# Patient Record
Sex: Male | Born: 1940 | Race: White | Hispanic: No | Marital: Married | Smoking: Never smoker
Health system: Southern US, Community
[De-identification: ages and names within clinical notes are randomized; demographics above are authoritative.]

## PROBLEM LIST (undated history)

## (undated) DIAGNOSIS — F32A Depression, unspecified: Secondary | ICD-10-CM

## (undated) DIAGNOSIS — R011 Cardiac murmur, unspecified: Secondary | ICD-10-CM

## (undated) DIAGNOSIS — F209 Schizophrenia, unspecified: Secondary | ICD-10-CM

## (undated) DIAGNOSIS — F419 Anxiety disorder, unspecified: Secondary | ICD-10-CM

## (undated) DIAGNOSIS — I350 Nonrheumatic aortic (valve) stenosis: Secondary | ICD-10-CM

## (undated) DIAGNOSIS — E785 Hyperlipidemia, unspecified: Secondary | ICD-10-CM

## (undated) DIAGNOSIS — F329 Major depressive disorder, single episode, unspecified: Secondary | ICD-10-CM

## (undated) DIAGNOSIS — I2510 Atherosclerotic heart disease of native coronary artery without angina pectoris: Secondary | ICD-10-CM

## (undated) HISTORY — DX: Nonrheumatic aortic (valve) stenosis: I35.0

## (undated) HISTORY — PX: LIPOMA EXCISION: SHX5283

## (undated) HISTORY — PX: FOOT SURGERY: SHX648

## (undated) HISTORY — DX: Hyperlipidemia, unspecified: E78.5

## (undated) HISTORY — DX: Atherosclerotic heart disease of native coronary artery without angina pectoris: I25.10

## (undated) HISTORY — PX: EYE SURGERY: SHX253

---
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
## 2008-06-24 ENCOUNTER — Emergency Department (HOSPITAL_COMMUNITY): Admission: EM | Admit: 2008-06-24 | Discharge: 2008-06-24 | Payer: Self-pay | Admitting: Family Medicine

## 2017-05-27 DIAGNOSIS — R011 Cardiac murmur, unspecified: Secondary | ICD-10-CM | POA: Diagnosis not present

## 2017-05-27 DIAGNOSIS — R29898 Other symptoms and signs involving the musculoskeletal system: Secondary | ICD-10-CM | POA: Diagnosis not present

## 2017-05-27 DIAGNOSIS — F429 Obsessive-compulsive disorder, unspecified: Secondary | ICD-10-CM | POA: Diagnosis not present

## 2017-05-27 DIAGNOSIS — R5383 Other fatigue: Secondary | ICD-10-CM | POA: Diagnosis not present

## 2017-05-27 DIAGNOSIS — D7589 Other specified diseases of blood and blood-forming organs: Secondary | ICD-10-CM | POA: Diagnosis not present

## 2017-05-27 DIAGNOSIS — R69 Illness, unspecified: Secondary | ICD-10-CM | POA: Diagnosis not present

## 2017-06-18 DIAGNOSIS — R69 Illness, unspecified: Secondary | ICD-10-CM | POA: Diagnosis not present

## 2017-07-02 ENCOUNTER — Emergency Department (HOSPITAL_COMMUNITY): Payer: Medicare HMO

## 2017-07-02 ENCOUNTER — Encounter (HOSPITAL_COMMUNITY): Payer: Self-pay | Admitting: Emergency Medicine

## 2017-07-02 ENCOUNTER — Emergency Department (HOSPITAL_COMMUNITY)
Admission: EM | Admit: 2017-07-02 | Discharge: 2017-07-02 | Disposition: A | Discharge status: Home / Self Care | Payer: Medicare HMO | Attending: Emergency Medicine | Admitting: Emergency Medicine

## 2017-07-02 DIAGNOSIS — Z79899 Other long term (current) drug therapy: Secondary | ICD-10-CM | POA: Diagnosis not present

## 2017-07-02 DIAGNOSIS — K5641 Fecal impaction: Secondary | ICD-10-CM | POA: Diagnosis not present

## 2017-07-02 DIAGNOSIS — K5903 Drug induced constipation: Secondary | ICD-10-CM | POA: Diagnosis not present

## 2017-07-02 DIAGNOSIS — K5900 Constipation, unspecified: Secondary | ICD-10-CM | POA: Diagnosis not present

## 2017-07-02 HISTORY — DX: Major depressive disorder, single episode, unspecified: F32.9

## 2017-07-02 HISTORY — DX: Anxiety disorder, unspecified: F41.9

## 2017-07-02 HISTORY — DX: Depression, unspecified: F32.A

## 2017-07-02 MED ORDER — SORBITOL 70 % SOLN
960.0000 mL | TOPICAL_OIL | Freq: Once | ORAL | Status: AC
  Administered 2017-07-02: 960 mL via RECTAL
  Filled 2017-07-02: qty 240

## 2017-07-02 MED ORDER — POLYETHYLENE GLYCOL 3350 17 G PO PACK: 17.0000 g | PACK | Freq: Every day | ORAL | 1 refills | Status: DC

## 2017-07-02 MED ORDER — DOCUSATE SODIUM 100 MG PO CAPS: 100.0000 mg | ORAL_CAPSULE | Freq: Two times a day (BID) | ORAL | 0 refills | Status: DC

## 2017-07-02 NOTE — ED Provider Notes (Signed)
[facility] DEPT Provider Note   CSN: [id number] Arrival date & time: 07/02/17  1035     History   Chief Complaint Chief Complaint  Patient presents with  . Fecal Impaction    HPI Neil [name] is a 76 y.o. male.  HPI Patient reports about a month ago he started taking amitriptyline and    X. he reports since then he's been having problems with constipation for about 2 weeks. He reports that he has tried to have a bowel movement and been "stopped up". Last week he wore gloves and was able to self disimpact and go on to have a very large bowel movement. He reports he has not been able to do that this time and he cannot give the stool to dislodge. He reports he has rectal pain but no abdominal pain, no nausea, no vomiting. He reports he is urinating normally. No incontinence. No pain or burning. Patient denies being on any blood thinners. Past Medical History:  Diagnosis Date  . Anxiety   . Depression     There are no active problems to display for this patient.   Past Surgical History:  Procedure Laterality Date  . FOOT SURGERY Left        Home Medications    Prior to Admission medications   Medication Sig Start Date End Date Taking? Authorizing Provider  amitriptyline (ELAVIL) 25 MG tablet Take 25 mg by mouth at bedtime.   Yes [provider]  Multiple Vitamin (MULTIVITAMIN WITH MINERALS) TABS tablet Take 1 tablet by mouth daily.   Yes [provider]  omega-3 acid ethyl esters (LOVAZA) 1 g capsule Take 1 g by mouth daily.   Yes [provider]  perphenazine (TRILAFON) 2 MG tablet Take 3 mg by mouth 2 (two) times daily.   Yes [provider]  docusate sodium (COLACE) 100 MG capsule Take 1 capsule (100 mg total) by mouth every 12 (twelve) hours. 07/02/17   Charlesetta Shanks, MD  polyethylene glycol (MIRALAX / GLYCOLAX) packet Take 17 g by mouth daily. 07/02/17   Charlesetta Shanks, MD    Family History No family history on  file.  Social History Social History  Substance Use Topics  . Smoking status: Never Smoker  . Smokeless tobacco: Not on file  . Alcohol use No     Allergies   Aspirin; Sulfur; and Tetanus toxoids   Review of Systems Review of Systems 10 Systems reviewed and are negative for acute change except as noted in the HPI.   Physical Exam Updated Vital Signs BP 137/68 (BP Location: Right Arm)   Pulse 90   Temp 98.6 F (37 C) (Oral)   Resp 18   Ht 5\' 5"  (1.651 m)   Wt 63 kg (139 lb)   SpO2 99%   BMI 23.13 kg/m   Physical Exam  Constitutional: He is oriented to person, place, and time. He appears well-developed and well-nourished.  HENT:  Head: Normocephalic and atraumatic.  Eyes: Conjunctivae are normal.  Neck: Neck supple.  Cardiovascular: Normal rate and regular rhythm.   Murmur heard. 3\6 systolic ejection murmur.  Pulmonary/Chest: Effort normal and breath sounds normal. No respiratory distress.  Abdominal: Soft. Bowel sounds are normal. He exhibits no distension. There is no tenderness.  Genitourinary:  Genitourinary Comments: Normal perianal tissues. Rectum is full with firm, impacted stool.  Musculoskeletal: Normal range of motion. He exhibits no edema.  Neurological: He is alert and oriented to person, place, and time. No cranial nerve  deficit. He exhibits normal muscle tone. Coordination normal.  Skin: Skin is warm and dry.  Psychiatric: He has a normal mood and affect.  Nursing note and vitals reviewed.    ED Treatments / Results  Labs (all labs ordered are listed, but only abnormal results are displayed) Labs Reviewed - No data to display  EKG  EKG Interpretation None       Radiology Dg Abd Acute W/chest  Result Date: 07/02/2017 CLINICAL DATA:  Constipation for 2-3 days. EXAM: DG ABDOMEN ACUTE W/ 1V CHEST COMPARISON:  None. FINDINGS: There is no evidence of dilated bowel loops or free intraperitoneal air. Moderate amount of formed stool throughout  the colon. No radiopaque calculi or other significant radiographic abnormality is seen. Heart size and mediastinal contours are within normal limits. Calcific atherosclerotic disease of the aorta. Both lungs are clear. IMPRESSION: Constipation. Nonobstructive bowel gas pattern. Calcific atherosclerotic disease of the aorta. Electronically Signed   By: Fidela Salisbury M.D.   On: 07/02/2017 12:37    Procedures Fecal disimpaction Date/Time: 07/02/2017 2:10 PM Performed by: Charlesetta Shanks Authorized by: Charlesetta Shanks  Consent: Verbal consent obtained. Comments: Patient was first digitally disimpacted with surgilube. Hard stool removed. Subsequently, smog enema instilled. Second manual disimpaction required. Again hard stool removed. At that time patient was able to be transitioned to a bedside commode and passed very large stool. It was small amount of bright red bleeding after disimpaction. No brisk bleeding or dripping of blood. No bleeding after completion of full bowel movement. Patient felt much improved. He tolerated procedure well.    (including critical care time)  Medications Ordered in ED Medications  sorbitol, milk of mag, mineral oil, glycerin (SMOG) enema (960 mLs Rectal Given 07/02/17 1313)     Initial Impression / Assessment and Plan / ED Course  I have reviewed the triage vital signs and the nursing notes.  Pertinent labs & imaging results that were available during my care of the patient were reviewed by me and considered in my medical decision making (see chart for details).     Final Clinical Impressions(s) / ED Diagnoses   Final diagnoses:  Fecal impaction ([place])  Drug-induced constipation  Patient counseled on ongoing management of constipation with daily Colace and MiraLAX every third day if he is not having regular bowel movements. patient is counseled on following up with PCP. Patient is alert and appropriate. He shows no signs of mental confusion or psychosis.  He is cognitively sharp. He advises he is placed on the combination of amitriptyline and Trilafon for severe depression he was having. He reports is a combination he had been on previously as well and done well. I did advise the patient these medications do have a significant side effect profile and are not in common use. At this time, I do not appreciate patient is showing signs of acute side effects. I did suggest he consider an evaluation with psychiatry as well to evaluate his medications and needs for management of depression, which is not currently clinically evident at this emergency department visit.  New Prescriptions New Prescriptions   DOCUSATE SODIUM (COLACE) 100 MG CAPSULE    Take 1 capsule (100 mg total) by mouth every 12 (twelve) hours.   POLYETHYLENE GLYCOL (MIRALAX / GLYCOLAX) PACKET    Take 17 g by mouth daily.     Charlesetta Shanks, MD 07/02/17 [phone]

## 2017-07-02 NOTE — Discharge Instructions (Signed)
Take Colace twice daily every day. Start taking MiraLAX every third day if you have not had a normal bowel movement. Continue the MiraLAX on a daily basis until you are having normal bowel movements again and then returned to Colace only.

## 2017-07-02 NOTE — ED Notes (Signed)
Pt ambulatory and independent at discharge.  Verbalized understanding of discharge instructions 

## 2017-07-02 NOTE — ED Notes (Signed)
Pt reports constipation, LBM-3 days ago.  States he takes meds that cause constipation.  Denies passing flatus.  Bowel sounds audible in all quadrants.  Denies any n/v and has not tried OTC stool softeners or laxatives.  Pt reports rectal pain, has the urge to have a BM but is unable to move his bowels.

## 2017-07-02 NOTE — ED Triage Notes (Signed)
Pt states that his anxiety medication constipates him and he has been able to disimpact himself and use suppositories at home in the past 3 weeks but feels impacted at this time. Alert and oriented.

## 2017-07-09 DIAGNOSIS — R69 Illness, unspecified: Secondary | ICD-10-CM | POA: Diagnosis not present

## 2017-07-09 DIAGNOSIS — F428 Other obsessive-compulsive disorder: Secondary | ICD-10-CM | POA: Diagnosis not present

## 2017-07-09 DIAGNOSIS — R011 Cardiac murmur, unspecified: Secondary | ICD-10-CM | POA: Diagnosis not present

## 2017-07-09 DIAGNOSIS — K5903 Drug induced constipation: Secondary | ICD-10-CM | POA: Diagnosis not present

## 2017-07-29 DIAGNOSIS — R69 Illness, unspecified: Secondary | ICD-10-CM | POA: Diagnosis not present

## 2017-08-19 DIAGNOSIS — R69 Illness, unspecified: Secondary | ICD-10-CM | POA: Diagnosis not present

## 2017-09-02 DIAGNOSIS — R69 Illness, unspecified: Secondary | ICD-10-CM | POA: Diagnosis not present

## 2017-09-30 DIAGNOSIS — R69 Illness, unspecified: Secondary | ICD-10-CM | POA: Diagnosis not present

## 2017-10-07 DIAGNOSIS — R69 Illness, unspecified: Secondary | ICD-10-CM | POA: Diagnosis not present

## 2017-10-21 DIAGNOSIS — H524 Presbyopia: Secondary | ICD-10-CM | POA: Diagnosis not present

## 2017-10-28 DIAGNOSIS — R69 Illness, unspecified: Secondary | ICD-10-CM | POA: Diagnosis not present

## 2017-10-29 DIAGNOSIS — E782 Mixed hyperlipidemia: Secondary | ICD-10-CM | POA: Diagnosis not present

## 2017-10-29 DIAGNOSIS — M20039 Swan-neck deformity of unspecified finger(s): Secondary | ICD-10-CM | POA: Diagnosis not present

## 2017-10-29 DIAGNOSIS — N400 Enlarged prostate without lower urinary tract symptoms: Secondary | ICD-10-CM | POA: Diagnosis not present

## 2017-10-29 DIAGNOSIS — Z0000 Encounter for general adult medical examination without abnormal findings: Secondary | ICD-10-CM | POA: Diagnosis not present

## 2017-10-29 DIAGNOSIS — R011 Cardiac murmur, unspecified: Secondary | ICD-10-CM | POA: Diagnosis not present

## 2017-10-29 DIAGNOSIS — G4700 Insomnia, unspecified: Secondary | ICD-10-CM | POA: Diagnosis not present

## 2017-10-29 DIAGNOSIS — R7303 Prediabetes: Secondary | ICD-10-CM | POA: Diagnosis not present

## 2017-10-29 DIAGNOSIS — Z1211 Encounter for screening for malignant neoplasm of colon: Secondary | ICD-10-CM | POA: Diagnosis not present

## 2017-10-29 DIAGNOSIS — R69 Illness, unspecified: Secondary | ICD-10-CM | POA: Diagnosis not present

## 2017-10-29 DIAGNOSIS — Z125 Encounter for screening for malignant neoplasm of prostate: Secondary | ICD-10-CM | POA: Diagnosis not present

## 2017-11-25 DIAGNOSIS — R69 Illness, unspecified: Secondary | ICD-10-CM | POA: Diagnosis not present

## 2017-12-23 DIAGNOSIS — R69 Illness, unspecified: Secondary | ICD-10-CM | POA: Diagnosis not present

## 2018-01-31 DIAGNOSIS — H6092 Unspecified otitis externa, left ear: Secondary | ICD-10-CM | POA: Diagnosis not present

## 2018-02-17 DIAGNOSIS — R69 Illness, unspecified: Secondary | ICD-10-CM | POA: Diagnosis not present

## 2018-03-17 DIAGNOSIS — R69 Illness, unspecified: Secondary | ICD-10-CM | POA: Diagnosis not present

## 2018-03-31 DIAGNOSIS — R69 Illness, unspecified: Secondary | ICD-10-CM | POA: Diagnosis not present

## 2018-04-14 DIAGNOSIS — R69 Illness, unspecified: Secondary | ICD-10-CM | POA: Diagnosis not present

## 2018-06-17 DIAGNOSIS — R69 Illness, unspecified: Secondary | ICD-10-CM | POA: Diagnosis not present

## 2018-08-11 DIAGNOSIS — R69 Illness, unspecified: Secondary | ICD-10-CM | POA: Diagnosis not present

## 2018-09-22 DIAGNOSIS — R69 Illness, unspecified: Secondary | ICD-10-CM | POA: Diagnosis not present

## 2018-10-06 ENCOUNTER — Ambulatory Visit (INDEPENDENT_AMBULATORY_CARE_PROVIDER_SITE_OTHER): Payer: Medicare HMO | Admitting: Psychiatry

## 2018-10-06 DIAGNOSIS — G2401 Drug induced subacute dyskinesia: Secondary | ICD-10-CM

## 2018-10-06 DIAGNOSIS — F29 Unspecified psychosis not due to a substance or known physiological condition: Secondary | ICD-10-CM

## 2018-10-06 DIAGNOSIS — R69 Illness, unspecified: Secondary | ICD-10-CM | POA: Diagnosis not present

## 2018-10-06 MED ORDER — PERPHENAZINE 2 MG PO TABS: 5.0000 mg | ORAL_TABLET | Freq: Every day | ORAL | 0 refills | Status: DC

## 2018-10-06 MED ORDER — MIRTAZAPINE 15 MG PO TABS: 15.0000 mg | ORAL_TABLET | Freq: Every day | ORAL | 1 refills | Status: DC

## 2018-10-06 NOTE — Progress Notes (Addendum)
We don't have records from Dr. Sharalyn Ink. Or. Dr. Deboraha Sprang Med Check  Patient ID: Neil [name],  MRN: [id number]  PCP: Antony Contras, MD  Date of Evaluation: 10/06/2018 Time spent:20 minutes   HISTORY/CURRENT STATUS: HPI patient is a 77 year old white male last seen 08/11/2018.  He carries diagnosis of psychosis NOS, TD.  He was having some varying depression that time.  Denies suicidal thoughts.  No hallucinations or delusions. Some paranoia as he always feels guilty about seeing an illusion 1 yr ago(lady at church) for 1 time only. He knows the guilt is irrational. Still with mild depression.  No symptoms of tardive kinesia.  Individual Medical History/ Review of Systems: Changes? :No  Allergies: Aspirin; Sulfur; and Tetanus toxoids  Current Medications:  Current Outpatient Medications:  .  docusate sodium (COLACE) 100 MG capsule, Take 1 capsule (100 mg total) by mouth every 12 (twelve) hours., Disp: 60 capsule, Rfl: 0 .  mirtazapine (REMERON) 15 MG tablet, Take 1 tablet (15 mg total) by mouth at bedtime., Disp: 30 tablet, Rfl: 1 .  Multiple Vitamin (MULTIVITAMIN WITH MINERALS) TABS tablet, Take 1 tablet by mouth daily., Disp: , Rfl:  .  omega-3 acid ethyl esters (LOVAZA) 1 g capsule, Take 1 g by mouth daily., Disp: , Rfl:  .  perphenazine (TRILAFON) 2 MG tablet, Take 2.5 tablets (5 mg total) by mouth at bedtime., Disp: 75 tablet, Rfl: 0 .  Valbenazine Tosylate (INGREZZA) 40 MG CAPS, Take 40 mg by mouth daily., Disp: , Rfl:  .  amitriptyline (ELAVIL) 25 MG tablet, Take 25 mg by mouth at bedtime., Disp: , Rfl:  .  hydrOXYzine (ATARAX/VISTARIL) 25 MG tablet, Take 25 mg by mouth 3 (three) times daily., Disp: , Rfl:  .  polyethylene glycol (MIRALAX / GLYCOLAX) packet, Take 17 g by mouth daily. (Patient not taking: Reported on 10/06/2018), Disp: 14 each, Rfl: 1 Medication Side Effects: None  Family Medical/ Social History: Changes? No  MENTAL HEALTH EXAM:  There  were no vitals taken for this visit.There is no height or weight on file to calculate BMI.  General Appearance: Casual  Eye Contact:  Good  Speech:  Normal Rate  Volume:  Normal  Mood:  Anxious  Affect:  Appropriate  Thought Process:  Linear  Orientation:  Full (Time, Place, and Person)  Thought Content: Logical   Suicidal Thoughts:  No  Homicidal Thoughts:  no  Memory:  normal  Judgement:  Fair  Insight:  Fair  Psychomotor Activity:  Normal  Concentration:  Concentration: Good  Recall:  Good  Fund of Knowledge: Good  Language: Good  Akathisia:  No  AIMS (if indicated): negative  Assets:  Desire for Improvement  ADL's:  Intact  Cognition: WNL  Prognosis:  Good    DIAGNOSES:    ICD-10-CM   1. 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 Tardive dyskinesia G24.01     RECOMMENDATIONS: discussed pt with Dr. Clovis Pu. Feel his prolonged guilt is psychosis. Pt to increase his triafon from 2mg   2 day to 2 mg 21/2/d. Continue remeron 15mg  hs. Continue Ingrezza 40mg /day. States is mailed to  [employer] in 1 month.  Comer Locket, PA-C

## 2018-11-03 ENCOUNTER — Ambulatory Visit: Payer: Medicare HMO | Admitting: Psychiatry

## 2018-11-03 DIAGNOSIS — G4700 Insomnia, unspecified: Secondary | ICD-10-CM | POA: Diagnosis not present

## 2018-11-03 DIAGNOSIS — Z0000 Encounter for general adult medical examination without abnormal findings: Secondary | ICD-10-CM | POA: Diagnosis not present

## 2018-11-03 DIAGNOSIS — E782 Mixed hyperlipidemia: Secondary | ICD-10-CM | POA: Diagnosis not present

## 2018-11-03 DIAGNOSIS — R011 Cardiac murmur, unspecified: Secondary | ICD-10-CM | POA: Diagnosis not present

## 2018-11-03 DIAGNOSIS — R7303 Prediabetes: Secondary | ICD-10-CM | POA: Diagnosis not present

## 2018-11-03 DIAGNOSIS — Z1211 Encounter for screening for malignant neoplasm of colon: Secondary | ICD-10-CM | POA: Diagnosis not present

## 2018-11-03 DIAGNOSIS — N400 Enlarged prostate without lower urinary tract symptoms: Secondary | ICD-10-CM | POA: Diagnosis not present

## 2018-11-03 DIAGNOSIS — M20039 Swan-neck deformity of unspecified finger(s): Secondary | ICD-10-CM | POA: Diagnosis not present

## 2018-11-03 DIAGNOSIS — R69 Illness, unspecified: Secondary | ICD-10-CM | POA: Diagnosis not present

## 2018-11-03 DIAGNOSIS — Z125 Encounter for screening for malignant neoplasm of prostate: Secondary | ICD-10-CM | POA: Diagnosis not present

## 2018-11-04 ENCOUNTER — Ambulatory Visit: Payer: Medicare HMO | Admitting: Psychiatry

## 2018-11-04 DIAGNOSIS — G2401 Drug induced subacute dyskinesia: Secondary | ICD-10-CM

## 2018-11-04 DIAGNOSIS — F22 Delusional disorders: Secondary | ICD-10-CM | POA: Diagnosis not present

## 2018-11-04 DIAGNOSIS — R69 Illness, unspecified: Secondary | ICD-10-CM | POA: Diagnosis not present

## 2018-11-04 MED ORDER — MIRTAZAPINE 15 MG PO TABS: 15.0000 mg | ORAL_TABLET | Freq: Every day | ORAL | 1 refills | Status: DC

## 2018-11-04 MED ORDER — PERPHENAZINE 2 MG PO TABS: 2.0000 mg | ORAL_TABLET | Freq: Three times a day (TID) | ORAL | 1 refills | Status: DC

## 2018-11-04 NOTE — Progress Notes (Signed)
[facility]  Patient ID: Neil [name],  MRN: [id number]  PCP: Antony Contras, MD  Date of Evaluation: 11/04/2018 Time spent:20 minutes  Chief Complaint:   HISTORY/CURRENT STATUS: HPI patient last seen 10/06/2018.  Carries a diagnosis of psychosis, tardive dyskinesia.  His psychosis (paranoia) is only slightly better.  Takes the form of guilt feeling of something that happened a year ago.  He denies delusions or hallucinations.  He denies symptoms of tardive dyskinesia. He has some depression but no suicidal thoughts.  He has anxiety at night and during the day.  He goes to bed at 7 pM and wakes up at 3 AM.  Wakes up with anxiety.  Individual Medical History/ Review of Systems: Changes? :No   Allergies: Aspirin; Sulfur; and Tetanus toxoids  Current Medications:  Current Outpatient Medications:  .  docusate sodium (COLACE) 100 MG capsule, Take 1 capsule (100 mg total) by mouth every 12 (twelve) hours., Disp: 60 capsule, Rfl: 0 .  mirtazapine (REMERON) 15 MG tablet, Take 1 tablet (15 mg total) by mouth at bedtime., Disp: 30 tablet, Rfl: 1 .  Valbenazine Tosylate (INGREZZA) 40 MG CAPS, Take 40 mg by mouth daily., Disp: , Rfl:  .  amitriptyline (ELAVIL) 25 MG tablet, Take 25 mg by mouth at bedtime., Disp: , Rfl:  .  hydrOXYzine (ATARAX/VISTARIL) 25 MG tablet, Take 25 mg by mouth 3 (three) times daily., Disp: , Rfl:  .  Multiple Vitamin (MULTIVITAMIN WITH MINERALS) TABS tablet, Take 1 tablet by mouth daily., Disp: , Rfl:  .  omega-3 acid ethyl esters (LOVAZA) 1 g capsule, Take 1 g by mouth daily., Disp: , Rfl:  .  perphenazine (TRILAFON) 2 MG tablet, Take 1 tablet (2 mg total) by mouth 3 (three) times daily., Disp: 90 tablet, Rfl: 1 .  polyethylene glycol (MIRALAX / GLYCOLAX) packet, Take 17 g by mouth daily. (Patient not taking: Reported on 10/06/2018), Disp: 14 each, Rfl: 1 Medication Side Effects: none   Family Medical/ Social History: Changes? no  MENTAL HEALTH  EXAM:  There were no vitals taken for this visit.There is no height or weight on file to calculate BMI.  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 Tardive dyskinesia G24.01     Receiving Psychotherapy: No    RECOMMENDATIONS: We will increase his Trilafon to 2 mg 3 a day.  He gets [place] from [place] and Trilafon and Remeron from [facility].  Will follow his anxiety and his scale.  Recheck 6 weeks.   Comer Locket, PA-C

## 2018-11-07 ENCOUNTER — Other Ambulatory Visit: Payer: Self-pay

## 2018-11-08 ENCOUNTER — Other Ambulatory Visit: Payer: Self-pay

## 2018-11-18 ENCOUNTER — Telehealth: Payer: Self-pay | Admitting: Psychiatry

## 2018-11-18 ENCOUNTER — Other Ambulatory Visit: Payer: Self-pay | Admitting: Psychiatry

## 2018-11-18 NOTE — Telephone Encounter (Signed)
Patient wants med for anxiety. Has used vistaril in past. Called his home but no answering machine. I will have nurse to call and make sure no problems with v istaril in past.

## 2018-11-21 ENCOUNTER — Telehealth: Payer: Self-pay | Admitting: Psychiatry

## 2018-11-21 ENCOUNTER — Other Ambulatory Visit: Payer: Self-pay | Admitting: Psychiatry

## 2018-11-21 MED ORDER — BUSPIRONE HCL 10 MG PO TABS: ORAL_TABLET | ORAL | 0 refills | Status: DC

## 2018-11-21 NOTE — Progress Notes (Signed)
eprescribed buspar 10mg   1/2 day for a week, then 1 a day.

## 2018-11-21 NOTE — Telephone Encounter (Signed)
Pt requesting something for anxiety. Will start buspar 10mg /day   1/2 tab daily for a week , then 1 tab daily.

## 2018-12-16 ENCOUNTER — Other Ambulatory Visit: Payer: Self-pay

## 2018-12-16 NOTE — Telephone Encounter (Signed)
Opened chart in error.

## 2018-12-22 ENCOUNTER — Ambulatory Visit (INDEPENDENT_AMBULATORY_CARE_PROVIDER_SITE_OTHER): Payer: Medicare HMO | Admitting: Psychiatry

## 2018-12-22 DIAGNOSIS — R69 Illness, unspecified: Secondary | ICD-10-CM | POA: Diagnosis not present

## 2018-12-22 DIAGNOSIS — F259 Schizoaffective disorder, unspecified: Secondary | ICD-10-CM | POA: Diagnosis not present

## 2018-12-22 MED ORDER — PERPHENAZINE 2 MG PO TABS: ORAL_TABLET | ORAL | 0 refills | Status: DC

## 2018-12-22 MED ORDER — BUSPIRONE HCL 10 MG PO TABS: ORAL_TABLET | ORAL | 0 refills | Status: DC

## 2018-12-22 MED ORDER — MIRTAZAPINE 15 MG PO TABS: ORAL_TABLET | ORAL | 0 refills | Status: DC

## 2018-12-22 NOTE — Progress Notes (Signed)
[facility]  Patient ID: Neil [name],  MRN: [id number]  PCP: Antony Contras, MD  Date of Evaluation: 12/22/2018 Time spent:30 minutes  Chief Complaint:   HISTORY/CURRENT STATUS: HPI patient was last seen 11/19.  He was still having paranoia at the time.  We increased his Trilafon. Patient states wife feels he is doing worse having a lot of paranoia.  The patient describes paranoia/guilt.  There was an incident about a year ago that bothered the patient in church and he has not been able to let go of it.  He feels more people are aware of it.  He denies delusions or hallucinations.Marland Kitchen He is having anxiety. He does have some finger tremors in his left hand off and on during the day lasting 20 seconds apiece.  Individual Medical History/ Review of Systems: Changes? :No   Allergies: Aspirin; Sulfur; and Tetanus toxoids  Current Medications:  Current Outpatient Medications:  .  busPIRone (BUSPAR) 10 MG tablet, 1 a day, Disp: 30 tablet, Rfl: 0 .  mirtazapine (REMERON) 15 MG tablet, 2 at night, Disp: 60 tablet, Rfl: 0 .  perphenazine (TRILAFON) 2 MG tablet, 2 tabs bid, Disp: 120 tablet, Rfl: 0 .  Valbenazine Tosylate (INGREZZA) 40 MG CAPS, Take 40 mg by mouth daily., Disp: , Rfl:  .  amitriptyline (ELAVIL) 25 MG tablet, Take 25 mg by mouth at bedtime., Disp: , Rfl:  .  docusate sodium (COLACE) 100 MG capsule, Take 1 capsule (100 mg total) by mouth every 12 (twelve) hours., Disp: 60 capsule, Rfl: 0 .  hydrOXYzine (ATARAX/VISTARIL) 25 MG tablet, Take 25 mg by mouth 3 (three) times daily., Disp: , Rfl:  .  Multiple Vitamin (MULTIVITAMIN WITH MINERALS) TABS tablet, Take 1 tablet by mouth daily., Disp: , Rfl:  .  omega-3 acid ethyl esters (LOVAZA) 1 g capsule, Take 1 g by mouth daily., Disp: , Rfl:  .  polyethylene glycol (MIRALAX / GLYCOLAX) packet, Take 17 g by mouth daily. (Patient not taking: Reported on 10/06/2018), Disp: 14 each, Rfl: 1 Medication Side Effects: none  Family  Medical/ Social History: Changes? No  MENTAL HEALTH EXAM:  There were no vitals taken for this visit.There is no height or weight on file to calculate BMI.  General Appearance: Casual  Eye Contact:  Good  Speech:  Normal Rate at times pauses for 5 seconds  Volume:  Normal  Mood:  Depressed guilty  Affect:  Appropriate  Thought Process:  Linear  Orientation:  Full (Time, Place, and Person)  Thought Content: Paranoid Ideation   Suicidal Thoughts:  No  Homicidal Thoughts:  No  Memory:  WNL  Judgement:  fair  Insight:  Fair  Psychomotor Activity:  Normal  Concentration:  Concentration: Good  Recall:  Good  Fund of Knowledge: Good  Language: Good  Assets:  Desire for Improvement  ADL's:  Intact  Cognition: WNL  Prognosis:  Fair  No symptoms of tar dive dyskinesia.  DIAGNOSES:    ICD-10-CM   1. Schizoaffective disorder, unspecified type ([place]) F25.9 perphenazine (TRILAFON) 2 MG tablet    Receiving Psychotherapy: no declines counseling   RECOMMENDATIONS: We will increase the patient's Remeron from 15 mg at night to 30 mg at night.  In addition to help with anxiety should help with his insomnia.  Increase his Trilafon to 2 mg 4 a day.  He can take these 2 in the morning and 2 at night.  BuSpar 10 mg a day.  Continue Ingrezza 40 mg a day.  Comer Locket, PA-C

## 2018-12-26 ENCOUNTER — Other Ambulatory Visit: Payer: Self-pay

## 2018-12-26 ENCOUNTER — Telehealth: Payer: Self-pay | Admitting: Psychiatry

## 2018-12-26 MED ORDER — MIRTAZAPINE 30 MG PO TABS: ORAL_TABLET | ORAL | 1 refills | Status: DC

## 2018-12-26 NOTE — Telephone Encounter (Signed)
Pt worried about constipation if increases trilafon to 2 mg 2 bid. Encouraged to try as may help with paranoia. Also advised to see neurologist for blanking out spells. Pt states he probably won't see a neurologist

## 2018-12-26 NOTE — Telephone Encounter (Signed)
PATIENT WANTS TO DISCUSS AN INCREASE ON PERHENAZINE [phone]

## 2018-12-28 ENCOUNTER — Other Ambulatory Visit: Payer: Self-pay

## 2018-12-28 MED ORDER — MIRTAZAPINE 30 MG PO TABS: ORAL_TABLET | ORAL | 1 refills | Status: DC

## 2019-01-02 ENCOUNTER — Telehealth: Payer: Self-pay | Admitting: Psychiatry

## 2019-01-02 NOTE — Telephone Encounter (Signed)
Increase fluids and fiber foods.

## 2019-01-02 NOTE — Telephone Encounter (Signed)
Please advise 

## 2019-01-02 NOTE — Telephone Encounter (Signed)
Pt called he is having constipation issues with meds he's taking. Please advise.

## 2019-01-03 NOTE — Telephone Encounter (Signed)
Given information and some suggestions. Instructed not to adjust his medications, keep at same doses as prescribed. Follow up on 02/06

## 2019-01-03 NOTE — Telephone Encounter (Signed)
Left voice mail to call back 

## 2019-01-06 ENCOUNTER — Telehealth: Payer: Self-pay

## 2019-01-06 NOTE — Telephone Encounter (Signed)
Prior Auth approved by [employer] for [employer] 12/12/2018-12/14/2019.

## 2019-01-19 ENCOUNTER — Ambulatory Visit: Payer: Medicare HMO | Admitting: Psychiatry

## 2019-01-23 ENCOUNTER — Ambulatory Visit: Payer: Medicare HMO | Admitting: Psychiatry

## 2019-01-23 DIAGNOSIS — F200 Paranoid schizophrenia: Secondary | ICD-10-CM | POA: Diagnosis not present

## 2019-01-23 DIAGNOSIS — R69 Illness, unspecified: Secondary | ICD-10-CM | POA: Diagnosis not present

## 2019-01-23 MED ORDER — OLANZAPINE 5 MG PO TABS: ORAL_TABLET | ORAL | 1 refills | Status: DC

## 2019-01-23 NOTE — Progress Notes (Signed)
[facility]  Patient ID: Neil [name],  MRN: [id number]  PCP: Antony Contras, MD  Date of Evaluation: 01/23/2019 Time spent:20 minutes  Chief Complaint:   HISTORY/CURRENT STATUS: HPI patient was seen 1 month ago.  He was having paranoid ideations.incident that happened at church a year ago.  We increased his Remeron to 30 mg a day increased his Trilafon 2 mg to 4 a day continue BuSpar.  Continue Ingrezza. Currently the patient continues to have nausea.  It stems around an event happened at church a year ago.  Where she is thought he saw an illusion of the lady.  He is still worried about that.  Wants to know if he send.  He did have a little bit hallucinations while seeing blocks.   Individual Medical History/ Review of Systems: Changes? :No   Allergies: Aspirin; Sulfur; and Tetanus toxoids  Current Medications:  Current Outpatient Medications:  .  mirtazapine (REMERON) 30 MG tablet, One at hs, Disp: 30 tablet, Rfl: 1 .  perphenazine (TRILAFON) 2 MG tablet, 2 tabs bid, Disp: 120 tablet, Rfl: 0 .  amitriptyline (ELAVIL) 25 MG tablet, Take 25 mg by mouth at bedtime., Disp: , Rfl:  .  busPIRone (BUSPAR) 10 MG tablet, 1 a day (Patient not taking: Reported on 01/23/2019), Disp: 30 tablet, Rfl: 0 .  docusate sodium (COLACE) 100 MG capsule, Take 1 capsule (100 mg total) by mouth every 12 (twelve) hours., Disp: 60 capsule, Rfl: 0 .  hydrOXYzine (ATARAX/VISTARIL) 25 MG tablet, Take 25 mg by mouth 3 (three) times daily., Disp: , Rfl:  .  Multiple Vitamin (MULTIVITAMIN WITH MINERALS) TABS tablet, Take 1 tablet by mouth daily., Disp: , Rfl:  .  omega-3 acid ethyl esters (LOVAZA) 1 g capsule, Take 1 g by mouth daily., Disp: , Rfl:  .  polyethylene glycol (MIRALAX / GLYCOLAX) packet, Take 17 g by mouth daily. (Patient not taking: Reported on 10/06/2018), Disp: 14 each, Rfl: 1 .  Valbenazine Tosylate (INGREZZA) 40 MG CAPS, Take 40 mg by mouth daily., Disp: , Rfl:  Medication Side  Effects: none  Family Medical/ Social History: Changes? No  MENTAL HEALTH EXAM:  There were no vitals taken for this visit.There is no height or weight on file to calculate BMI.  General Appearance: Casual  Eye Contact:  Fair  Speech:  Clear and Coherent  Volume:  Normal  Mood:  Anxious and Depressed  Affect:  Appropriate  Thought Process:  Linear  Orientation:  Full (Time, Place, and Person)  Thought Content: at times during the interview he paused when asked a question  Suicidal Thoughts:  No  Homicidal Thoughts:  No  Memory:  WNL  Judgement:  Poor  Insight:  Lacking  Psychomotor Activity:  Normal  Concentration:  Concentration: Good  Recall:  Good  Fund of Knowledge: Good  Language: Good  Assets:  Desire for Improvement  ADL's:  Intact  Cognition: WNL  Prognosis:  Fair    DIAGNOSES:    ICD-10-CM   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  We will taper him off his Trilafon 2 mg a week.  He is only taken 4 mg now.  Start him on Zyprexa.  Zyprexa 5 mg one half a day for a week and then 1 a day.  He is to stop the Remeron.  Requests that he can call to make another appt. His finances  are tight   [employer], PA-C

## 2019-01-24 ENCOUNTER — Telehealth: Payer: Self-pay | Admitting: Psychiatry

## 2019-01-24 NOTE — Telephone Encounter (Signed)
Problem wioth tremors on risperdal.

## 2019-01-24 NOTE — Telephone Encounter (Signed)
Spoke with wife. Pt no better but not worsening Guilty, an d paranoid Will follow sx. On zyprexa

## 2019-01-30 ENCOUNTER — Other Ambulatory Visit: Payer: Self-pay | Admitting: Psychiatry

## 2019-02-14 ENCOUNTER — Other Ambulatory Visit: Payer: Self-pay | Admitting: Psychiatry

## 2019-02-21 ENCOUNTER — Ambulatory Visit: Payer: Medicare HMO | Admitting: Psychiatry

## 2019-03-02 ENCOUNTER — Ambulatory Visit: Payer: Medicare HMO | Admitting: Psychiatry

## 2019-03-02 ENCOUNTER — Other Ambulatory Visit: Payer: Self-pay

## 2019-03-02 ENCOUNTER — Encounter: Payer: Self-pay | Admitting: Psychiatry

## 2019-03-02 DIAGNOSIS — F333 Major depressive disorder, recurrent, severe with psychotic symptoms: Secondary | ICD-10-CM | POA: Diagnosis not present

## 2019-03-02 DIAGNOSIS — F411 Generalized anxiety disorder: Secondary | ICD-10-CM | POA: Diagnosis not present

## 2019-03-02 DIAGNOSIS — R69 Illness, unspecified: Secondary | ICD-10-CM | POA: Diagnosis not present

## 2019-03-02 DIAGNOSIS — G2401 Drug induced subacute dyskinesia: Secondary | ICD-10-CM | POA: Diagnosis not present

## 2019-03-02 DIAGNOSIS — K5904 Chronic idiopathic constipation: Secondary | ICD-10-CM

## 2019-03-02 MED ORDER — SERTRALINE HCL 25 MG PO TABS: ORAL_TABLET | ORAL | 1 refills | Status: DC

## 2019-03-02 NOTE — Patient Instructions (Signed)
Increase olanzapine  5mg   to 1 1/2 tablets  Stop mirtazapine  Start sertraline 1/2 tablet daily for 1 week then 1 daily for anxiety and depression

## 2019-03-02 NOTE — Progress Notes (Signed)
[name] [id number] [birth year] 78 y.o.  Subjective:   Patient ID:  Neil [name] is a 78 y.o. (DOB 1941-08-19) male.  Chief Complaint:  Chief Complaint  Patient presents with  . Follow-up    Medication Management  . Depression  . 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  He was tapered off Trilafon 4 mg and started on olanzapine 5.  Mirtazapine was stopped.  He was still having paranoid thoughts at that visit.  He was complaining of nausea.  Lack of hope for getting better emotionally.  Anytime I have a moral decision to make it bothers me.  I might make the wrong one.  Irrational guilt.  When I get on something it's repetitive but they switch every so often.  Lately it's when I go to church I feel like the pastor's aware of his situation and it bothers me.  He can't read patient's thoughts.  Things pastor said.  Wife think's it's false guilt.  Sx going on for 18 mos.  Happened one other time 15 years ago.  Trilafon helped at the time.  Sometimes afraid. Down is even worse than the anxiety.  Sleeping lots and hard time getting up for awhile, not just since olanzapine.  No SE, better than the perphenazine but not the best.  Emotionally a little better. Lost weight over time and appetite a little better.  Has heard an inner voice twice at church and has seen sensual kind of manifestation and he heard this thougts to himself, she wants people to look at her and then "let's make that happen".  Then saw 2 blocks and thought if I get the blocks to line up then I could make it happen.  Felt real joy momentarily.  Saw the woman return to her seat..  Happened 18 mos ago and started the current problem.  Hx constipation with meds.  Past Psychiatric Medication Trials: Mirtazapine, olanzapine 5, perphenazine, buspirone, Ingrezza, amitriptyline 25, risperidone 2 tremors  Review of Systems:   Review of Systems  Gastrointestinal: Positive for constipation.  Neurological: Positive for tremors and weakness.  Psychiatric/Behavioral: Positive for decreased concentration, dysphoric mood and sleep disturbance. Negative for agitation, behavioral problems, confusion and self-injury. The patient is nervous/anxious. The patient is not hyperactive.     Medications: I have reviewed the patient's current medications.  Current Outpatient Medications  Medication Sig Dispense Refill  . docusate sodium (COLACE) 100 MG capsule Take 1 capsule (100 mg total) by mouth every 12 (twelve) hours. 60 capsule 0  . mirtazapine (REMERON) 30 MG tablet TAKE 1 TABLET BY MOUTH AT BEDTIME (Patient taking differently: 15 mg. TAKE 1 TABLET BY MOUTH AT BEDTIME) 90 tablet 0  . Multiple Vitamin (MULTIVITAMIN WITH MINERALS) TABS tablet Take 1 tablet by mouth daily.    Marland Kitchen OLANZapine (ZYPREXA) 5 MG tablet TAKE 1/2 TABLET EVERY DAY FOR 7 DAYS THEN TAKE 1 TABLET EVERY DAY (Patient taking differently: No sig reported) 90 tablet 0  . omega-3 acid ethyl esters (LOVAZA) 1 g capsule Take 1 g by mouth daily.    Minus Liberty Tosylate (INGREZZA) 40 MG CAPS Take 40 mg by mouth daily.    . polyethylene glycol (MIRALAX / GLYCOLAX) packet Take 17 g by mouth daily. (Patient not taking: Reported on 10/06/2018) 14 each 1  . sertraline (ZOLOFT) 25 MG tablet 1/2 tablet daily for 1 week then 1 tablet  daily 30 tablet 1   No current facility-administered medications for this visit.     Medication Side Effects: None  Allergies:  Allergies  Allergen Reactions  . Aspirin Swelling and Other (See Comments)    Reaction:  Tongue swelling   . Sulfur Other (See Comments)    Reaction:  Unknown   . Tetanus Toxoids Swelling and Other (See Comments)    Reaction:  Arm swelling    Past Medical History:  Diagnosis Date  . Anxiety   . Depression     History reviewed. No pertinent family history.  Social History   Socioeconomic History   . Marital status: Married    Spouse name: Not on file  . Number of children: Not on file  . Years of education: Not on file  . Highest education level: Not on file  Occupational History  . Not on file  Social Needs  . Financial resource strain: Not on file  . Food insecurity:    Worry: Not on file    Inability: Not on file  . Transportation needs:    Medical: Not on file    Non-medical: Not on file  Tobacco Use  . Smoking status: Never Smoker  . Smokeless tobacco: Never Used  Substance and Sexual Activity  . Alcohol use: No  . Drug use: Not on file  . Sexual activity: Not on file  Lifestyle  . Physical activity:    Days per week: Not on file    Minutes per session: Not on file  . Stress: Not on file  Relationships  . Social connections:    Talks on phone: Not on file    Gets together: Not on file    Attends religious service: Not on file    Active member of club or organization: Not on file    Attends meetings of clubs or organizations: Not on file    Relationship status: Not on file  . Intimate partner violence:    Fear of current or ex partner: Not on file    Emotionally abused: Not on file    Physically abused: Not on file    Forced sexual activity: Not on file  Other Topics Concern  . Not on file  Social History Narrative  . Not on file    Past Medical History, Surgical history, Social history, and Family history were reviewed and updated as appropriate.   Please see review of systems for further details on the patient's review from today.   Objective:   Physical Exam:  There were no vitals taken for this visit.  Physical Exam Constitutional:      Appearance: Normal appearance.  Neurological:     Mental Status: He is lethargic.     Motor: Weakness present.     Gait: Gait normal.     Comments: Slow gait.  Decreased arm swing  Psychiatric:        Attention and Perception: He is attentive. He does not perceive auditory or visual hallucinations.         Mood and Affect: Mood is anxious and depressed.        Speech: Speech is delayed.        Behavior: Behavior is slowed.        Thought Content: Thought content is paranoid. Thought content does not include homicidal or suicidal ideation.        Cognition and Memory: Cognition is impaired.     Comments: Psychomotor retardation Fair insight and judgment.  Lab Review:  No results found for: NA, K, CL, CO2, GLUCOSE, BUN, CREATININE, CALCIUM, PROT, ALBUMIN, AST, ALT, ALKPHOS, BILITOT, GFRNONAA, GFRAA  No results found for: WBC, RBC, HGB, HCT, PLT, MCV, MCH, MCHC, RDW, LYMPHSABS, MONOABS, EOSABS, BASOSABS  No results found for: POCLITH, LITHIUM   No results found for: PHENYTOIN, PHENOBARB, VALPROATE, CBMZ   .res Assessment: Plan:    Severe recurrent major depression with psychotic features, mood-congruent (HCC)  Generalized anxiety disorder  Tardive dyskinesia  Chronic idiopathic constipation   Greater than 50% of face to face time with patient was spent on counseling and coordination of care. We discussed Patient has severe recurrent major depression with psychotic features.  There are also elements of OCD.  He is paranoid and severely depressed and anxious.  He is tolerating the olanzapine better than the perphenazine.  He is frustrated with his lack of progress over the last 18 months.  Discussed his depression.  We will start an antidepressant.  Specifically start a serotonin antidepressant which should also help with anxiety and any obsessiveness that is a feature.  He should not need the mirtazapine while taking the olanzapine as olanzapine will adequately address sleep and appetite issues.  Increase olanzapine  5mg   to 1 1/2 tablets  Stop mirtazapine  Start sertraline 1/2 tablet daily for 1 week then 1 daily for anxiety and depression  Continue Ingrezza for TD.  Discussed side effects of each medication.  Discussed potential metabolic side effects associated with atypical  antipsychotics, as well as potential risk for movement side effects. Advised pt to contact office if movement side effects occur.   Disc constipation: Constipation management 1.  Loss of water 2.  Powdered fiber supplement such as MiraLAX, Citrucel, etc. preferably with a meal 3.  2 stool softeners a day 4.  Senokot table daily 5.  Milk of magnesia or magnesium tablets if needed  This appt was 30 mins.  FU 6 weeks.  Lynder Parents, MD, DFAPA     Future Appointments  Date Time Provider [place]  04/13/2019 11:00 AM Cottle, Billey Co., MD [facility] None    No orders of the defined types were placed in this encounter.     -------------------------------

## 2019-03-26 ENCOUNTER — Other Ambulatory Visit: Payer: Self-pay | Admitting: Psychiatry

## 2019-04-13 ENCOUNTER — Ambulatory Visit: Payer: Medicare HMO | Admitting: Psychiatry

## 2019-04-27 ENCOUNTER — Other Ambulatory Visit: Payer: Self-pay

## 2019-04-27 ENCOUNTER — Other Ambulatory Visit: Payer: Self-pay | Admitting: Psychiatry

## 2019-04-27 ENCOUNTER — Encounter: Payer: Self-pay | Admitting: Psychiatry

## 2019-04-27 ENCOUNTER — Ambulatory Visit: Payer: Medicare HMO | Admitting: Psychiatry

## 2019-04-27 DIAGNOSIS — F411 Generalized anxiety disorder: Secondary | ICD-10-CM | POA: Diagnosis not present

## 2019-04-27 DIAGNOSIS — G2401 Drug induced subacute dyskinesia: Secondary | ICD-10-CM

## 2019-04-27 DIAGNOSIS — F333 Major depressive disorder, recurrent, severe with psychotic symptoms: Secondary | ICD-10-CM | POA: Diagnosis not present

## 2019-04-27 DIAGNOSIS — R69 Illness, unspecified: Secondary | ICD-10-CM | POA: Diagnosis not present

## 2019-04-27 MED ORDER — SERTRALINE HCL 25 MG PO TABS: ORAL_TABLET | ORAL | 0 refills | Status: DC

## 2019-04-27 MED ORDER — OLANZAPINE 10 MG PO TABS: 10.0000 mg | ORAL_TABLET | Freq: Every day | ORAL | 1 refills | Status: DC

## 2019-04-27 NOTE — Progress Notes (Signed)
Neil [name] [id number] [birth year] 78 y.o.   Virtual Visit via Telephone Note  I connected with pt by telephone and verified that I am speaking with the correct person using two identifiers.   I discussed the limitations, risks, security and privacy concerns of performing an evaluation and management service by telephone and the availability of in person appointments. I also discussed with the patient that there may be a patient responsible charge related to this service. The patient expressed understanding and agreed to proceed.  I discussed the assessment and treatment plan with the patient. The patient was provided an opportunity to ask questions and all were answered. The patient agreed with the plan and demonstrated an understanding of the instructions.   The patient was advised to call back or seek an in-person evaluation if the symptoms worsen or if the condition fails to improve as anticipated.  I provided 30 minutes of non-face-to-face time during this encounter. The call started at 1145 and ended at 1215. The patient was located at home and the provider was located office.   Subjective:   Patient ID:  Neil [name] is a 78 y.o. (DOB 1941-07-07) male.  Chief Complaint:  Chief Complaint  Patient presents with  . Follow-up    Medication Management  . Depression  . Anxiety    HPI [name] presents today for follow-up of psychosis, depression, and anxiety  Last visit March 02, 2019.  The following med changes were made: Increase olanzapine  5mg   to 1 1/2 tablets Stop mirtazapine Start sertraline 25 mg tablet 1/2 tablet daily for 1 week then 1 daily for anxiety and depression Continue Ingrezza for TD.  CO change in bite with teeth and he things it's SE Ingrezza.  No pain involved.  Has tongue movements without it.  No benefit noted with med changes. Pt reports that mood is Anxious, Depressed and Hopeless and describes anxiety as Severe. Anxiety symptoms include:  rumination and obsessing as noted.  Stays in be a lot bc of depression. Pt reports sleeps excessively and 10 hours. Pt reports that appetite is decreased. Had lost 15 #.  Weight about 125#  Pt reports that energy is poor and loss of interest or pleasure in usual activities, poor motivation and withdrawn from usual activities. Concentration is down slightly. Suicidal thoughts:  denied by patient. No history of suicide attempts.  Symptoms continue as noted last time as following.  Lack of hope for getting better emotionally.  Anytime I have a moral decision to make it bothers me.  I might make the wrong one.  Irrational guilt.  When I get on something it's repetitive but they switch every so often.  Lately it's when I go to church I feel like the pastor's aware of his situation and it bothers me.  He can't read patient's thoughts.  Things pastor said.  Wife think's it's false guilt.  Sx going on for 18 mos.  Happened one other time 15 years ago.  Trilafon helped at the time.  Sometimes afraid. Down is even worse than the anxiety.  Sleeping lots and hard time getting up for awhile, not just since olanzapine.  No SE, better than the perphenazine but not the best.  Emotionally a little better. Lost weight over time and appetite a little better.  Has heard an inner voice twice at church and has seen sensual kind of manifestation and he heard this thougts to himself, she wants people to look at her and then "  let's make that happen".  Then saw 2 blocks and thought if I get the blocks to line up then I could make it happen.  Felt real joy momentarily.  Saw the woman return to her seat..  Happened 18 mos ago and started the current problem.  Hx constipation with meds resolved.  Past Psychiatric Medication Trials: Mirtazapine, olanzapine 5, perphenazine, buspirone, Ingrezza, amitriptyline 25, risperidone 2 tremors  Review of Systems:  Review of Systems  Gastrointestinal: Negative for constipation.  Neurological:  Positive for tremors and weakness.  Psychiatric/Behavioral: Positive for decreased concentration, dysphoric mood and sleep disturbance. Negative for agitation, behavioral problems, confusion and self-injury. The patient is nervous/anxious. The patient is not hyperactive.     Medications: I have reviewed the patient's current medications.  Current Outpatient Medications  Medication Sig Dispense Refill  . docusate sodium (COLACE) 100 MG capsule Take 1 capsule (100 mg total) by mouth every 12 (twelve) hours. 60 capsule 0  . Multiple Vitamin (MULTIVITAMIN WITH MINERALS) TABS tablet Take 1 tablet by mouth daily.    Marland Kitchen OLANZapine (ZYPREXA) 5 MG tablet TAKE 1/2 TABLET EVERY DAY FOR 7 DAYS THEN TAKE 1 TABLET EVERY DAY (Patient taking differently: Take 7.5 mg by mouth at bedtime. ) 90 tablet 0  . omega-3 acid ethyl esters (LOVAZA) 1 g capsule Take 1 g by mouth daily.    . sertraline (ZOLOFT) 25 MG tablet TAKE 1/2 TABLET DAILY FOR 1 WEEK THEN 1 TABLET DAILY (Patient taking differently: Take 25 mg by mouth at bedtime. ) 90 tablet 0  . Valbenazine Tosylate (INGREZZA) 40 MG CAPS Take 40 mg by mouth daily.     No current facility-administered medications for this visit.     Medication Side Effects: None  Allergies:  Allergies  Allergen Reactions  . Aspirin Swelling and Other (See Comments)    Reaction:  Tongue swelling   . Sulfur Other (See Comments)    Reaction:  Unknown   . Tetanus Toxoids Swelling and Other (See Comments)    Reaction:  Arm swelling    Past Medical History:  Diagnosis Date  . Anxiety   . Depression     History reviewed. No pertinent family history.  Social History   Socioeconomic History  . Marital status: Married    Spouse name: Not on file  . Number of children: Not on file  . Years of education: Not on file  . Highest education level: Not on file  Occupational History  . Not on file  Social Needs  . Financial resource strain: Not on file  . Food insecurity:     Worry: Not on file    Inability: Not on file  . Transportation needs:    Medical: Not on file    Non-medical: Not on file  Tobacco Use  . Smoking status: Never Smoker  . Smokeless tobacco: Never Used  Substance and Sexual Activity  . Alcohol use: No  . Drug use: Not on file  . Sexual activity: Not on file  Lifestyle  . Physical activity:    Days per week: Not on file    Minutes per session: Not on file  . Stress: Not on file  Relationships  . Social connections:    Talks on phone: Not on file    Gets together: Not on file    Attends religious service: Not on file    Active member of club or organization: Not on file    Attends meetings of clubs or organizations: Not on  file    Relationship status: Not on file  . Intimate partner violence:    Fear of current or ex partner: Not on file    Emotionally abused: Not on file    Physically abused: Not on file    Forced sexual activity: Not on file  Other Topics Concern  . Not on file  Social History Narrative  . Not on file    Past Medical History, Surgical history, Social history, and Family history were reviewed and updated as appropriate.   Please see review of systems for further details on the patient's review from today.   Objective:   Physical Exam:  There were no vitals taken for this visit.  Physical Exam Neurological:     Mental Status: He is alert and oriented to person, place, and time.     Cranial Nerves: No dysarthria.  Psychiatric:        Attention and Perception: Attention normal.        Mood and Affect: Mood is anxious and depressed.        Speech: Speech normal.        Behavior: Behavior is cooperative.        Thought Content: Thought content is paranoid. Thought content is not delusional. Thought content does not include homicidal or suicidal ideation. Thought content does not include homicidal or suicidal plan.        Cognition and Memory: Cognition and memory normal.        Judgment: Judgment  normal.     Comments: Insight fair. Still ruminating and obsessive with paranoid delusions as noted.     Lab Review:  No results found for: NA, K, CL, CO2, GLUCOSE, BUN, CREATININE, CALCIUM, PROT, ALBUMIN, AST, ALT, ALKPHOS, BILITOT, GFRNONAA, GFRAA  No results found for: WBC, RBC, HGB, HCT, PLT, MCV, MCH, MCHC, RDW, LYMPHSABS, MONOABS, EOSABS, BASOSABS  No results found for: POCLITH, LITHIUM   No results found for: PHENYTOIN, PHENOBARB, VALPROATE, CBMZ   .res Assessment: Plan:    Severe recurrent major depression with psychotic features, mood-congruent (HCC)  Generalized anxiety disorder  Tardive dyskinesia   Greater than 50% of face to face time with patient was spent on counseling and coordination of care. We discussed Patient has severe recurrent major depression with psychotic features.  There are also elements of OCD.  He is paranoid and severely depressed and anxious.  He is tolerating the olanzapine better than the perphenazine.  He is frustrated with his lack of progress over the last 18 months.  Discussed his depression.  We need to further increase his medication because of inadequate response.  Increase olanzapine  5mg   to 10 mg daily.  Increase sertraline to 50 mg or 2 of 25 mg tablets.  Continue Ingrezza for TD.  If not improvement in 4 weeks call back and we will increase sertraline again.  Disc SE each.  Discussed side effects of each medication.  Discussed potential metabolic side effects associated with atypical antipsychotics, as well as potential risk for movement side effects. Advised pt to contact office if movement side effects occur.   Constipation resolved.  FU 8 weeks.  Lynder Parents, MD, DFAPA     No future appointments.  No orders of the defined types were placed in this encounter.     -------------------------------

## 2019-05-26 ENCOUNTER — Other Ambulatory Visit: Payer: Self-pay | Admitting: Psychiatry

## 2019-05-30 ENCOUNTER — Other Ambulatory Visit: Payer: Self-pay

## 2019-05-30 ENCOUNTER — Telehealth: Payer: Self-pay | Admitting: Psychiatry

## 2019-05-30 MED ORDER — SERTRALINE HCL 50 MG PO TABS: ORAL_TABLET | ORAL | 2 refills | Status: DC

## 2019-05-30 NOTE — Telephone Encounter (Signed)
Pt called to request refill on Sertraline. Ran out early due to dose change. [facility] on file

## 2019-05-30 NOTE — Telephone Encounter (Signed)
Updated rx sent to [facility]

## 2019-05-30 NOTE — Telephone Encounter (Signed)
error 

## 2019-06-02 ENCOUNTER — Telehealth: Payer: Self-pay | Admitting: Psychiatry

## 2019-06-02 NOTE — Telephone Encounter (Signed)
error 

## 2019-06-14 ENCOUNTER — Other Ambulatory Visit: Payer: Self-pay

## 2019-06-14 MED ORDER — INGREZZA 40 MG PO CAPS: 40.0000 mg | ORAL_CAPSULE | Freq: Every day | ORAL | 5 refills | Status: DC

## 2019-06-15 ENCOUNTER — Ambulatory Visit: Payer: Medicare HMO | Admitting: Psychiatry

## 2019-06-15 ENCOUNTER — Encounter: Payer: Self-pay | Admitting: Psychiatry

## 2019-06-15 ENCOUNTER — Other Ambulatory Visit: Payer: Self-pay

## 2019-06-15 DIAGNOSIS — G2401 Drug induced subacute dyskinesia: Secondary | ICD-10-CM

## 2019-06-15 DIAGNOSIS — F333 Major depressive disorder, recurrent, severe with psychotic symptoms: Secondary | ICD-10-CM | POA: Diagnosis not present

## 2019-06-15 DIAGNOSIS — F411 Generalized anxiety disorder: Secondary | ICD-10-CM | POA: Diagnosis not present

## 2019-06-15 DIAGNOSIS — R69 Illness, unspecified: Secondary | ICD-10-CM | POA: Diagnosis not present

## 2019-06-15 MED ORDER — SERTRALINE HCL 25 MG PO TABS: 75.0000 mg | ORAL_TABLET | Freq: Every day | ORAL | 1 refills | Status: DC

## 2019-06-15 NOTE — Progress Notes (Signed)
Neil [name] [id number] 28-Apr-1941 78 y.o.     Subjective:   Patient ID:  Neil [name] is a 78 y.o. (DOB 15-Aug-1941) male.  Chief Complaint:  Chief Complaint  Patient presents with  . Depression  . Anxiety  . Medication Management  . Weight Loss    HPI Neil [name] presents today for follow-up of psychosis, depression, and anxiety  Last visit in May we increased olanzapine to 10 mg daily and increase sertraline to 50 mg daily.  Seem to be doing good.  More self control of thoughts but hasn't helped with the shame that I feel and would like to get back to normal.  Less rumination but not gone.  CO change in bite with teeth and he things it's SE Ingrezza.  No pain involved.  Has tongue movements without it.  He has managed this by adjusting the dose of the Ingrezza.  He takes less than the usual 40 mg daily.  No benefit noted with med changes. Pt reports that mood is Not deeply depressed.  Anxiety is better.  Kind of numb to it.  Shame is primary mood sx. Anxiety symptoms include: rumination and obsessing as noted.  Stays in bed a lot off and on partly DT boredom. Pt reports sleeps excessively and 10 hours. Pt reports that appetite is better.  Has tried to regain weight.  At 130#.  Lowest was 120# and normal is 140#. Pt reports that energy is poor but better and loss of interest or pleasure in usual activities, poor motivation and withdrawn from usual activities. Concentration is down slightly. Suicidal thoughts:  denied by patient. No history of suicide attempts.  Not doing much.  Son [name] living with them.  Become more committed [place].  He has schizophrenia.   Symptoms continue as noted last time as following.  Less hopeless but still some.  Anytime I have a moral decision to make it bothers me.  I might make the wrong one.  Irrational guilt.  When I get on something it's repetitive but they switch every so often.  Lately it's when I go to church I feel like the pastor's  aware of his situation and it bothers me.  He can't read patient's thoughts.  Things pastor said.  Wife think's it's false guilt.  Sx going on for 18 mos.  Happened one other time 15 years ago.  Trilafon helped at the time.  Sometimes afraid. Down is even worse than the anxiety.  Sleeping lots and hard time getting up for awhile, not just since olanzapine.  No SE, better than the perphenazine but not the best.  Emotionally a little better.  Better appetite and weight.  Has heard an inner voice twice at church and has seen sensual kind of manifestation and he heard this thougts to himself, she wants people to look at her and then "let's make that happen".  Then saw 2 blocks and thought if I get the blocks to line up then I could make it happen.  Felt real joy momentarily.  Saw the woman return to her seat..  Happened 18 mos ago and started the current problem.  Still bothered by this experience.  Recognizes he is not his normal self which is usually jovial.  Hx constipation with meds resolved.  Past Psychiatric Medication Trials: Mirtazapine, olanzapine 5, perphenazine, buspirone, Ingrezza, amitriptyline 25, risperidone 2 tremors  Review of Systems:  Review of Systems  Gastrointestinal: Negative for constipation.  Neurological: Positive for tremors and  weakness.  Psychiatric/Behavioral: Positive for decreased concentration, dysphoric mood and sleep disturbance. Negative for agitation, behavioral problems, confusion and self-injury. The patient is nervous/anxious. The patient is not hyperactive.     Medications: I have reviewed the patient's current medications.  Current Outpatient Medications  Medication Sig Dispense Refill  . docusate sodium (COLACE) 100 MG capsule Take 1 capsule (100 mg total) by mouth every 12 (twelve) hours. 60 capsule 0  . Multiple Vitamin (MULTIVITAMIN WITH MINERALS) TABS tablet Take 1 tablet by mouth daily.    Marland Kitchen OLANZapine (ZYPREXA) 10 MG tablet TAKE 1 TABLET BY MOUTH  EVERYDAY AT BEDTIME 90 tablet 0  . omega-3 acid ethyl esters (LOVAZA) 1 g capsule Take 1 g by mouth daily.    . sertraline (ZOLOFT) 50 MG tablet TAKE 1/2 TABLET DAILY FOR 1 WEEK THEN 1 TABLET DAILY 30 tablet 2  . Valbenazine Tosylate (INGREZZA) 40 MG CAPS Take 40 mg by mouth daily. 30 capsule 5   No current facility-administered medications for this visit.     Medication Side Effects: None  Allergies:  Allergies  Allergen Reactions  . Aspirin Swelling and Other (See Comments)    Reaction:  Tongue swelling   . Sulfur Other (See Comments)    Reaction:  Unknown   . Tetanus Toxoids Swelling and Other (See Comments)    Reaction:  Arm swelling    Past Medical History:  Diagnosis Date  . Anxiety   . Depression     History reviewed. No pertinent family history.  Social History   Socioeconomic History  . Marital status: Married    Spouse name: Not on file  . Number of children: Not on file  . Years of education: Not on file  . Highest education level: Not on file  Occupational History  . Not on file  Social Needs  . Financial resource strain: Not on file  . Food insecurity    Worry: Not on file    Inability: Not on file  . Transportation needs    Medical: Not on file    Non-medical: Not on file  Tobacco Use  . Smoking status: Never Smoker  . Smokeless tobacco: Never Used  Substance and Sexual Activity  . Alcohol use: No  . Drug use: Not on file  . Sexual activity: Not on file  Lifestyle  . Physical activity    Days per week: Not on file    Minutes per session: Not on file  . Stress: Not on file  Relationships  . Social Herbalist on phone: Not on file    Gets together: Not on file    Attends religious service: Not on file    Active member of club or organization: Not on file    Attends meetings of clubs or organizations: Not on file    Relationship status: Not on file  . Intimate partner violence    Fear of current or ex partner: Not on file     Emotionally abused: Not on file    Physically abused: Not on file    Forced sexual activity: Not on file  Other Topics Concern  . Not on file  Social History Narrative  . Not on file    Past Medical History, Surgical history, Social history, and Family history were reviewed and updated as appropriate.   Please see review of systems for further details on the patient's review from today.   Objective:   Physical Exam:  There were no vitals  taken for this visit.  Physical Exam Constitutional:      General: He is not in acute distress.    Appearance: He is well-developed.  Musculoskeletal:        General: No deformity.  Neurological:     Mental Status: He is alert and oriented to person, place, and time.     Coordination: Coordination normal.     Gait: Gait abnormal.     Comments: Used arms swing with gait and mildly short gait.  Psychiatric:        Attention and Perception: Attention and perception normal. He does not perceive auditory or visual hallucinations.        Mood and Affect: Mood is anxious and depressed. Affect is blunt. Affect is not labile, angry or inappropriate.        Speech: Speech is not rapid and pressured or slurred.        Behavior: Behavior is slowed.        Thought Content: Thought content is paranoid. Thought content does not include homicidal or suicidal ideation. Thought content does not include homicidal or suicidal plan.        Cognition and Memory: Cognition and memory normal.        Judgment: Judgment normal.     Comments: Insight intact. No delusions.  There is noticeable psychomotor retardation but is not extreme.     Lab Review:  No results found for: NA, K, CL, CO2, GLUCOSE, BUN, CREATININE, CALCIUM, PROT, ALBUMIN, AST, ALT, ALKPHOS, BILITOT, GFRNONAA, GFRAA  No results found for: WBC, RBC, HGB, HCT, PLT, MCV, MCH, MCHC, RDW, LYMPHSABS, MONOABS, EOSABS, BASOSABS  No results found for: POCLITH, LITHIUM   No results found for: PHENYTOIN,  PHENOBARB, VALPROATE, CBMZ   .res Assessment: Plan:    Neil [name] was seen today for depression, anxiety, medication management and weight loss.  Diagnoses and all orders for this visit:  Severe recurrent major depression with psychotic features, mood-congruent ([place])  Generalized anxiety disorder  Tardive dyskinesia     Greater than 50% of face to face time with patient was spent on counseling and coordination of care. We discussed Patient has severe recurrent major depression with psychotic features.  There are also elements of OCD.  He is paranoid and severely depressed and anxious.  His son has schizophrenia so there may be a genetic predisposition to psychosis.  He is tolerating the olanzapine better than the perphenazine.  He is frustrated with his lack of progress over the last 18 months.  Discussed his depression.  We need to further increase his medication because of inadequate response.  Overall he has had a partial response to this depression with psychotic features.  He is less depressed but still very shameful and mildly psychotic.  I am hesitant to increase the olanzapine further because of his age.  He is eating better and sleeping better and less anxious as a result of the olanzapine.  The psychotic symptoms are less severe but he still ruminating on them somewhat.  Continue olanzapine  10 mg daily.  Increase sertraline to 75 daily  Continue Ingrezza for TD.  If not improvement in 4 weeks call back and we will increase sertraline again.  Disc SE each.  Discussed side effects of each medication.  Discussed potential metabolic side effects associated with atypical antipsychotics, as well as potential risk for movement side effects. Advised pt to contact office if movement side effects occur.   Constipation resolved.  Supportive therapy dealing with the trauma  of the prior intrusive sensual psychotic experience noted above.  Feels shame over that.  Has still ruminated over  it.  "This is crazy now"  Seeing the red blocks and thinking he could manipulate hallucinated blocks causing change in external  Circumstances with a woman he saw in church.  Still strong sense of shame over it and has tried to make sense of it ruminating on it.  Felt the other woman turned against him bc he spaced out dealing with her while experiencing this vision.  Wondered if he'd offended her. Disc this as a psychotic episode and he agrees.  This was a 30-minute appointment  FU 8 weeks.  Lynder Parents, MD, DFAPA     No future appointments.  No orders of the defined types were placed in this encounter.     -------------------------------

## 2019-06-15 NOTE — Patient Instructions (Signed)
Increase sertraline to 1 1/2 of 50 mg tablets or 3 of 25 mg tablets= 75 mg total for the day

## 2019-06-19 ENCOUNTER — Other Ambulatory Visit: Payer: Self-pay

## 2019-06-19 DIAGNOSIS — F411 Generalized anxiety disorder: Secondary | ICD-10-CM

## 2019-06-19 DIAGNOSIS — F333 Major depressive disorder, recurrent, severe with psychotic symptoms: Secondary | ICD-10-CM

## 2019-06-19 MED ORDER — SERTRALINE HCL 25 MG PO TABS: 75.0000 mg | ORAL_TABLET | Freq: Every day | ORAL | 1 refills | Status: DC

## 2019-06-22 ENCOUNTER — Other Ambulatory Visit: Payer: Self-pay

## 2019-06-22 DIAGNOSIS — F333 Major depressive disorder, recurrent, severe with psychotic symptoms: Secondary | ICD-10-CM

## 2019-06-22 DIAGNOSIS — F411 Generalized anxiety disorder: Secondary | ICD-10-CM

## 2019-06-22 MED ORDER — SERTRALINE HCL 50 MG PO TABS: 75.0000 mg | ORAL_TABLET | Freq: Every day | ORAL | 2 refills | Status: DC

## 2019-07-06 ENCOUNTER — Other Ambulatory Visit: Payer: Self-pay | Admitting: Psychiatry

## 2019-07-18 ENCOUNTER — Telehealth: Payer: Self-pay | Admitting: Psychiatry

## 2019-07-18 NOTE — Telephone Encounter (Signed)
Schedule him 330 this thursday

## 2019-07-18 NOTE — Telephone Encounter (Signed)
Neil [name] is very concerned about Neil [name] today. She does not know if its a reaction from his meds. Would like a call back asap. Hands shaking, moving very slow when trying to feed hisself.

## 2019-07-18 NOTE — Telephone Encounter (Signed)
RTC to wife.  See sx.  Reduced olanzapine from 10 mg daily to 5 mg DT tongue movement.  She thinks it was about 2 weeks ago.  He thinks it's a couple of weeks also.  She's not sure.    He's also cutting the sertraline in 1/2 daily.  He has not changed the Ingrezza 40 mg daily.    Still bothered by tongue and jaw movement.  No focal neuro sx.  No psychosis.  Walking and eating slow and tremor.  Coherent but slow to respond.  Excessive sleepiness.    Stop olanzapine DT EPS.  No anxiety or paranoia lately.    Appt ASAP and decide about antipsychotic.  Lynder Parents, MD, DFAPA

## 2019-07-20 ENCOUNTER — Other Ambulatory Visit: Payer: Self-pay

## 2019-07-20 ENCOUNTER — Encounter: Payer: Self-pay | Admitting: Psychiatry

## 2019-07-20 ENCOUNTER — Ambulatory Visit (INDEPENDENT_AMBULATORY_CARE_PROVIDER_SITE_OTHER): Payer: Medicare HMO | Admitting: Psychiatry

## 2019-07-20 DIAGNOSIS — R69 Illness, unspecified: Secondary | ICD-10-CM | POA: Diagnosis not present

## 2019-07-20 DIAGNOSIS — F333 Major depressive disorder, recurrent, severe with psychotic symptoms: Secondary | ICD-10-CM | POA: Diagnosis not present

## 2019-07-20 DIAGNOSIS — F411 Generalized anxiety disorder: Secondary | ICD-10-CM

## 2019-07-20 DIAGNOSIS — G2401 Drug induced subacute dyskinesia: Secondary | ICD-10-CM | POA: Diagnosis not present

## 2019-07-20 NOTE — Patient Instructions (Addendum)
Option Saphris  No more olanzapine  Continue sertraline and [name]

## 2019-07-20 NOTE — Progress Notes (Signed)
EDIBERTO SENS [id number] Feb 12, 1941 78 y.o.     Subjective:   Patient ID:  Neil [name] is a 78 y.o. (DOB [birth year]) male.  Chief Complaint:  Chief Complaint  Patient presents with  . Follow-up    Medication Management  . Depression    Medication Management  . Medication Problem    stiff    Depression        Associated symptoms include decreased concentration.   Neil [name] presents today for follow-up of psychosis, depression, and anxiety.  Seen alone and with wife.  In May we increased olanzapine to 10 mg daily and increase sertraline to 50 mg daily.  Last seen June 15, 2019.  Sertraline was increased to 75 mg daily.  Olanzapine was unchanged at 10 mg daily.  Wife called on 8/4 and physician returned call.  See note.  Pt was told to stop olanzapine from current dose of 5 mg daily DT EPS.  Says he gave misinformation about the duration of reduction in the olanzapine to 5 mg was actually only 2 days when he called rather than 2 weeks he reported in the phone call.  Has been off olanzapine since 07/18/2019.  Has not reduced sertraline.    'At times I have the psychotic  Break".  Same intrusive thoughts as noted before.  Noting new.    Seem to be doing good.  More self control of thoughts but hasn't helped with the shame that I feel and would like to get back to normal.  Less rumination but not gone.  CO change in bite with teeth and he things it's SE Ingrezza.  No pain involved.  Has tongue movements without it.  He has managed this by adjusting the dose of the Ingrezza.  He takes less than the usual 40 mg daily. Overall TD sx are better.  No benefit noted with med changes. Pt reports that mood is Not deeply depressed.  Anxiety is present and rated as moderate +.   Shame is primary mood sx. Anxiety symptoms include: rumination and obsessing as noted.  Stays in bed a lot off and on partly DT boredom. Pt reports sleeps excessively and 10 hours. Pt reports that appetite  is better.  Has tried to regain weight.  At 130#.  Lowest was 120# and normal is 140#. Pt reports that energy is poor but better and loss of interest or pleasure in usual activities, poor motivation and withdrawn from usual activities. Concentration is down slightly. Suicidal thoughts:  denied by patient. No history of suicide attempts.  Not doing much.  Son Neil [name] living with them.  Become more committed [place].  He has schizophrenia.   Symptoms continue as noted last time as following.  Less hopeless but still some.  Anytime I have a moral decision to make it bothers me.  I might make the wrong one.  Irrational guilt.  When I get on something it's repetitive but they switch every so often.  Lately it's when I go to church I feel like the pastor's aware of his situation and it bothers me.  He can't read patient's thoughts.  Things pastor said.  Wife think's it's false guilt.  Sx going on for 18 mos.  Happened one other time 15 years ago.  Trilafon helped at the time.  Sometimes afraid. Down is even worse than the anxiety.  Sleeping lots and hard time getting up for awhile, not just since olanzapine.  No SE, better than the perphenazine but  not the best.  Emotionally a little better.  Better appetite and weight.  Has heard an inner voice twice at church and has seen sensual kind of manifestation and he heard this thougts to himself, she wants people to look at her and then "let's make that happen".  Then saw 2 blocks and thought if I get the blocks to line up then I could make it happen.  Felt real joy momentarily.  Saw the woman return to her seat..  Happened 18 mos ago and started the current problem.  Still bothered by this experience.  Recognizes he is not his normal self which is usually jovial.  No voices lately.  Per wife no changes in sx in the last 2 days.    Hx constipation with meds resolved.  Past Psychiatric Medication Trials: Mirtazapine, olanzapine 5, perphenazine started TD with mouth px,  buspirone, Ingrezza, amitriptyline 25, risperidone 2 tremors   Review of Systems:  Review of Systems  Gastrointestinal: Negative for constipation.  Neurological: Positive for tremors and weakness.  Psychiatric/Behavioral: Positive for decreased concentration, depression, dysphoric mood and sleep disturbance. Negative for agitation, behavioral problems, confusion and self-injury. The patient is nervous/anxious. The patient is not hyperactive.     Medications: I have reviewed the patient's current medications.  Current Outpatient Medications  Medication Sig Dispense Refill  . docusate sodium (COLACE) 100 MG capsule Take 1 capsule (100 mg total) by mouth every 12 (twelve) hours. 60 capsule 0  . Multiple Vitamin (MULTIVITAMIN WITH MINERALS) TABS tablet Take 1 tablet by mouth daily.    . sertraline (ZOLOFT) 50 MG tablet Take 1.5 tablets (75 mg total) by mouth daily. 45 tablet 2  . Valbenazine Tosylate (INGREZZA) 40 MG CAPS Take 40 mg by mouth daily. 30 capsule 5   No current facility-administered medications for this visit.     Medication Side Effects: None  Allergies:  Allergies  Allergen Reactions  . Aspirin Swelling and Other (See Comments)    Reaction:  Tongue swelling   . Sulfur Other (See Comments)    Reaction:  Unknown   . Tetanus Toxoids Swelling and Other (See Comments)    Reaction:  Arm swelling    Past Medical History:  Diagnosis Date  . Anxiety   . Depression     History reviewed. No pertinent family history.  Social History   Socioeconomic History  . Marital status: Married    Spouse name: Not on file  . Number of children: Not on file  . Years of education: Not on file  . Highest education level: Not on file  Occupational History  . Not on file  Social Needs  . Financial resource strain: Not on file  . Food insecurity    Worry: Not on file    Inability: Not on file  . Transportation needs    Medical: Not on file    Non-medical: Not on file   Tobacco Use  . Smoking status: Never Smoker  . Smokeless tobacco: Never Used  Substance and Sexual Activity  . Alcohol use: No  . Drug use: Not on file  . Sexual activity: Not on file  Lifestyle  . Physical activity    Days per week: Not on file    Minutes per session: Not on file  . Stress: Not on file  Relationships  . Social Herbalist on phone: Not on file    Gets together: Not on file    Attends religious service: Not on file  Active member of club or organization: Not on file    Attends meetings of clubs or organizations: Not on file    Relationship status: Not on file  . Intimate partner violence    Fear of current or ex partner: Not on file    Emotionally abused: Not on file    Physically abused: Not on file    Forced sexual activity: Not on file  Other Topics Concern  . Not on file  Social History Narrative  . Not on file    Past Medical History, Surgical history, Social history, and Family history were reviewed and updated as appropriate.   Please see review of systems for further details on the patient's review from today.   Objective:   Physical Exam:  There were no vitals taken for this visit.  Physical Exam Constitutional:      General: He is not in acute distress.    Appearance: He is well-developed.  Musculoskeletal:        General: No deformity.  Neurological:     Mental Status: He is alert and oriented to person, place, and time.     Motor: Tremor present.     Coordination: Coordination normal.     Gait: Gait abnormal.     Comments: Reduced arms swing with gait and mildly short gait. 2+ cogwheel rigidity.  Psychiatric:        Attention and Perception: Attention and perception normal. He does not perceive auditory or visual hallucinations.        Mood and Affect: Mood is anxious and depressed. Affect is blunt. Affect is not labile, angry or inappropriate.        Speech: Speech is not rapid and pressured or slurred.         Behavior: Behavior is slowed.        Thought Content: Thought content is paranoid. Thought content does not include homicidal or suicidal ideation. Thought content does not include homicidal or suicidal plan.        Cognition and Memory: Cognition and memory normal.        Judgment: Judgment normal.     Comments: Insight intact. No delusions.  There is noticeable psychomotor retardation but is not extreme.     Lab Review:  No results found for: NA, K, CL, CO2, GLUCOSE, BUN, CREATININE, CALCIUM, PROT, ALBUMIN, AST, ALT, ALKPHOS, BILITOT, GFRNONAA, GFRAA  No results found for: WBC, RBC, HGB, HCT, PLT, MCV, MCH, MCHC, RDW, LYMPHSABS, MONOABS, EOSABS, BASOSABS  No results found for: POCLITH, LITHIUM   No results found for: PHENYTOIN, PHENOBARB, VALPROATE, CBMZ   .res Assessment: Plan:    There are no diagnoses linked to this encounter.   Greater than 50% of face to face time with patient was spent on counseling and coordination of care. We discussed Patient has severe recurrent major depression with psychotic features.  There are also elements of OCD.  He is paranoid and severely depressed and anxious.  His son has schizophrenia so there may be a genetic predisposition to psychosis.  He is tolerating the olanzapine better than the perphenazine.  He is frustrated with his lack of progress over the last 18 months.  Discussed his depression.  We need to further increase his medication because of inadequate response.  Overall he has had a partial response to this depression with psychotic features.  He is less depressed but still very shameful and mildly psychotic.  I am hesitant to increase the olanzapine further because of his age.  He is eating better and sleeping better and less anxious as a result of the olanzapine.  The psychotic symptoms are less severe but he still ruminating on them somewhat.  DC olanzapine DT EPS. Disc risk of more paranoia and psychotic sx.  Likely to eventually get  worse but may be able to get by with a few weeks to determine how much of Parkinsonism is related to olanzapine vs Ingrezza.  Continue sertraline to 75 daily  Continue Ingrezza 40 mg for TD.  If not improvement in 4 weeks call back and we will increase sertraline again.  Disc SE each.  Discussed side effects of each medication.  Discussed potential metabolic side effects associated with atypical antipsychotics, as well as potential risk for movement side effects. Advised pt to contact office if movement side effects occur.   Constipation resolved.  Supportive therapy dealing with the trauma of the prior intrusive sensual psychotic experience noted above.  Feels shame over that.  Has still ruminated over it.  "This is crazy now"  Seeing the red blocks and thinking he could manipulate hallucinated blocks causing change in external  Circumstances with a woman he saw in church.  Still strong sense of shame over it and has tried to make sense of it ruminating on it.  Felt the other woman turned against him bc he spaced out dealing with her while experiencing this vision.  Wondered if he'd offended her. Disc this as a psychotic episode and he agrees.  Need alternative to olanzapine for  Paranoia and history of psychosis with low EPS risk like Saphris or Seroquel.    This was a 30-minute appointment  FU 4 weeks.  Lynder Parents, MD, DFAPA     Future Appointments  Date Time Provider [place]  08/17/2019  1:00 PM Cottle, Billey Co., MD [facility] None    No orders of the defined types were placed in this encounter.     -------------------------------

## 2019-07-21 ENCOUNTER — Telehealth: Payer: Self-pay | Admitting: Psychiatry

## 2019-07-21 NOTE — Telephone Encounter (Signed)
He can take Benadryl or any over-the-counter sleep medicine at night as needed.  Please let him know

## 2019-07-21 NOTE — Telephone Encounter (Signed)
Neil [name], Neil [name] wife called and wanted to know since Neil [name] is weaning off of a medicine due to side effects they want to know can he take benadryl to relax and help him sleep. Please call him at [phone].

## 2019-07-28 ENCOUNTER — Telehealth: Payer: Self-pay | Admitting: Psychiatry

## 2019-07-28 NOTE — Telephone Encounter (Signed)
Pt advised to pick up samples at office of Saphris 2.5 mg

## 2019-07-28 NOTE — Telephone Encounter (Signed)
Pt would like to start on Saphris that was discussed in his last visit. Please send to [facility] on [address].

## 2019-07-28 NOTE — Telephone Encounter (Signed)
Left voice mail to call back 

## 2019-07-28 NOTE — Telephone Encounter (Signed)
Pt called back to talk with Nurse. Please return his call.

## 2019-07-28 NOTE — Telephone Encounter (Signed)
We stopped olanzapine on August 6 due to parkinsonian symptoms.  He is on no current antipsychotic.  Please elicit from him the symptoms that he is having that make him want to restart an antipsychotic.  If he have any intrusive thoughts or paranoid thoughts or other fearful thoughts?  There will be a prior authorization that is required and so we will not be able to pick up the prescription today.  I have samples of Saphris 2.5 mg.  I can give him several weeks worth.  Suggest he come to the office and pick the samples up and try the medicine before we try filling out the paperwork for prior authorization.

## 2019-07-28 NOTE — Telephone Encounter (Signed)
Pt picked up samples of Saphris 2.5 mg and stated he's going to try not to take. But wants to have if need. Gave 3 boxes 10 each box

## 2019-07-28 NOTE — Telephone Encounter (Signed)
Thank you Neil [name] for talking to him

## 2019-08-08 ENCOUNTER — Other Ambulatory Visit: Payer: Self-pay | Admitting: Psychiatry

## 2019-08-08 ENCOUNTER — Telehealth: Payer: Self-pay | Admitting: Psychiatry

## 2019-08-08 MED ORDER — INGREZZA 80 MG PO CAPS: 80.0000 mg | ORAL_CAPSULE | Freq: Every day | ORAL | 0 refills | Status: DC

## 2019-08-08 NOTE — Progress Notes (Signed)
Patient called and asked to increase Ingrezza to 80 mg as he has found that more helpful for his tardive dyskinesia symptoms.

## 2019-08-08 NOTE — Telephone Encounter (Signed)
Neil [name] called to request a refill of his ingrezza.  HOWEVER, the 40mg  doesn't work so he increased to 80mg  and that is working well.  Please call in 80mg  ingrezza to [place].  Next appt 08/17/19

## 2019-08-08 NOTE — Telephone Encounter (Signed)
Prescription sent for 80 mg to [facility]

## 2019-08-17 ENCOUNTER — Encounter: Payer: Self-pay | Admitting: Psychiatry

## 2019-08-17 ENCOUNTER — Other Ambulatory Visit: Payer: Self-pay

## 2019-08-17 ENCOUNTER — Ambulatory Visit (INDEPENDENT_AMBULATORY_CARE_PROVIDER_SITE_OTHER): Payer: Medicare HMO | Admitting: Psychiatry

## 2019-08-17 DIAGNOSIS — F333 Major depressive disorder, recurrent, severe with psychotic symptoms: Secondary | ICD-10-CM

## 2019-08-17 DIAGNOSIS — G2401 Drug induced subacute dyskinesia: Secondary | ICD-10-CM

## 2019-08-17 DIAGNOSIS — F411 Generalized anxiety disorder: Secondary | ICD-10-CM | POA: Diagnosis not present

## 2019-08-17 DIAGNOSIS — R69 Illness, unspecified: Secondary | ICD-10-CM | POA: Diagnosis not present

## 2019-08-17 DIAGNOSIS — F200 Paranoid schizophrenia: Secondary | ICD-10-CM | POA: Diagnosis not present

## 2019-08-17 NOTE — Progress Notes (Signed)
Neil [name] [id number] 16-Sep-1941 78 y.o.     Subjective:   Patient ID:  Neil [name] is a 78 y.o. (DOB [birth year]) male.  Chief Complaint:  Chief Complaint  Patient presents with  . Follow-up    Medication Management and psychosis  . Depression    Medication Management  . Medication Problem    TD, recent olanzapine SE    Depression        Associated symptoms include decreased concentration.   Neil [name] presents today for follow-up of psychosis, depression, and anxiety.  He has required an urgent appointment because of racing worsening psychotic symptoms and frequent phone calls with med changes since he was last here  In May we increased olanzapine to 10 mg daily and increase sertraline to 50 mg daily.  When seen June 15, 2019.  Sertraline was increased to 75 mg daily.  Olanzapine was unchanged at 10 mg daily.  Wife called on 8/4 and physician returned call.  See note.  Pt was told to stop olanzapine from current dose of 5 mg daily DT EPS.  Says he gave misinformation about the duration of reduction in the olanzapine to 5 mg was actually only 2 days when he called rather than 2 weeks he reported in the phone call.   Has been off olanzapine since 07/18/2019.    Last seen August 6.  Olanzapine had just been stopped no new meds were started waiting for side effects to resolve.  The call back August 7 with complaints of insomnia off olanzapine and then again August 14 at which point they wanted to go ahead and start the Saphris.  He was started on Saphris 2.5 mg daily.  Phone call on August 25 asking to increase Ingrezza from 40 mg back to 80 mg complaining that 40 mg was not adequately controlled to tardive dyskinesia symptoms.  Don't havea feeling of well being and lately started doubting his personal salvation.  Nagging thoughts much worse Sunday night after listening to pastor on TV.  No other disturbing thoughts.  More worry.  Since starting Saphris 2.5 mg  haven't notice much.  No SE noted.  To bed 8 p and awaken 5 am, less than on olanzapine.  Parkinsonian SE olanzapine resolved.  Some tongue movement and mouth open before increase Ingrezza is better.  'At times I have the psychotic  Break".  Same intrusive thoughts as noted before.  Noting new.    Seem to be doing good.  More self control of thoughts but hasn't helped with the shame that I feel and would like to get back to normal.  Less rumination but not gone.  Pt reports that mood is Not deeply depressed but no sense of well-being..  Anxiety is present and rated as moderate +.   Shame is primary mood sx. Anxiety symptoms include: rumination and obsessing as noted.  Stays in bed a lot still bc doesn't want to face the day  Not sad.  Afraid. Pt reports that appetite is better.  Has tried to regain weight.  At 129#.  Lowest was 120# and normal is 140#. Pt reports that energy is poor but better and loss of interest or pleasure in usual activities, poor motivation and withdrawn from usual activities. Concentration is down slightly. Suicidal thoughts:  denied by patient.  Walks at 2:30 and feels better after that.  No history of suicide attempts.  Not doing much.  Son Neil [name] living with them.  Become more committed [place].  He has schizophrenia.   Past Psychiatric Medication Trials: Mirtazapine, olanzapine 5, perphenazine started TD with mouth px, buspirone, Ingrezza, amitriptyline 25, risperidone 2 tremors   Review of Systems:  Review of Systems  Gastrointestinal: Negative for constipation.  Neurological: Positive for tremors and weakness.  Psychiatric/Behavioral: Positive for decreased concentration, depression, dysphoric mood and sleep disturbance. Negative for agitation, behavioral problems, confusion and self-injury. The patient is nervous/anxious. The patient is not hyperactive.     Medications: I have reviewed the patient's current medications.  Current Outpatient Medications   Medication Sig Dispense Refill  . Asenapine Maleate (SAPHRIS) 2.5 MG SUBL Place 2.5 mg under the tongue daily.    Marland Kitchen docusate sodium (COLACE) 100 MG capsule Take 1 capsule (100 mg total) by mouth every 12 (twelve) hours. 60 capsule 0  . Multiple Vitamin (MULTIVITAMIN WITH MINERALS) TABS tablet Take 1 tablet by mouth daily.    . sertraline (ZOLOFT) 50 MG tablet Take 1.5 tablets (75 mg total) by mouth daily. 45 tablet 2  . Valbenazine Tosylate (INGREZZA) 80 MG CAPS Take 80 mg by mouth daily. 90 capsule 0   No current facility-administered medications for this visit.     Medication Side Effects: None  Allergies:  Allergies  Allergen Reactions  . Aspirin Swelling and Other (See Comments)    Reaction:  Tongue swelling   . Sulfur Other (See Comments)    Reaction:  Unknown   . Tetanus Toxoids Swelling and Other (See Comments)    Reaction:  Arm swelling    Past Medical History:  Diagnosis Date  . Anxiety   . Depression     History reviewed. No pertinent family history.  Social History   Socioeconomic History  . Marital status: Married    Spouse name: Not on file  . Number of children: Not on file  . Years of education: Not on file  . Highest education level: Not on file  Occupational History  . Not on file  Social Needs  . Financial resource strain: Not on file  . Food insecurity    Worry: Not on file    Inability: Not on file  . Transportation needs    Medical: Not on file    Non-medical: Not on file  Tobacco Use  . Smoking status: Never Smoker  . Smokeless tobacco: Never Used  Substance and Sexual Activity  . Alcohol use: No  . Drug use: Not on file  . Sexual activity: Not on file  Lifestyle  . Physical activity    Days per week: Not on file    Minutes per session: Not on file  . Stress: Not on file  Relationships  . Social Herbalist on phone: Not on file    Gets together: Not on file    Attends religious service: Not on file    Active member  of club or organization: Not on file    Attends meetings of clubs or organizations: Not on file    Relationship status: Not on file  . Intimate partner violence    Fear of current or ex partner: Not on file    Emotionally abused: Not on file    Physically abused: Not on file    Forced sexual activity: Not on file  Other Topics Concern  . Not on file  Social History Narrative  . Not on file    Past Medical History, Surgical history, Social history, and Family history were reviewed and updated as appropriate.   Please  see review of systems for further details on the patient's review from today.   Objective:   Physical Exam:  There were no vitals taken for this visit.  Physical Exam Constitutional:      General: He is not in acute distress.    Appearance: He is well-developed.  Musculoskeletal:        General: No deformity.  Neurological:     Mental Status: He is alert and oriented to person, place, and time.     Motor: Tremor present.     Coordination: Coordination normal.     Gait: Gait abnormal.     Comments: Reduced arms swing with gait and mildly short gait. 2+ cogwheel rigidity.  Psychiatric:        Attention and Perception: Attention and perception normal. He does not perceive auditory or visual hallucinations.        Mood and Affect: Mood is anxious and depressed. Affect is blunt. Affect is not labile, angry or inappropriate.        Speech: Speech is not rapid and pressured or slurred.        Behavior: Behavior is slowed.        Thought Content: Thought content is paranoid. Thought content does not include homicidal or suicidal ideation. Thought content does not include homicidal or suicidal plan.        Cognition and Memory: Cognition and memory normal.        Judgment: Judgment normal.     Comments: Insight intact. No delusions.  There is noticeable psychomotor retardation but is not extreme. Not markedly parkinsonian. Paranoia worse about salvation.     Lab  Review:  No results found for: NA, K, CL, CO2, GLUCOSE, BUN, CREATININE, CALCIUM, PROT, ALBUMIN, AST, ALT, ALKPHOS, BILITOT, GFRNONAA, GFRAA  No results found for: WBC, RBC, HGB, HCT, PLT, MCV, MCH, MCHC, RDW, LYMPHSABS, MONOABS, EOSABS, BASOSABS  No results found for: POCLITH, LITHIUM   No results found for: PHENYTOIN, PHENOBARB, VALPROATE, CBMZ   .res Assessment: Plan:    Jovane was seen today for follow-up, depression and medication problem.  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 We discussed Patient has history of severe recurrent major depression with psychotic features.  There are also elements of OCD.  He is paranoid and anxious but now feel he denies feeling significantly sad or depressed though he describes a sense of being unwell.Marland Kitchen  His son has schizophrenia so there may be a genetic predisposition to psychosis.  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  He ended up having too much parkinsonian symptoms so olanzapine and that was discontinued.  The olanzapine symptoms or side effects resolved but now he is more paranoid.  He has been on Saphris 2.5 mg nightly for 2 weeks and is tolerating it well.  Again his depression is better with the sertraline but I do not believe it is resolved.  Therefore increase Saphris to 5 mg nightly for paranoia and psychosis.  Samples given  Continue sertraline to 75 daily  Disc SE in detail and SSRI withdrawal sx.  Continue Ingrezza 80 mg for TD. It was recently increased and is working better.  Discussed  side effects of each medication.  Discussed potential  metabolic side effects associated with atypical antipsychotics, as well as potential risk for movement side effects. Advised pt to contact office if movement side effects occur.   Supportive therapy dealing with the trauma of the prior intrusive sensual psychotic experience noted above.  Feels shame over that.  Has still ruminated over it.  He now denies any auditory hallucinations but had worsening paranoia about losing his salvation especially in the last few days.  Rec walk earlier in day to try to help mood.  Greater than 50% of 30-minute face to face time with patient was spent on counseling and coordination of care.   FU 2 weeks.  Neil Parents, MD, DFAPA     Future Appointments  Date Time Provider [place]  09/01/2019  1:00 PM Cottle, Billey Co., MD [facility] None  09/21/2019  1:00 PM Cottle, Billey Co., MD [facility] None  10/05/2019  1:30 PM Cottle, Billey Co., MD [facility] None    No orders of the defined types were placed in this encounter.     -------------------------------

## 2019-08-17 NOTE — Patient Instructions (Signed)
Increase Saphris to 5 mg under the tongue each evening and wait at least 5 to 10 minutes before eating or drinking

## 2019-08-22 ENCOUNTER — Telehealth: Payer: Self-pay | Admitting: Psychiatry

## 2019-08-22 DIAGNOSIS — R69 Illness, unspecified: Secondary | ICD-10-CM | POA: Diagnosis not present

## 2019-08-22 NOTE — Telephone Encounter (Signed)
No do not stop Saphris.  Side effects will get better by the end of the week.

## 2019-08-22 NOTE — Telephone Encounter (Signed)
Spoke with pt. And he stated that you switched his medicine to 2.5 Mg Saphris. He took for 12 days. Then moved up to 5 mg. He is having side effects such as mouth stays open, tongue movements, feels funny. He moved himself back down to 2.5 Mg and the side effects improved slightly but not by much. He is wanting to know if he can come off of the Saphris? Please advise.

## 2019-08-22 NOTE — Telephone Encounter (Signed)
Pt left v-mail asking Dr. Clovis Pu to call him

## 2019-08-23 NOTE — Telephone Encounter (Signed)
He can stop it but the intrusive disturbing thoughts and paranoid thoughts will get worse.  Once the side effects go away we can try to find another medication that he can better tolerate.

## 2019-08-23 NOTE — Telephone Encounter (Signed)
Pt. States that he desires to stop the Saphris because everyday the side effects are getting worse. He says since the first week  he has been having trouble with it and does not want to keep going through this. Please advise.

## 2019-08-25 NOTE — Telephone Encounter (Signed)
Tried calling pt. Several times yesterday but was unable to leave a msg. I got in touch with him today. He says he will think about staying on the Saphris but he is not sure. He states he will call if things get worse and he will see you at his appt. Next Friday the 18th.

## 2019-09-01 ENCOUNTER — Ambulatory Visit (INDEPENDENT_AMBULATORY_CARE_PROVIDER_SITE_OTHER): Payer: Medicare HMO | Admitting: Psychiatry

## 2019-09-01 ENCOUNTER — Encounter: Payer: Self-pay | Admitting: Psychiatry

## 2019-09-01 ENCOUNTER — Other Ambulatory Visit: Payer: Self-pay

## 2019-09-01 DIAGNOSIS — F411 Generalized anxiety disorder: Secondary | ICD-10-CM

## 2019-09-01 DIAGNOSIS — G2401 Drug induced subacute dyskinesia: Secondary | ICD-10-CM

## 2019-09-01 DIAGNOSIS — F061 Catatonic disorder due to known physiological condition: Secondary | ICD-10-CM

## 2019-09-01 DIAGNOSIS — F251 Schizoaffective disorder, depressive type: Secondary | ICD-10-CM

## 2019-09-01 DIAGNOSIS — R69 Illness, unspecified: Secondary | ICD-10-CM | POA: Diagnosis not present

## 2019-09-01 NOTE — Progress Notes (Signed)
[name] 11-04-1941 78 y.o.     Subjective:   Patient ID:  Neil [name] is a 78 y.o. (DOB 1941-06-28) male.  Chief Complaint:  Chief Complaint  Patient presents with  . Depression     Medication Management  . Anxiety     Medication Management  . Paranoid     Medication Management-Increase since stopped Saphris   . Medication Problem    Recently stop Saphris    Depression        Associated symptoms include decreased concentration.   Neil [name] presents today for follow-up of psychosis, depression, and anxiety.  He has required an urgent appointment because of racing worsening psychotic symptoms and frequent phone calls with med changes since he was last here  Last seen September 3 he was switched off olanzapine because of EPS and switched on to Saphris which was raised to 5 mg nightly but only took that dose 1 time.  The rest of the time he took only 2.5 mg nightly.   He called back 5 days later stating he wanted to stop it because it was causing him to hold his mouth open and he was having a worsening time with tongue movements and felt "funny".  He was encouraged to give it more time but he would not agree to do so.  He stopped it Friday 9/11 and still feels the funny tense feeling.    A little more alert, but tension is worse the last couple of days.  Thoughts are "fair".  Occ fearful worry thoughts including last night that pastor might preach again against him.  "Apprehensive".  Felt really tense and anxious last night.  Didn't sleep well.  Doesn't feel depressed. Diminished function bc low motivation.  Dentist said his bite has changed.   "I think it's time to get back on an antipsychotic." Felt out of control last night.  Stays in bed a lot.  Less doubting salvation lately. No other disturbing thoughts.  More worry.  Some tongue movement and mouth open again. Doesn't think Neil [name] is working.   Wife wonders about trying Caplyta bc saw it on TV.     'At times I have the psychotic  Break".  Same intrusive thoughts as noted before.  Noting new.    Seem to be doing good.  More self control of thoughts but hasn't helped with the shame that I feel and would like to get back to normal.  Less rumination but not gone.  Pt reports that mood is Not deeply depressed but no sense of well-being..  Anxiety is present and rated as moderate +.   Shame is primary mood sx. Anxiety symptoms include: rumination and obsessing as noted.  Stays in bed a lot still bc doesn't want to face the day  Not sad.  Afraid. Pt reports that appetite is better.  Has tried to regain weight.  At 129#.  Lowest was 120# and normal is 140#. Pt reports that energy is poor but better and loss of interest or pleasure in usual activities, poor motivation and withdrawn from usual activities. Concentration is down slightly. Suicidal thoughts:  denied by patient.  Walks at 2:30 and feels better after that.  No history of suicide attempts.  Not doing much.  Son Neil [name] living with them.  Become more committed [place].  He has schizophrenia.   Past Psychiatric Medication Trials: Mirtazapine, olanzapine 5 EPS, perphenazine started TD with mouth px, Saphris 5 mg and stopped due to  EPS complaints and feeling "funny"  buspirone, Ingrezza, amitriptyline 25, risperidone 2 tremors   Review of Systems:  Review of Systems  Gastrointestinal: Negative for constipation.  Neurological: Positive for tremors and weakness. Negative for dizziness and syncope.       No falls   Psychiatric/Behavioral: Positive for decreased concentration, depression, dysphoric mood and sleep disturbance. Negative for agitation, behavioral problems, confusion, hallucinations and self-injury. The patient is nervous/anxious. The patient is not hyperactive.     Medications: I have reviewed the patient's current medications.  Current Outpatient Medications  Medication Sig Dispense Refill  . docusate sodium (COLACE) 100 MG  capsule Take 1 capsule (100 mg total) by mouth every 12 (twelve) hours. 60 capsule 0  . Multiple Vitamin (MULTIVITAMIN WITH MINERALS) TABS tablet Take 1 tablet by mouth daily.    . sertraline (ZOLOFT) 50 MG tablet Take 1.5 tablets (75 mg total) by mouth daily. 45 tablet 2  . Valbenazine Tosylate (INGREZZA) 80 MG CAPS Take 80 mg by mouth daily. 90 capsule 0  . Asenapine Maleate (SAPHRIS) 2.5 MG SUBL Place 2.5 mg under the tongue daily.     No current facility-administered medications for this visit.     Medication Side Effects: None  Allergies:  Allergies  Allergen Reactions  . Aspirin Swelling and Other (See Comments)    Reaction:  Tongue swelling   . Sulfur Other (See Comments)    Reaction:  Unknown   . Tetanus Toxoids Swelling and Other (See Comments)    Reaction:  Arm swelling    Past Medical History:  Diagnosis Date  . Anxiety   . Depression     History reviewed. No pertinent family history.  Social History   Socioeconomic History  . Marital status: Married    Spouse name: Not on file  . Number of children: Not on file  . Years of education: Not on file  . Highest education level: Not on file  Occupational History  . Not on file  Social Needs  . Financial resource strain: Not on file  . Food insecurity    Worry: Not on file    Inability: Not on file  . Transportation needs    Medical: Not on file    Non-medical: Not on file  Tobacco Use  . Smoking status: Never Smoker  . Smokeless tobacco: Never Used  Substance and Sexual Activity  . Alcohol use: No  . Drug use: Not on file  . Sexual activity: Not on file  Lifestyle  . Physical activity    Days per week: Not on file    Minutes per session: Not on file  . Stress: Not on file  Relationships  . Social Herbalist on phone: Not on file    Gets together: Not on file    Attends religious service: Not on file    Active member of club or organization: Not on file    Attends meetings of clubs or  organizations: Not on file    Relationship status: Not on file  . Intimate partner violence    Fear of current or ex partner: Not on file    Emotionally abused: Not on file    Physically abused: Not on file    Forced sexual activity: Not on file  Other Topics Concern  . Not on file  Social History Narrative  . Not on file    Past Medical History, Surgical history, Social history, and Family history were reviewed and updated as appropriate.  Please see review of systems for further details on the patient's review from today.   Objective:   Physical Exam:  There were no vitals taken for this visit.  Physical Exam Constitutional:      General: He is not in acute distress.    Appearance: He is well-developed.  Musculoskeletal:        General: No deformity.  Neurological:     Mental Status: He is alert and oriented to person, place, and time.     Motor: Tremor present.     Coordination: Coordination normal.     Gait: Gait abnormal.  Psychiatric:        Attention and Perception: Attention and perception normal. He does not perceive auditory or visual hallucinations.        Mood and Affect: Mood is anxious and depressed. Affect is blunt. Affect is not labile, angry or inappropriate.        Speech: Speech is delayed. Speech is not rapid and pressured or slurred.        Behavior: Behavior is slowed.        Thought Content: Thought content is paranoid. Thought content does not include homicidal or suicidal ideation. Thought content does not include homicidal or suicidal plan.        Cognition and Memory: Cognition and memory normal.        Judgment: Judgment normal.     Comments: Insight intact. No delusions.  There is noticeable psychomotor retardation but is not extreme. Not markedly parkinsonian. Paranoia worse about salvation.     Lab Review:  No results found for: NA, K, CL, CO2, GLUCOSE, BUN, CREATININE, CALCIUM, PROT, ALBUMIN, AST, ALT, ALKPHOS, BILITOT, GFRNONAA,  GFRAA  No results found for: WBC, RBC, HGB, HCT, PLT, MCV, MCH, MCHC, RDW, LYMPHSABS, MONOABS, EOSABS, BASOSABS  No results found for: POCLITH, LITHIUM   No results found for: PHENYTOIN, PHENOBARB, VALPROATE, CBMZ   .res Assessment: Plan:    Yishay was seen today for depression, anxiety, paranoid and medication problem.  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 We discussed Patient has history of severe recurrent major depression with psychotic features.  There are also elements of OCD.  He is paranoid and anxious but now feel he denies feeling significantly sad or depressed though he describes a sense of being unwell.Marland Kitchen  His son has schizophrenia so there may be a genetic predisposition to psychosis.  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  He ended up having too much parkinsonian symptoms so olanzapine and that was discontinued.    Again his depression is better with the sertraline but I do not believe it is resolved.  Would prefer to find an antipsychotic with low EPS risks but that is proving difficult.  He did not tolerate olanzapine nor Saphris.  Other meds with low EPS risk include Seroquel but there is risk of significant sedation or Fanapt but there is risk of orthostatic hypotension. Clozapine option with obvious concerns.  Asked about Caplyta.  Consider Latuda bc also helps depression.  Continue sertraline to 75 daily.  He says he's less depressed.  Disc SE in detail and SSRI withdrawal sx.  Change Ingreezza to [employer] of continued sx.  He changed his mind and wants to try a new antipsychotic  first bc last night just about restarted the Saphris. Discussed the dosing titration schedule with [name].  Because he could have worsening of TD symptoms before they eventually improve.   Restart Saphris 2.5 mg HS until we can get Caplyta samples.  Both have low EPS risks. We discussed other alternatives as well.  He is needs a med with low EPS risk if possible.  Discussed side effects of each medication.  Discussed potential metabolic side effects associated with atypical antipsychotics, as well as potential risk for movement side effects. Advised pt to contact office if movement side effects occur.   Supportive therapy dealing with the trauma of the prior intrusive sensual psychotic experience noted above.  Feels shame over that.  Has still ruminated over it.  He now denies any auditory hallucinations but had worsening paranoia about losing his salvation especially in the last few days.  Rec walk earlier in day to try to help mood.  Greater than 50% of 30-minute face to face time with patient was spent on counseling and coordination of care.   FU 2 weeks.  Lynder Parents, MD, DFAPA     Future Appointments  Date Time Provider [place]  09/21/2019  1:00 PM Cottle, Billey Co., MD [facility] None  10/05/2019  1:30 PM Cottle, Billey Co., MD [facility] None    No orders of the defined types were placed in this encounter.     -------------------------------

## 2019-09-01 NOTE — Patient Instructions (Signed)
For tardive dyskinesia symptoms including the tongue movements, Stop Ingrezza Start [name] 6 mg twice daily for 1 week, Then increase [name] to 9 mg twice daily  Restart the Saphris 2.5 mg nightly now  At her next appointment we should be able to get a hold of some Caplyta samples and you can try the medicine in place of Saphris.  I do not have access to those samples today

## 2019-09-12 ENCOUNTER — Other Ambulatory Visit: Payer: Self-pay | Admitting: Psychiatry

## 2019-09-12 DIAGNOSIS — F411 Generalized anxiety disorder: Secondary | ICD-10-CM

## 2019-09-12 DIAGNOSIS — F333 Major depressive disorder, recurrent, severe with psychotic symptoms: Secondary | ICD-10-CM

## 2019-09-15 ENCOUNTER — Encounter: Payer: Self-pay | Admitting: Psychiatry

## 2019-09-15 ENCOUNTER — Ambulatory Visit (INDEPENDENT_AMBULATORY_CARE_PROVIDER_SITE_OTHER): Payer: Medicare HMO | Admitting: Psychiatry

## 2019-09-15 ENCOUNTER — Other Ambulatory Visit: Payer: Self-pay

## 2019-09-15 DIAGNOSIS — G2401 Drug induced subacute dyskinesia: Secondary | ICD-10-CM | POA: Diagnosis not present

## 2019-09-15 DIAGNOSIS — F061 Catatonic disorder due to known physiological condition: Secondary | ICD-10-CM | POA: Diagnosis not present

## 2019-09-15 DIAGNOSIS — R69 Illness, unspecified: Secondary | ICD-10-CM | POA: Diagnosis not present

## 2019-09-15 DIAGNOSIS — F251 Schizoaffective disorder, depressive type: Secondary | ICD-10-CM

## 2019-09-15 DIAGNOSIS — F411 Generalized anxiety disorder: Secondary | ICD-10-CM | POA: Diagnosis not present

## 2019-09-15 MED ORDER — AUSTEDO 12 MG PO TABS: 12.0000 mg | ORAL_TABLET | Freq: Two times a day (BID) | ORAL | 1 refills | Status: DC

## 2019-09-15 NOTE — Patient Instructions (Signed)
Stop Saphris  Start Caplyta capsules 1 daily.

## 2019-09-15 NOTE — Progress Notes (Signed)
Neil [name] [id number] 19-Aug-1941 78 y.o.     Subjective:   Patient ID:  Neil [name] is a 78 y.o. (DOB [birth year]) male.  Chief Complaint:  Chief Complaint  Patient presents with  . Follow-up    Medication Management  . Depression    Medication Management  . Tardive dyskines    Depression        Associated symptoms include decreased concentration and appetite change.   Neil [name] presents today for follow-up of psychosis, depression, and anxiety.  He has required an urgent appointment because of racing worsening psychotic symptoms and frequent phone calls with med changes since he was last here  When seen September 3 he was switched off olanzapine because of EPS and switched on to Saphris which was raised to 5 mg nightly but only took that dose 1 time.  The rest of the time he took only 2.5 mg nightly.   He called back 5 days later stating he wanted to stop it because it was causing him to hold his mouth open and he was having a worsening time with tongue movements and felt "funny".  He was encouraged to give it more time but he would not agree to do so.  He stopped it Friday 9/11 and still felt the funny tense feeling.    Last seen September 01, 2019.  The decision was made to switch from [place] to [place] because of difficulty getting an adequate response and limited side effects from [place].  We also discussed the antipsychotic and the plan was to start [place] but those samples were not available so he decided to restart Saphris 2.5 mg nightly.  Started [name] but still has mouth opening and tongue movements but is better.  Varies.  He took Saphris 5mg  since he was here.  The tongue movements got worse.  The fearful thoughts and worry got some better.    Especially after lunch when lays down to rest has problem with  Thoughts are "fair", including  Occ fearful worry thoughts including last night that pastor might preach again against him.  "Apprehensive".  Felt  really tense and anxious last night.  Didn't sleep well.  Doesn't feel depressed. Diminished function bc low motivation.  Pt reports that mood is Not deeply depressed but no sense of well-being..  Anxiety is present and rated as moderate +. Less of the  Shame is primary mood sx. Anxiety symptoms include: rumination and obsessing as noted.  Stays in bed a lot in the morning until about 10:30 still bc doesn't want to face the day  Not sad.  Afraid. Pt reports that appetite is better.  Has tried to regain weight.  At 129#.  Lowest was 120# and normal is 140#. Pt reports that energy is poor but better and loss of interest or pleasure in usual activities, poor motivation and withdrawn from usual activities. Concentration is down slightly. Suicidal thoughts:  denied by patient.  Walks at 2:30 and feels better after that.  No history of suicide attempts.  Not doing much.  Son Neil [name] living with them.  Become more committed [place].  He has schizophrenia.   Past Psychiatric Medication Trials: Mirtazapine, olanzapine 5 EPS, perphenazine started TD with mouth px, Saphris 5 mg and stopped due to EPS complaints and feeling "funny"  buspirone,  amitriptyline 25, risperidone 2 tremors  Ingrezza and [name]  Review of Systems:  Review of Systems  Constitutional: Positive for appetite change.  Gastrointestinal: Negative for constipation.  Neurological: Positive for tremors and weakness. Negative for dizziness and syncope.       No falls   Psychiatric/Behavioral: Positive for decreased concentration, depression, dysphoric mood and sleep disturbance. Negative for agitation, behavioral problems, confusion, hallucinations and self-injury. The patient is nervous/anxious. The patient is not hyperactive.     Medications: I have reviewed the patient's current medications.  Current Outpatient Medications  Medication Sig Dispense Refill  . Asenapine Maleate (SAPHRIS) 2.5 MG SUBL Place 5 mg under the tongue daily.      . Deutetrabenazine ([name]) 9 MG TABS Take 9 mg by mouth 2 (two) times daily.    Marland Kitchen docusate sodium (COLACE) 100 MG capsule Take 1 capsule (100 mg total) by mouth every 12 (twelve) hours. 60 capsule 0  . Multiple Vitamin (MULTIVITAMIN WITH MINERALS) TABS tablet Take 1 tablet by mouth daily.    . sertraline (ZOLOFT) 50 MG tablet TAKE 1 & 1/2 TABLETS BY MOUTH DAILY 45 tablet 2   No current facility-administered medications for this visit.     Medication Side Effects: None  Allergies:  Allergies  Allergen Reactions  . Aspirin Swelling and Other (See Comments)    Reaction:  Tongue swelling   . Sulfur Other (See Comments)    Reaction:  Unknown   . Tetanus Toxoids Swelling and Other (See Comments)    Reaction:  Arm swelling    Past Medical History:  Diagnosis Date  . Anxiety   . Depression     History reviewed. No pertinent family history.  Social History   Socioeconomic History  . Marital status: Married    Spouse name: Not on file  . Number of children: Not on file  . Years of education: Not on file  . Highest education level: Not on file  Occupational History  . Not on file  Social Needs  . Financial resource strain: Not on file  . Food insecurity    Worry: Not on file    Inability: Not on file  . Transportation needs    Medical: Not on file    Non-medical: Not on file  Tobacco Use  . Smoking status: Never Smoker  . Smokeless tobacco: Never Used  Substance and Sexual Activity  . Alcohol use: No  . Drug use: Not on file  . Sexual activity: Not on file  Lifestyle  . Physical activity    Days per week: Not on file    Minutes per session: Not on file  . Stress: Not on file  Relationships  . Social Herbalist on phone: Not on file    Gets together: Not on file    Attends religious service: Not on file    Active member of club or organization: Not on file    Attends meetings of clubs or organizations: Not on file    Relationship status: Not on  file  . Intimate partner violence    Fear of current or ex partner: Not on file    Emotionally abused: Not on file    Physically abused: Not on file    Forced sexual activity: Not on file  Other Topics Concern  . Not on file  Social History Narrative  . Not on file    Past Medical History, Surgical history, Social history, and Family history were reviewed and updated as appropriate.   Please see review of systems for further details on the patient's review from today.   Objective:   Physical Exam:  There were no  vitals taken for this visit.  Physical Exam Constitutional:      General: He is not in acute distress.    Appearance: He is well-developed.  Musculoskeletal:        General: No deformity.  Neurological:     Mental Status: He is alert and oriented to person, place, and time.     Motor: Tremor present.     Coordination: Coordination normal.     Gait: Gait abnormal.  Psychiatric:        Attention and Perception: Attention and perception normal. He does not perceive auditory or visual hallucinations.        Mood and Affect: Mood is anxious and depressed. Affect is blunt. Affect is not labile, angry or inappropriate.        Speech: Speech is delayed. Speech is not rapid and pressured, slurred or tangential.        Behavior: Behavior is slowed.        Thought Content: Thought content is paranoid. Thought content does not include homicidal or suicidal ideation. Thought content does not include homicidal or suicidal plan.        Cognition and Memory: Cognition and memory normal.        Judgment: Judgment normal.     Comments: Insight intact. No delusions.  There is noticeable psychomotor retardation but is not extreme. Not markedly parkinsonian. Paranoia worse about salvation.     Lab Review:  No results found for: NA, K, CL, CO2, GLUCOSE, BUN, CREATININE, CALCIUM, PROT, ALBUMIN, AST, ALT, ALKPHOS, BILITOT, GFRNONAA, GFRAA  No results found for: WBC, RBC, HGB, HCT,  PLT, MCV, MCH, MCHC, RDW, LYMPHSABS, MONOABS, EOSABS, BASOSABS I will  No results found for: POCLITH, LITHIUM   No results found for: PHENYTOIN, PHENOBARB, VALPROATE, CBMZ   .res Assessment: Plan:    Neil [name] was seen today for follow-up, depression and tardive dyskines.  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 We discussed Patient has history of severe recurrent major depression with psychotic features.  There are also elements of OCD.  He is paranoid and anxious but now feel he denies feeling significantly sad or depressed though he describes a sense of being unwell.Marland Kitchen  His son has schizophrenia so there may be a genetic predisposition to psychosis.  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  He ended up having too much parkinsonian symptoms so olanzapine and that was discontinued.    Again his depression is better with the sertraline but I do not believe it is resolved.  Would prefer to find an antipsychotic with low EPS risks but that is proving difficult.  He did not tolerate olanzapine nor Saphris.  Other meds with low EPS risk include Seroquel but there is risk of significant sedation or Fanapt but there is risk of orthostatic hypotension. Clozapine option with obvious concerns.  Asked about Caplyta.  Consider Latuda bc also helps depression.  Continue sertraline to 75 daily.  He says he's less depressed.  Disc SE in detail and SSRI withdrawal sx.  Consider increase bc still has anhedonia.  Continue [name] titration bc of continued sx.  Increase to 12 mg BID  Discussed  the dosing titration schedule with  [name].  Because he could have worsening of TD symptoms before they eventually improve.   Saphris 5 helped anxiety some and worry but not very much. Change Saphris to  Caplyta 42 mg samples.  Both have low EPS risks. We discussed other alternatives as well.  He is needs a med with low EPS risk if possible.  Discussed side effects of each medication.  Discussed potential metabolic side effects associated with atypical antipsychotics, as well as potential risk for movement side effects. Advised pt to contact office if movement side effects occur.   Supportive therapy dealing with the trauma of the prior intrusive sensual psychotic experience noted above.  Feels shame over that.  Has still ruminated over it.  He now denies any auditory hallucinations but had worsening paranoia about losing his salvation especially in the last few days.  Rec walk earlier in day to try to help mood.  Greater than 50% of 30-minute face to face time with patient was spent on counseling and coordination of care.   FU 2 weeks.  Lynder Parents, MD, DFAPA     No future appointments.  No orders of the defined types were placed in this encounter.     -------------------------------

## 2019-09-17 ENCOUNTER — Telehealth: Payer: Self-pay

## 2019-09-17 NOTE — Telephone Encounter (Signed)
Prior authorization submitted and approved for [name] 12 mg #60 effective  from 12/14/2018 - 12/14/2019, through [facility]

## 2019-09-21 ENCOUNTER — Ambulatory Visit: Payer: Medicare HMO | Admitting: Psychiatry

## 2019-09-21 ENCOUNTER — Telehealth: Payer: Self-pay | Admitting: Psychiatry

## 2019-09-21 NOTE — Telephone Encounter (Signed)
On Caplyta,  Thinks it is too strong.  His movements are slow, sleeping too much, drowsy during day ( doesn't really wake up until 4pm after sleeping all night). Takes him along time to think and respond. Had trouble with mouth and jaw before, and now he is holding his mouth open all the time.  Paranoia is better though.  Please call wife, Vaughan Basta at home number.

## 2019-09-21 NOTE — Telephone Encounter (Signed)
Skip the medicine for 2 days then start taking it every other day or Monday Wednesday Friday

## 2019-09-22 NOTE — Telephone Encounter (Signed)
Patient is asking if his appointment October 16 can be telehealth.  Please tell him no.  See him in person at his appointment because he is having movement disorder side effects and there is no way I can evaluate that over the phone.  I need to be able to check the muscle tone in his arms etc. and I cannot do that over the phone.

## 2019-09-27 DIAGNOSIS — R69 Illness, unspecified: Secondary | ICD-10-CM | POA: Diagnosis not present

## 2019-09-29 ENCOUNTER — Encounter: Payer: Self-pay | Admitting: Psychiatry

## 2019-09-29 ENCOUNTER — Other Ambulatory Visit: Payer: Self-pay

## 2019-09-29 ENCOUNTER — Ambulatory Visit (INDEPENDENT_AMBULATORY_CARE_PROVIDER_SITE_OTHER): Payer: Medicare HMO | Admitting: Psychiatry

## 2019-09-29 DIAGNOSIS — F251 Schizoaffective disorder, depressive type: Secondary | ICD-10-CM

## 2019-09-29 DIAGNOSIS — F061 Catatonic disorder due to known physiological condition: Secondary | ICD-10-CM

## 2019-09-29 DIAGNOSIS — R69 Illness, unspecified: Secondary | ICD-10-CM | POA: Diagnosis not present

## 2019-09-29 DIAGNOSIS — F411 Generalized anxiety disorder: Secondary | ICD-10-CM

## 2019-09-29 DIAGNOSIS — G2401 Drug induced subacute dyskinesia: Secondary | ICD-10-CM

## 2019-09-29 MED ORDER — AUSTEDO 9 MG PO TABS: 18.0000 mg | ORAL_TABLET | Freq: Two times a day (BID) | ORAL | 0 refills | Status: DC

## 2019-09-29 NOTE — Progress Notes (Signed)
MELO OSMANI YO:[id number] 11/10/1941 78 y.o.     Subjective:   Patient ID:  Neil [name] is a 78 y.o. (DOB 1941/11/01) male.  Chief Complaint:  Chief Complaint  Patient presents with  . Follow-up    psychosis and med change and TD    Depression        Associated symptoms include decreased concentration and appetite change.   Neil [name] presents today for follow-up of psychosis, depression, and anxiety.  He has required an urgent appointment because of racing worsening psychotic symptoms and frequent phone calls with med changes since he was last here  When seen September 3 he was switched off olanzapine because of EPS and switched on to Saphris which was raised to 5 mg nightly but only took that dose 1 time.  The rest of the time he took only 2.5 mg nightly.   He called back 5 days later stating he wanted to stop it because it was causing him to hold his mouth open and he was having a worsening time with tongue movements and felt "funny".  He was encouraged to give it more time but he would not agree to do so.  He stopped it Friday 9/11 and still felt the funny tense feeling.    When  seen September 01, 2019.  The decision was made to switch from [place] to [place] because of difficulty getting an adequate response and limited side effects from [place].  We also discussed the antipsychotic and the plan was to start [place] but those samples were not available so he decided to restart Saphris 2.5 mg nightly.  At the last visit September 15, 2011 [name] was increased to 12 mg twice daily, Saphris was discontinued and Caplyta 42 mg daily was started because of his low EPS risk.  He still needed an antipsychotic.  He continued on sertraline 75 mg daily because he felt it helped his depression.  Wife called 10/8 and said the following: On Caplyta,  Thinks it is too strong.  His movements are slow, sleeping too much, drowsy during day ( doesn't really wake up until 4pm after sleeping  all night). Takes him along time to think and respond. Had trouble with mouth and jaw before, and now he is holding his mouth open all the time.  Paranoia is better though. Was told to take it 3 days weekly.    Reports sedation is better with the reduction. TD sx seem better today.  Worse if tense and anxious.  Overall doesn't think Renee Harder is working well.  Tongue still does it's thing and mouth stays open a lot.  Tongue moves a lot and worse when stressed.  Will get stressed when thinks about pastor at church.  Can't seem to get rid of those thoughts.  There most all the time.  Thinks pastor is thinking badly of him.  Wife thinks there's nothing to it.  Especially after lunch when lays down to rest has problem with  Thoughts are "fair", including  Occ fearful worry thoughts including last night that pastor might preach again against him.  "Apprehensive".  Felt really tense and anxious often.  Sleep fair but enough.  Doesn't feel depressed. Diminished function bc low motivation.  Pt reports that mood is Not deeply depressed but no sense of well-being..  Anxiety is present and rated as moderate +. Less of the  Shame is primary mood sx. Anxiety symptoms include: rumination and obsessing as noted.  Stays in bed a  lot in the morning until about 10:30 still bc doesn't want to face the day  Not sad.  Afraid. Pt reports that appetite is better.  Has tried to regain weight.  At 129#.  Lowest was 120# and normal is 140#. Pt reports that energy is poor but better and loss of interest or pleasure in usual activities, poor motivation and withdrawn from usual activities. Concentration is down slightly. Suicidal thoughts:  denied by patient.  Walks at 2:30 and feels better after that.  No history of suicide attempts.  Not doing much.  Son Neil [name] living with them.  Become more committed [place].  He has schizophrenia.   Past Psychiatric Medication Trials: Mirtazapine, olanzapine 5 EPS, perphenazine started TD with  mouth px, Saphris 5 mg and stopped due to EPS complaints and feeling "funny"  buspirone,  amitriptyline 25, risperidone 2 tremors  Ingrezza and [name]  Review of Systems:  Review of Systems  Constitutional: Positive for appetite change.  Gastrointestinal: Negative for constipation.  Neurological: Positive for tremors and weakness. Negative for dizziness and syncope.       No falls   Psychiatric/Behavioral: Positive for decreased concentration, depression, dysphoric mood and sleep disturbance. Negative for agitation, behavioral problems, confusion, hallucinations and self-injury. The patient is nervous/anxious. The patient is not hyperactive.     Medications: I have reviewed the patient's current medications.  Current Outpatient Medications  Medication Sig Dispense Refill  . docusate sodium (COLACE) 100 MG capsule Take 1 capsule (100 mg total) by mouth every 12 (twelve) hours. 60 capsule 0  . Lumateperone Tosylate (CAPLYTA) 42 MG CAPS Take 42 mg by mouth every other day.    . Multiple Vitamin (MULTIVITAMIN WITH MINERALS) TABS tablet Take 1 tablet by mouth daily.    . sertraline (ZOLOFT) 50 MG tablet TAKE 1 & 1/2 TABLETS BY MOUTH DAILY 45 tablet 2  . Deutetrabenazine ([name]) 9 MG TABS Take 18 mg by mouth 2 (two) times daily. 120 tablet 0   No current facility-administered medications for this visit.     Medication Side Effects: None  Allergies:  Allergies  Allergen Reactions  . Aspirin Swelling and Other (See Comments)    Reaction:  Tongue swelling   . Sulfur Other (See Comments)    Reaction:  Unknown   . Tetanus Toxoids Swelling and Other (See Comments)    Reaction:  Arm swelling    Past Medical History:  Diagnosis Date  . Anxiety   . Depression     History reviewed. No pertinent family history.  Social History   Socioeconomic History  . Marital status: Married    Spouse name: Not on file  . Number of children: Not on file  . Years of education: Not on file   . Highest education level: Not on file  Occupational History  . Not on file  Social Needs  . Financial resource strain: Not on file  . Food insecurity    Worry: Not on file    Inability: Not on file  . Transportation needs    Medical: Not on file    Non-medical: Not on file  Tobacco Use  . Smoking status: Never Smoker  . Smokeless tobacco: Never Used  Substance and Sexual Activity  . Alcohol use: No  . Drug use: Not on file  . Sexual activity: Not on file  Lifestyle  . Physical activity    Days per week: Not on file    Minutes per session: Not on file  . Stress: Not  on file  Relationships  . Social Herbalist on phone: Not on file    Gets together: Not on file    Attends religious service: Not on file    Active member of club or organization: Not on file    Attends meetings of clubs or organizations: Not on file    Relationship status: Not on file  . Intimate partner violence    Fear of current or ex partner: Not on file    Emotionally abused: Not on file    Physically abused: Not on file    Forced sexual activity: Not on file  Other Topics Concern  . Not on file  Social History Narrative  . Not on file    Past Medical History, Surgical history, Social history, and Family history were reviewed and updated as appropriate.   Please see review of systems for further details on the patient's review from today.   Objective:   Physical Exam:  There were no vitals taken for this visit.  Physical Exam Constitutional:      General: He is not in acute distress.    Appearance: He is well-developed.  Musculoskeletal:        General: No deformity.  Neurological:     Mental Status: He is alert and oriented to person, place, and time.     Motor: Tremor present.     Coordination: Coordination normal.     Gait: Gait abnormal.  Psychiatric:        Attention and Perception: Attention and perception normal. He does not perceive auditory or visual hallucinations.         Mood and Affect: Mood is anxious and depressed. Affect is blunt. Affect is not labile, angry or inappropriate.        Speech: Speech is delayed. Speech is not rapid and pressured, slurred or tangential.        Behavior: Behavior is slowed.        Thought Content: Thought content is paranoid. Thought content does not include homicidal or suicidal ideation. Thought content does not include homicidal or suicidal plan.        Cognition and Memory: Cognition and memory normal.        Judgment: Judgment normal.     Comments: Insight intact. No delusions.  There is noticeable psychomotor retardation but is not extreme. Not markedly parkinsonian. Paranoia worse about salvation.     Lab Review:  No results found for: NA, K, CL, CO2, GLUCOSE, BUN, CREATININE, CALCIUM, PROT, ALBUMIN, AST, ALT, ALKPHOS, BILITOT, GFRNONAA, GFRAA  No results found for: WBC, RBC, HGB, HCT, PLT, MCV, MCH, MCHC, RDW, LYMPHSABS, MONOABS, EOSABS, BASOSABS I will  No results found for: POCLITH, LITHIUM   No results found for: PHENYTOIN, PHENOBARB, VALPROATE, CBMZ   .res Assessment: Plan:    [facility] was seen today for follow-up.  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     Greater than 50% of 30-minute face to face time with patient was spent on counseling and coordination of care. We discussed Patient has history of severe recurrent major depression with psychotic features.  There are also elements of OCD.  He is paranoid and anxious but now feel he denies feeling significantly sad or depressed  though he describes a sense of being unwell.Marland Kitchen  His son has schizophrenia so there may be a genetic predisposition to psychosis.  I am suspecting that he has paranoid  schizophrenia that has been previously diagnosed as depression with psychosis but in either case he needs an antipsychotic.  He ended up having too much parkinsonian symptoms so olanzapine and that was discontinued.    Again his depression is better with the sertraline but I do not believe it is resolved.  Would prefer to find an antipsychotic with low EPS risks but that is proving difficult.  He did not tolerate olanzapine nor Saphris.  Other meds with low EPS risk include Seroquel but there is risk of significant sedation or Fanapt but there is risk of orthostatic hypotension. Clozapine option with obvious concerns.  Asked about Caplyta.  Consider Latuda bc also helps depression.  Continue sertraline to 75 daily.  He says he's less depressed.  Disc SE in detail and SSRI withdrawal sx.  Consider increase bc still has anhedonia.  Continue [name] titration bc of continued sx.  Increase to 18 mg BID  Discussed the dosing titration schedule with [name].  Because he could have worsening of TD symptoms before they eventually improve.   Continue Caplyta 42 mg every other day.  Too sedated with daily.  Otherwise tolerating it. We discussed other alternatives as well.  He is needs a med with low EPS risk if possible.  Discussed side effects of each medication.  Discussed potential metabolic side effects associated with atypical antipsychotics, as well as potential risk for movement side effects. Advised pt to contact office if movement side effects occur.   Supportive therapy dealing with the trauma of the prior intrusive sensual psychotic experience noted above. Disc his paranoia and how difficult it is for him to handle.  Needs more time on Capylta.  Rec walk earlier in day to try to help mood.  Greater than 50% of 30-minute face to face time with patient was spent on counseling and coordination of care.   FU 3 weeks.  Lynder Parents, MD, DFAPA     No future appointments.  No orders of the  defined types were placed in this encounter.     -------------------------------

## 2019-10-05 ENCOUNTER — Ambulatory Visit: Payer: Medicare HMO | Admitting: Psychiatry

## 2019-10-13 DIAGNOSIS — R69 Illness, unspecified: Secondary | ICD-10-CM | POA: Diagnosis not present

## 2019-10-23 ENCOUNTER — Telehealth: Payer: Self-pay | Admitting: Psychiatry

## 2019-10-23 NOTE — Telephone Encounter (Signed)
PT calling to update on meds. The two new meds he is taking are not working.  They make him fidgety, he can't rest or relax at all, and his tongue is moving a lot. Wants you to know before appt with you Wed. 10/25/2019

## 2019-10-23 NOTE — Telephone Encounter (Signed)
Tell him to stop Caplyta

## 2019-10-24 NOTE — Telephone Encounter (Signed)
Gave pt's wife the info to stop taking Caplyta.

## 2019-10-25 ENCOUNTER — Other Ambulatory Visit: Payer: Self-pay

## 2019-10-25 ENCOUNTER — Encounter: Payer: Self-pay | Admitting: Psychiatry

## 2019-10-25 ENCOUNTER — Ambulatory Visit (INDEPENDENT_AMBULATORY_CARE_PROVIDER_SITE_OTHER): Payer: Medicare HMO | Admitting: Psychiatry

## 2019-10-25 ENCOUNTER — Encounter

## 2019-10-25 DIAGNOSIS — G2401 Drug induced subacute dyskinesia: Secondary | ICD-10-CM

## 2019-10-25 DIAGNOSIS — F061 Catatonic disorder due to known physiological condition: Secondary | ICD-10-CM | POA: Diagnosis not present

## 2019-10-25 DIAGNOSIS — R69 Illness, unspecified: Secondary | ICD-10-CM | POA: Diagnosis not present

## 2019-10-25 DIAGNOSIS — F251 Schizoaffective disorder, depressive type: Secondary | ICD-10-CM

## 2019-10-25 DIAGNOSIS — F411 Generalized anxiety disorder: Secondary | ICD-10-CM | POA: Diagnosis not present

## 2019-10-25 DIAGNOSIS — F333 Major depressive disorder, recurrent, severe with psychotic symptoms: Secondary | ICD-10-CM

## 2019-10-25 NOTE — Progress Notes (Signed)
Neil [name] [id number] [birth year] 78 y.o.     Subjective:   Neil [name] ID:  Neil [name] is a 78 y.o. (DOB [birth year]) male.  Chief Complaint:  Chief Complaint  Neil [name] presents with  . Follow-up    Medication Management  . Medication Problem  . Paranoid  . tongue movements    Depression        Associated symptoms include decreased concentration and appetite change.   Neil [name] presents today for follow-up of psychosis, depression, and anxiety.  Neil [name] has required an urgent appointment because of racing worsening psychotic symptoms and frequent phone calls with med changes since Neil [name] was last here  When seen September 3 Neil [name] was switched off olanzapine because of EPS and switched on to Saphris which was raised to 5 mg nightly but only took that dose 1 time.  The rest of the time Neil [name] took only 2.5 mg nightly.   Neil [name] called back 5 days later stating Neil [name] wanted to stop it because it was causing him to hold his mouth open and Neil [name] was having a worsening time with tongue movements and felt "funny".  Neil [name] was encouraged to give it more time but Neil [name] would not agree to do so.  Neil [name] stopped it Friday 9/11 and still felt the funny tense feeling.    When  seen September 01, 2019.  The decision was made to switch from [place] to [place] because of difficulty getting an adequate response and limited side effects from [place].  We also discussed the antipsychotic and the plan was to start [place] but those samples were not available so Neil [name] decided to restart Saphris 2.5 mg nightly.  At the visit September 15, 2011 [name] was increased to 12 mg twice daily, Saphris was discontinued and Caplyta 42 mg daily was started because of his low EPS risk.  Neil [name] still needed an antipsychotic.  Neil [name] continued on sertraline 75 mg daily because Neil [name] felt it helped his depression.  Wife called 10/8 and said the following: On Caplyta,  Thinks it is too strong.  His movements are slow, sleeping too much, drowsy during day ( doesn't  really wake up until 4pm after sleeping all night). Takes him along time to think and respond. Had trouble with mouth and jaw before, and now Neil [name] is holding his mouth open all the time.  Paranoia is better though. Was told to take it 3 days weekly.    October 16 [name] increased to 18 mg BID  Called again November 9 stating Neil [name] couldn't tolerate either med.  Neil [name] stopped the Caplyta and reduced the [name] to 9 mg BID a few days ago.  CO rigidity in lower back and arms.  Still bothered by constant tongue movements.  No change in worry, mood but paranoia is reduced since Caplyta.  Sleep 8 hour but stays in bed a lot.      Worse if tense and anxious.  Overall doesn't think Renee Harder is working well.  Tongue still does it's thing and mouth stays open a lot.  Tongue moves a lot and worse when stressed.    Neil [name] reports that mood is Not deeply depressed but no sense of well-being..  Anxiety is present and rated as moderate.  Stays in bed a lot in the morning until about 10:30 still bc doesn't want to face the day  Not sad.. Neil [name] reports that appetite is better.  Has tried to regain weight.  At 129#.  Lowest was 120# and normal is  140#. Neil [name] reports that energy is poor but better and loss of interest or pleasure in usual activities, poor motivation and withdrawn from usual activities. Concentration is down slightly. Suicidal thoughts:  denied by Neil [name].  Walks at 2:30 and feels better after that.  No history of suicide attempts.  Not doing much.  Son [name] living with them.  Become more committed [place].  Neil [name] has schizophrenia.   Past Psychiatric Medication Trials: Mirtazapine,  olanzapine 5 EPS, perphenazine started TD with mouth px,  Saphris 5 mg and stopped due to EPS complaints and feeling "funny" Caplyta side effects risperidone 2 tremors  Buspirone,  amitriptyline 25,   Ingrezza and [name]  Review of Systems:  Review of Systems  Constitutional: Positive for appetite change.  Gastrointestinal:  Negative for constipation.  Neurological: Positive for tremors and weakness. Negative for dizziness and syncope.       No falls   Psychiatric/Behavioral: Positive for decreased concentration, depression, dysphoric mood and sleep disturbance. Negative for agitation, behavioral problems, confusion, hallucinations and self-injury. The Neil [name] is nervous/anxious. The Neil [name] is not hyperactive.     Medications: I have reviewed the Neil [name]'s current medications.  Current Outpatient Medications  Medication Sig Dispense Refill  . Deutetrabenazine ([name]) 9 MG TABS Take 18 mg by mouth 2 (two) times daily. (Neil [name] taking differently: Take 9 mg by mouth 2 (two) times daily. ) 120 tablet 0  . docusate sodium (COLACE) 100 MG capsule Take 1 capsule (100 mg total) by mouth every 12 (twelve) hours. 60 capsule 0  . Multiple Vitamin (MULTIVITAMIN WITH MINERALS) TABS tablet Take 1 tablet by mouth daily.    . sertraline (ZOLOFT) 50 MG tablet TAKE 1 & 1/2 TABLETS BY MOUTH DAILY 45 tablet 2   No current facility-administered medications for this visit.     Medication Side Effects: None  Allergies:  Allergies  Allergen Reactions  . Aspirin Swelling and Other (See Comments)    Reaction:  Tongue swelling   . Sulfur Other (See Comments)    Reaction:  Unknown   . Tetanus Toxoids Swelling and Other (See Comments)    Reaction:  Arm swelling    Past Medical History:  Diagnosis Date  . Anxiety   . Depression     History reviewed. No pertinent family history.  Social History   Socioeconomic History  . Marital status: Married    Spouse name: Not on file  . Number of children: Not on file  . Years of education: Not on file  . Highest education level: Not on file  Occupational History  . Not on file  Social Needs  . Financial resource strain: Not on file  . Food insecurity    Worry: Not on file    Inability: Not on file  . Transportation needs    Medical: Not on file    Non-medical: Not on  file  Tobacco Use  . Smoking status: Never Smoker  . Smokeless tobacco: Never Used  Substance and Sexual Activity  . Alcohol use: No  . Drug use: Not on file  . Sexual activity: Not on file  Lifestyle  . Physical activity    Days per week: Not on file    Minutes per session: Not on file  . Stress: Not on file  Relationships  . Social Herbalist on phone: Not on file    Gets together: Not on file    Attends religious service: Not on file    Active member of club  or organization: Not on file    Attends meetings of clubs or organizations: Not on file    Relationship status: Not on file  . Intimate partner violence    Fear of current or ex partner: Not on file    Emotionally abused: Not on file    Physically abused: Not on file    Forced sexual activity: Not on file  Other Topics Concern  . Not on file  Social History Narrative  . Not on file    Past Medical History, Surgical history, Social history, and Family history were reviewed and updated as appropriate.   Please see review of systems for further details on the Neil [name]'s review from today.   Objective:   Physical Exam:  There were no vitals taken for this visit.  Physical Exam Constitutional:      General: Neil [name] is not in acute distress.    Appearance: Neil [name] is well-developed.  Musculoskeletal:        General: No deformity.  Neurological:     Mental Status: Neil [name] is alert and oriented to person, place, and time.     Motor: Tremor present.     Coordination: Coordination normal.     Gait: Gait abnormal.     Comments: Minimal cogwheel rigidity  Psychiatric:        Attention and Perception: Attention and perception normal. Neil [name] does not perceive auditory or visual hallucinations.        Mood and Affect: Mood is anxious and depressed. Affect is blunt. Affect is not labile, angry or inappropriate.        Speech: Speech is delayed. Speech is not rapid and pressured, slurred or tangential.        Behavior: Behavior  is slowed.        Thought Content: Thought content is paranoid. Thought content does not include homicidal or suicidal ideation. Thought content does not include homicidal or suicidal plan.        Cognition and Memory: Cognition and memory normal.        Judgment: Judgment normal.     Comments: Insight intact. No delusions.  There is noticeable psychomotor retardation but is not extreme. Not markedly parkinsonian. Paranoia improved about salvation.     Lab Review:  No results found for: NA, K, CL, CO2, GLUCOSE, BUN, CREATININE, CALCIUM, PROT, ALBUMIN, AST, ALT, ALKPHOS, BILITOT, GFRNONAA, GFRAA  No results found for: WBC, RBC, HGB, HCT, PLT, MCV, MCH, MCHC, RDW, LYMPHSABS, MONOABS, EOSABS, BASOSABS I will  No results found for: POCLITH, LITHIUM   No results found for: PHENYTOIN, PHENOBARB, VALPROATE, CBMZ   .res Assessment: Plan:    Neil [name] was seen today for follow-up, medication problem, paranoid and tongue movements.  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 We discussed Neil [name] has history of severe recurrent major depression with psychotic features.  There are also elements of OCD.  Neil [name] is paranoid and anxious but now feel Neil [name] denies feeling significantly sad or depressed though Neil [name] describes a sense of being unwell.Marland Kitchen  His son has schizophrenia so there may be a genetic predisposition to psychosis.  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  Neil [name] ended up having too much parkinsonian symptoms so olanzapine and that was  discontinued.  Recently Neil [name] called complaining of side effects from [place] and stopped it as well.  Neil [name] still needs an antipsychotic but is intolerant.  However this is complicated because Neil [name] is mis. attributing symptoms to medications such as his back pain to medication which is highly unlikely.  Paranoia is better at present but will worsen the longer Neil [name]'s off the antipsychotic.  Of note Neil [name] has reduced arm swing and psychomotor retardation to some degree but also his son has the same and son's sx predated any antipsychotic.  Confronted Neil [name] regarding changing meds on his own.  This is making it very difficult to evaluate benefit and side effects of medications and dosages.  Ask him not to make any further med changes on his own.  Neil [name] agrees.  For cost reasons and low EPS would like to use loxapine but it may not be available.  It is been a shortage lately.  Another option would be quetiapine with low EPS but is fairly sedating not sure Neil [name] can tolerate it.  Consider Latuda but it has a fair EPS risk as well.  However it might also help his depression allowing for less sertraline.  Again his depression is better with the sertraline but I do not believe it is resolved.  Would prefer to find an antipsychotic with low EPS risks but that is proving difficult.  Neil [name] did not tolerate olanzapine nor Saphris.  Other meds with low EPS risk include Seroquel but there is risk of significant sedation or Fanapt but there is risk of orthostatic hypotension. Clozapine option with obvious concerns.  Asked about Caplyta.  Consider Latuda bc also helps depression.  Continue sertraline to 75 daily.  Neil [name] says Neil [name]'s less depressed.  Disc SE in detail and SSRI withdrawal sx.  Consider increase bc still has anhedonia.  Spending too much time in bed.  This may be contributing to his complaints of back pain.  Discussed the dosing titration schedule with [name].  Because Neil [name] could have worsening of TD symptoms before they  eventually improve.   In order to better evaluate benefit and side effect ratio is with medications med changes need to be made more slowly.  Neil [name] just stopped Caplyta and just reduced [name] to 9 mg twice daily.  Therefore we will not make any other med changes today.  We need to give this 2 to 3 weeks to evaluate.  It may take even longer for the paranoia were to worsen off the [place] but side effects should be markedly reduced if there are any by that point.  No new antipsychotics started today. Continue [name] 9 mg twice daily.  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 Continue sertraline 75 mg daily.  Discussed side effects of each medication.  Discussed potential metabolic side effects associated with atypical antipsychotics, as well as potential risk for movement side effects. Advised Neil [name] to contact office if movement side effects occur.   Supportive therapy dealing with the trauma of the prior intrusive sensual psychotic experience noted above. Disc his paranoia and how difficult it is for him to handle.  Needs more time on Capylta.  Rec walk earlier in day to try to help mood.  Greater than 50% of 30-minute face to face time  with Neil [name] was spent on counseling and coordination of care.   FU 2-3 weeks.  Lynder Parents, MD, DFAPA     Future Appointments  Date Time Provider [place]  11/17/2019  1:30 PM Cottle, [place]ley [place]., MD [facility] None    No orders of the defined types were placed in this encounter.     -------------------------------

## 2019-10-25 NOTE — Patient Instructions (Signed)
Stop Caplyta  Continue [name] 9 mg twice daily

## 2019-10-26 DIAGNOSIS — J343 Hypertrophy of nasal turbinates: Secondary | ICD-10-CM | POA: Diagnosis not present

## 2019-10-26 DIAGNOSIS — H9011 Conductive hearing loss, unilateral, right ear, with unrestricted hearing on the contralateral side: Secondary | ICD-10-CM | POA: Diagnosis not present

## 2019-10-26 DIAGNOSIS — H6123 Impacted cerumen, bilateral: Secondary | ICD-10-CM | POA: Diagnosis not present

## 2019-11-06 ENCOUNTER — Telehealth: Payer: Self-pay | Admitting: Psychiatry

## 2019-11-06 ENCOUNTER — Other Ambulatory Visit: Payer: Self-pay | Admitting: Psychiatry

## 2019-11-06 MED ORDER — CLONAZEPAM 0.5 MG PO TABS: 0.5000 mg | ORAL_TABLET | Freq: Two times a day (BID) | ORAL | 0 refills | Status: DC

## 2019-11-06 NOTE — Telephone Encounter (Signed)
Neil [name] called to report that he is very restless and can't rest.  Is there something you can prescribe to help him rest? Next appt 11/17/19

## 2019-11-06 NOTE — Telephone Encounter (Signed)
Patient has had treatment resistant restlessness that is failed to respond to multiple meds.  He has not taken benzodiazepines and it looks like that will be necessary.  Prescribe clonazepam 0.5 mg tablets 1 in the morning and 1 at 6 PM

## 2019-11-07 ENCOUNTER — Other Ambulatory Visit: Payer: Self-pay | Admitting: Psychiatry

## 2019-11-07 NOTE — Telephone Encounter (Signed)
We need to make 1 change at a time.  We've been changing meds so much I can't be sure what are his baseline sx and what might be SE of meds.  Just add the clonazepam.  It should help the restlessness.

## 2019-11-07 NOTE — Telephone Encounter (Signed)
Left voice mail

## 2019-11-08 NOTE — Telephone Encounter (Signed)
We cannot solve his restlessness and mouth movement concerns at the same time especially with frequent med changes between appts.  If he stops the [name], then his mouth movements will get worse.

## 2019-11-08 NOTE — Telephone Encounter (Signed)
Neil [name] called back again today at 3:08 stating that the medicine he got yesterday did not help.  He was not able to sleep and is still not able to rest.  Please call.  Appt 12/4

## 2019-11-08 NOTE — Telephone Encounter (Signed)
Pt. Made aware. He is thinking that the Renee Harder is what is making him so restless. He would like to go off of it for a couple days to see if that helps. He is also complaining of an unsteady gate on the Clonazepam. Please advise.

## 2019-11-08 NOTE — Telephone Encounter (Signed)
Pt. Made aware.

## 2019-11-08 NOTE — Telephone Encounter (Signed)
Have him increase the clonazepam to one of the 0.5 mg tablets in the morning, 1 at 6 PM and 1 at bedtime

## 2019-11-17 ENCOUNTER — Other Ambulatory Visit: Payer: Self-pay

## 2019-11-17 ENCOUNTER — Other Ambulatory Visit: Payer: Self-pay | Admitting: Psychiatry

## 2019-11-17 ENCOUNTER — Telehealth: Payer: Self-pay | Admitting: Psychiatry

## 2019-11-17 ENCOUNTER — Encounter: Payer: Self-pay | Admitting: Psychiatry

## 2019-11-17 ENCOUNTER — Ambulatory Visit (INDEPENDENT_AMBULATORY_CARE_PROVIDER_SITE_OTHER): Payer: Medicare HMO | Admitting: Psychiatry

## 2019-11-17 DIAGNOSIS — G2401 Drug induced subacute dyskinesia: Secondary | ICD-10-CM | POA: Diagnosis not present

## 2019-11-17 MED ORDER — AUSTEDO 9 MG PO TABS: 9.0000 mg | ORAL_TABLET | Freq: Two times a day (BID) | ORAL | 1 refills | Status: DC

## 2019-11-17 MED ORDER — AUSTEDO 12 MG PO TABS: 1.0000 | ORAL_TABLET | Freq: Two times a day (BID) | ORAL | 1 refills | Status: DC

## 2019-11-17 NOTE — Telephone Encounter (Signed)
Pt called requested Dr.Cottle to return his call about meds. Did not want to give any more info. Call @  [phone]

## 2019-11-17 NOTE — Progress Notes (Signed)
Patient called back after his appointment today and mist admits he did been dishonest with the physician about what he is taking.  Unfortunately this is been an ongoing and chronic problem.  Patient is noncompliant to change his medications on his own.  He is on the verge of being discharged from the practice for noncompliance.  It is making it nearly impossible to act accurately treat him because he gives false information about what meds he is taking.  He now claims that he has not been taking [name] 9 mg twice a day every day he has been taking it once a day and sometimes taking it twice a day.  Therefore increased to 12 mg twice a day may be too much of an increase.  So we will increase to 9 mg twice daily.  If he continues noncompliance he will be discharged from the practice.

## 2019-11-17 NOTE — Telephone Encounter (Signed)
Canceled [name] 12 mg prescription to [facility] and sent in 9 mg twice daily prescription to [facility] in its place.

## 2019-11-17 NOTE — Progress Notes (Signed)
Neil [name] YO:[id number] 1941/10/18 78 y.o.     Subjective:   Patient ID:  Neil [name] is a 78 y.o. (DOB 06-Jul-1941) male.  Chief Complaint:  Chief Complaint  Patient presents with  . Follow-up    Medication Management  . Depression    Medication Management  . Other    Schizoaffective  . Medication Problem    [name] not working per pt.     Depression        Associated symptoms include decreased concentration.  Associated symptoms include no appetite change.   Neil [name] presents today for follow-up of psychosis, depression, and anxiety.  He has required an urgent appointment because of racing worsening psychotic symptoms and frequent phone calls with med changes since he was last here  When seen September 3 he was switched off olanzapine because of EPS and switched on to Saphris which was raised to 5 mg nightly but only took that dose 1 time.  The rest of the time he took only 2.5 mg nightly.   He called back 5 days later stating he wanted to stop it because it was causing him to hold his mouth open and he was having a worsening time with tongue movements and felt "funny".  He was encouraged to give it more time but he would not agree to do so.  He stopped it Friday 9/11 and still felt the funny tense feeling.    When  seen September 01, 2019.  The decision was made to switch from [place] to [place] because of difficulty getting an adequate response and limited side effects from [place].  We also discussed the antipsychotic and the plan was to start [place] but those samples were not available so he decided to restart Saphris 2.5 mg nightly.  At the visit September 15, 2011 [name] was increased to 12 mg twice daily, Saphris was discontinued and Caplyta 42 mg daily was started because of his low EPS risk.  He still needed an antipsychotic.  He continued on sertraline 75 mg daily because he felt it helped his depression.  Wife called 10/8 and said the following: On Caplyta,   Thinks it is too strong.  His movements are slow, sleeping too much, drowsy during day ( doesn't really wake up until 4pm after sleeping all night). Takes him along time to think and respond. Had trouble with mouth and jaw before, and now he is holding his mouth open all the time.  Paranoia is better though. Was told to take it 3 days weekly.    October 16 [name] increased to 18 mg BID  Called again November 9 stating he couldn't tolerate either med.  He stopped the Caplyta and reduced the [name] to 9 mg BID a few days ago.  CO rigidity in lower back and arms.  Still bothered by constant tongue movements.  No change in worry, mood but paranoia is reduced since Caplyta.  Last visit October 25, 2019.  He had stopped Caplyta.  He had also reduced [name] to 9 mg twice daily on his own.  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  He is very impatient with med changes.  Therefore we did not start a new antipsychotic at the last visit.  He tends to make frequent phone calls and did call again on  November 23 complaining of feeling very restless and difficulty sleeping.  Was prescribed clonazepam 0.5 mg twice daily.  He called back the next day stating he wanted to stop the [name] due to restlessness but he had never taken the prescribed clonazepam.  Using courage to follow the treatment plan and take the clonazepam.  He called again the next day stating he still wanted to stop [name] and the clonazepam was making his gait unsteady.  He was informed that if he stopped the [name] his mouth movements would get worse which is been a leading complaint but he could stop it if he wanted to.  Today, December 4 he reports clonazepam has helped the anxiety and less guilt and less rigidity.  Taking TID and only SE is a little  slower movements.  1 fall, slipped on Ba floor where water was lying.  No injury.   Gained 3# and better appetite for the first time in a long time.  CO tongue movements.  Restlessness is now bearable.  Less anxiety overall.  No AH.    Sleep 8 hour but stays in bed a lot.     Worse if tense and anxious.  Overall doesn't think Neil [name] is working well.  Tongue still does it's thing and mouth stays open a lot.  Tongue moves a lot and worse when stressed.    Pt reports that mood is Not deeply depressed but no sense of well-being..  Anxiety is present and rated as moderate.  Stays in bed a lot in the morning until about 10:30 still bc doesn't want to face the day  Not sad.. Pt reports that appetite is better.  Has tried to regain weight.  Lowest was 120# and normal is 140#. Pt reports that energy is poor but better and loss of interest or pleasure in usual activities, poor motivation and withdrawn from usual activities. Concentration is down slightly. Suicidal thoughts:  denied by patient.  Walks at 2:30 and feels better after that.  No history of suicide attempts.  Not doing much.  Son Neil [name] living with them.  Become more committed [place].  He has schizophrenia.   Past Psychiatric Medication Trials: Mirtazapine,  olanzapine 5 EPS, perphenazine started TD with mouth px,  Saphris 5 mg and stopped due to EPS complaints and feeling "funny" Caplyta side effects risperidone 2 tremors  Buspirone,  amitriptyline 25,  Ingrezza and [name]  Review of Systems:  Review of Systems  Constitutional: Negative for appetite change.  Gastrointestinal: Negative for constipation.  Neurological: Positive for tremors and weakness. Negative for dizziness and syncope.       No falls   Psychiatric/Behavioral: Positive for decreased concentration, depression, dysphoric mood and sleep disturbance. Negative for agitation, behavioral problems, confusion, hallucinations and self-injury. The patient is nervous/anxious.  The patient is not hyperactive.     Medications: I have reviewed the patient's current medications.  Current Outpatient Medications  Medication Sig Dispense Refill  . clonazePAM (KLONOPIN) 0.5 MG tablet Take 1 tablet (0.5 mg total) by mouth 2 (two) times daily. 60 tablet 0  . Deutetrabenazine ([name]) 9 MG TABS Take 18 mg by mouth 2 (two) times daily. (Patient taking differently: Take 9 mg by mouth 2 (two) times daily. ) 120 tablet 0  . docusate sodium (COLACE) 100 MG capsule Take 1 capsule (100 mg total) by mouth every 12 (twelve) hours. 60 capsule 0  . Multiple Vitamin (MULTIVITAMIN WITH MINERALS) TABS tablet Take 1 tablet by mouth daily.    Marland Kitchen  sertraline (ZOLOFT) 50 MG tablet TAKE 1 & 1/2 TABLETS BY MOUTH DAILY (Patient taking differently: 75 mg. ) 45 tablet 2   No current facility-administered medications for this visit.     Medication Side Effects: None  Allergies:  Allergies  Allergen Reactions  . Aspirin Swelling and Other (See Comments)    Reaction:  Tongue swelling   . Sulfur Other (See Comments)    Reaction:  Unknown   . Tetanus Toxoids Swelling and Other (See Comments)    Reaction:  Arm swelling    Past Medical History:  Diagnosis Date  . Anxiety   . Depression     History reviewed. No pertinent family history.  Social History   Socioeconomic History  . Marital status: Married    Spouse name: Not on file  . Number of children: Not on file  . Years of education: Not on file  . Highest education level: Not on file  Occupational History  . Not on file  Social Needs  . Financial resource strain: Not on file  . Food insecurity    Worry: Not on file    Inability: Not on file  . Transportation needs    Medical: Not on file    Non-medical: Not on file  Tobacco Use  . Smoking status: Never Smoker  . Smokeless tobacco: Never Used  Substance and Sexual Activity  . Alcohol use: No  . Drug use: Not on file  . Sexual activity: Not on file  Lifestyle  .  Physical activity    Days per week: Not on file    Minutes per session: Not on file  . Stress: Not on file  Relationships  . Social Herbalist on phone: Not on file    Gets together: Not on file    Attends religious service: Not on file    Active member of club or organization: Not on file    Attends meetings of clubs or organizations: Not on file    Relationship status: Not on file  . Intimate partner violence    Fear of current or ex partner: Not on file    Emotionally abused: Not on file    Physically abused: Not on file    Forced sexual activity: Not on file  Other Topics Concern  . Not on file  Social History Narrative  . Not on file    Past Medical History, Surgical history, Social history, and Family history were reviewed and updated as appropriate.   Please see review of systems for further details on the patient's review from today.   Objective:   Physical Exam:  There were no vitals taken for this visit.  Physical Exam Neurological:     Mental Status: He is alert and oriented to person, place, and time.     Cranial Nerves: No dysarthria.  Psychiatric:        Attention and Perception: Attention normal.        Mood and Affect: Mood is anxious and depressed.        Speech: Speech normal.        Behavior: Behavior is cooperative.        Thought Content: Thought content normal. Thought content is not paranoid or delusional. Thought content does not include homicidal or suicidal ideation. Thought content does not include homicidal or suicidal plan.        Cognition and Memory: Cognition and memory normal.        Judgment: Judgment normal.  Comments: Insight fair.     Lab Review:  No results found for: NA, K, CL, CO2, GLUCOSE, BUN, CREATININE, CALCIUM, PROT, ALBUMIN, AST, ALT, ALKPHOS, BILITOT, GFRNONAA, GFRAA  No results found for: WBC, RBC, HGB, HCT, PLT, MCV, MCH, MCHC, RDW, LYMPHSABS, MONOABS, EOSABS, BASOSABS I will  No results found for:  POCLITH, LITHIUM   No results found for: PHENYTOIN, PHENOBARB, VALPROATE, CBMZ   .res Assessment: Plan:    There are no diagnoses linked to this encounter.   Greater than 50% of 30-minute face to face time with patient was spent on counseling and coordination of care. We discussed Patient has history of severe recurrent major depression with psychotic features.  There are also elements of OCD.  His paranoid and anxious are improved since the last visit.  now feel he denies feeling significantly sad or depressed though he describes a sense of being unwell.Marland Kitchen  His son has schizophrenia so there may be a genetic predisposition to psychosis.  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    Recently he called complaining of side effects from [place] and stopped it as well.  He still will likely need an antipsychotic but is intolerant.  He has benefited with reduction in anxiety from the use of sertraline and clonazepam.  However this is complicated because he is mis. attributing symptoms to medications such as his back pain to medication which is highly unlikely.  Paranoia is better at present but will likely worsen the longer he's off the antipsychotic.  Of note pt has reduced arm swing and psychomotor retardation to some degree but also his son has the same and son's sx predated any antipsychotic.  Confronted patient regarding changing meds on his own.  This has made it very difficult to evaluate benefit and side effects of medications and dosages.  Ask him not to make any further med changes on his own.  He agrees.  For cost reasons and low EPS would like to use loxapine but it may not be available.  It is been a shortage lately.  Another option would be quetiapine with low EPS but is fairly sedating not sure he can tolerate it.  Consider Latuda but it has a fair EPS risk as well.   However it might also help his depression allowing for less sertraline.  We will hold off starting any new antipsychotic while we did address his chief complaint which is the tongue movements.  Again his depression is better with the sertraline but I do not believe it is resolved.  Continue sertraline to 75 daily.  He says he's less depressed.  Disc SE in detail and SSRI withdrawal sx.  Consider increase bc still has anhedonia.  Spending too much time in bed.  This may be contributing to his complaints of back pain.  Discussed the dosing titration schedule with [name].  He was worried that he will get restless or stiff as he increases [name].  Explained to him that previously he may have experienced this but that was while he was on an antipsychotic which contributes to those symptoms and now he is no longer taking an antipsychotic.  No new antipsychotics started today. Increase [name] 12 mg twice daily.  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 Continue sertraline 75 mg daily.  Discussed side effects of each medication.  Discussed potential metabolic side effects associated with atypical antipsychotics, as well as potential risk for movement side effects. Advised pt to contact office if movement side effects occur.   Supportive therapy dealing with the trauma of the prior intrusive sensual psychotic experience noted above. Disc his paranoia and how difficult it is for him to handle.  Needs more time on Capylta.  Rec walk earlier in day to try to help mood.  Greater than 50% of 30-minute face to face time with patient was spent on counseling and coordination of care.   FU 2-3 weeks.  Lynder Parents, MD, DFAPA     No future appointments.  No orders of the defined types were placed in this encounter.     -------------------------------

## 2019-11-21 ENCOUNTER — Telehealth: Payer: Self-pay | Admitting: Psychiatry

## 2019-11-21 NOTE — Telephone Encounter (Signed)
Neil [name] called today stating that Dr. Clovis Pu told him verbally that he should increase his clonazepam to 3/day.  So he will run out early, please send in refill for new dose.  Send to [facility] [address] [place].

## 2019-11-22 ENCOUNTER — Other Ambulatory Visit: Payer: Self-pay | Admitting: Psychiatry

## 2019-11-22 MED ORDER — CLONAZEPAM 0.5 MG PO TABS: 0.5000 mg | ORAL_TABLET | Freq: Three times a day (TID) | ORAL | 0 refills | Status: DC | PRN

## 2019-11-22 NOTE — Telephone Encounter (Signed)
Last note indicated a dosage increase and it was tolerated.  Therefore prescription for clonazepam 0.5 mg 3 times daily #90 was sent to the pharmacy

## 2019-11-22 NOTE — Telephone Encounter (Signed)
Noted thank you

## 2019-11-23 DIAGNOSIS — R69 Illness, unspecified: Secondary | ICD-10-CM | POA: Diagnosis not present

## 2019-11-29 ENCOUNTER — Telehealth: Payer: Self-pay | Admitting: Psychiatry

## 2019-11-29 NOTE — Telephone Encounter (Signed)
Rtc to his wife and let them know an updated Rx was submitted on 12/09 and he needs to [facility]. If not there please call back

## 2019-11-29 NOTE — Telephone Encounter (Signed)
Neil [name] called to request refill of his clonazepam.  He is complete out and needs it sent in asap.  [facility] [address].  Appt 12/21/19.  Please call when this has been done.

## 2019-12-01 ENCOUNTER — Ambulatory Visit: Payer: Medicare HMO | Admitting: Psychiatry

## 2019-12-06 ENCOUNTER — Telehealth: Payer: Self-pay | Admitting: Psychiatry

## 2019-12-06 NOTE — Telephone Encounter (Signed)
If dizziness is not tolerable then reduce clonazepam to 1 in AM and evening and 1/2 tablet mid day. If he feels it's tolerable then don't change the clonazepam.

## 2019-12-06 NOTE — Telephone Encounter (Signed)
Pt called again requesting a return call concerning dizziness that he thinks might be a side effect from his medication.

## 2019-12-06 NOTE — Telephone Encounter (Signed)
Pt made aware

## 2019-12-06 NOTE — Telephone Encounter (Signed)
Pt called requested nurse to return his call @ [phone]. Having med side effects.

## 2019-12-06 NOTE — Telephone Encounter (Signed)
Pt. States that he has been dizzy the last 2 days and is wondering if this is a side effect from the Clonazepam. He stated you added an extra one and has felt this way the last couple of days. I did let him know that it can be a possible side effect and to get his bearings before standing to quickly. Please advise.

## 2019-12-11 ENCOUNTER — Telehealth: Payer: Self-pay | Admitting: Psychiatry

## 2019-12-11 NOTE — Telephone Encounter (Signed)
Pt called to report good news. He says his dizziness is much better now and tolerable. Also the tongue and throat issues are relaxing some. No reason to call back.

## 2019-12-19 ENCOUNTER — Other Ambulatory Visit: Payer: Self-pay | Admitting: Psychiatry

## 2019-12-19 DIAGNOSIS — F411 Generalized anxiety disorder: Secondary | ICD-10-CM

## 2019-12-19 DIAGNOSIS — F333 Major depressive disorder, recurrent, severe with psychotic symptoms: Secondary | ICD-10-CM

## 2019-12-19 NOTE — Telephone Encounter (Signed)
No early refill on the clonazepam

## 2019-12-19 NOTE — Telephone Encounter (Signed)
He filled this on 12/16, but has apt on 12/21/2019

## 2019-12-21 ENCOUNTER — Encounter: Payer: Self-pay | Admitting: Psychiatry

## 2019-12-21 ENCOUNTER — Ambulatory Visit (INDEPENDENT_AMBULATORY_CARE_PROVIDER_SITE_OTHER): Payer: Medicare HMO | Admitting: Psychiatry

## 2019-12-21 DIAGNOSIS — G2401 Drug induced subacute dyskinesia: Secondary | ICD-10-CM | POA: Diagnosis not present

## 2019-12-21 DIAGNOSIS — F333 Major depressive disorder, recurrent, severe with psychotic symptoms: Secondary | ICD-10-CM

## 2019-12-21 DIAGNOSIS — F251 Schizoaffective disorder, depressive type: Secondary | ICD-10-CM

## 2019-12-21 DIAGNOSIS — F061 Catatonic disorder due to known physiological condition: Secondary | ICD-10-CM | POA: Diagnosis not present

## 2019-12-21 DIAGNOSIS — F411 Generalized anxiety disorder: Secondary | ICD-10-CM | POA: Diagnosis not present

## 2019-12-21 DIAGNOSIS — R69 Illness, unspecified: Secondary | ICD-10-CM | POA: Diagnosis not present

## 2019-12-21 MED ORDER — AUSTEDO 12 MG PO TABS: 12.0000 mg | ORAL_TABLET | Freq: Two times a day (BID) | ORAL | 1 refills | Status: DC

## 2019-12-21 NOTE — Progress Notes (Addendum)
Neil HELLBERG [id number] 06/16/1941 79 y.o.   Virtual Visit via [place]  I connected with pt by WebEx and verified that I am speaking with the correct person using two identifiers.   I discussed the limitations, risks, security and privacy concerns of performing an evaluation and management service by [facility] [place] and the availability of in person appointments. I also discussed with the patient that there may be a patient responsible charge related to this service. The patient expressed understanding and agreed to proceed.  I discussed the assessment and treatment plan with the patient. The patient was provided an opportunity to ask questions and all were answered. The patient agreed with the plan and demonstrated an understanding of the instructions.   The patient was advised to call back or seek an in-person evaluation if the symptoms worsen or if the condition fails to improve as anticipated.  I provided 30 minutes of video time during this encounter. The call started at 130 and ended at 2:00. The patient was located at home and the provider was located office.   Subjective:   Patient ID:  Neil [name] is a 79 y.o. (DOB 06-19-1941) male.  Chief Complaint:  Chief Complaint  Patient presents with  . Follow-up    Medication Management  . Depression    Medication Management  . Other    Tardive dyskinesia    Depression        Associated symptoms include decreased concentration.  Associated symptoms include no appetite change.   Neil [name] presents today for follow-up of psychosis, depression, and anxiety.  He has required an urgent appointment because of racing worsening psychotic symptoms and frequent phone calls with med changes since he was last here  When seen September 3 he was switched off olanzapine because of EPS and switched on to Saphris which was raised to 5 mg nightly but only took that dose 1 time.  The rest of the time he took only 2.5 mg nightly.   He called back 5  days later stating he wanted to stop it because it was causing him to hold his mouth open and he was having a worsening time with tongue movements and felt "funny".  He was encouraged to give it more time but he would not agree to do so.  He stopped it Friday 9/11 and still felt the funny tense feeling.    When  seen September 01, 2019.  The decision was made to switch from [place] to [place] because of difficulty getting an adequate response and limited side effects from [place].  We also discussed the antipsychotic and the plan was to start [place] but those samples were not available so he decided to restart Saphris 2.5 mg nightly.  At the visit September 15, 2011 [name] was increased to 12 mg twice daily, Saphris was discontinued and Caplyta 42 mg daily was started because of his low EPS risk.  He still needed an antipsychotic.  He continued on sertraline 75 mg daily because he felt it helped his depression.  Wife called 10/8 and said the following: On Caplyta,  Thinks it is too strong.  His movements are slow, sleeping too much, drowsy during day ( doesn't really wake up until 4pm after sleeping all night). Takes him along time to think and respond. Had trouble with mouth and jaw before, and now he is holding his mouth open all the time.  Paranoia is better though. Was told to take it 3 days weekly.  October 16 [name] increased to 18 mg BID  Called again November 9 stating he couldn't tolerate either med.  He stopped the Caplyta and reduced the [name] to 9 mg BID a few days ago.  CO rigidity in lower back and arms.  Still bothered by constant tongue movements.  No change in worry, mood but paranoia is reduced since Caplyta.   visit October 25, 2019.  He had stopped Caplyta.  He had also reduced [name] to 9 mg twice daily on his own.  He has been making frequent med changes [place] which is made it extremely difficult to get an antipsychotic to deal with his psychotic symptoms  as well as adjust the [name] today with tardive dyskinesia symptoms.  He is very impatient with med changes.  Therefore we did not start a new antipsychotic at the last visit.  He tends to make frequent phone calls and did call again on November 23 complaining of feeling very restless and difficulty sleeping.  Was prescribed clonazepam 0.5 mg twice daily.  He called back the next day stating he wanted to stop the [name] due to restlessness but he had never taken the prescribed clonazepam.  Using courage to follow the treatment plan and take the clonazepam.  He called again the next day stating he still wanted to stop [name] and the clonazepam was making his gait unsteady.  He was informed that if he stopped the [name] his mouth movements would get worse which is been a leading complaint but he could stop it if he wanted to.  December 4 he reports clonazepam has helped the anxiety and less guilt and less rigidity.  Taking TID and only SE is a little slower movements.  1 fall, slipped on Ba floor where water was lying.  No injury.   Gained 3# and better appetite for the first time in a long time.  CO tongue movements.  Restlessness is now bearable.  Less anxiety overall.  No AH.    Last visit December 4.  His psychosis was not severe and the decision was made not to start another antipsychotic but instead to try to optimize treatment for tardive dyskinesia and [name] was increased to 12 mg twice daily. He called back immediately after the head visit admitting he had given false information during the visit and that he not actually been taking the [name] in the way he told me during the visit making it necessary to change the [name] to 9 mg twice daily.  He was confronted about his noncompliance and warned that he may be discharged from the practice if he continued to be misleading and noncompliant with the physician.  This is not only compromising care but it is increasing his own risk because he is  giving false information.  He is called with complaints since last visit about clonazepam wondering if it was causing dizziness.  Was told that could possibly be the case and he could reduce the dose if necessary.  He did reduce clonazepam to 0.5 mg 2 1/2 daily without current dizziness but still some balance issues.  Claims compliant with [name] 9 mg BID until the last 2 days.  Some improvement but still tongue movements with tongue pressing on teeth and some tightness in his neck.  Varies in intensity.    CO balance issues, tired and sleepy often getting enough sleep. Excessive napping.   Not as much worry now.  Still feels shame.  No thoughts of mind-reading.  Denies  paranoia. Wife wants him to be more active and concerned about sleepiness. Clonazepam helps anxiety and worry.  Has not tried reducing it further.    Pt reports that mood is Not deeply depressed but no sense of well-being..  Anxiety is present and rated as moderate.  Stays in bed a lot in the morning until about 10:30 still bc doesn't want to face the day  Not sad.. Pt reports that appetite is better.  Has tried to regain weight.  Lowest was 120# and normal is 140#. Pt reports that energy is poor but better and loss of interest or pleasure in usual activities, poor motivation and withdrawn from usual activities. Concentration is down slightly. Suicidal thoughts:  denied by patient.  Walks at 2:30 and feels better after that.   No history of suicide attempts.  Not doing much.  Son [name] living with them.  Become more committed [place].  He has schizophrenia.   Past Psychiatric Medication Trials: Mirtazapine,  olanzapine 5 EPS, perphenazine started TD with mouth px,  Saphris 5 mg and stopped due to EPS complaints and feeling "funny" Caplyta side effects risperidone 2 tremors  Buspirone,  amitriptyline 25,  Clonazepam 0.5 TID dizzy and sleepy [place] and [name]  Review of Systems:  Review of Systems  Constitutional:  Negative for appetite change.  Gastrointestinal: Negative for constipation.  Neurological: Positive for tremors and weakness. Negative for dizziness and syncope.       No falls But complain of poor balance  Psychiatric/Behavioral: Positive for decreased concentration, depression, dysphoric mood and sleep disturbance. Negative for agitation, behavioral problems, confusion, hallucinations and self-injury. The patient is nervous/anxious. The patient is not hyperactive.     Medications: I have reviewed the patient's current medications.  Current Outpatient Medications  Medication Sig Dispense Refill  . clonazePAM (KLONOPIN) 0.5 MG tablet Take 1 tablet (0.5 mg total) by mouth 3 (three) times daily as needed for anxiety. (Patient taking differently: Take 0.5 mg by mouth. 1 in AM and PM and 1/2 tablet mid-day) 90 tablet 0  . Deutetrabenazine ([name]) 9 MG TABS Take 9 mg by mouth 2 (two) times daily. (Patient taking differently: Take 9 mg by mouth 2 (two) times daily. Last 2 days taking 1 daily bc was running out) 60 tablet 1  . docusate sodium (COLACE) 100 MG capsule Take 1 capsule (100 mg total) by mouth every 12 (twelve) hours. 60 capsule 0  . Multiple Vitamin (MULTIVITAMIN WITH MINERALS) TABS tablet Take 1 tablet by mouth daily.    . sertraline (ZOLOFT) 50 MG tablet TAKE 1 & 1/2 TABLETS BY MOUTH DAILY 45 tablet 2  . Deutetrabenazine ([name]) 12 MG TABS Take 12 mg by mouth 2 (two) times daily. 60 tablet 1   No current facility-administered medications for this visit.    Medication Side Effects: [name]  Allergies:  Allergies  Allergen Reactions  . Aspirin Swelling and Other (See Comments)    Reaction:  Tongue swelling   . Sulfur Other (See Comments)    Reaction:  Unknown   . Tetanus Toxoids Swelling and Other (See Comments)    Reaction:  Arm swelling    Past Medical History:  Diagnosis Date  . Anxiety   . Depression     History reviewed. No pertinent family history.  Social  History   Socioeconomic History  . Marital status: Married    Spouse name: Not on file  . Number of children: Not on file  . Years of education: Not on file  .  Highest education level: Not on file  Occupational History  . Not on file  Tobacco Use  . Smoking status: Never Smoker  . Smokeless tobacco: Never Used  Substance and Sexual Activity  . Alcohol use: No  . Drug use: Not on file  . Sexual activity: Not on file  Other Topics Concern  . Not on file  Social History Narrative  . Not on file   Social Determinants of Health   Financial Resource Strain:   . Difficulty of Paying Living Expenses: Not on file  Food Insecurity:   . Worried About Charity fundraiser in the Last Year: Not on file  . Ran Out of Food in the Last Year: Not on file  Transportation Needs:   . Lack of Transportation (Medical): Not on file  . Lack of Transportation (Non-Medical): Not on file  Physical Activity:   . Days of Exercise per Week: Not on file  . Minutes of Exercise per Session: Not on file  Stress:   . Feeling of Stress : Not on file  Social Connections:   . Frequency of Communication with Friends and Family: Not on file  . Frequency of Social Gatherings with Friends and Family: Not on file  . Attends Religious Services: Not on file  . Active Member of Clubs or Organizations: Not on file  . Attends Archivist Meetings: Not on file  . Marital Status: Not on file  Intimate Partner Violence:   . Fear of Current or Ex-Partner: Not on file  . Emotionally Abused: Not on file  . Physically Abused: Not on file  . Sexually Abused: Not on file    Past Medical History, Surgical history, Social history, and Family history were reviewed and updated as appropriate.   Please see review of systems for further details on the patient's review from today.   Objective:   Physical Exam:  There were no vitals taken for this visit.  Physical Exam Neurological:     Mental Status: He is  alert and oriented to person, place, and time.     Cranial Nerves: No dysarthria.  Psychiatric:        Attention and Perception: He is inattentive.        Mood and Affect: Mood is anxious and depressed.        Speech: Speech normal.        Behavior: Behavior is cooperative.        Thought Content: Thought content normal. Thought content is not paranoid or delusional. Thought content does not include homicidal or suicidal ideation. Thought content does not include homicidal or suicidal plan.        Cognition and Memory: Cognition and memory normal.        Judgment: Judgment normal.     Comments: Insight fair.     Lab Review:  No results found for: NA, K, CL, CO2, GLUCOSE, BUN, CREATININE, CALCIUM, PROT, ALBUMIN, AST, ALT, ALKPHOS, BILITOT, GFRNONAA, GFRAA  No results found for: WBC, RBC, HGB, HCT, PLT, MCV, MCH, MCHC, RDW, LYMPHSABS, MONOABS, EOSABS, BASOSABS I will  No results found for: POCLITH, LITHIUM   No results found for: PHENYTOIN, PHENOBARB, VALPROATE, CBMZ   .res Assessment: Plan:    Vic was seen today for follow-up, depression and other.  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  Generalized anxiety disorder  Severe recurrent major depression with psychotic features, mood-congruent ([place])    Greater than 50% of 30-minute face to face time with patient was spent on counseling and coordination of care. We discussed Patient has history of severe recurrent major depression with psychotic features.  There are also elements of OCD.  The patient has been extremely difficult to treat because of medication sensitivity and frequently changing medications on his own.  This has been discussed with him as compromising his treatment outcome.  He is very impatient with med  trials and frequently calls between appointments wanting medicine changes further compromising treatment outcome.  His paranoid and anxious are improved.  now feel he denies feeling significantly sad or depressed though he describes a sense of being unwell.Marland Kitchen  His son has schizophrenia so there may be a genetic predisposition to psychosis.  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      He has benefited with reduction in anxiety from the use of sertraline and clonazepam.  Again his depression is better with the sertraline but I do not believe it is resolved.  Still dealing with shame but that may be mild paranoia or anxiety and not depression. Continue sertraline to 75 daily.  He says he's less depressed.  Disc SE in detail and SSRI withdrawal sx.  Consider increase bc still has anhedonia.  Paranoia is better at present but will likely worsen the longer he's off the antipsychotic.  Confronted patient regarding changing meds on his own.  This has made it very difficult to evaluate benefit and side effects of medications and dosages.  Ask him not to make any further med changes on his own.  He agrees.  For cost reasons and low EPS would like to use loxapine but it may not be available.  It is been a shortage lately.  Another option would be quetiapine with low EPS but is fairly sedating not sure he can tolerate it.  Consider Latuda but it has a fair EPS risk as well.  However it might also help his depression allowing for less sertraline.  We will hold off starting any new antipsychotic while we did address his chief complaint which is the tongue movements.  Spending too much time in bed.  This may be contributing to his complaints of back pain.  Option switch to lorazepam bc usually less SE vis clonazepam but he prefers to reduce clonazepam first to 0.25 mg QID.  This is because he is  excessively sleepy and complaining of some balance issues  Discussed the dosing titration schedule with [name].  He was worried that he will get restless or stiff as he increases [name].  Explained to him that previously he may have experienced this but that was while he was on an antipsychotic which contributes to those symptoms and now he is no longer taking an antipsychotic.  No new antipsychotics started today. Increase [name] 12 mg twice daily.  The tardive dyskinesia symptoms will not improve until that dose is increased further however we are attempting to minimize "rigidity" .  Continue sertraline 75 mg daily for anxiety.  Supportive therapy dealing with the trauma of the prior intrusive sensual psychotic experience noted above. Disc his paranoia and how difficult it is for him to handle.  Needs more time on Capylta.  Rec walk earlier in  day to try to help mood.  Greater than 50% of 30-minute face to face time with patient was spent on counseling and coordination of care.   FU next available appointment which is likely to be in 4 to 6 weeks.  We will try to get him on a 3-week schedule rotation because of frequent phone calls and concerns between appointments.  Lynder Parents, MD, DFAPA     No future appointments.  No orders of the defined types were placed in this encounter.     -------------------------------

## 2019-12-26 ENCOUNTER — Telehealth: Payer: Self-pay | Admitting: Psychiatry

## 2019-12-26 NOTE — Telephone Encounter (Signed)
Patient called and said that he is dizzy on the clonazapam and he would like to try ativan. He remembers talking about that medicine with you that it has less side effects

## 2020-01-03 NOTE — Telephone Encounter (Signed)
Patient has apt 01/21

## 2020-01-04 ENCOUNTER — Ambulatory Visit (INDEPENDENT_AMBULATORY_CARE_PROVIDER_SITE_OTHER): Payer: Medicare HMO | Admitting: Psychiatry

## 2020-01-04 ENCOUNTER — Encounter: Payer: Self-pay | Admitting: Psychiatry

## 2020-01-04 DIAGNOSIS — G2401 Drug induced subacute dyskinesia: Secondary | ICD-10-CM

## 2020-01-04 DIAGNOSIS — F411 Generalized anxiety disorder: Secondary | ICD-10-CM | POA: Diagnosis not present

## 2020-01-04 DIAGNOSIS — R69 Illness, unspecified: Secondary | ICD-10-CM | POA: Diagnosis not present

## 2020-01-04 DIAGNOSIS — F251 Schizoaffective disorder, depressive type: Secondary | ICD-10-CM

## 2020-01-04 DIAGNOSIS — F061 Catatonic disorder due to known physiological condition: Secondary | ICD-10-CM

## 2020-01-04 MED ORDER — LORAZEPAM 0.5 MG PO TABS: 0.5000 mg | ORAL_TABLET | Freq: Four times a day (QID) | ORAL | 1 refills | Status: DC

## 2020-01-04 NOTE — Progress Notes (Signed)
KOHNER KOWALSKY [id number] 12/21/1940 79 y.o.   Virtual Visit via [place]  I connected with pt by WebEx and verified that I am speaking with the correct person using two identifiers.   I discussed the limitations, risks, security and privacy concerns of performing an evaluation and management service by [facility] [place] and the availability of in person appointments. I also discussed with the patient that there may be a patient responsible charge related to this service. The patient expressed understanding and agreed to proceed.  I discussed the assessment and treatment plan with the patient. The patient was provided an opportunity to ask questions and all were answered. The patient agreed with the plan and demonstrated an understanding of the instructions.   The patient was advised to call back or seek an in-person evaluation if the symptoms worsen or if the condition fails to improve as anticipated.  I provided 30 minutes of video time during this encounter. The call started at 1030 and ended at 11:00. The patient was located at home and the provider was located office.   Subjective:   Patient ID:  Neil [name] is a 79 y.o. (DOB [birth year]) male.  Chief Complaint:  Chief Complaint  Patient presents with  . Follow-up    Medication Management  . 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 Medication Problem  . Anxiety    Depression        Associated symptoms include decreased concentration.  Associated symptoms include no appetite change.   DENNON MOYNAHAN presents today for follow-up of psychosis, depression, and anxiety.  He has required an urgent appointment because of wanting further med changes and frequent phone calls with med changes since he was last here  When seen September 3 he was switched off olanzapine because of EPS and switched on to Saphris which was raised to 5 mg nightly but only took that dose 1 time.  The  rest of the time he took only 2.5 mg nightly.   He called back 5 days later stating he wanted to stop it because it was causing him to hold his mouth open and he was having a worsening time with tongue movements and felt "funny".  He was encouraged to give it more time but he would not agree to do so.  He stopped it Friday 9/11 and still felt the funny tense feeling.    When  seen September 01, 2019.  The decision was made to switch from [place] to [place] because of difficulty getting an adequate response and limited side effects from [place].  We also discussed the antipsychotic and the plan was to start [place] but those samples were not available so he decided to restart Saphris 2.5 mg nightly.  At the visit September 15, 2011 [name] was increased to 12 mg twice daily, Saphris was discontinued and Caplyta 42 mg daily was started because of his low EPS risk.  He still needed an antipsychotic.  He continued on sertraline 75 mg daily because he felt it helped his depression.  Wife called 10/8 and said the following: On Caplyta,  Thinks it is too strong.  His movements are slow, sleeping too much, drowsy during day ( doesn't really wake up until 4pm after sleeping all night). Takes him along time to think and respond. Had trouble with mouth and jaw before, and now he is holding his mouth open all the time.  Paranoia is better though. Was  told to take it 3 days weekly.    October 16 [name] increased to 18 mg BID  Called again November 9 stating he couldn't tolerate either med.  He stopped the Caplyta and reduced the [name] to 9 mg BID a few days ago.  CO rigidity in lower back and arms.  Still bothered by constant tongue movements.  No change in worry, mood but paranoia is reduced since Caplyta.   visit October 25, 2019.  He had stopped Caplyta.  He had also reduced [name] to 9 mg twice daily on his own.  He has been making frequent med changes [place] which is made it extremely  difficult to get an antipsychotic to deal with his psychotic symptoms as well as adjust the [name] today with tardive dyskinesia symptoms.  He is very impatient with med changes.  Therefore we did not start a new antipsychotic at the last visit.  He tends to make frequent phone calls and did call again on November 23 complaining of feeling very restless and difficulty sleeping.  Was prescribed clonazepam 0.5 mg twice daily.  He called back the next day stating he wanted to stop the [name] due to restlessness but he had never taken the prescribed clonazepam.  Using courage to follow the treatment plan and take the clonazepam.  He called again the next day stating he still wanted to stop [name] and the clonazepam was making his gait unsteady.  He was informed that if he stopped the [name] his mouth movements would get worse which is been a leading complaint but he could stop it if he wanted to.  December 4 he reports clonazepam has helped the anxiety and less guilt and less rigidity.  Taking TID and only SE is a little slower movements.  1 fall, slipped on Ba floor where water was lying.  No injury.   Gained 3# and better appetite for the first time in a long time.  CO tongue movements.  Restlessness is now bearable.  Less anxiety overall.  No AH.    visit December 4.  His psychosis was not severe and the decision was made not to start another antipsychotic but instead to try to optimize treatment for tardive dyskinesia and [name] was increased to 12 mg twice daily. He called back immediately after the head visit admitting he had given false information during the visit and that he not actually been taking the [name] in the way he told me during the visit making it necessary to change the [name] to 9 mg twice daily.  He was confronted about his noncompliance and warned that he may be discharged from the practice if he continued to be misleading and noncompliant with the physician.  This is not only  compromising care but it is increasing his own risk because he is giving false information.   Last visit December 21, 2019.  Because of his complaints about balance issues clonazepam was reduced and spread out to 0.25 mg 4 times daily.  Also for tardive dyskinesia symptoms [name] was increased to 12 mg twice daily.  Prior to that visit he admitted being generally noncompliant with the [name] making it difficult to judge what dose to prescribe.  He was continued on sertraline 75 mg daily as he felt that was helpful for his anxiety.  Says he's compliant with med changes last visit.  Tongue movements are better but not gone. No problems with the [name].  Stiffness comes and goes and located in head  and eyes.  Denies it's vertigo.  Intensity is less.  If shuts his eyes it goes right away.  Anxiety is manageable.  Still has balance issues but less dizziness.  Had fall this week while working on something.  Wants to switch clonazepam to Ativan to try to help balance.  At times fearful thought but not a lot and able to handle them.  Gained 5# and appetite is better and up to 130#.  Feels better physically.  Pt reports that mood is Not deeply depressed but no sense of well-being..  Anxiety is present and rated as moderate.  Still Stays in bed a lot in the morning until about 10:30 still bc doesn't want to face the day  Tired.  Not sad.. Pt reports that appetite is better.  Has tried to regain weight.  Lowest was 120# and normal is 140#. Pt reports that energy is poor but better and loss of interest or pleasure in usual activities, poor motivation and withdrawn from usual activities. Concentration is down slightly. Suicidal thoughts:  denied by patient.  Walks at 2:30 and feels better after that.   No history of suicide attempts.  Son [name] living with them.  Become more committed [place].  He has schizophrenia.   Past Psychiatric Medication Trials: Mirtazapine,  olanzapine 5 EPS, perphenazine started TD  with mouth px,  Saphris 5 mg and stopped due to EPS complaints and feeling "funny" Caplyta side effects risperidone 2 tremors  Buspirone,  amitriptyline 25,  Clonazepam 0.5 TID dizzy and sleepy [place] and [name]  Review of Systems:  Review of Systems  Constitutional: Negative for appetite change.  Gastrointestinal: Negative for constipation.  Neurological: Positive for tremors and weakness. Negative for dizziness and syncope.       No falls But complain of poor balance  Psychiatric/Behavioral: Positive for decreased concentration, depression, dysphoric mood and sleep disturbance. Negative for agitation, behavioral problems, confusion, hallucinations and self-injury. The patient is nervous/anxious. The patient is not hyperactive.     Medications: I have reviewed the patient's current medications.  Current Outpatient Medications  Medication Sig Dispense Refill  . Deutetrabenazine ([name]) 12 MG TABS Take 12 mg by mouth 2 (two) times daily. 60 tablet 1  . docusate sodium (COLACE) 100 MG capsule Take 1 capsule (100 mg total) by mouth every 12 (twelve) hours. 60 capsule 0  . Multiple Vitamin (MULTIVITAMIN WITH MINERALS) TABS tablet Take 1 tablet by mouth daily.    . sertraline (ZOLOFT) 50 MG tablet TAKE 1 & 1/2 TABLETS BY MOUTH DAILY 45 tablet 2  . LORazepam (ATIVAN) 0.5 MG tablet Take 1 tablet (0.5 mg total) by mouth 4 (four) times daily. 120 tablet 1   No current facility-administered medications for this visit.    Medication Side Effects: None  Allergies:  Allergies  Allergen Reactions  . Aspirin Swelling and Other (See Comments)    Reaction:  Tongue swelling   . Sulfur Other (See Comments)    Reaction:  Unknown   . Tetanus Toxoids Swelling and Other (See Comments)    Reaction:  Arm swelling    Past Medical History:  Diagnosis Date  . Anxiety   . Depression     History reviewed. No pertinent family history.  Social History   Socioeconomic History  . Marital  status: Married    Spouse name: Not on file  . Number of children: Not on file  . Years of education: Not on file  . Highest education level: Not on file  Occupational History  . Not on file  Tobacco Use  . Smoking status: Never Smoker  . Smokeless tobacco: Never Used  Substance and Sexual Activity  . Alcohol use: No  . Drug use: Not on file  . Sexual activity: Not on file  Other Topics Concern  . Not on file  Social History Narrative  . Not on file   Social Determinants of Health   Financial Resource Strain:   . Difficulty of Paying Living Expenses: Not on file  Food Insecurity:   . Worried About Charity fundraiser in the Last Year: Not on file  . Ran Out of Food in the Last Year: Not on file  Transportation Needs:   . Lack of Transportation (Medical): Not on file  . Lack of Transportation (Non-Medical): Not on file  Physical Activity:   . Days of Exercise per Week: Not on file  . Minutes of Exercise per Session: Not on file  Stress:   . Feeling of Stress : Not on file  Social Connections:   . Frequency of Communication with Friends and Family: Not on file  . Frequency of Social Gatherings with Friends and Family: Not on file  . Attends Religious Services: Not on file  . Active Member of Clubs or Organizations: Not on file  . Attends Archivist Meetings: Not on file  . Marital Status: Not on file  Intimate Partner Violence:   . Fear of Current or Ex-Partner: Not on file  . Emotionally Abused: Not on file  . Physically Abused: Not on file  . Sexually Abused: Not on file    Past Medical History, Surgical history, Social history, and Family history were reviewed and updated as appropriate.   Please see review of systems for further details on the patient's review from today.   Objective:   Physical Exam:  There were no vitals taken for this visit.  Physical Exam Neurological:     Mental Status: He is alert and oriented to person, place, and time.      Cranial Nerves: No dysarthria.  Psychiatric:        Attention and Perception: He is inattentive.        Mood and Affect: Mood is anxious and depressed.        Speech: Speech normal.        Behavior: Behavior is cooperative.        Thought Content: Thought content normal. Thought content is not paranoid or delusional. Thought content does not include homicidal or suicidal ideation. Thought content does not include homicidal or suicidal plan.        Cognition and Memory: Cognition and memory normal.        Judgment: Judgment normal.     Comments: Insight fair.     Lab Review:  No results found for: NA, K, CL, CO2, GLUCOSE, BUN, CREATININE, CALCIUM, PROT, ALBUMIN, AST, ALT, ALKPHOS, BILITOT, GFRNONAA, GFRAA  No results found for: WBC, RBC, HGB, HCT, PLT, MCV, MCH, MCHC, RDW, LYMPHSABS, MONOABS, EOSABS, BASOSABS I will  No results found for: POCLITH, LITHIUM   No results found for: PHENYTOIN, PHENOBARB, VALPROATE, CBMZ   .res Assessment: Plan:    Mian was seen today for follow-up, other, medication problem and anxiety.  Diagnoses and all orders for this visit:  Generalized anxiety disorder -     LORazepam (ATIVAN) 0.5 MG tablet; Take 1 tablet (0.5 mg total) by mouth 4 (four) times daily.  Schizoaffective disorder, depressive type without good  prognostic features and with catatonia ([place])  Tardive dyskinesia    Greater than 50% of 30-minute face to face time with patient was spent on counseling and coordination of care. We discussed Patient has history of severe recurrent major depression with psychotic features.  There are also elements of OCD.  The patient has been extremely difficult to treat because of medication sensitivity and frequently changing medications on his own.    This has been discussed with him as compromising his treatment outcome.  He is very impatient with med trials and frequently calls between appointments wanting medicine changes further compromising treatment  outcome.  This is continued even since his last appointment.  His paranoid and anxious are improved.  now feel he denies feeling significantly sad or depressed though he describes a sense of being unwell.Marland Kitchen  His son has schizophrenia so there may be a genetic predisposition to psychosis.  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      He has benefited with reduction in anxiety from the use of sertraline and clonazepam.  Again his depression is better with the sertraline but I do not believe it is resolved.  Still dealing with shame but that may be mild paranoia or anxiety and not depression. Continue sertraline to 75 daily.  He says he's less depressed and it has helped his anxiety..  Disc SE in detail and SSRI withdrawal sx.  Consider increase bc still has anhedonia.  Paranoia is better at present but will likely worsen the longer he's off the antipsychotic.  Confronted patient regarding changing meds on his own.  This has made it very difficult to evaluate benefit and side effects of medications and dosages.  Ask him not to make any further med changes on his own.  He agrees.  He has done better with this since the last appointment reportedly.  For cost reasons and low EPS would like to use loxapine but it may not be available.  It is been a shortage lately.  Another option would be quetiapine with low EPS but is fairly sedating not sure he can tolerate it.  Consider Latuda but it has a fair EPS risk as well.  However it might also help his depression allowing for less sertraline.  We will hold off starting any new antipsychotic as long as possible.    Spending too much time in bed.  This may be contributing to his complaints of back pain.  Option switch to lorazepam bc tiredness and balance problems to Ativan. Yes DC clonazepam and start lorazepam 0.5 mg 4 times daily  Discussed  the dosing titration schedule with [name].  He was worried that he will get restless or stiff as he increases [name].  Explained to him that previously he may have experienced this but that was while he was on an antipsychotic which contributes to those symptoms and now he is no longer taking an antipsychotic.  The symptoms here describing of "stiffness" are not typical parkinsonian symptoms.  Of note the patient's son is a schizophrenic who had marked slowness predating any use of antipsychotic.  This patient's baseline prior to antipsychotic use is unknown.  No new antipsychotics started today. Continue [name] 12 mg twice daily.  The tardive dyskinesia symptoms will not improve until that dose is increased further however we are attempting to minimize "rigidity" .  His tongue movements are improved with the increase  [name].  Continue sertraline 75 mg daily for anxiety. He feels this is helpful.  Rec walk earlier in day to try to help mood.  Greater than 50% of 30-minute face to face time with patient was spent on counseling and coordination of care.   FU next available appointment which is likely to be in 4 to 6 weeks.  We will try to get him on a 3-week schedule rotation because of frequent phone calls and concerns between appointments.  Lynder Parents, MD, DFAPA     Future Appointments  Date Time Provider [place]  01/08/2020  1:15 PM [place] [facility]  01/22/2020 11:30 AM [place], [place]ley [place]., MD CP-CP None    No orders of the defined types were placed in this encounter.     -------------------------------

## 2020-01-05 ENCOUNTER — Ambulatory Visit: Payer: Medicare HMO

## 2020-01-08 ENCOUNTER — Ambulatory Visit: Payer: Medicare HMO | Attending: Internal Medicine

## 2020-01-08 DIAGNOSIS — Z23 Encounter for immunization: Secondary | ICD-10-CM

## 2020-01-08 NOTE — Progress Notes (Signed)
   Covid-19 Vaccination Clinic  Name:  Neil [name]    MRN: [id number] DOB: 1941/04/08  01/08/2020  Neil [name] was observed post Covid-19 immunization for 15 minutes without incidence. He was provided with Vaccine Information Sheet and instruction to access the V-Safe system.   Neil [name] was instructed to call 911 with any severe reactions post vaccine: Marland Kitchen Difficulty breathing  . Swelling of your face and throat  . A fast heartbeat  . A bad rash all over your body  . Dizziness and weakness    Immunizations Administered    Name Date Dose VIS Date Route   Pfizer COVID-19 Vaccine 01/08/2020 12:56 PM 0.3 mL 11/24/2019 Intramuscular   Manufacturer: [place]   Lot: [id number]   [place]: [id number]

## 2020-01-22 ENCOUNTER — Ambulatory Visit (INDEPENDENT_AMBULATORY_CARE_PROVIDER_SITE_OTHER): Payer: Medicare HMO | Admitting: Psychiatry

## 2020-01-22 ENCOUNTER — Encounter: Payer: Self-pay | Admitting: Psychiatry

## 2020-01-22 DIAGNOSIS — F411 Generalized anxiety disorder: Secondary | ICD-10-CM | POA: Diagnosis not present

## 2020-01-22 DIAGNOSIS — F061 Catatonic disorder due to known physiological condition: Secondary | ICD-10-CM

## 2020-01-22 DIAGNOSIS — F251 Schizoaffective disorder, depressive type: Secondary | ICD-10-CM

## 2020-01-22 DIAGNOSIS — F333 Major depressive disorder, recurrent, severe with psychotic symptoms: Secondary | ICD-10-CM | POA: Diagnosis not present

## 2020-01-22 DIAGNOSIS — R69 Illness, unspecified: Secondary | ICD-10-CM | POA: Diagnosis not present

## 2020-01-22 DIAGNOSIS — G2401 Drug induced subacute dyskinesia: Secondary | ICD-10-CM

## 2020-01-22 NOTE — Progress Notes (Signed)
Neil [name] [id number] [birth year] 79 y.o.   Virtual Visit via [place]  I connected with pt by WebEx and verified that I am speaking with the correct person using two identifiers.   I discussed the limitations, risks, security and privacy concerns of performing an evaluation and management service by Neil [name] and the availability of in person appointments. I also discussed with the patient that there may be a patient responsible charge related to this service. The patient expressed understanding and agreed to proceed.  I discussed the assessment and treatment plan with the patient. The patient was provided an opportunity to ask questions and all were answered. The patient agreed with the plan and demonstrated an understanding of the instructions.   The patient was advised to call back or seek an in-person evaluation if the symptoms worsen or if the condition fails to improve as anticipated.  I provided 30 minutes of video time during this encounter. The call started at 1130 and ended at 12:00. The patient was located at home and the provider was located office.   Subjective:   Patient ID:  Neil [name] is a 79 y.o. (DOB 03-May-1941) male.  Chief Complaint:  Chief Complaint  Patient presents with  . Follow-up    Medication Management  . Anxiety    Medication Management    Depression        Associated symptoms include decreased concentration.  Associated symptoms include no appetite change.  Past medical history includes anxiety.   Anxiety Symptoms include decreased concentration and nervous/anxious behavior. Patient reports no confusion or dizziness.      Neil [name] presents today for follow-up of psychosis, depression, and anxiety.  He has required an urgent appointment because of wanting further med changes and frequent phone calls with med changes since he was last here  When seen September 3 he was switched off olanzapine because of EPS and switched on to Saphris which  was raised to 5 mg nightly but only took that dose 1 time.  The rest of the time he took only 2.5 mg nightly.   He called back 5 days later stating he wanted to stop it because it was causing him to hold his mouth open and he was having a worsening time with tongue movements and felt "funny".  He was encouraged to give it more time but he would not agree to do so.  He stopped it Friday 9/11 and still felt the funny tense feeling.    When  seen September 01, 2019.  The decision was made to switch from [place] to [place] because of difficulty getting an adequate response and limited side effects from [place].  We also discussed the antipsychotic and the plan was to start [place] but those samples were not available so he decided to restart Saphris 2.5 mg nightly.  At the visit September 15, 2011 [name] was increased to 12 mg twice daily, Saphris was discontinued and Caplyta 42 mg daily was started because of his low EPS risk.  He still needed an antipsychotic.  He continued on sertraline 75 mg daily because he felt it helped his depression.  Wife called 10/8 and said the following: On Caplyta,  Thinks it is too strong.  His movements are slow, sleeping too much, drowsy during day ( doesn't really wake up until 4pm after sleeping all night). Takes him along time to think and respond. Had trouble with mouth and jaw before, and now he is holding his mouth open all  the time.  Paranoia is better though. Was told to take it 3 days weekly.    October 16 [name] increased to 18 mg BID  Called again November 9 stating he couldn't tolerate either med.  He stopped the Caplyta and reduced the [name] to 9 mg BID a few days ago.  CO rigidity in lower back and arms.  Still bothered by constant tongue movements.  No change in worry, mood but paranoia is reduced since Caplyta.   visit October 25, 2019.  He had stopped Caplyta.  He had also reduced [name] to 9 mg twice daily on his own.  He has been making frequent med  changes [place] which is made it extremely difficult to get an antipsychotic to deal with his psychotic symptoms as well as adjust the [name] today with tardive dyskinesia symptoms.  He is very impatient with med changes.  Therefore we did not start a new antipsychotic at the last visit.  He tends to make frequent phone calls and did call again on November 23 complaining of feeling very restless and difficulty sleeping.  Was prescribed clonazepam 0.5 mg twice daily.  He called back the next day stating he wanted to stop the [name] due to restlessness but he had never taken the prescribed clonazepam.  Using courage to follow the treatment plan and take the clonazepam.  He called again the next day stating he still wanted to stop [name] and the clonazepam was making his gait unsteady.  He was informed that if he stopped the [name] his mouth movements would get worse which is been a leading complaint but he could stop it if he wanted to.  December 4 he reports clonazepam has helped the anxiety and less guilt and less rigidity.  Taking TID and only SE is a little slower movements.  1 fall, slipped on Ba floor where water was lying.  No injury.   Gained 3# and better appetite for the first time in a long time.  CO tongue movements.  Restlessness is now bearable.  Less anxiety overall.  No AH.    visit December 4.  His psychosis was not severe and the decision was made not to start another antipsychotic but instead to try to optimize treatment for tardive dyskinesia and [name] was increased to 12 mg twice daily. He called back immediately after the head visit admitting he had given false information during the visit and that he not actually been taking the [name] in the way he told me during the visit making it necessary to change the [name] to 9 mg twice daily.  He was confronted about his noncompliance and warned that he may be discharged from the practice if he continued to be misleading  and noncompliant with the physician.  This is not only compromising care but it is increasing his own risk because he is giving false information.   visit December 21, 2019.  Because of his complaints about balance issues clonazepam was reduced and spread out to 0.25 mg 4 times daily.  Also for tardive dyskinesia symptoms [name] was increased to 12 mg twice daily.  Prior to that visit he admitted being generally noncompliant with the [name] making it difficult to judge what dose to prescribe.  He was continued on sertraline 75 mg daily as he felt that was helpful for his anxiety. Says he's compliant with med changes last visit.  Tongue movements are better but not gone. No problems with the [name].  Stiffness comes  and goes and located in head and eyes.  Denies it's vertigo.  Intensity is less.  If shuts his eyes it goes right away.  Last visit January 04, 2020.  [name] remained 12 mg twice daily and sertraline 75 mg daily.  The following was changed: switch to lorazepam bc tiredness and balance problems to Ativan. Yes DC clonazepam and start lorazepam 0.5 mg 4 times daily  Today February 8 the patient reports the following.  Yes it did help.  Not as slowed and balance is good now.  Still feels tired with tendency to want to lay down and knows he needs to work on this.  Anxiety is pretty fair.  Still feels guilt at times but wife says it's false.  But not as overpowering as it used to be. Likes the Ativan.  Goes to bed early.  Plenty of sleep.  Movement problem is better with [name] but not gone.  Tongue movements were real pronounced but gradually better.    Eating well.   Pt reports that mood is not markedly depressed but down some over his avoidance. .  Anxiety is present and rated as moderate.   Tired.  Not sad.. Pt reports that appetite is better.  Has tried to regain weight.  Lowest was 120# and normal is 140#. Weight now 130#.   Pt reports that energy is poor but better and loss of interest or  pleasure in usual activities.  Occ motivation around the house.  Covid is hurting usual activities. Concentration is down slightly. Suicidal thoughts:  denied by patient.  Not walking outside.    No history of suicide attempts.  Son Neil [name] living with them.  Become more committed [place].  He has schizophrenia.   Past Psychiatric Medication Trials: Mirtazapine,  olanzapine 5 EPS, perphenazine started TD with mouth px,  Saphris 5 mg and stopped due to EPS complaints and feeling "funny" Caplyta side effects risperidone 2 tremors  Buspirone,  amitriptyline 25,  Clonazepam 0.5 TID dizzy and sleepy Ativan 0.5 mg QID [place] and  [name] 12 mg BID  Review of Systems:  Review of Systems  Constitutional: Negative for appetite change.  Gastrointestinal: Negative for constipation.  Neurological: Positive for tremors and weakness. Negative for dizziness and syncope.       No falls But complain of poor balance  Psychiatric/Behavioral: Positive for decreased concentration, depression and dysphoric mood. Negative for agitation, behavioral problems, confusion, hallucinations, self-injury and sleep disturbance. The patient is nervous/anxious. The patient is not hyperactive.     Medications: I have reviewed the patient's current medications.  Current Outpatient Medications  Medication Sig Dispense Refill  . Deutetrabenazine ([name]) 12 MG TABS Take 12 mg by mouth 2 (two) times daily. 60 tablet 1  . diphenhydrAMINE HCl (BENADRYL ALLERGY PO) Take by mouth.    . docusate sodium (COLACE) 100 MG capsule Take 1 capsule (100 mg total) by mouth every 12 (twelve) hours. 60 capsule 0  . LORazepam (ATIVAN) 0.5 MG tablet Take 1 tablet (0.5 mg total) by mouth 4 (four) times daily. 120 tablet 1  . Melatonin-Pyridoxine (MELATIN PO) Take by mouth.    . Multiple Vitamin (MULTIVITAMIN WITH MINERALS) TABS tablet Take 1 tablet by mouth daily.    . sertraline (ZOLOFT) 50 MG tablet TAKE 1 & 1/2 TABLETS BY MOUTH  DAILY 45 tablet 2   No current facility-administered medications for this visit.    Medication Side Effects: None  Allergies:  Allergies  Allergen Reactions  . Aspirin Swelling and  Other (See Comments)    Reaction:  Tongue swelling   . Sulfur Other (See Comments)    Reaction:  Unknown   . Tetanus Toxoids Swelling and Other (See Comments)    Reaction:  Arm swelling    Past Medical History:  Diagnosis Date  . Anxiety   . Depression     History reviewed. No pertinent family history.  Social History   Socioeconomic History  . Marital status: Married    Spouse name: Not on file  . Number of children: Not on file  . Years of education: Not on file  . Highest education level: Not on file  Occupational History  . Not on file  Tobacco Use  . Smoking status: Never Smoker  . Smokeless tobacco: Never Used  Substance and Sexual Activity  . Alcohol use: No  . Drug use: Not on file  . Sexual activity: Not on file  Other Topics Concern  . Not on file  Social History Narrative  . Not on file   Social Determinants of Health   Financial Resource Strain:   . Difficulty of Paying Living Expenses: Not on file  Food Insecurity:   . Worried About Charity fundraiser in the Last Year: Not on file  . Ran Out of Food in the Last Year: Not on file  Transportation Needs:   . Lack of Transportation (Medical): Not on file  . Lack of Transportation (Non-Medical): Not on file  Physical Activity:   . Days of Exercise per Week: Not on file  . Minutes of Exercise per Session: Not on file  Stress:   . Feeling of Stress : Not on file  Social Connections:   . Frequency of Communication with Friends and Family: Not on file  . Frequency of Social Gatherings with Friends and Family: Not on file  . Attends Religious Services: Not on file  . Active Member of Clubs or Organizations: Not on file  . Attends Archivist Meetings: Not on file  . Marital Status: Not on file  Intimate  Partner Violence:   . Fear of Current or Ex-Partner: Not on file  . Emotionally Abused: Not on file  . Physically Abused: Not on file  . Sexually Abused: Not on file    Past Medical History, Surgical history, Social history, and Family history were reviewed and updated as appropriate.   Please see review of systems for further details on the patient's review from today.   Objective:   Physical Exam:  There were no vitals taken for this visit.  Physical Exam Neurological:     Mental Status: He is alert and oriented to person, place, and time.     Cranial Nerves: No dysarthria.  Psychiatric:        Attention and Perception: He is attentive.        Mood and Affect: Mood is anxious. Mood is not depressed.        Speech: Speech normal.        Behavior: Behavior is cooperative.        Thought Content: Thought content normal. Thought content is not paranoid or delusional. Thought content does not include homicidal or suicidal ideation. Thought content does not include homicidal or suicidal plan.        Cognition and Memory: Cognition and memory normal.        Judgment: Judgment normal.     Comments: Insight fair.     Lab Review:  No results found for:  NA, K, CL, CO2, GLUCOSE, BUN, CREATININE, CALCIUM, PROT, ALBUMIN, AST, ALT, ALKPHOS, BILITOT, GFRNONAA, GFRAA  No results found for: WBC, RBC, HGB, HCT, PLT, MCV, MCH, MCHC, RDW, LYMPHSABS, MONOABS, EOSABS, BASOSABS I will  No results found for: POCLITH, LITHIUM   No results found for: PHENYTOIN, PHENOBARB, VALPROATE, CBMZ   .res Assessment: Plan:    Neil [name] was seen today for follow-up and anxiety.  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 We discussed Patient has history of severe recurrent major depression with psychotic features.  There are also elements of OCD.  The patient has been extremely difficult to treat because of medication sensitivity and frequently changing medications on his own.    This has been discussed with him as compromising his treatment outcome.  He is very impatient with med trials and frequently calls between appointments wanting medicine changes further compromising treatment outcome.  This is continued even since his last appointment.  His paranoid and anxious are improved.  now feel he denies feeling significantly sad or depressed though he describes a sense of being unwell with a little guilt.Marland Kitchen    His son has schizophrenia so there may be a genetic predisposition to psychosis.  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        He has benefited with reduction in anxiety from the use of sertraline and benzo.  Again his depression is better with the sertraline.  Still dealing with shame but that may be mild paranoia or anxiety and not depression. Continue sertraline to 75 daily.  He says he's less depressed and it has helped his anxiety. Consider increase but prefer one med change at a time.  Disc SE in detail and SSRI withdrawal sx.  Consider increase bc still has anhedonia.  Paranoia is better at present but will likely worsen the longer he's off the antipsychotic.  Confronted patient regarding changing meds on his own.  This has made it very difficult to evaluate benefit and side effects of medications and dosages.  Ask him not to make any further med changes on his own.  He agrees.  He has done better with this over the last couple of appointments.  For cost reasons and low EPS would like to use loxapine but it may not be  available.  It is been a shortage lately.  Another option would be quetiapine with low EPS but is fairly sedating not sure he can tolerate it.  Consider Latuda but it has a fair EPS risk as well.  However it might also help his depression allowing for less sertraline.  We will hold off starting any new antipsychotic as long as possible.    Spending too much time in bed.  This may be contributing to his complaints of back pain.  Reduce Ativan 0.5 mg TID to see if energy is better.  Discussed the dosing titration schedule with [name].  He was worried that he will get restless or stiff as he increases [name].  Explained to him that previously he may have experienced this but that was while he was on an  antipsychotic which contributes to those symptoms and now he is no longer taking an antipsychotic.  The symptoms here describing of "stiffness" are not typical parkinsonian symptoms.  Of note the patient's son is a schizophrenic who had marked slowness predating any use of antipsychotic.  This patient's baseline prior to antipsychotic use is unknown.  No new antipsychotics started today. Continue [name] 12 mg twice daily.  The tardive dyskinesia symptoms will not improve until that dose is increased further however we are attempting to minimize "rigidity" .  His tongue movements are improved with the increase [name].  Continue sertraline 75 mg daily for anxiety. He feels this is helpful.  Rec walk outside again and try to increase activity in order to improve mood and his sense of confidence and normalcy.  FU 6 weeks  Lynder Parents, MD, DFAPA     Future Appointments  Date Time Provider [employer]  01/29/2020  1:15 PM [facility]    No orders of the defined types were placed in this encounter.     -------------------------------

## 2020-01-29 ENCOUNTER — Ambulatory Visit: Payer: Medicare HMO | Attending: Internal Medicine

## 2020-01-29 DIAGNOSIS — Z23 Encounter for immunization: Secondary | ICD-10-CM | POA: Insufficient documentation

## 2020-01-29 NOTE — Progress Notes (Signed)
   Covid-19 Vaccination Clinic  Name:  Neil [name]    MRN: [id number] DOB: 07/10/1941  01/29/2020  [name] was observed post Covid-19 immunization for 30 minutes based on pre-vaccination screening without incidence. He was provided with Vaccine Information Sheet and instruction to access the V-Safe system.   [name] was instructed to call 911 with any severe reactions post vaccine: Marland Kitchen Difficulty breathing  . Swelling of your face and throat  . A fast heartbeat  . A bad rash all over your body  . Dizziness and weakness    Immunizations Administered    Name Date Dose VIS Date Route   Pfizer COVID-19 Vaccine 01/29/2020 12:56 PM 0.3 mL 11/24/2019 Intramuscular   Manufacturer: [place]   Lot: [id number]   [place]: [id number]

## 2020-02-15 ENCOUNTER — Other Ambulatory Visit: Payer: Self-pay | Admitting: Psychiatry

## 2020-02-15 DIAGNOSIS — G2401 Drug induced subacute dyskinesia: Secondary | ICD-10-CM

## 2020-02-28 DIAGNOSIS — H9222 Otorrhagia, left ear: Secondary | ICD-10-CM | POA: Diagnosis not present

## 2020-02-28 DIAGNOSIS — H6123 Impacted cerumen, bilateral: Secondary | ICD-10-CM | POA: Diagnosis not present

## 2020-03-01 ENCOUNTER — Telehealth: Payer: Self-pay | Admitting: Psychiatry

## 2020-03-01 ENCOUNTER — Other Ambulatory Visit: Payer: Self-pay

## 2020-03-01 MED ORDER — HYDROXYZINE HCL 10 MG PO TABS: ORAL_TABLET | ORAL | 0 refills | Status: DC

## 2020-03-01 NOTE — Telephone Encounter (Signed)
Pt left message asking if you would give him a sleeping pill and he could take less other meds. Taken Hydroxyzine in past 25 mg works for him. [facility] [place] . APPT 3/23 TELEHEALTH

## 2020-03-01 NOTE — Telephone Encounter (Signed)
Rx submitted to [facility], patient's wife notified.

## 2020-03-01 NOTE — Telephone Encounter (Signed)
Okay hydroxyzine 10 mg tablets 1-2 nightly as needed insomnia #10 no refills.  Told not to take it with Benadryl.

## 2020-03-05 ENCOUNTER — Ambulatory Visit (INDEPENDENT_AMBULATORY_CARE_PROVIDER_SITE_OTHER): Payer: Medicare HMO | Admitting: Psychiatry

## 2020-03-05 ENCOUNTER — Encounter: Payer: Self-pay | Admitting: Psychiatry

## 2020-03-05 ENCOUNTER — Telehealth: Payer: Self-pay | Admitting: Psychiatry

## 2020-03-05 DIAGNOSIS — G2401 Drug induced subacute dyskinesia: Secondary | ICD-10-CM

## 2020-03-05 DIAGNOSIS — F333 Major depressive disorder, recurrent, severe with psychotic symptoms: Secondary | ICD-10-CM

## 2020-03-05 DIAGNOSIS — F5105 Insomnia due to other mental disorder: Secondary | ICD-10-CM

## 2020-03-05 DIAGNOSIS — F251 Schizoaffective disorder, depressive type: Secondary | ICD-10-CM

## 2020-03-05 DIAGNOSIS — R69 Illness, unspecified: Secondary | ICD-10-CM | POA: Diagnosis not present

## 2020-03-05 DIAGNOSIS — F411 Generalized anxiety disorder: Secondary | ICD-10-CM | POA: Diagnosis not present

## 2020-03-05 DIAGNOSIS — F061 Catatonic disorder due to known physiological condition: Secondary | ICD-10-CM

## 2020-03-05 MED ORDER — LORAZEPAM 0.5 MG PO TABS: 0.5000 mg | ORAL_TABLET | Freq: Four times a day (QID) | ORAL | 1 refills | Status: DC

## 2020-03-05 MED ORDER — HYDROXYZINE HCL 25 MG PO TABS: 25.0000 mg | ORAL_TABLET | Freq: Every evening | ORAL | 0 refills | Status: DC | PRN

## 2020-03-05 MED ORDER — SERTRALINE HCL 50 MG PO TABS: 75.0000 mg | ORAL_TABLET | Freq: Every day | ORAL | 2 refills | Status: DC

## 2020-03-05 NOTE — Progress Notes (Signed)
Neil [name] [id number] 01/28/1941 79 y.o.   Virtual Visit via [place]  I connected with pt by WebEx and verified that I am speaking with the correct person using two identifiers.   I discussed the limitations, risks, security and privacy concerns of performing an evaluation and management service by Jackquline [place] and the availability of in person appointments. I also discussed with the patient that there may be a patient responsible charge related to this service. The patient expressed understanding and agreed to proceed.  I discussed the assessment and treatment plan with the patient. The patient was provided an opportunity to ask questions and all were answered. The patient agreed with the plan and demonstrated an understanding of the instructions.   The patient was advised to call back [name] seek an in-person evaluation if the symptoms worsen [name] if the condition fails to improve as anticipated.  I provided 30 minutes of video time during this encounter. The call started at 245 and ended at 26. The patient was located at home and the provider was located office.   Subjective:   Patient ID:  Neil [name] is a 79 y.o. (DOB 07-28-1941) male.  Chief Complaint:  Chief Complaint  Patient presents with  . Follow-up     Medication Management  . Medication Refill    Hydroxyzine  . Anxiety     Medication Management  . Depression     Medication Management  . Medication Problem    Anxiety Symptoms include decreased concentration and nervous/anxious behavior. Patient reports no confusion [name] dizziness.    Depression        Associated symptoms include decreased concentration.  Associated symptoms include no appetite change.  Past medical history includes anxiety.   Medication Refill Pertinent negatives include no weakness.    Neil [name] presents today for follow-up of psychosis, depression, and anxiety.  He has required an urgent appointment because of wanting further med changes and  frequent phone calls with med changes since he was last here  When seen September 3 he was switched off olanzapine because of EPS and switched on to Saphris which was raised to 5 mg nightly but only took that dose 1 time.  The rest of the time he took only 2.5 mg nightly.   He called back 5 days later stating he wanted to stop it because it was causing him to hold his mouth open and he was having a worsening time with tongue movements and felt "funny".  He was encouraged to give it more time but he would not agree to do so.  He stopped it Friday 9/11 and still felt the funny tense feeling.    When  seen September 01, 2019.  The decision was made to switch from [place] to [place] because of difficulty getting an adequate response and limited side effects from [place].  We also discussed the antipsychotic and the plan was to start [place] but those samples were not available so he decided to restart Saphris 2.5 mg nightly.  At the visit September 15, 2011 Neil [name] was increased to 12 mg twice daily, Saphris was discontinued and Caplyta 42 mg daily was started because of his low EPS risk.  He still needed an antipsychotic.  He continued on sertraline 75 mg daily because he felt it helped his depression.  Wife called 10/8 and said the following: On Caplyta,  Thinks it is too strong.  His movements are slow, sleeping too much, drowsy during day ( doesn't really wake  up until 4pm after sleeping all night). Takes him along time to think and respond. Had trouble with mouth and jaw before, and now he is holding his mouth open all the time.  Paranoia is better though. Was told to take it 3 days weekly.    October 16 Neil [name] increased to 18 mg BID  Called again November 9 stating he couldn't tolerate either med.  He stopped the Caplyta and reduced the Neil [name] to 9 mg BID a few days ago.  CO rigidity in lower back and arms.  Still bothered by constant tongue movements.  No change in worry, mood but paranoia is  reduced since Caplyta.   visit October 25, 2019.  He had stopped Caplyta.  He had also reduced Neil [name] to 9 mg twice daily on his own.  He has been making frequent med changes [place] which is made it extremely difficult to get an antipsychotic to deal with his psychotic symptoms as well as adjust the Neil [name] today with tardive dyskinesia symptoms.  He is very impatient with med changes.  Therefore we did not start a new antipsychotic at the last visit.  He tends to make frequent phone calls and did call again on November 23 complaining of feeling very restless and difficulty sleeping.  Was prescribed clonazepam 0.5 mg twice daily.  He called back the next day stating he wanted to stop the Neil [name] due to restlessness but he had never taken the prescribed clonazepam.  Using courage to follow the treatment plan and take the clonazepam.  He called again the next day stating he still wanted to stop Neil [name] and the clonazepam was making his gait unsteady.  He was informed that if he stopped the Neil [name] his mouth movements would get worse which is been a leading complaint but he could stop it if he wanted to.  December 4 he reports clonazepam has helped the anxiety and less guilt and less rigidity.  Taking TID and only SE is a little slower movements.  1 fall, slipped on Ba floor where water was lying.  No injury.   Gained 3# and better appetite for the first time in a long time.  CO tongue movements.  Restlessness is now bearable.  Less anxiety overall.  No AH.    visit December 4.  His psychosis was not severe and the decision was made not to start another antipsychotic but instead to try to optimize treatment for tardive dyskinesia and Neil [name] was increased to 12 mg twice daily. He called back immediately after the head visit admitting he had given false information during the visit and that he not actually been taking the Neil [name] in the way he told me during the visit making it necessary to  change the Neil [name] to 9 mg twice daily.  He was confronted about his noncompliance and warned that he may be discharged from the practice if he continued to be misleading and noncompliant with the physician.  This is not only compromising care but it is increasing his own risk because he is giving false information.   visit December 21, 2019.  Because of his complaints about balance issues clonazepam was reduced and spread out to 0.25 mg 4 times daily.  Also for tardive dyskinesia symptoms Neil [name] was increased to 12 mg twice daily.  Prior to that visit he admitted being generally noncompliant with the Neil [name] making it difficult to judge what dose to prescribe.  He was continued on sertraline 75 mg daily as he  felt that was helpful for his anxiety. Says he's compliant with med changes last visit.  Tongue movements are better but not gone. No problems with the Neil [name].  Stiffness comes and goes and located in head and eyes.  Denies it's vertigo.  Intensity is less.  If shuts his eyes it goes right away.  visit January 04, 2020.  Neil [name] remained 12 mg twice daily and sertraline 75 mg daily.  The following was changed: switch to lorazepam bc tiredness and balance problems to Ativan. Yes DC clonazepam and start lorazepam 0.5 mg 4 times daily  Last seen February 8 the patient reports the following.  Yes it did help.  Not as slowed and balance is good now.  Still feels tired with tendency to want to lay down and knows he needs to work on this.  Anxiety is pretty fair.  We decided to reduce the lorazepam to 0.5 mg 3 times daily in hopes of improving energy and balance further.  No other meds were changed.  He is still not on an antipsychotic.   Anxiety comes and goes but sometimes able to get by with TID lorazepam and energy is better.  Wife says sometimes gets uptight and can't relax so needs QID.Pt reports that mood is anxiety aand not much depression and describes anxiety as worry. Anxiety symptoms include:  worry not panic and moderate.   Sleep is better usually with hydroxyzine and sometimes lorazepam. Pt reports that appetite is Much better with weight up to 131# as low as 120#.  Normal was 140#.Marland Kitchen Pt reports that energy is better and OK. Concentration is fair.. Suicidal thoughts:  None. Denies sig paranoid thought.   No history of suicide attempts.  Son [name] living with them.  Become more committed [place].  He has schizophrenia.   Past Psychiatric Medication Trials: Mirtazapine,  olanzapine 5 EPS, perphenazine started TD with mouth px,  Saphris 5 mg and stopped due to EPS complaints and feeling "funny" Caplyta side effects risperidone 2 tremors  Buspirone,  amitriptyline 25,   Hydroxyzine 10-20 Clonazepam 0.5 TID dizzy and sleepy Ativan 0.5 mg QID Ingrezza and  Neil [name] 12 mg BID  Review of Systems:  Review of Systems  Constitutional: Negative for appetite change.  Gastrointestinal: Negative for constipation.  Neurological: Positive for tremors. Negative for dizziness, syncope and weakness.       No falls But complain of poor balance  Psychiatric/Behavioral: Positive for decreased concentration, depression and dysphoric mood. Negative for agitation, behavioral problems, confusion, hallucinations, self-injury and sleep disturbance. The patient is nervous/anxious. The patient is not hyperactive.     Medications: I have reviewed the patient's current medications.  Current Outpatient Medications  Medication Sig Dispense Refill  . Neil [name] 12 MG TABS TAKE 1 TABLET BY MOUTH TWICE A DAY 60 tablet 1  . diphenhydrAMINE HCl (BENADRYL ALLERGY PO) Take by mouth.    . docusate sodium (COLACE) 100 MG capsule Take 1 capsule (100 mg total) by mouth every 12 (twelve) hours. 60 capsule 0  . hydrOXYzine (ATARAX/VISTARIL) 25 MG tablet Take 1 tablet (25 mg total) by mouth at bedtime as needed (sleep). Take 1-2 tablets by mouth at bedtime as needed for insomnia 30 tablet 0  . LORazepam (ATIVAN) 0.5 MG  tablet Take 1 tablet (0.5 mg total) by mouth 4 (four) times daily. 120 tablet 1  . Melatonin-Pyridoxine (MELATIN PO) Take by mouth.    . Multiple Vitamin (MULTIVITAMIN WITH MINERALS) TABS tablet Take 1 tablet by mouth daily.    Marland Kitchen  sertraline (ZOLOFT) 50 MG tablet Take 1.5 tablets (75 mg total) by mouth daily. 45 tablet 2   No current facility-administered medications for this visit.    Medication Side Effects: None  Allergies:  Allergies  Allergen Reactions  . Aspirin Swelling and Other (See Comments)    Reaction:  Tongue swelling   . Sulfur Other (See Comments)    Reaction:  Unknown   . Tetanus Toxoids Swelling and Other (See Comments)    Reaction:  Arm swelling    Past Medical History:  Diagnosis Date  . Anxiety   . Depression     History reviewed. No pertinent family history.  Social History   Socioeconomic History  . Marital status: Married    Spouse name: Not on file  . Number of children: Not on file  . Years of education: Not on file  . Highest education level: Not on file  Occupational History  . Not on file  Tobacco Use  . Smoking status: Never Smoker  . Smokeless tobacco: Never Used  Substance and Sexual Activity  . Alcohol use: No  . Drug use: Not on file  . Sexual activity: Not on file  Other Topics Concern  . Not on file  Social History Narrative  . Not on file   Social Determinants of Health   Financial Resource Strain:   . Difficulty of Paying Living Expenses:   Food Insecurity:   . Worried About Charity fundraiser in the Last Year:   . Arboriculturist in the Last Year:   Transportation Needs:   . Film/video editor (Medical):   Marland Kitchen Lack of Transportation (Non-Medical):   Physical Activity:   . Days of Exercise per Week:   . Minutes of Exercise per Session:   Stress:   . Feeling of Stress :   Social Connections:   . Frequency of Communication with Friends and Family:   . Frequency of Social Gatherings with Friends and Family:   .  Attends Religious Services:   . Active Member of Clubs [name] Organizations:   . Attends Archivist Meetings:   Marland Kitchen Marital Status:   Intimate Partner Violence:   . Fear of Current [name] Ex-Partner:   . Emotionally Abused:   Marland Kitchen Physically Abused:   . Sexually Abused:     Past Medical History, Surgical history, Social history, and Family history were reviewed and updated as appropriate.   Please see review of systems for further details on the patient's review from today.   Objective:   Physical Exam:  There were no vitals taken for this visit.  Physical Exam Neurological:     Mental Status: He is alert and oriented to person, place, and time.     Cranial Nerves: No dysarthria.  Psychiatric:        Attention and Perception: He is attentive.        Mood and Affect: Mood is anxious. Mood is not depressed.        Speech: Speech normal.        Behavior: Behavior is cooperative.        Thought Content: Thought content normal. Thought content is not paranoid [name] delusional. Thought content does not include homicidal [name] suicidal ideation. Thought content does not include homicidal [name] suicidal plan.        Cognition and Memory: Cognition and memory normal.        Judgment: Judgment normal.     Comments: Insight fair.  Lab Review:  No results found for: NA, K, CL, CO2, GLUCOSE, BUN, CREATININE, CALCIUM, PROT, ALBUMIN, AST, ALT, ALKPHOS, BILITOT, GFRNONAA, GFRAA  No results found for: WBC, RBC, HGB, HCT, PLT, MCV, MCH, MCHC, RDW, LYMPHSABS, MONOABS, EOSABS, BASOSABS I will  No results found for: POCLITH, LITHIUM   No results found for: PHENYTOIN, PHENOBARB, VALPROATE, CBMZ   .res Assessment: Plan:    Neil [name] was seen today for follow-up, medication refill, anxiety, depression and medication problem.  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 -     sertraline (ZOLOFT) 50 MG tablet; Take 1.5 tablets (75 mg total) by mouth daily.  Insomnia due to mental condition -     hydrOXYzine (ATARAX/VISTARIL) 25 MG tablet; Take 1 tablet (25 mg total) by mouth at bedtime as needed (sleep). Take 1-2 tablets by mouth at bedtime as needed for insomnia  Severe recurrent major depression with psychotic features, mood-congruent (HCC) -     sertraline (ZOLOFT) 50 MG tablet; Take 1.5 tablets (75 mg total) by mouth daily.    Greater than 50% of 30-minute face to face time with patient was spent on counseling and coordination of care. We discussed Patient has history of severe recurrent major depression with psychotic features.  There are also elements of OCD.  The patient has been extremely difficult to treat because of medication sensitivity and frequently changing medications on his own.    This has been discussed with him as compromising his treatment outcome.  He is very impatient with med trials and frequently calls between appointments wanting medicine changes further compromising treatment outcome.  This has been discussed with him and is improved recently.  His paranoid and anxious are improved.  now feel he denies feeling significantly sad [name] depressed though he describes a sense of being unwell with a little guilt.Marland Kitchen    His son has schizophrenia so there may be a genetic predisposition to psychosis.  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        He has benefited with reduction in anxiety from the use of sertraline and benzo.  Again his depression is better with the sertraline.  Less shame but that may be mild paranoia [name] anxiety and not depression. Continue sertraline to 75 daily.  He says he's less depressed and  it has helped his anxiety. Consider increase but prefer one med change at a time.  Disc SE in detail and SSRI withdrawal sx.  Consider increase bc still has anhedonia.  Paranoia is better at present.  Call if it gets worse  For cost reasons and low EPS would consider use loxapine if needed.  Another option would be quetiapine with low EPS but is fairly sedating not sure he can tolerate it.  Consider Latuda but it has a fair EPS risk as well.  However it might also help his depression allowing for less sertraline.  We will hold off starting any new antipsychotic as long as possible.    Spending too much time in bed.  This may be contributing to his complaints of back pain.  Continue Ativan 0.5 mg TID-QID to see if energy is better.  Keep the lowest effective dose possible. He says hydroxyzine 10 to 20 mg does not help sleep consistently.  We will adjust to 25 mg as needed but do not take it if it is not needed.  Do not exceed 4 Ativan a day.  No new antipsychotics started today. Continue Neil [name] 12 mg twice daily.  The tardive dyskinesia symptoms will not improve until that dose is increased further however we are attempting to minimize "rigidity" .  His tongue movements are improved with the increase Neil [name].  Continue sertraline 75 mg daily for anxiety. He feels this is helpful.  Rec walk outside again and try to increase activity in order to improve mood and his sense of confidence and normalcy.  FU 3 months  Lynder Parents, MD, DFAPA     No future appointments.  No orders of the defined types were placed in this encounter.     -------------------------------

## 2020-03-06 NOTE — Telephone Encounter (Signed)
Not needed - error

## 2020-03-13 ENCOUNTER — Other Ambulatory Visit: Payer: Self-pay | Admitting: Psychiatry

## 2020-03-13 DIAGNOSIS — F5105 Insomnia due to other mental disorder: Secondary | ICD-10-CM

## 2020-03-13 NOTE — Telephone Encounter (Signed)
90 day okay? 

## 2020-03-14 ENCOUNTER — Telehealth: Payer: Self-pay | Admitting: Psychiatry

## 2020-03-14 NOTE — Telephone Encounter (Signed)
Pt called back and stated that he will continue taking the sleeping pills that he already has.

## 2020-03-14 NOTE — Telephone Encounter (Signed)
Pt called stating his sleeping pill is not working. He is requesting to have something else prescribed and he'll pick it up at the [facility] on [address]

## 2020-03-14 NOTE — Telephone Encounter (Signed)
Noted  

## 2020-03-22 ENCOUNTER — Telehealth: Payer: Self-pay | Admitting: Psychiatry

## 2020-03-22 NOTE — Telephone Encounter (Signed)
Left message to call back  

## 2020-03-22 NOTE — Telephone Encounter (Signed)
Spoke with patient and we discussed his sleep meds, he did not pick up the higher strength of Hydroxyzine 25 mg prescribed last week. Advised him to pick that up today and try it over the weekend to see if it improved his sleep, he was only taking 10 mg.

## 2020-03-22 NOTE — Telephone Encounter (Signed)
Pt called requesting a call back to discuss the current medication he is taking for Insomnia. Callback # [phone]

## 2020-03-27 ENCOUNTER — Telehealth: Payer: Self-pay | Admitting: Psychiatry

## 2020-03-27 NOTE — Telephone Encounter (Signed)
Pt says that the left side of his tongue is swollen. HE is taking [name]. Does Dr. Clovis Pu need to see it?

## 2020-03-28 NOTE — Telephone Encounter (Signed)
I thought I sent this message yesterday and if I did you can ignore this 1.  None of the meds he is taking should cause his tongue to swell.  He should see his primary care doctor or his dentist.

## 2020-04-01 NOTE — Telephone Encounter (Signed)
I do not want to make further med changes over the phone with him.  We need to wait for an appointment

## 2020-04-02 ENCOUNTER — Telehealth: Payer: Self-pay | Admitting: Psychiatry

## 2020-04-02 DIAGNOSIS — H5203 Hypermetropia, bilateral: Secondary | ICD-10-CM | POA: Diagnosis not present

## 2020-04-02 NOTE — Telephone Encounter (Signed)
Spoke to patient's wife and confirmed he's taking his [name] as prescribed, she said he's not missed any but she's going to start watching to make sure. Advised to call back with worsening symptoms.

## 2020-04-02 NOTE — Telephone Encounter (Signed)
Wife wants to know if there is anything else you can prescribe for Neil [name]. His tongue and mouth is very restless.

## 2020-04-02 NOTE — Telephone Encounter (Signed)
See previous phone message. 

## 2020-04-03 ENCOUNTER — Other Ambulatory Visit: Payer: Self-pay | Admitting: Psychiatry

## 2020-04-03 DIAGNOSIS — F5105 Insomnia due to other mental disorder: Secondary | ICD-10-CM

## 2020-04-11 DIAGNOSIS — M20039 Swan-neck deformity of unspecified finger(s): Secondary | ICD-10-CM | POA: Diagnosis not present

## 2020-04-11 DIAGNOSIS — R69 Illness, unspecified: Secondary | ICD-10-CM | POA: Diagnosis not present

## 2020-04-11 DIAGNOSIS — E782 Mixed hyperlipidemia: Secondary | ICD-10-CM | POA: Diagnosis not present

## 2020-04-11 DIAGNOSIS — Z0000 Encounter for general adult medical examination without abnormal findings: Secondary | ICD-10-CM | POA: Diagnosis not present

## 2020-04-11 DIAGNOSIS — Z1211 Encounter for screening for malignant neoplasm of colon: Secondary | ICD-10-CM | POA: Diagnosis not present

## 2020-04-11 DIAGNOSIS — Z23 Encounter for immunization: Secondary | ICD-10-CM | POA: Diagnosis not present

## 2020-04-11 DIAGNOSIS — R7303 Prediabetes: Secondary | ICD-10-CM | POA: Diagnosis not present

## 2020-04-11 DIAGNOSIS — N400 Enlarged prostate without lower urinary tract symptoms: Secondary | ICD-10-CM | POA: Diagnosis not present

## 2020-04-11 DIAGNOSIS — Z125 Encounter for screening for malignant neoplasm of prostate: Secondary | ICD-10-CM | POA: Diagnosis not present

## 2020-04-11 DIAGNOSIS — R011 Cardiac murmur, unspecified: Secondary | ICD-10-CM | POA: Diagnosis not present

## 2020-04-12 ENCOUNTER — Other Ambulatory Visit: Payer: Self-pay | Admitting: Psychiatry

## 2020-04-12 DIAGNOSIS — G2401 Drug induced subacute dyskinesia: Secondary | ICD-10-CM

## 2020-04-15 ENCOUNTER — Telehealth: Payer: Self-pay | Admitting: Psychiatry

## 2020-04-15 NOTE — Telephone Encounter (Signed)
At follow-up will recommend he consult a neurologist.

## 2020-04-15 NOTE — Telephone Encounter (Signed)
Pt wife wants to know if there is another medication instead of ingrezza and the austedo due to involuntary movement of his tongue.

## 2020-04-21 ENCOUNTER — Other Ambulatory Visit: Payer: Self-pay | Admitting: Psychiatry

## 2020-04-21 DIAGNOSIS — F333 Major depressive disorder, recurrent, severe with psychotic symptoms: Secondary | ICD-10-CM

## 2020-04-21 DIAGNOSIS — F411 Generalized anxiety disorder: Secondary | ICD-10-CM

## 2020-04-24 DIAGNOSIS — R011 Cardiac murmur, unspecified: Secondary | ICD-10-CM | POA: Diagnosis not present

## 2020-04-26 ENCOUNTER — Other Ambulatory Visit: Payer: Self-pay | Admitting: Psychiatry

## 2020-04-26 DIAGNOSIS — F5105 Insomnia due to other mental disorder: Secondary | ICD-10-CM

## 2020-04-26 DIAGNOSIS — F411 Generalized anxiety disorder: Secondary | ICD-10-CM

## 2020-04-26 NOTE — Telephone Encounter (Signed)
Doesn't look due until next week on both.

## 2020-04-26 NOTE — Telephone Encounter (Signed)
Neil [name] called requesting refills on [facility]

## 2020-04-30 NOTE — Telephone Encounter (Signed)
Neil [name] wife called again checking on when the scripts would be sent in

## 2020-05-03 ENCOUNTER — Encounter: Payer: Self-pay | Admitting: Internal Medicine

## 2020-05-03 ENCOUNTER — Other Ambulatory Visit: Payer: Self-pay

## 2020-05-03 ENCOUNTER — Ambulatory Visit: Payer: Medicare HMO | Admitting: Internal Medicine

## 2020-05-03 VITALS — BP 122/75 | HR 59 | Ht 65.0 in | Wt 134.6 lb

## 2020-05-03 DIAGNOSIS — I350 Nonrheumatic aortic (valve) stenosis: Secondary | ICD-10-CM | POA: Diagnosis not present

## 2020-05-03 NOTE — Patient Instructions (Signed)
Medication Instructions:  The current medical regimen is effective;  continue present plan and medications.  *If you need a refill on your cardiac medications before your next appointment, please call your pharmacy*   Follow-Up: At [facility], you and your health needs are our priority.  As part of our continuing mission to provide you with exceptional heart care, we have created designated Provider Care Teams.  These Care Teams include your primary Cardiologist (physician) and Advanced Practice Providers (APPs -  Physician Assistants and Nurse Practitioners) who all work together to provide you with the care you need, when you need it.  We recommend signing up for the patient portal called "MyChart".  Sign up information is provided on this After Visit Summary.  MyChart is used to connect with patients for Virtual Visits (Telemedicine).  Patients are able to view lab/test results, encounter notes, upcoming appointments, etc.  Non-urgent messages can be sent to your provider as well.   To learn more about what you can do with MyChart, go to [email or web address].    Your next appointment:   2 month(s)  The format for your next appointment:   In Person  Provider:   Raliegh Ip Mali Hilty, MD   Other Instructions Referral to Valve Team was placed- they will contact you to set up appointment.

## 2020-05-05 ENCOUNTER — Encounter: Payer: Self-pay | Admitting: Internal Medicine

## 2020-05-05 NOTE — Progress Notes (Signed)
OFFICE CONSULT NOTE  Chief Complaint:  Severe aortic stenosis  Primary Care Physician: Neil Contras, MD  HPI:  Neil [name] is a 79 y.o. male who is being seen today for the evaluation of severe aortic stenosis at the request of Neil Contras, MD.  This is a pleasant 79 year old male kindly referred for evaluation and management of severe aortic stenosis.  Apparently he has been followed for an abnormal heart murmur which she says he is had for a long time, possibly since he was a child.  He recalls having had an echo with Neil [name] about 5 or 6 years ago and was told that he had aortic valve disease but clearly has not had follow-up closely until recently.  His PCP noted a change in the quality of his murmur and a repeat echo was ordered.  This was performed at [facility] with outside reading, and I personally reviewed the report but did not have access to the images.  This demonstrated an LVEF of 60 to 65% with mild LVH, normal left atrial size, moderate calcification of the mitral valve leaflets and minimal mitral regurgitation without mitral stenosis.  The aortic valve was moderately calcified and there was severe aortic stenosis.  The mean gradient across the aortic valve was 64 mmHg and a peak gradient of 90 mmHg.  The aortic root diameter was normal.  This was discussed with the patient and he was felt to be asymptomatic, flatly denying any chest pain, worsening shortness of breath with exertion, presyncope or syncopal episodes.  PMHx:  Past Medical History:  Diagnosis Date  . Anxiety   . Depression   . Dyslipidemia   . Murmur, cardiac     Past Surgical History:  Procedure Laterality Date  . EYE SURGERY    . FOOT SURGERY Left   . LIPOMA EXCISION      FAMHx:  Family History  Problem Relation Age of Onset  . Hypertension Mother   . Heart attack Father   . Hypertension Sister   . Hypertension Brother     SOCHx:   reports that he has never smoked. He has never used smokeless  tobacco. He reports that he does not drink alcohol. No history on file for drug.  ALLERGIES:  Allergies  Allergen Reactions  . Aspirin Swelling and Other (See Comments)    Reaction:  Tongue swelling   . Sulfur Other (See Comments)    Reaction:  Unknown   . Tetanus Toxoids Swelling and Other (See Comments)    Reaction:  Arm swelling    ROS: Pertinent items noted in HPI and remainder of comprehensive ROS otherwise negative.  HOME MEDS: Current Outpatient Medications on File Prior to Visit  Medication Sig Dispense Refill  . [name] 12 MG TABS TAKE 1 TABLET BY MOUTH TWICE A DAY 60 tablet 1  . docusate sodium (COLACE) 100 MG capsule Take 1 capsule (100 mg total) by mouth every 12 (twelve) hours. 60 capsule 0  . hydrOXYzine (ATARAX/VISTARIL) 25 MG tablet TAKE 1 TABLET BY MOUTH AT BEDTIME AS NEEDED 30 tablet 1  . LORazepam (ATIVAN) 0.5 MG tablet TAKE 1 TABLET (0.5 MG TOTAL) BY MOUTH 4 (FOUR) TIMES DAILY. 120 tablet 1  . Multiple Vitamin (MULTIVITAMIN WITH MINERALS) TABS tablet Take 1 tablet by mouth daily.    . sertraline (ZOLOFT) 50 MG tablet TAKE 1 AND 1/2 TABLETS BY MOUTH DAILY 135 tablet 0   No current facility-administered medications on file prior to visit.    LABS/IMAGING: No  results found for this or any previous visit (from the past 38 hour(s)). No results found.  LIPID PANEL: No results found for: CHOL, TRIG, HDL, CHOLHDL, VLDL, LDLCALC, LDLDIRECT  WEIGHTS: Wt Readings from Last 3 Encounters:  05/03/20 134 lb 9.6 oz (61.1 kg)  07/02/17 139 lb (63 kg)    VITALS: BP 122/75   Pulse (!) 59   Ht 5\' 5"  (1.651 m)   Wt 134 lb 9.6 oz (61.1 kg)   SpO2 97%   BMI 22.40 kg/m   EXAM: General appearance: alert and no distress Neck: no carotid bruit, no JVD and thyroid not enlarged, symmetric, no tenderness/mass/nodules Lungs: clear to auscultation bilaterally Heart: regular rate and rhythm, S1: normal, S2: decreased intensity and systolic murmur: systolic ejection 3/6,  harsh at 2nd right intercostal space Abdomen: soft, non-tender; bowel sounds normal; no masses,  no organomegaly Extremities: extremities normal, atraumatic, no cyanosis or edema Pulses: 2+ and symmetric Skin: Skin color, texture, turgor normal. No rashes or lesions Neurologic: Grossly normal Psych: Pleasant  EKG: Sinus bradycardia 59, possible left atrial enlargement, nonspecific ST changes-personally reviewed  ASSESSMENT: 1. Severe asymptomatic aortic stenosis-mean gradient 64 mmHg  PLAN: 1.   [name] indeed has severe aortic stenosis but claims to be asymptomatic.  He is reasonably active.  I discussed at length the options available to him today.  I have also in the past exercise such patients to see if they really are symptomatic.  I will reach out to our multidisciplinary valve clinic to see what their thoughts are and if they wish to start following him.  He would be a good candidate in my opinion for TAVR.  Plan follow-up with me afterwards.  Thanks again for the kind referral.  Pixie Casino, MD, FACC, [place] Director of the [facility] Lipid [facility] &  Cardiovascular Risk Reduction Clinic Diplomate of the American Board of Clinical Lipidology Attending Cardiologist  Direct Dial: [phone]  Fax: [phone]  Website:  [email or web address] 05/05/2020, 9:34 PM

## 2020-05-06 ENCOUNTER — Telehealth: Payer: Self-pay | Admitting: Psychiatry

## 2020-05-06 NOTE — Telephone Encounter (Signed)
PT's wife, Vaughan Basta, called. She wants to talk to [place] about his 'tongue' medication. She wants to make sure there is not another medication he can try?

## 2020-05-07 NOTE — Telephone Encounter (Signed)
I will not change his med until his next appt

## 2020-05-08 ENCOUNTER — Telehealth: Payer: Self-pay | Admitting: Internal Medicine

## 2020-05-08 DIAGNOSIS — I350 Nonrheumatic aortic (valve) stenosis: Secondary | ICD-10-CM

## 2020-05-08 DIAGNOSIS — Z79899 Other long term (current) drug therapy: Secondary | ICD-10-CM

## 2020-05-08 NOTE — Telephone Encounter (Signed)
The patient wanted to know the reason why the patient needs another Echo. The patient said he had one recently and is not sure if his insurance will cover another one so soon. Please advise

## 2020-05-08 NOTE — Telephone Encounter (Signed)
Spoke with patient & wife about new orders. They are agreeable. They would like labs done same day as echo at [address] office - will arrange once echo is scheduled

## 2020-05-08 NOTE — Telephone Encounter (Signed)
Labs & echo ordered  LM for patient to call back to discuss new orders

## 2020-05-08 NOTE — Telephone Encounter (Signed)
Message sent to MD as I personally talked with wife/patient about echo and reason another test was needed, per Dr. Debara Pickett and TAVR doctor

## 2020-05-08 NOTE — Addendum Note (Signed)
Addended by: Fidel Levy on: 05/08/2020 01:27 PM   Modules accepted: Orders

## 2020-05-08 NOTE — Telephone Encounter (Signed)
-----   Message from Pixie Casino, MD sent at 05/07/2020 10:49 AM EDT ----- Regarding: FW: Severe Asymptomatic AS Neil [name]-  Discussed case with Dr. Burt Knack, he recommends repeating the echo now and check labs - BMET and BNP (as below).  Thanks.  Mali ----- Message ----- From: Sherren Mocha, MD Sent: 05/06/2020   8:00 AM EDT To: Pixie Casino, MD Subject: RE: Severe Asymptomatic AS                     I would repeat echo on our system and check a BNP.  If peak velocity > 5 m/s and/or BNP elevated he probably should have AVR. I'd be happy to see him just let me know. ----- Message ----- From: Pixie Casino, MD Sent: 05/05/2020   9:38 PM EDT To: Sherren Mocha, MD Subject: Severe Asymptomatic AS                         Ronalee Belts - saw this guy on Friday. New diagnosis of severe asymptomatic AS - mean gradient per echo through [facility] is 64 mmHg. In the past I have cautiously exercised such patients on the treadmill to provoke symptoms - would you still recommend this or close follow-up repeat echo in 6 months. Should he establish at the valve clinic at this point?  Thanks.  -Mali

## 2020-05-09 NOTE — Telephone Encounter (Signed)
The structural heart clinic wants to confirm the findings and based on the [facility], may proceed with recommendation for valve replacement sooner than later.  Dr Lemmie Evens

## 2020-05-09 NOTE — Telephone Encounter (Signed)
Patient given message, has f/u on 06/04/2020

## 2020-05-09 NOTE — Telephone Encounter (Signed)
Spoke with patient's wife about echo and reasoning for additional test. Also advised on testing location. No additional assistance needed at this time.

## 2020-05-09 NOTE — Telephone Encounter (Signed)
LMTCB

## 2020-05-09 NOTE — Telephone Encounter (Signed)
Follow Up: ° ° ° °Returning your call from this morning. °

## 2020-05-14 ENCOUNTER — Telehealth: Payer: Self-pay

## 2020-05-14 NOTE — Telephone Encounter (Signed)
Thank you :)

## 2020-05-14 NOTE — Telephone Encounter (Signed)
Per Dr. Debara Pickett, scheduled patient for TAVR consult with Dr. Burt Knack 6/17. The patient and his wife were grateful for call and agree with treatment plan.

## 2020-05-15 ENCOUNTER — Telehealth: Payer: Self-pay | Admitting: Psychiatry

## 2020-05-15 NOTE — Telephone Encounter (Signed)
Sleep med, Vistaril, is not helping with sleep. Wants to get another option. And/Or any thing OTC for sleep? Melatonin?

## 2020-05-15 NOTE — Telephone Encounter (Signed)
Ok to try melatonin 3-5 mg at night and or valerian root.

## 2020-05-16 NOTE — Telephone Encounter (Signed)
Patient aware.

## 2020-05-20 ENCOUNTER — Telehealth: Payer: Self-pay | Admitting: Internal Medicine

## 2020-05-20 NOTE — Telephone Encounter (Signed)
Patient's wife states the patient fell 3 or 4 weeks ago and hit his chest. She states he is not having any symptoms, but would like to know if he would need any x rays before his echo 05/28/2020.

## 2020-05-20 NOTE — Telephone Encounter (Signed)
Based on no symptoms, he does not need a CXR, thanks.  Dr Lemmie Evens

## 2020-05-20 NOTE — Telephone Encounter (Signed)
Pt advised and will keep his 05/28/20 appt for his Echo.

## 2020-05-20 NOTE — Telephone Encounter (Signed)
Pts wife, Vaughan Basta, called tor report the pt told her that he  [address] after he tripped about a month ago, he says he did not pass out but he hit his chest on the arm of his recliner.. she said he did not tell her it happened until now in conversation. She says he denies any dizziness/ palpitations when it happened.   He does not have pain, no bruising, no breathing difficulties.   She says he is having an echo 05/28/20 and wondered of Dr. Debara Pickett thought he should have a chest Xray also to be sure no injury.. I advised her that he may not need it if he is feeling well but can contimue to monitor..   Will forward to Dr. Debara Pickett for review.

## 2020-05-28 ENCOUNTER — Other Ambulatory Visit: Payer: Medicare HMO

## 2020-05-28 ENCOUNTER — Other Ambulatory Visit: Payer: Self-pay

## 2020-05-28 ENCOUNTER — Ambulatory Visit (HOSPITAL_COMMUNITY): Payer: Medicare HMO | Attending: Cardiovascular Disease

## 2020-05-28 DIAGNOSIS — I350 Nonrheumatic aortic (valve) stenosis: Secondary | ICD-10-CM

## 2020-05-28 DIAGNOSIS — Z79899 Other long term (current) drug therapy: Secondary | ICD-10-CM | POA: Diagnosis not present

## 2020-05-29 LAB — BASIC METABOLIC PANEL
BUN/Creatinine Ratio: 22 (ref 10–24)
BUN: 18 mg/dL (ref 8–27)
CO2: 26 mmol/L (ref 20–29)
Calcium: 9 mg/dL (ref 8.6–10.2)
Chloride: 92 mmol/L — ABNORMAL LOW (ref 96–106)
Creatinine, Ser: 0.82 mg/dL (ref 0.76–1.27)
GFR calc Af Amer: 99 mL/min/{1.73_m2} (ref >59)
GFR calc non Af Amer: 85 mL/min/{1.73_m2} (ref >59)
Glucose: 126 mg/dL — ABNORMAL HIGH (ref 65–99)
Potassium: 4.9 mmol/L (ref 3.5–5.2)
Sodium: 132 mmol/L — ABNORMAL LOW (ref 134–144)

## 2020-05-29 LAB — BRAIN NATRIURETIC PEPTIDE: BNP: 160.5 pg/mL — ABNORMAL HIGH (ref 0.0–100.0)

## 2020-05-30 ENCOUNTER — Other Ambulatory Visit: Payer: Self-pay

## 2020-05-30 ENCOUNTER — Encounter: Payer: Self-pay | Admitting: Cardiovascular Disease

## 2020-05-30 ENCOUNTER — Ambulatory Visit: Payer: Medicare HMO | Admitting: Cardiovascular Disease

## 2020-05-30 VITALS — BP 116/60 | HR 58 | Ht 65.0 in | Wt 133.0 lb

## 2020-05-30 DIAGNOSIS — I350 Nonrheumatic aortic (valve) stenosis: Secondary | ICD-10-CM

## 2020-05-30 NOTE — Patient Instructions (Addendum)
Call us if you decide to proceed! Valetta Fuller, RN (Dr. Antionette Char nurse) [phone]  I have included the information needed in regards to Transcatheter Aortic Valve Replacement (TAVR) work up to treat your diagnosis of Severe Aortic Stenosis (ICD-10 Code I35.0) to discuss with your insurance.    The testing that would need to be performed to determine if you are a candidate for TAVR are:  Right and Left Heart Catheterization with Coronaries, CPT 93460  CT CORONARY MORPH W/CTA COR W/SCORE W/CA W/CM &/OR WO/CM (TAVR protocol), CPT [phone]  CT ANGIO CHEST AORTA W &/OR WO CONTRAST, CPT [id number]   CT Angio Abd/Pel w/ and/or w/o, CPT 74174  Carotid doppler, CPT [id number]  Physical Therapy Evaluation for new patient surgical consult, CPT [phone]

## 2020-05-30 NOTE — Progress Notes (Signed)
[facility]   MULTIDISCIPLINARY HEART VALVE TEAM  Date:  05/30/2020   ID:  Neil [name], DOB 08/13/1941, MRN [id number]  PCP:  Neil Contras, MD   Chief Complaint  Patient presents with  . Aortic Stenosis     HISTORY OF PRESENT ILLNESS: Neil [name] is a 79 y.o. male who presents for evaluation of severe aortic stenosis, referred by Dr Neil [name].  He is here with his wife today. He is a retired Company secretary. He worked for [employer] after he retired from [employer] and did a lot of walking with that job, but retired from that job in 2019 because of struggles with anxiety and depression. He hasn't been very active over the past 1-2 years.  He's known of a heart murmur since age 36. He has no hx of rheumatic fever. He was apparently noted to have aortic stenosis several years ago on an echo done in the [place] office but those records are not currently available. The patient was recently noted to have a change in the characteristics of his murmur by his PCP, Dr Neil [name], and he was then referred for an echo and evaluation by Dr Neil [name].   He complains of marked fatigue, states that he feels 'very tired' all of the time and doesn't do much walking anymore. He otherwise is completely asymptomatic from a cardiac perspective. Today, he denies symptoms of palpitations, chest pain, shortness of breath, orthopnea, PND, lower extremity edema, dizziness, or syncope. He does have problems with his balance and with generalized weakness.  The patient has not had regular dental care and reports no problems with his teeth or gums. He has had no major surgeries.   Past Medical History:  Diagnosis Date  . Anxiety   . Depression   . Dyslipidemia   . Murmur, cardiac     Current Outpatient Medications  Medication Sig Dispense Refill  . [name] 12 MG TABS TAKE 1 TABLET BY MOUTH TWICE A DAY 60 tablet 1  . docusate sodium (COLACE) 100 MG capsule Take 1 capsule (100 mg total) by mouth every 12 (twelve)  hours. 60 capsule 0  . hydrOXYzine (ATARAX/VISTARIL) 25 MG tablet TAKE 1 TABLET BY MOUTH AT BEDTIME AS NEEDED 30 tablet 1  . LORazepam (ATIVAN) 0.5 MG tablet TAKE 1 TABLET (0.5 MG TOTAL) BY MOUTH 4 (FOUR) TIMES DAILY. 120 tablet 1  . Multiple Vitamin (MULTIVITAMIN WITH MINERALS) TABS tablet Take 1 tablet by mouth daily.    . sertraline (ZOLOFT) 50 MG tablet TAKE 1 AND 1/2 TABLETS BY MOUTH DAILY 135 tablet 0   No current facility-administered medications for this visit.    ALLERGIES:   Aspirin, Sulfur, and Tetanus toxoids   SOCIAL HISTORY:  The patient  reports that he has never smoked. He has never used smokeless tobacco. He reports that he does not drink alcohol.   FAMILY HISTORY:  The patient's family history includes Heart attack in his father; Hypertension in his brother, mother, and sister.   REVIEW OF SYSTEMS:  Positive for tardive dyskinesia, balance problems, weakness.   All other systems are reviewed and negative.   PHYSICAL EXAM: VS:  BP 116/60   Pulse (!) 58   Ht 5\' 5"  (1.651 m)   Wt 133 lb (60.3 kg)   SpO2 98%   BMI 22.13 kg/m  , BMI Body mass index is 22.13 kg/m. GEN: Well nourished, well developed, in no acute distress HEENT: normal except for the presence of tardive dyskinesia, dentition appears good.  Neck: No JVD. carotids delayed, BL bruits Cardiac: The heart is RRR with a 3/6 harsh late peaking systolic murmur at the RUSB, no diastolic murmur, absent A2.Marland Kitchen No edema. Pedal pulses 2+ = bilaterally  Respiratory:  clear to auscultation bilaterally GI: soft, nontender, nondistended, + BS MS: no deformity or atrophy Skin: warm and dry, no rash Neuro:  Strength and sensation are intact Psych: euthymic mood, full affect  EKG:  EKG from 05/03/2020 reviewed and demonstrates NSR 59 bpm, nonspecific ST abnormality  RECENT LABS: 05/28/2020: BNP 160.5; BUN 18; Creatinine, Ser 0.82; Potassium 4.9; Sodium 132  No results found for requested labs within last 8760 hours.    Estimated Creatinine Clearance: 64.3 mL/min (by C-G formula based on SCr of 0.82 mg/dL).   Wt Readings from Last 3 Encounters:  05/30/20 133 lb (60.3 kg)  05/03/20 134 lb 9.6 oz (61.1 kg)  07/02/17 139 lb (63 kg)     CARDIAC STUDIES:  Echo:  IMPRESSIONS    1. Very severe aortic stenosis is present (V max 5.45 m/s, MG 77 mmHG,  AVA 0.59 cm2, DI 0.19). The aortic valve is tricuspid. Aortic valve  regurgitation is trivial. Severe aortic valve stenosis. Aortic valve area,  by VTI measures 0.59 cm. Aortic valve  mean gradient measures 77.0 mmHg. Aortic valve Vmax measures 5.45 m/s.  2. Left ventricular ejection fraction, by estimation, is 60 to 65%. The  left ventricle has normal function. The left ventricle has no regional  wall motion abnormalities. There is mild concentric left ventricular  hypertrophy. Left ventricular diastolic  function could not be evaluated.  3. Right ventricular systolic function is normal. The right ventricular  size is normal. Tricuspid regurgitation signal is inadequate for assessing  PA pressure.  4. The mitral valve is grossly normal. Trivial mitral valve  regurgitation. No evidence of mitral stenosis.  5. The inferior vena cava is normal in size with greater than 50%  respiratory variability, suggesting right atrial pressure of 3 mmHg.   Conclusion(s)/Recommendation(s): Patient already being evaluated for TAVR.   FINDINGS  Left Ventricle: Left ventricular ejection fraction, by estimation, is 60  to 65%. The left ventricle has normal function. The left ventricle has no  regional wall motion abnormalities. The left ventricular internal cavity  size was normal in size. There is  mild concentric left ventricular hypertrophy. Left ventricular diastolic  function could not be evaluated due to mitral annular calcification  (moderate or greater). Left ventricular diastolic function could not be  evaluated.   Right Ventricle: The right  ventricular size is normal. No increase in  right ventricular wall thickness. Right ventricular systolic function is  normal. Tricuspid regurgitation signal is inadequate for assessing PA  pressure.   Left Atrium: Left atrial size was normal in size.   Right Atrium: Right atrial size was normal in size.   Pericardium: Trivial pericardial effusion is present.   Mitral Valve: The mitral valve is grossly normal. Mild to moderate mitral  annular calcification. Trivial mitral valve regurgitation. No evidence of  mitral valve stenosis.   Tricuspid Valve: The tricuspid valve is grossly normal. Tricuspid valve  regurgitation is trivial. No evidence of tricuspid stenosis.   Aortic Valve: Very severe aortic stenosis is present (V max 5.45 m/s, MG  77 mmHG, AVA 0.59 cm2, DI 0.19). The aortic valve is tricuspid. . There is  severe thickening and severe calcifcation of the aortic valve. Aortic  valve regurgitation is trivial.  Severe aortic stenosis is present. There is severe  thickening of the  aortic valve. There is severe calcifcation of the aortic valve. Aortic  valve mean gradient measures 77.0 mmHg. Aortic valve peak gradient  measures 118.8 mmHg. Aortic valve area, by VTI  measures 0.59 cm.   Pulmonic Valve: The pulmonic valve was grossly normal. Pulmonic valve  regurgitation is not visualized. No evidence of pulmonic stenosis.   Aorta: The aortic root and ascending aorta are structurally normal, with  no evidence of dilitation.   Venous: The inferior vena cava is normal in size with greater than 50%  respiratory variability, suggesting right atrial pressure of 3 mmHg.   IAS/Shunts: The atrial septum is grossly normal.     LEFT VENTRICLE  PLAX 2D  LVIDd:     4.30 cm Diastology  LVIDs:     2.80 cm LV e' lateral:  5.22 cm/s  LV PW:     1.20 cm LV E/e' lateral: 19.5  LV IVS:    1.30 cm LV e' medial:  5.11 cm/s  LVOT diam:   2.00 cm LV E/e' medial:  20.0  LV SV:     78  LV SV Index:  47  LVOT Area:   3.14 cm     RIGHT VENTRICLE  RV Basal diam: 2.60 cm  RV S prime:   25.45 cm/s  TAPSE (M-mode): 2.3 cm   LEFT ATRIUM       Index    RIGHT ATRIUM      Index  LA diam:    3.90 cm 2.33 cm/m RA Area:   11.20 cm  LA Vol (A2C):  57.3 ml 34.28 ml/m RA Volume:  26.30 ml 15.73 ml/m  LA Vol (A4C):  38.8 ml 23.21 ml/m  LA Biplane Vol: 48.8 ml 29.19 ml/m  AORTIC VALVE  AV Area (Vmax):  0.49 cm  AV Area (Vmean):  0.40 cm  AV Area (VTI):   0.59 cm  AV Vmax:      545.00 cm/s  AV Vmean:     462.000 cm/s  AV VTI:      1.330 m  AV Peak Grad:   118.8 mmHg  AV Mean Grad:   77.0 mmHg  LVOT Vmax:     84.85 cm/s  LVOT Vmean:    58.250 cm/s  LVOT VTI:     0.248 m  LVOT/AV VTI ratio: 0.19    AORTA  Ao Root diam: 2.80 cm  Ao Asc diam: 2.70 cm   MITRAL VALVE  MV Area (PHT): 2.07 cm   SHUNTS  MV Decel Time: 366 msec   Systemic VTI: 0.25 m  MV E velocity: 102.00 cm/s Systemic Diam: 2.00 cm  MV A velocity: 144.00 cm/s  MV E/A ratio: 0.71    STS RISK CALCULATOR: Isolated AVR: Risk of Mortality: 1.510% Renal Failure: 1.044% Permanent Stroke: 1.507% Prolonged Ventilation: 4.126% DSW Infection: 0.062% Reoperation: 4.716% Morbidity or Mortality: 8.244% Short Length of Stay: 46.458% Long Length of Stay: 4.056%  ASSESSMENT AND PLAN: This patient with minimal abdomen in the pulmonary arteries, stage C1 aortic stenosis no clear associated symptoms.  However, he has not been active over the last 1 to 2 years.  He has very severe aortic stenosis with a mean gradient of 77 mmHg and calculated valve area 0.6 cm.  I have personally reviewed his echo images which demonstrate vigorous LV systolic function with a severely calcified aortic valve with restricted leaflet mobility.  Dimensionless index is 0.19.  All of the data confirms very severe aortic  stenosis.  I have reviewed the natural history of aortic stenosis with the patient and their family members who are present today. We have discussed the limitations of medical therapy and the poor prognosis associated with symptomatic aortic stenosis. We have reviewed potential treatment options, including palliative medical therapy, conventional surgical aortic valve replacement, and transcatheter aortic valve replacement. We discussed treatment options in the context of the patient's specific comorbid medical conditions.  The patient's primary comorbidity is what appears to be significant anxiety and depression. I suspect TAVR would be the preferred treatment option if he decides to proceed with treatment of his aortic stenosis.   I reviewed the worse outcomes that are present in patients have this degree of aortic stenosis.  Very severe aortic stenosis with a peak velocity greater than 5 m/s and mean gradient greater than 60 mmHg is an indication for intervention because of the poor prognosis with medical therapy.  The patient is somewhat reluctant to proceed at this time.  He would like to think things over and discuss further with his family.  He understands that next steps would involve a right and left heart catheterization as well as CTA studies of the heart and the chest, abdomen, and pelvis. I have reviewed the risks, indications, and alternatives to cardiac catheterization, possible angioplasty, and stenting with the patient. Risks include but are not limited to bleeding, infection, vascular injury, stroke, myocardial infection, arrhythmia, kidney injury, radiation-related injury in the case of prolonged fluoroscopy use, emergency cardiac surgery, and death. The patient understands the risks of serious complication is 1-2 in 7654 with diagnostic cardiac cath and 1-2% or less with angioplasty/stenting.  As above, I have recommended moving forward with further diagnostic testing, but if he declines this  I would at least like to see him back in 6 months with a follow-up echocardiogram.  He will be in touch with [place] to let us know how he decides to proceed.  Deatra Arshdeep 05/30/2020 10:14 AM     [facility] [address] [place] [address]  [phone] (office) [phone] (fax)

## 2020-05-30 NOTE — H&P (View-Only) (Signed)
[facility]   MULTIDISCIPLINARY HEART VALVE TEAM  Date:  05/30/2020   ID:  LYMON KIDNEY, DOB Jan 16, 1941, MRN [id number]  PCP:  Antony Contras, MD   Chief Complaint  Patient presents with   Aortic Stenosis     HISTORY OF PRESENT ILLNESS: Neil [name] is a 79 y.o. 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  He is here with his wife today. He is a retired Company secretary. He worked for [employer] after he retired from [employer] and did a lot of walking with that job, but retired from that job in 2019 because of struggles with anxiety and depression. He hasn't been very active over the past 1-2 years.  He's known of a heart murmur since age 41. He has no hx of rheumatic fever. He was apparently noted to have aortic stenosis several years ago on an echo done in the [place] office but those records are not currently available. The patient was recently noted to have a change in the characteristics of his murmur by his PCP, Dr Moreen Fowler, and he was then referred for an echo and evaluation by Dr Debara Pickett.   He complains of marked fatigue, states that he feels 'very tired' all of the time and doesn't do much walking anymore. He otherwise is completely asymptomatic from a cardiac perspective. Today, he denies symptoms of palpitations, chest pain, shortness of breath, orthopnea, PND, lower extremity edema, dizziness, or syncope. He does have problems with his balance and with generalized weakness.  The patient has not had regular dental care and reports no problems with his teeth or gums. He has had no major surgeries.   Past Medical History:  Diagnosis Date   Anxiety    Depression    Dyslipidemia    Murmur, cardiac     Current Outpatient Medications  Medication Sig Dispense Refill   [name] 12 MG TABS TAKE 1 TABLET BY MOUTH TWICE A DAY 60 tablet 1   docusate sodium (COLACE) 100 MG capsule Take 1 capsule (100 mg total) by mouth every 12 (twelve)  hours. 60 capsule 0   hydrOXYzine (ATARAX/VISTARIL) 25 MG tablet TAKE 1 TABLET BY MOUTH AT BEDTIME AS NEEDED 30 tablet 1   LORazepam (ATIVAN) 0.5 MG tablet TAKE 1 TABLET (0.5 MG TOTAL) BY MOUTH 4 (FOUR) TIMES DAILY. 120 tablet 1   Multiple Vitamin (MULTIVITAMIN WITH MINERALS) TABS tablet Take 1 tablet by mouth daily.     sertraline (ZOLOFT) 50 MG tablet TAKE 1 AND 1/2 TABLETS BY MOUTH DAILY 135 tablet 0   No current facility-administered medications for this visit.    ALLERGIES:   Aspirin, Sulfur, and Tetanus toxoids   SOCIAL HISTORY:  The patient  reports that he has never smoked. He has never used smokeless tobacco. He reports that he does not drink alcohol.   FAMILY HISTORY:  The patient's family history includes Heart attack in his father; Hypertension in his brother, mother, and sister.   REVIEW OF SYSTEMS:  Positive for tardive dyskinesia, balance problems, weakness.   All other systems are reviewed and negative.   PHYSICAL EXAM: VS:  BP 116/60    Pulse (!) 58    Ht 5\' 5"  (1.651 m)    Wt 133 lb (60.3 kg)    SpO2 98%    BMI 22.13 kg/m  , BMI Body mass index is 22.13 kg/m. GEN: Well nourished, well developed, in no acute distress HEENT: normal except for the presence of  tardive dyskinesia, dentition appears good. Neck: No JVD. carotids delayed, BL bruits Cardiac: The heart is RRR with a 3/6 harsh late peaking systolic murmur at the RUSB, no diastolic murmur, absent A2.Marland Kitchen No edema. Pedal pulses 2+ = bilaterally  Respiratory:  clear to auscultation bilaterally GI: soft, nontender, nondistended, + BS MS: no deformity or atrophy Skin: warm and dry, no rash Neuro:  Strength and sensation are intact Psych: euthymic mood, full affect  EKG:  EKG from 05/03/2020 reviewed and demonstrates NSR 59 bpm, nonspecific ST abnormality  RECENT LABS: 05/28/2020: BNP 160.5; BUN 18; Creatinine, Ser 0.82; Potassium 4.9; Sodium 132  No results found for requested labs within last 8760 hours.    Estimated Creatinine Clearance: 64.3 mL/min (by C-G formula based on SCr of 0.82 mg/dL).   Wt Readings from Last 3 Encounters:  05/30/20 133 lb (60.3 kg)  05/03/20 134 lb 9.6 oz (61.1 kg)  07/02/17 139 lb (63 kg)     CARDIAC STUDIES:  Echo:  IMPRESSIONS    1. Very severe aortic stenosis is present (V max 5.45 m/s, MG 77 mmHG,  AVA 0.59 cm2, DI 0.19). The aortic valve is tricuspid. Aortic valve  regurgitation is trivial. Severe aortic valve stenosis. Aortic valve area,  by VTI measures 0.59 cm. Aortic valve  mean gradient measures 77.0 mmHg. Aortic valve Vmax measures 5.45 m/s.  2. Left ventricular ejection fraction, by estimation, is 60 to 65%. The  left ventricle has normal function. The left ventricle has no regional  wall motion abnormalities. There is mild concentric left ventricular  hypertrophy. Left ventricular diastolic  function could not be evaluated.  3. Right ventricular systolic function is normal. The right ventricular  size is normal. Tricuspid regurgitation signal is inadequate for assessing  PA pressure.  4. The mitral valve is grossly normal. Trivial mitral valve  regurgitation. No evidence of mitral stenosis.  5. The inferior vena cava is normal in size with greater than 50%  respiratory variability, suggesting right atrial pressure of 3 mmHg.   Conclusion(s)/Recommendation(s): Patient already being evaluated for TAVR.   FINDINGS  Left Ventricle: Left ventricular ejection fraction, by estimation, is 60  to 65%. The left ventricle has normal function. The left ventricle has no  regional wall motion abnormalities. The left ventricular internal cavity  size was normal in size. There is  mild concentric left ventricular hypertrophy. Left ventricular diastolic  function could not be evaluated due to mitral annular calcification  (moderate or greater). Left ventricular diastolic function could not be  evaluated.   Right Ventricle: The right  ventricular size is normal. No increase in  right ventricular wall thickness. Right ventricular systolic function is  normal. Tricuspid regurgitation signal is inadequate for assessing PA  pressure.   Left Atrium: Left atrial size was normal in size.   Right Atrium: Right atrial size was normal in size.   Pericardium: Trivial pericardial effusion is present.   Mitral Valve: The mitral valve is grossly normal. Mild to moderate mitral  annular calcification. Trivial mitral valve regurgitation. No evidence of  mitral valve stenosis.   Tricuspid Valve: The tricuspid valve is grossly normal. Tricuspid valve  regurgitation is trivial. No evidence of tricuspid stenosis.   Aortic Valve: Very severe aortic stenosis is present (V max 5.45 m/s, MG  77 mmHG, AVA 0.59 cm2, DI 0.19). The aortic valve is tricuspid. . There is  severe thickening and severe calcifcation of the aortic valve. Aortic  valve regurgitation is trivial.  Severe aortic stenosis  is present. There is severe thickening of the  aortic valve. There is severe calcifcation of the aortic valve. Aortic  valve mean gradient measures 77.0 mmHg. Aortic valve peak gradient  measures 118.8 mmHg. Aortic valve area, by VTI  measures 0.59 cm.   Pulmonic Valve: The pulmonic valve was grossly normal. Pulmonic valve  regurgitation is not visualized. No evidence of pulmonic stenosis.   Aorta: The aortic root and ascending aorta are structurally normal, with  no evidence of dilitation.   Venous: The inferior vena cava is normal in size with greater than 50%  respiratory variability, suggesting right atrial pressure of 3 mmHg.   IAS/Shunts: The atrial septum is grossly normal.     LEFT VENTRICLE  PLAX 2D  LVIDd:     4.30 cm Diastology  LVIDs:     2.80 cm LV e' lateral:  5.22 cm/s  LV PW:     1.20 cm LV E/e' lateral: 19.5  LV IVS:    1.30 cm LV e' medial:  5.11 cm/s  LVOT diam:   2.00 cm LV E/e' medial:  20.0  LV SV:     78  LV SV Index:  47  LVOT Area:   3.14 cm     RIGHT VENTRICLE  RV Basal diam: 2.60 cm  RV S prime:   25.45 cm/s  TAPSE (M-mode): 2.3 cm   LEFT ATRIUM       Index    RIGHT ATRIUM      Index  LA diam:    3.90 cm 2.33 cm/m RA Area:   11.20 cm  LA Vol (A2C):  57.3 ml 34.28 ml/m RA Volume:  26.30 ml 15.73 ml/m  LA Vol (A4C):  38.8 ml 23.21 ml/m  LA Biplane Vol: 48.8 ml 29.19 ml/m  AORTIC VALVE  AV Area (Vmax):  0.49 cm  AV Area (Vmean):  0.40 cm  AV Area (VTI):   0.59 cm  AV Vmax:      545.00 cm/s  AV Vmean:     462.000 cm/s  AV VTI:      1.330 m  AV Peak Grad:   118.8 mmHg  AV Mean Grad:   77.0 mmHg  LVOT Vmax:     84.85 cm/s  LVOT Vmean:    58.250 cm/s  LVOT VTI:     0.248 m  LVOT/AV VTI ratio: 0.19    AORTA  Ao Root diam: 2.80 cm  Ao Asc diam: 2.70 cm   MITRAL VALVE  MV Area (PHT): 2.07 cm   SHUNTS  MV Decel Time: 366 msec   Systemic VTI: 0.25 m  MV E velocity: 102.00 cm/s Systemic Diam: 2.00 cm  MV A velocity: 144.00 cm/s  MV E/A ratio: 0.71    STS RISK CALCULATOR: Isolated AVR: Risk of Mortality: 1.510% Renal Failure: 1.044% Permanent Stroke: 1.507% Prolonged Ventilation: 4.126% DSW Infection: 0.062% Reoperation: 4.716% Morbidity or Mortality: 8.244% Short Length of Stay: 46.458% Long Length of Stay: 4.056%  ASSESSMENT AND PLAN: This patient with minimal abdomen in the pulmonary arteries, stage C1 aortic stenosis no clear associated symptoms.  However, he has not been active over the last 1 to 2 years.  He has very severe aortic stenosis with a mean gradient of 77 mmHg and calculated valve area 0.6 cm.  I have personally reviewed his echo images which demonstrate vigorous LV systolic function with a severely calcified aortic valve with restricted leaflet mobility.  Dimensionless index is 0.19.  All of the data confirms very  severe aortic  stenosis.  I have reviewed the natural history of aortic stenosis with the patient and their family members who are present today. We have discussed the limitations of medical therapy and the poor prognosis associated with symptomatic aortic stenosis. We have reviewed potential treatment options, including palliative medical therapy, conventional surgical aortic valve replacement, and transcatheter aortic valve replacement. We discussed treatment options in the context of the patient's specific comorbid medical conditions.  The patient's primary comorbidity is what appears to be significant anxiety and depression. I suspect TAVR would be the preferred treatment option if he decides to proceed with treatment of his aortic stenosis.   I reviewed the worse outcomes that are present in patients have this degree of aortic stenosis.  Very severe aortic stenosis with a peak velocity greater than 5 m/s and mean gradient greater than 60 mmHg is an indication for intervention because of the poor prognosis with medical therapy.  The patient is somewhat reluctant to proceed at this time.  He would like to think things over and discuss further with his family.  He understands that next steps would involve a right and left heart catheterization as well as CTA studies of the heart and the chest, abdomen, and pelvis. I have reviewed the risks, indications, and alternatives to cardiac catheterization, possible angioplasty, and stenting with the patient. Risks include but are not limited to bleeding, infection, vascular injury, stroke, myocardial infection, arrhythmia, kidney injury, radiation-related injury in the case of prolonged fluoroscopy use, emergency cardiac surgery, and death. The patient understands the risks of serious complication is 1-2 in 3903 with diagnostic cardiac cath and 1-2% or less with angioplasty/stenting.  As above, I have recommended moving forward with further diagnostic testing, but if he declines this  I would at least like to see him back in 6 months with a follow-up echocardiogram.  He will be in touch with [place] to let us know how he decides to proceed.  Deatra Nnaemeka 05/30/2020 10:14 AM     [facility] [address] [place] [address]  [phone] (office) [phone] (fax)

## 2020-05-31 ENCOUNTER — Telehealth: Payer: Self-pay | Admitting: Cardiovascular Disease

## 2020-05-31 NOTE — Telephone Encounter (Signed)
Medical records requested from [facility]. 05/31/20 vlm

## 2020-06-03 ENCOUNTER — Telehealth: Payer: Self-pay | Admitting: Psychiatry

## 2020-06-03 NOTE — Telephone Encounter (Signed)
Pharmacist was filling it already as I called.

## 2020-06-03 NOTE — Telephone Encounter (Signed)
Should already have 1 refill on file at pharmacy. Will contact pharmacy to confirm.

## 2020-06-03 NOTE — Telephone Encounter (Signed)
Pt would like a refill on Lorazepam. Please send to [facility] on [address].

## 2020-06-04 ENCOUNTER — Encounter: Payer: Self-pay | Admitting: Psychiatry

## 2020-06-04 ENCOUNTER — Other Ambulatory Visit: Payer: Self-pay

## 2020-06-04 ENCOUNTER — Ambulatory Visit (INDEPENDENT_AMBULATORY_CARE_PROVIDER_SITE_OTHER): Payer: Medicare HMO | Admitting: Psychiatry

## 2020-06-04 ENCOUNTER — Other Ambulatory Visit: Payer: Self-pay | Admitting: Psychiatry

## 2020-06-04 DIAGNOSIS — F411 Generalized anxiety disorder: Secondary | ICD-10-CM

## 2020-06-04 DIAGNOSIS — F061 Catatonic disorder due to known physiological condition: Secondary | ICD-10-CM | POA: Diagnosis not present

## 2020-06-04 DIAGNOSIS — G2401 Drug induced subacute dyskinesia: Secondary | ICD-10-CM | POA: Diagnosis not present

## 2020-06-04 DIAGNOSIS — F251 Schizoaffective disorder, depressive type: Secondary | ICD-10-CM

## 2020-06-04 DIAGNOSIS — F5105 Insomnia due to other mental disorder: Secondary | ICD-10-CM | POA: Diagnosis not present

## 2020-06-04 DIAGNOSIS — R69 Illness, unspecified: Secondary | ICD-10-CM | POA: Diagnosis not present

## 2020-06-04 NOTE — Progress Notes (Signed)
Neil [name] [id number] 24-Mar-1941 79 y.o.     Subjective:   Patient ID:  Neil [name] is a 79 y.o. (DOB October 14, 1941) male.  Chief Complaint:  Chief Complaint  Patient presents with  . Follow-up  . Sleeping Problem  . Anxiety  . tardive dyskinesia    Medication Refill Pertinent negatives include no weakness.  Anxiety Symptoms include decreased concentration and nervous/anxious behavior. Patient reports no confusion or dizziness.    Depression        Associated symptoms include decreased concentration.  Associated symptoms include no appetite change.  Past medical history includes anxiety.     Neil [name] presents today for follow-up of psychosis, depression, and anxiety.  He has required an urgent appointment because of wanting further med changes and frequent phone calls with med changes since he was last here  When seen September 3 he was switched off olanzapine because of EPS and switched on to Saphris which was raised to 5 mg nightly but only took that dose 1 time.  The rest of the time he took only 2.5 mg nightly.   He called back 5 days later stating he wanted to stop it because it was causing him to hold his mouth open and he was having a worsening time with tongue movements and felt "funny".  He was encouraged to give it more time but he would not agree to do so.  He stopped it Friday 9/11 and still felt the funny tense feeling.    When  seen September 01, 2019.  The decision was made to switch from [place] to [place] because of difficulty getting an adequate response and limited side effects from [place].  We also discussed the antipsychotic and the plan was to start [place] but those samples were not available so he decided to restart Saphris 2.5 mg nightly.  At the visit September 15, 2011 [name] was increased to 12 mg twice daily, Saphris was discontinued and Caplyta 42 mg daily was started because of his low EPS risk.  He still needed an antipsychotic.  He  continued on sertraline 75 mg daily because he felt it helped his depression.  Wife called 10/8 and said the following: On Caplyta,  Thinks it is too strong.  His movements are slow, sleeping too much, drowsy during day ( doesn't really wake up until 4pm after sleeping all night). Takes him along time to think and respond. Had trouble with mouth and jaw before, and now he is holding his mouth open all the time.  Paranoia is better though. Was told to take it 3 days weekly.    October 16 [name] increased to 18 mg BID  Called again November 9 stating he couldn't tolerate either med.  He stopped the Caplyta and reduced the [name] to 9 mg BID a few days ago.  CO rigidity in lower back and arms.  Still bothered by constant tongue movements.  No change in worry, mood but paranoia is reduced since Caplyta.   visit October 25, 2019.  He had stopped Caplyta.  He had also reduced [name] to 9 mg twice daily on his own.  He has been making frequent med changes [place] which is made it extremely difficult to get an antipsychotic to deal with his psychotic symptoms as well as adjust the [name] today with tardive dyskinesia symptoms.  He is very impatient with med changes.  Therefore we did not start a new antipsychotic at the last visit.  He  tends to make frequent phone calls and did call again on November 23 complaining of feeling very restless and difficulty sleeping.  Was prescribed clonazepam 0.5 mg twice daily.  He called back the next day stating he wanted to stop the [name] due to restlessness but he had never taken the prescribed clonazepam.  Using courage to follow the treatment plan and take the clonazepam.  He called again the next day stating he still wanted to stop [name] and the clonazepam was making his gait unsteady.  He was informed that if he stopped the [name] his mouth movements would get worse which is been a leading complaint but he could stop it if he wanted  to.  December 4 he reports clonazepam has helped the anxiety and less guilt and less rigidity.  Taking TID and only SE is a little slower movements.  1 fall, slipped on Ba floor where water was lying.  No injury.   Gained 3# and better appetite for the first time in a long time.  CO tongue movements.  Restlessness is now bearable.  Less anxiety overall.  No AH.    visit December 4.  His psychosis was not severe and the decision was made not to start another antipsychotic but instead to try to optimize treatment for tardive dyskinesia and [name] was increased to 12 mg twice daily. He called back immediately after the head visit admitting he had given false information during the visit and that he not actually been taking the [name] in the way he told me during the visit making it necessary to change the [name] to 9 mg twice daily.  He was confronted about his noncompliance and warned that he may be discharged from the practice if he continued to be misleading and noncompliant with the physician.  This is not only compromising care but it is increasing his own risk because he is giving false information.   visit December 21, 2019.  Because of his complaints about balance issues clonazepam was reduced and spread out to 0.25 mg 4 times daily.  Also for tardive dyskinesia symptoms [name] was increased to 12 mg twice daily.  Prior to that visit he admitted being generally noncompliant with the [name] making it difficult to judge what dose to prescribe.  He was continued on sertraline 75 mg daily as he felt that was helpful for his anxiety. Says he's compliant with med changes last visit.  Tongue movements are better but not gone. No problems with the [name].  Stiffness comes and goes and located in head and eyes.  Denies it's vertigo.  Intensity is less.  If shuts his eyes it goes right away.  visit January 04, 2020.  [name] remained 12 mg twice daily and sertraline 75 mg daily.  The following was  changed: switch to lorazepam bc tiredness and balance problems to Ativan. Yes DC clonazepam and start lorazepam 0.5 mg 4 times daily  seen February 8 the patient reports the following.  Yes it did help.  Not as slowed and balance is good now.  Still feels tired with tendency to want to lay down and knows he needs to work on this.  Anxiety is pretty fair.  We decided to reduce the lorazepam to 0.5 mg 3 times daily in hopes of improving energy and balance further.  No other meds were changed.  He is still not on an antipsychotic.  Anxiety comes and goes but sometimes able to get by with TID lorazepam and energy is  better.  Wife says sometimes gets uptight and can't relax so needs QID.Pt reports that mood is anxiety aand not much depression and describes anxiety as worry. Anxiety symptoms include: worry not panic and moderate.   Sleep is better usually with hydroxyzine and sometimes lorazepam. Pt reports that appetite is Much better with weight up to 131# as low as 120#.  Normal was 140#.Marland Kitchen Pt reports that energy is better and OK. Concentration is fair.. Suicidal thoughts:  None. Denies sig paranoid thought.  Plan: No med change except okay to use hydroxyzine 25 mg nightly for insomnia.  Per usual the patient tends to make multiple phone calls between appointments.  He is called about 7 times since March.  06/04/2020 appointment with the following noted: Seen with his wife. Reports hydroxyzine 25 mg is not helpful for sleep. Still moving tongue. No SE meds currently. Not depressed.  Is anxious persistent in the back ground and not even connected to thoughts. Little intrusive thougts.   Sleep concerns, to bed 830 an not sure when goes to sleep.  Sometimes can't go to sleep. Get OOB about 9 AM.  Usually goes  back to bed.  CO weakness for 18 mos. Needs Aortic valve replacement. He tried reducing lorazepam to 0.5 mg TID and had a lot of restlessness but some improvement in energy.  Wonders if fatigue  related to meds or heart valve problem.  Probably will have heart surgery.  No history of suicide attempts.  [name] living with them.  Become more committed [place].  He has schizophrenia.   Past Psychiatric Medication Trials: Mirtazapine,  olanzapine 5 EPS, perphenazine started TD with mouth px,  Saphris 5 mg and stopped due to EPS complaints and feeling "funny" Caplyta side effects risperidone 2 tremors  Buspirone,  amitriptyline 25,   Hydroxyzine 10-20 Clonazepam 0.5 TID dizzy and sleepy Ativan 0.5 mg QID [place] and  [name] 12 mg BID  Review of Systems:  Review of Systems  Constitutional: Negative for appetite change.  Gastrointestinal: Negative for constipation.  Neurological: Negative for dizziness, tremors, syncope and weakness.       No falls But complain of poor balance  Psychiatric/Behavioral: Positive for decreased concentration, depression and dysphoric mood. Negative for agitation, behavioral problems, confusion, hallucinations, self-injury and sleep disturbance. The patient is nervous/anxious. The patient is not hyperactive.     Medications: I have reviewed the patient's current medications.  Current Outpatient Medications  Medication Sig Dispense Refill  . [name] 12 MG TABS TAKE 1 TABLET BY MOUTH TWICE A DAY 60 tablet 1  . docusate sodium (COLACE) 100 MG capsule Take 1 capsule (100 mg total) by mouth every 12 (twelve) hours. 60 capsule 0  . hydrOXYzine (ATARAX/VISTARIL) 25 MG tablet TAKE 1 TABLET BY MOUTH AT BEDTIME AS NEEDED 30 tablet 1  . LORazepam (ATIVAN) 0.5 MG tablet TAKE 1 TABLET (0.5 MG TOTAL) BY MOUTH 4 (FOUR) TIMES DAILY. 120 tablet 1  . Multiple Vitamin (MULTIVITAMIN WITH MINERALS) TABS tablet Take 1 tablet by mouth daily.    . sertraline (ZOLOFT) 50 MG tablet TAKE 1 AND 1/2 TABLETS BY MOUTH DAILY 135 tablet 0   No current facility-administered medications for this visit.    Medication Side Effects: None  Allergies:  Allergies  Allergen  Reactions  . Aspirin Swelling and Other (See Comments)    Reaction:  Tongue swelling   . Sulfur Other (See Comments)    Reaction:  Unknown   . Tetanus Toxoids Swelling and Other (See Comments)  Reaction:  Arm swelling    Past Medical History:  Diagnosis Date  . Anxiety   . Depression   . Dyslipidemia   . Murmur, cardiac     Family History  Problem Relation Age of Onset  . Hypertension Mother   . Heart attack Father   . Hypertension Sister   . Hypertension Brother     Social History   Socioeconomic History  . Marital status: Married    Spouse name: Not on file  . Number of children: Not on file  . Years of education: Not on file  . Highest education level: Not on file  Occupational History  . Not on file  Tobacco Use  . Smoking status: Never Smoker  . Smokeless tobacco: Never Used  Substance and Sexual Activity  . Alcohol use: No  . Drug use: Not on file  . Sexual activity: Not on file  Other Topics Concern  . Not on file  Social History Narrative  . Not on file   Social Determinants of Health   Financial Resource Strain:   . Difficulty of Paying Living Expenses:   Food Insecurity:   . Worried About Charity fundraiser in the Last Year:   . Arboriculturist in the Last Year:   Transportation Needs:   . Film/video editor (Medical):   Marland Kitchen Lack of Transportation (Non-Medical):   Physical Activity:   . Days of Exercise per Week:   . Minutes of Exercise per Session:   Stress:   . Feeling of Stress :   Social Connections:   . Frequency of Communication with Friends and Family:   . Frequency of Social Gatherings with Friends and Family:   . Attends Religious Services:   . Active Member of Clubs or Organizations:   . Attends Archivist Meetings:   Marland Kitchen Marital Status:   Intimate Partner Violence:   . Fear of Current or Ex-Partner:   . Emotionally Abused:   Marland Kitchen Physically Abused:   . Sexually Abused:     Past Medical History, Surgical  history, Social history, and Family history were reviewed and updated as appropriate.   Please see review of systems for further details on the patient's review from today.   Objective:   Physical Exam:  There were no vitals taken for this visit.  Physical Exam Constitutional:      General: He is not in acute distress. Musculoskeletal:        General: No deformity.  Neurological:     Mental Status: He is alert and oriented to person, place, and time.     Cranial Nerves: No dysarthria.     Coordination: Coordination normal.     Gait: Gait abnormal.     Comments: See AIMS 22 Reduced arm swing. Normal motor tone in arms without cogwheeling.  Psychiatric:        Attention and Perception: Attention and perception normal. He is attentive. He does not perceive auditory or visual hallucinations.        Mood and Affect: Mood is anxious. Mood is not depressed. Affect is not labile, blunt, angry or inappropriate.        Speech: Speech normal.        Behavior: Behavior normal. Behavior is cooperative.        Thought Content: Thought content normal. Thought content is not paranoid or delusional. Thought content does not include homicidal or suicidal ideation. Thought content does not include homicidal or suicidal plan.  Cognition and Memory: Cognition and memory normal.        Judgment: Judgment normal.     Comments: Insight fair.     Lab Review:     Component Value Date/Time   NA 132 (L) 05/28/2020 1424   K 4.9 05/28/2020 1424   CL 92 (L) 05/28/2020 1424   CO2 26 05/28/2020 1424   GLUCOSE 126 (H) 05/28/2020 1424   BUN 18 05/28/2020 1424   CREATININE 0.82 05/28/2020 1424   CALCIUM 9.0 05/28/2020 1424   GFRNONAA 85 05/28/2020 1424   GFRAA 99 05/28/2020 1424    No results found for: WBC, RBC, HGB, HCT, PLT, MCV, MCH, MCHC, RDW, LYMPHSABS, MONOABS, EOSABS, BASOSABS I will  No results found for: POCLITH, LITHIUM   No results found for: PHENYTOIN, PHENOBARB, VALPROATE, [place]     Office Visit from 06/04/2020 in [place] Total Score 22       .res Assessment: Plan:    [place] was seen today for follow-up, sleeping problem, anxiety and tardive dyskinesia.  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 We discussed Patient has history of severe recurrent major depression with psychotic features.  There are also elements of OCD.  The patient has been extremely difficult to treat because of medication sensitivity and frequently changing medications on his own.    This has been discussed with him as compromising his treatment outcome.  He is very impatient with med trials and frequently calls between appointments wanting medicine changes further compromising treatment outcome.  This has been discussed with him and is improved recently.  His paranoid is improved.  General anxiety with some intrusive thoughts and some depression.  His son has schizophrenia so there may be a genetic predisposition to psychosis.  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        He has benefited with reduction in anxiety from the use of sertraline and benzo.  Again his depression is better with the sertraline.  Less shame but that may be mild paranoia or anxiety and not depression. Continue sertraline to 75 daily.  He says he's less depressed and it has helped his anxiety. Consider increase but prefer one med change at a time.  Disc SE in detail and SSRI withdrawal sx.  Consider increase bc still has anhedonia.  For cost reasons and low EPS would  consider use loxapine if needed.  Another option would be quetiapine with low EPS but is fairly sedating not sure he can tolerate it.  Consider Latuda but it has a fair EPS risk as well.  However it might also help his depression allowing for less sertraline.  We will hold off starting any new antipsychotic as long as possible.    Spending too much time in bed.  ExtensiveThis may be contributing to his complaints of back pain.  Try to reduce lorazepam to 3 and 1/2 tablets daily.  to see if energy is better.  Keep the lowest effective dose possible.   Do not exceed 4 Ativan a day.  No new antipsychotics started today. Increase [name] for  1 week to 1 of 6 mg and 1 of the 9 mg tablets twice daily for 1 week, Then increase again to 12 mg tablets 1 and 1/2 tablets twice daily or 18 mg twice daily.  Continue sertraline 75 mg daily for anxiety. He feels this is helpful.  Rec walk outside again and try to increase activity in order to improve mood and his sense of confidence and normalcy.  FU 6 weeks  Lynder Parents, MD, DFAPA     Future Appointments  Date Time Provider [place]  07/08/2020 11:30 AM Hilty, Nadean Corwin, MD [facility]    No orders of the defined types were placed in this encounter.     -------------------------------

## 2020-06-04 NOTE — Patient Instructions (Addendum)
Increase [name] for 1 week to 1 of 6 mg and 1 of the 9 mg tablets twice daily for 1 week, Then increase again to 12 mg tablets 1 and 1/2 tablets twice daily or 18 mg twice daily.  Try to reduce lorazepam to 3 and 1/2 tablets daily for 3 weeks, Then reduce to 1 tablet 3 times daily.

## 2020-06-07 ENCOUNTER — Telehealth: Payer: Self-pay | Admitting: Cardiovascular Disease

## 2020-06-07 NOTE — Telephone Encounter (Signed)
Medical records received from [facility]. Placed in Dr. Antionette Char box for review. 06/07/20 vlm

## 2020-06-10 ENCOUNTER — Telehealth: Payer: Self-pay | Admitting: Cardiovascular Disease

## 2020-06-10 DIAGNOSIS — I350 Nonrheumatic aortic (valve) stenosis: Secondary | ICD-10-CM

## 2020-06-10 NOTE — Telephone Encounter (Signed)
New Message:   Wife would like to talk to [facility] about test pt needs to have before his surgery and some other questions about his surgery.

## 2020-06-10 NOTE — Telephone Encounter (Addendum)
The patient reports he is ready to proceed with TAVR testing and agrees to [facility] on 7/14. Will arrange and call back with instruction. TAVR scans ordered for scheduling as well.   Instruction letter written. Will call and review with patient on Wednesday and complete KCCQ.

## 2020-06-11 ENCOUNTER — Other Ambulatory Visit: Payer: Self-pay | Admitting: Psychiatry

## 2020-06-11 DIAGNOSIS — F333 Major depressive disorder, recurrent, severe with psychotic symptoms: Secondary | ICD-10-CM

## 2020-06-11 DIAGNOSIS — F411 Generalized anxiety disorder: Secondary | ICD-10-CM

## 2020-06-12 ENCOUNTER — Other Ambulatory Visit: Payer: Self-pay | Admitting: Psychiatry

## 2020-06-12 ENCOUNTER — Other Ambulatory Visit: Payer: Self-pay

## 2020-06-12 ENCOUNTER — Telehealth: Payer: Self-pay | Admitting: Psychiatry

## 2020-06-12 DIAGNOSIS — G2401 Drug induced subacute dyskinesia: Secondary | ICD-10-CM

## 2020-06-12 MED ORDER — AUSTEDO 9 MG PO TABS: 18.0000 mg | ORAL_TABLET | Freq: Two times a day (BID) | ORAL | 1 refills | Status: DC

## 2020-06-12 NOTE — Telephone Encounter (Signed)
Pt wife Neil [name] left message for Nurse to return call @ [phone]. Apt last week w/[facility] and need info about samples.

## 2020-06-12 NOTE — Telephone Encounter (Signed)
Reviewed instructions for cath on 7/14 with the patient and his wife. Spoke with Dr. Angelena Form (DOD). The patient is allergic to ASA and will not be instructed to take any.  KCCQ complete.  Updated labs scheduled 7/6. Instruction letter printed and mailed to patient. He will also pick up a copy when he comes to get his blood work drawn.

## 2020-06-12 NOTE — Telephone Encounter (Signed)
Neil [name] is just needed an updated Rx sent to [place] for his [name], dose increased to 18 mg bid. Will send with increase

## 2020-06-12 NOTE — Telephone Encounter (Signed)
Dose change to [name] 9 mg tab 2 tabs BID

## 2020-06-13 ENCOUNTER — Telehealth: Payer: Self-pay | Admitting: Cardiovascular Disease

## 2020-06-13 NOTE — Telephone Encounter (Signed)
Medical records requested from [place] at [place]. 06/13/20 vlm

## 2020-06-18 ENCOUNTER — Telehealth: Payer: Self-pay | Admitting: Cardiovascular Disease

## 2020-06-18 ENCOUNTER — Other Ambulatory Visit: Payer: Self-pay

## 2020-06-18 ENCOUNTER — Other Ambulatory Visit: Payer: Medicare HMO

## 2020-06-18 DIAGNOSIS — I350 Nonrheumatic aortic (valve) stenosis: Secondary | ICD-10-CM

## 2020-06-18 LAB — CBC WITH DIFFERENTIAL/PLATELET
Basophils Absolute: 0 10*3/uL (ref 0.0–0.2)
Basos: 0 %
EOS (ABSOLUTE): 0.4 10*3/uL (ref 0.0–0.4)
Eos: 5 %
Hematocrit: 38.6 % (ref 37.5–51.0)
Hemoglobin: 13.5 g/dL (ref 13.0–17.7)
Lymphocytes Absolute: 1.7 10*3/uL (ref 0.7–3.1)
Lymphs: 19 %
MCH: 31.4 pg (ref 26.6–33.0)
MCHC: 35 g/dL (ref 31.5–35.7)
MCV: 90 fL (ref 79–97)
Monocytes Absolute: 0.6 10*3/uL (ref 0.1–0.9)
Monocytes: 7 %
Neutrophils Absolute: 5.9 10*3/uL (ref 1.4–7.0)
Neutrophils: 69 %
Platelets: 243 10*3/uL (ref 150–450)
RBC: 4.3 x10E6/uL (ref 4.14–5.80)
RDW: 13 % (ref 11.6–15.4)
WBC: 8.7 10*3/uL (ref 3.4–10.8)

## 2020-06-18 LAB — BASIC METABOLIC PANEL
BUN/Creatinine Ratio: 17 (ref 10–24)
BUN: 14 mg/dL (ref 8–27)
CO2: 27 mmol/L (ref 20–29)
Calcium: 9.1 mg/dL (ref 8.6–10.2)
Chloride: 96 mmol/L (ref 96–106)
Creatinine, Ser: 0.82 mg/dL (ref 0.76–1.27)
GFR calc Af Amer: 99 mL/min/{1.73_m2} (ref >59)
GFR calc non Af Amer: 85 mL/min/{1.73_m2} (ref >59)
Glucose: 111 mg/dL — ABNORMAL HIGH (ref 65–99)
Potassium: 4.5 mmol/L (ref 3.5–5.2)
Sodium: 130 mmol/L — ABNORMAL LOW (ref 134–144)

## 2020-06-18 NOTE — Telephone Encounter (Signed)
Spoke with the pt and his wife, Vaughan Basta... she had few precath questions that she needed clarification.... she was asking about a list of "clear" liquids that he could have prior to 5 am the morning of his cath. She was also asking about bringing his meds to the hospital with them.. I advised them that he could bring his meds with him or a detailed list but he cannot take any of his own meds while in our care unless approved by the MD/ nurse while at the hospital. She agreed.   She says she will call back if she develops any further questions. She also wanted Valetta Fuller to know that she received her letter in the mail and was appreciative of her thorough information.   She had asked me to note that they are only consenting to the Heart Cath and no intervention or surgical procedure beyond that unless spoken to first and given the information they need to decide if they would like to proceed.   I advised her that I will forward her message to Dr. Nance Pew per her request. I reassured her that it is our standard to be sure she and the pt are well informed.

## 2020-06-18 NOTE — Telephone Encounter (Signed)
I am going to forward this call our Cath nurse [place] to see if they are able to answer any further questions about cath.

## 2020-06-18 NOTE — Telephone Encounter (Signed)
Pt's wife called and has a lot of questions regarding pt's catherization. Please call number at [phone]

## 2020-06-20 ENCOUNTER — Telehealth: Payer: Self-pay | Admitting: Cardiovascular Disease

## 2020-06-20 NOTE — Telephone Encounter (Signed)
I spoke with patient's wife, questions answered about [facility] 06/26/20, she thanked me for call.

## 2020-06-20 NOTE — Telephone Encounter (Signed)
    Pt's wife calling back she said she has another question about pt's upcoming procedure

## 2020-06-25 ENCOUNTER — Telehealth: Payer: Self-pay | Admitting: Cardiovascular Disease

## 2020-06-25 NOTE — Telephone Encounter (Signed)
Pt contacted pre-catheterization scheduled at [facility] for: Wednesday June 26, 2020 11:30 AM Verified arrival time and place: [facility]) at: 9:30 AM   No solid food after midnight prior to cath, clear liquids until 5 AM day of procedure  AM meds can be  taken pre-cath with sips of water. Pt is allergic to ASA and will not be instructed to take ASA.    Confirmed patient has responsible adult to drive home post procedure and observe 24 hours after arriving home: yes  You are allowed ONE visitor in the waiting room during your procedure. Both you and your visitor must wear a mask once you enter the hospital.      COVID-19 Pre-Screening Questions:  . In the past 7 to 10 days have you had a new cough, shortness of breath, headache, congestion, fever (100 or greater) unexplained body aches, new sore throat, or sudden loss of taste or sense of smell? no . In the past 7 to 10 days have you been around anyone with known Covid 19? no   Reviewed procedure/mask/visitor instructions, COVID-19 questions with patient's wife (DPR), Vaughan Basta.  Pt is fully vaccinated, see immunization history.

## 2020-06-25 NOTE — Telephone Encounter (Signed)
Will send this to our Procedure Nurse Navigator to further follow-up with pts wife, about [facility] scheduled for pt on 7/14.

## 2020-06-25 NOTE — Telephone Encounter (Signed)
Pt's wife is calling because he is having a RIGHT/LEFT HEART CATH AND CORONARY ANGIOGRAPHY done on 06/26/20 and she has some questions that she would like to ask about it and about medications. Please call to discuss.

## 2020-06-26 ENCOUNTER — Ambulatory Visit (HOSPITAL_COMMUNITY)
Admission: RE | Disposition: A | Discharge status: Home / Self Care | Payer: Medicare HMO | Source: Home / Self Care | Attending: Cardiovascular Disease

## 2020-06-26 ENCOUNTER — Other Ambulatory Visit: Payer: Self-pay

## 2020-06-26 ENCOUNTER — Other Ambulatory Visit: Payer: Self-pay | Admitting: Psychiatry

## 2020-06-26 ENCOUNTER — Telehealth: Payer: Self-pay | Admitting: Physician Assistant

## 2020-06-26 ENCOUNTER — Ambulatory Visit (HOSPITAL_COMMUNITY)
Admission: RE | Admit: 2020-06-26 | Discharge: 2020-06-26 | Disposition: A | Discharge status: Home / Self Care | Payer: Medicare HMO | Attending: Cardiovascular Disease | Admitting: Cardiovascular Disease

## 2020-06-26 DIAGNOSIS — I2582 Chronic total occlusion of coronary artery: Secondary | ICD-10-CM | POA: Insufficient documentation

## 2020-06-26 DIAGNOSIS — K5730 Diverticulosis of large intestine without perforation or abscess without bleeding: Secondary | ICD-10-CM | POA: Diagnosis not present

## 2020-06-26 DIAGNOSIS — E785 Hyperlipidemia, unspecified: Secondary | ICD-10-CM | POA: Diagnosis not present

## 2020-06-26 DIAGNOSIS — F329 Major depressive disorder, single episode, unspecified: Secondary | ICD-10-CM | POA: Diagnosis not present

## 2020-06-26 DIAGNOSIS — F419 Anxiety disorder, unspecified: Secondary | ICD-10-CM | POA: Diagnosis not present

## 2020-06-26 DIAGNOSIS — K8020 Calculus of gallbladder without cholecystitis without obstruction: Secondary | ICD-10-CM | POA: Insufficient documentation

## 2020-06-26 DIAGNOSIS — Z886 Allergy status to analgesic agent status: Secondary | ICD-10-CM | POA: Diagnosis not present

## 2020-06-26 DIAGNOSIS — I352 Nonrheumatic aortic (valve) stenosis with insufficiency: Secondary | ICD-10-CM | POA: Diagnosis not present

## 2020-06-26 DIAGNOSIS — I350 Nonrheumatic aortic (valve) stenosis: Secondary | ICD-10-CM

## 2020-06-26 DIAGNOSIS — Z79899 Other long term (current) drug therapy: Secondary | ICD-10-CM | POA: Diagnosis not present

## 2020-06-26 DIAGNOSIS — F5105 Insomnia due to other mental disorder: Secondary | ICD-10-CM

## 2020-06-26 DIAGNOSIS — I700 Atherosclerosis of aorta: Secondary | ICD-10-CM | POA: Insufficient documentation

## 2020-06-26 DIAGNOSIS — I2510 Atherosclerotic heart disease of native coronary artery without angina pectoris: Secondary | ICD-10-CM | POA: Insufficient documentation

## 2020-06-26 DIAGNOSIS — R69 Illness, unspecified: Secondary | ICD-10-CM | POA: Diagnosis not present

## 2020-06-26 HISTORY — PX: RIGHT AND LEFT HEART CATH: CATH118262

## 2020-06-26 LAB — POCT I-STAT EG7
Acid-Base Excess: 2 mmol/L (ref 0.0–2.0)
Acid-Base Excess: 2 mmol/L (ref 0.0–2.0)
Bicarbonate: 28.3 mmol/L — ABNORMAL HIGH (ref 20.0–28.0)
Bicarbonate: 27.8 mmol/L (ref 20.0–28.0)
Calcium, Ion: 1.2 mmol/L (ref 1.15–1.40)
Calcium, Ion: 1.17 mmol/L (ref 1.15–1.40)
HCT: 40 % (ref 39.0–52.0)
HCT: 39 % (ref 39.0–52.0)
Hemoglobin: 13.6 g/dL (ref 13.0–17.0)
Hemoglobin: 13.3 g/dL (ref 13.0–17.0)
O2 Saturation: 81 %
O2 Saturation: 82 %
Potassium: 4.1 mmol/L (ref 3.5–5.1)
Potassium: 4 mmol/L (ref 3.5–5.1)
Sodium: 136 mmol/L (ref 135–145)
Sodium: 136 mmol/L (ref 135–145)
TCO2: 30 mmol/L (ref 22–32)
TCO2: 29 mmol/L (ref 22–32)
pCO2, Ven: 48.4 mmHg (ref 44.0–60.0)
pCO2, Ven: 48.5 mmHg (ref 44.0–60.0)
pH, Ven: 7.375 (ref 7.250–7.430)
pH, Ven: 7.367 (ref 7.250–7.430)
pO2, Ven: 47 mmHg — ABNORMAL HIGH (ref 32.0–45.0)
pO2, Ven: 48 mmHg — ABNORMAL HIGH (ref 32.0–45.0)

## 2020-06-26 SURGERY — RIGHT AND LEFT HEART CATH

## 2020-06-26 MED ORDER — VERAPAMIL HCL 2.5 MG/ML IV SOLN
INTRAVENOUS | Status: DC | PRN
  Administered 2020-06-26: 10 mL via INTRA_ARTERIAL

## 2020-06-26 MED ORDER — FENTANYL CITRATE (PF) 100 MCG/2ML IJ SOLN
INTRAMUSCULAR | Status: AC
  Filled 2020-06-26: qty 2

## 2020-06-26 MED ORDER — HEPARIN SODIUM (PORCINE) 1000 UNIT/ML IJ SOLN
INTRAMUSCULAR | Status: DC | PRN
  Administered 2020-06-26: 3000 [IU] via INTRAVENOUS

## 2020-06-26 MED ORDER — SODIUM CHLORIDE 0.9% FLUSH: 3.0000 mL | INTRAVENOUS | Status: DC | PRN

## 2020-06-26 MED ORDER — NITROGLYCERIN 1 MG/10 ML FOR IR/CATH LAB
INTRA_ARTERIAL | Status: AC
  Filled 2020-06-26: qty 10

## 2020-06-26 MED ORDER — HEPARIN (PORCINE) IN NACL 1000-0.9 UT/500ML-% IV SOLN
INTRAVENOUS | Status: DC | PRN
  Administered 2020-06-26 (×2): 500 mL

## 2020-06-26 MED ORDER — FENTANYL CITRATE (PF) 100 MCG/2ML IJ SOLN
INTRAMUSCULAR | Status: DC | PRN
  Administered 2020-06-26: 25 ug via INTRAVENOUS

## 2020-06-26 MED ORDER — HEPARIN SODIUM (PORCINE) 1000 UNIT/ML IJ SOLN
INTRAMUSCULAR | Status: AC
  Filled 2020-06-26: qty 1

## 2020-06-26 MED ORDER — MIDAZOLAM HCL 2 MG/2ML IJ SOLN
INTRAMUSCULAR | Status: AC
  Filled 2020-06-26: qty 2

## 2020-06-26 MED ORDER — SODIUM CHLORIDE 0.9 % IV SOLN: 250.0000 mL | INTRAVENOUS | Status: DC | PRN

## 2020-06-26 MED ORDER — IOHEXOL 350 MG/ML SOLN
INTRAVENOUS | Status: DC | PRN
  Administered 2020-06-26: 100 mL

## 2020-06-26 MED ORDER — SODIUM CHLORIDE 0.9 % WEIGHT BASED INFUSION: 1.0000 mL/kg/h | INTRAVENOUS | Status: DC

## 2020-06-26 MED ORDER — LIDOCAINE HCL (PF) 1 % IJ SOLN
INTRAMUSCULAR | Status: AC
  Filled 2020-06-26: qty 30

## 2020-06-26 MED ORDER — HEPARIN (PORCINE) IN NACL 1000-0.9 UT/500ML-% IV SOLN
INTRAVENOUS | Status: AC
  Filled 2020-06-26: qty 1000

## 2020-06-26 MED ORDER — SODIUM CHLORIDE 0.9% FLUSH: 3.0000 mL | Freq: Two times a day (BID) | INTRAVENOUS | Status: DC

## 2020-06-26 MED ORDER — MIDAZOLAM HCL 2 MG/2ML IJ SOLN
INTRAMUSCULAR | Status: DC | PRN
  Administered 2020-06-26: 1 mg via INTRAVENOUS

## 2020-06-26 MED ORDER — VERAPAMIL HCL 2.5 MG/ML IV SOLN
INTRAVENOUS | Status: AC
  Filled 2020-06-26: qty 2

## 2020-06-26 MED ORDER — LIDOCAINE HCL (PF) 1 % IJ SOLN
INTRAMUSCULAR | Status: DC | PRN
  Administered 2020-06-26: 5 mL

## 2020-06-26 MED ORDER — SODIUM CHLORIDE 0.9 % WEIGHT BASED INFUSION
3.0000 mL/kg/h | INTRAVENOUS | Status: AC
  Administered 2020-06-26: 3 mL/kg/h via INTRAVENOUS

## 2020-06-26 SURGICAL SUPPLY — 11 items

## 2020-06-26 NOTE — Telephone Encounter (Signed)
Paged by the patient's friend. He went home today and realize there is still a peripheral IV in place on the back of his left hand. I discussed with cath lab personal who called short-stay. Patient will come to short-stay today to have the IV removed.   I spoke with Threasa Beards of Short stay about the case as well.

## 2020-06-26 NOTE — Progress Notes (Signed)
1925 Client had called and peripheral IV still in; client and his wife in and peripheral IV left forearm d/c'ed intact; site unremarkable

## 2020-06-26 NOTE — Discharge Instructions (Signed)
Radial Site Care  This sheet gives you information about how to care for yourself after your procedure. Your health care provider may also give you more specific instructions. If you have problems or questions, contact your health care provider. What can I expect after the procedure? After the procedure, it is common to have:  Bruising and tenderness at the catheter insertion area. Follow these instructions at home: Medicines  Take over-the-counter and prescription medicines only as told by your health care provider. Insertion site care  Follow instructions from your health care provider about how to take care of your insertion site. Make sure you: ? Wash your hands with soap and water before you change your bandage (dressing). If soap and water are not available, use hand sanitizer. ? Change your dressing as told by your health care provider. ? Leave stitches (sutures), skin glue, or adhesive strips in place. These skin closures may need to stay in place for 2 weeks or longer. If adhesive strip edges start to loosen and curl up, you may trim the loose edges. Do not remove adhesive strips completely unless your health care provider tells you to do that.  Check your insertion site every day for signs of infection. Check for: ? Redness, swelling, or pain. ? Fluid or blood. ? Pus or a bad smell. ? Warmth.  Do not take baths, swim, or use a hot tub until your health care provider approves.  You may shower 24-48 hours after the procedure, or as directed by your health care provider. ? Remove the dressing and gently wash the site with plain soap and water. ? Pat the area dry with a clean towel. ? Do not rub the site. That could cause bleeding.  Do not apply powder or lotion to the site. Activity   For 24 hours after the procedure, or as directed by your health care provider: ? Do not flex or bend the affected arm. ? Do not push or pull heavy objects with the affected arm. ? Do not  drive yourself home from the hospital or clinic. You may drive 24 hours after the procedure unless your health care provider tells you not to. ? Do not operate machinery or power tools.  Do not lift anything that is heavier than 10 lb (4.5 kg), or the limit that you are told, until your health care provider says that it is safe.  Ask your health care provider when it is okay to: ? Return to work or school. ? Resume usual physical activities or sports. ? Resume sexual activity. General instructions  If the catheter site starts to bleed, raise your arm and put firm pressure on the site. If the bleeding does not stop, get help right away. This is a medical emergency.  If you went home on the same day as your procedure, a responsible adult should be with you for the first 24 hours after you arrive home.  Keep all follow-up visits as told by your health care provider. This is important. Contact a health care provider if:  You have a fever.  You have redness, swelling, or yellow drainage around your insertion site. Get help right away if:  You have unusual pain at the radial site.  The catheter insertion area swells very fast.  The insertion area is bleeding, and the bleeding does not stop when you hold steady pressure on the area.  Your arm or hand becomes pale, cool, tingly, or numb. These symptoms may represent a serious problem   that is an emergency. Do not wait to see if the symptoms will go away. Get medical help right away. Call your local emergency services (911 in the [place] Do not drive yourself to the hospital. Summary  After the procedure, it is common to have bruising and tenderness at the site.  Follow instructions from your health care provider about how to take care of your radial site wound. Check the wound every day for signs of infection.  Do not lift anything that is heavier than 10 lb (4.5 kg), or the limit that you are told, until your health care provider says  that it is safe. This information is not intended to replace advice given to you by your health care provider. Make sure you discuss any questions you have with your health care provider. Document Revised: 01/05/2018 Document Reviewed: 01/05/2018 [employer] Patient Education  2020 [employer]  

## 2020-06-26 NOTE — Interval H&P Note (Signed)
History and Physical Interval Note:  06/26/2020 2:01 PM  Neil [name]  has presented today for surgery, with the diagnosis of aortic stenosis.  The various methods of treatment have been discussed with the patient and family. After consideration of risks, benefits and other options for treatment, the patient has consented to  Procedure(s): RIGHT/LEFT HEART CATH AND CORONARY ANGIOGRAPHY (N/A) as a surgical intervention.  The patient's history has been reviewed, patient examined, no change in status, stable for surgery.  I have reviewed the patient's chart and labs.  Questions were answered to the patient's satisfaction.     Sherren Mocha

## 2020-06-27 ENCOUNTER — Encounter (HOSPITAL_COMMUNITY): Payer: Self-pay | Admitting: Cardiovascular Disease

## 2020-06-27 MED FILL — Nitroglycerin IV Soln 100 MCG/ML in D5W: INTRA_ARTERIAL | Qty: 20 | Status: AC

## 2020-07-02 ENCOUNTER — Ambulatory Visit (HOSPITAL_COMMUNITY)
Admission: RE | Admit: 2020-07-02 | Discharge: 2020-07-02 | Disposition: A | Discharge status: Home / Self Care | Payer: Medicare HMO | Attending: Cardiovascular Disease | Admitting: Cardiovascular Disease

## 2020-07-02 ENCOUNTER — Ambulatory Visit (HOSPITAL_COMMUNITY)
Admission: RE | Admit: 2020-07-02 | Discharge: 2020-07-02 | Disposition: A | Discharge status: Home / Self Care | Payer: Medicare HMO | Source: Home / Self Care | Attending: Cardiovascular Disease | Admitting: Cardiovascular Disease

## 2020-07-02 ENCOUNTER — Other Ambulatory Visit: Payer: Self-pay

## 2020-07-02 ENCOUNTER — Telehealth: Payer: Self-pay | Admitting: Psychiatry

## 2020-07-02 DIAGNOSIS — I700 Atherosclerosis of aorta: Secondary | ICD-10-CM | POA: Diagnosis not present

## 2020-07-02 DIAGNOSIS — I358 Other nonrheumatic aortic valve disorders: Secondary | ICD-10-CM | POA: Diagnosis not present

## 2020-07-02 DIAGNOSIS — I350 Nonrheumatic aortic (valve) stenosis: Secondary | ICD-10-CM | POA: Diagnosis not present

## 2020-07-02 DIAGNOSIS — Z01818 Encounter for other preprocedural examination: Secondary | ICD-10-CM | POA: Diagnosis not present

## 2020-07-02 DIAGNOSIS — F411 Generalized anxiety disorder: Secondary | ICD-10-CM

## 2020-07-02 DIAGNOSIS — I708 Atherosclerosis of other arteries: Secondary | ICD-10-CM | POA: Diagnosis not present

## 2020-07-02 MED ORDER — IOHEXOL 350 MG/ML SOLN
100.0000 mL | Freq: Once | INTRAVENOUS | Status: AC | PRN
  Administered 2020-07-02: 100 mL via INTRAVENOUS

## 2020-07-02 MED ORDER — LORAZEPAM 0.5 MG PO TABS: 0.5000 mg | ORAL_TABLET | Freq: Four times a day (QID) | ORAL | 1 refills | Status: DC

## 2020-07-02 NOTE — Telephone Encounter (Signed)
Patient called and said that he needs a refill on his ativan to be sent to [facility] on [address]

## 2020-07-02 NOTE — Telephone Encounter (Signed)
Rx called into [facility] per request. Last refill 06/03/2020

## 2020-07-03 ENCOUNTER — Other Ambulatory Visit: Payer: Self-pay | Admitting: *Deleted

## 2020-07-03 MED ORDER — ATORVASTATIN CALCIUM 80 MG PO TABS
80.0000 mg | ORAL_TABLET | Freq: Every day | ORAL | 3 refills | Status: DC
Start: 2020-07-03 — End: 2020-07-26

## 2020-07-05 ENCOUNTER — Encounter: Payer: Self-pay | Admitting: Physician Assistant

## 2020-07-05 DIAGNOSIS — F329 Major depressive disorder, single episode, unspecified: Secondary | ICD-10-CM | POA: Insufficient documentation

## 2020-07-05 DIAGNOSIS — F419 Anxiety disorder, unspecified: Secondary | ICD-10-CM | POA: Insufficient documentation

## 2020-07-05 DIAGNOSIS — F32A Depression, unspecified: Secondary | ICD-10-CM | POA: Insufficient documentation

## 2020-07-05 DIAGNOSIS — E785 Hyperlipidemia, unspecified: Secondary | ICD-10-CM | POA: Insufficient documentation

## 2020-07-05 DIAGNOSIS — I2510 Atherosclerotic heart disease of native coronary artery without angina pectoris: Secondary | ICD-10-CM | POA: Insufficient documentation

## 2020-07-08 ENCOUNTER — Other Ambulatory Visit: Payer: Self-pay

## 2020-07-08 ENCOUNTER — Institutional Professional Consult (permissible substitution): Payer: Medicare HMO | Admitting: Thoracic Surgery (Cardiothoracic Vascular Surgery)

## 2020-07-08 ENCOUNTER — Encounter: Payer: Self-pay | Admitting: Thoracic Surgery (Cardiothoracic Vascular Surgery)

## 2020-07-08 ENCOUNTER — Ambulatory Visit: Payer: Medicare HMO | Admitting: Internal Medicine

## 2020-07-08 VITALS — BP 109/64 | HR 71 | Resp 20 | Wt 125.0 lb

## 2020-07-08 DIAGNOSIS — I2510 Atherosclerotic heart disease of native coronary artery without angina pectoris: Secondary | ICD-10-CM | POA: Diagnosis not present

## 2020-07-08 DIAGNOSIS — I350 Nonrheumatic aortic (valve) stenosis: Secondary | ICD-10-CM | POA: Diagnosis not present

## 2020-07-08 NOTE — Progress Notes (Signed)
[facility]  CARDIOTHORACIC SURGERY CONSULTATION REPORT  Referring Provider is Hilty, Nadean Corwin, MD PCP is Antony Contras, MD  Chief Complaint  Patient presents with  . Aortic Stenosis    new patient consultation review all studies  . 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  Patient states that he has been told that he has had a heart murmur nearly all of his life.  He has been followed in the past by his primary care physician.  Recent follow-up transthoracic echocardiogram revealed findings consistent with severe aortic stenosis and preserved left ventricular function.  The patient was referred for cardiology consultation and initially evaluated by Dr. Debara Pickett.  Repeat echocardiogram was performed May 28, 2020 and revealed findings consistent with very severe aortic stenosis including peak velocity across aortic valve measured nearly 5.5 m/s corresponding to mean transvalvular gradient estimated 77 mmHg and aortic valve area calculated only 0.59 cm with DVI reported 0.19.  Left ventricular systolic function remain normal with ejection fraction estimated 60 to 65%.  The patient was referred to the multidisciplinary heart valve clinic and has been evaluated previously by Dr. Burt Knack.  Diagnostic cardiac catheterization was performed June 26, 2020 and revealed significant left main and three-vessel coronary artery disease.  The aortic valve was not crossed at the time of catheterization.  Right heart pressures were normal.  CT angiography was performed and cardiothoracic surgical consultation requested.  Patient is married and lives locally in [place] with his wife.  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  Approximately 2 years ago he stopped this because of worsening fatigue.  Since that time the patient has complained of having absolutely no energy.  He stopped doing things outside of the house as he found that he just could not do much of anything without being immediately exhausted.  Symptoms were blamed on medications used for depression.  The patient lost his appetite approximately 7 months ago and has apparently lost about 25 pounds in weight.  He has become progressively weak and fatigued.  He continues to deny any symptoms of exertional shortness of breath or chest discomfort, but he admits that he does not do much of anything at home.  He denies any history of PND, orthopnea, or lower extremity edema.  He has not had dizzy spells or syncope.  He reportedly has some difficulty walking due to poor balance and generalized weakness.  Past Medical History:  Diagnosis Date  . Anxiety   . CAD (coronary artery disease)   . Depression   . Dyslipidemia   . Severe aortic stenosis     Past Surgical History:  Procedure Laterality Date  . EYE SURGERY    . FOOT SURGERY Left   . LIPOMA EXCISION    . RIGHT AND LEFT HEART CATH N/A 06/26/2020   Procedure: RIGHT AND LEFT HEART CATH;  Surgeon: Sherren Mocha, MD;  Location: [facility];  Service: Cardiovascular;  Laterality: N/A;    Family History  Problem Relation Age of Onset  . Hypertension Mother   . Heart attack Father   . Hypertension Sister   .  Hypertension Brother     Social History   Socioeconomic History  . Marital status: Married    Spouse name: Not on file  . Number of children: Not on file  . Years of education: Not on file  . Highest education level: Not on file  Occupational History  . Not on file  Tobacco Use  . Smoking status: Never Smoker  . Smokeless tobacco: Never  Used  Substance and Sexual Activity  . Alcohol use: No  . Drug use: Not on file  . Sexual activity: Not on file  Other Topics Concern  . Not on file  Social History Narrative  . Not on file   Social Determinants of Health   Financial Resource Strain:   . Difficulty of Paying Living Expenses:   Food Insecurity:   . Worried About Charity fundraiser in the Last Year:   . Arboriculturist in the Last Year:   Transportation Needs:   . Film/video editor (Medical):   Marland Kitchen Lack of Transportation (Non-Medical):   Physical Activity:   . Days of Exercise per Week:   . Minutes of Exercise per Session:   Stress:   . Feeling of Stress :   Social Connections:   . Frequency of Communication with Friends and Family:   . Frequency of Social Gatherings with Friends and Family:   . Attends Religious Services:   . Active Member of Clubs or Organizations:   . Attends Archivist Meetings:   Marland Kitchen Marital Status:   Intimate Partner Violence:   . Fear of Current or Ex-Partner:   . Emotionally Abused:   Marland Kitchen Physically Abused:   . Sexually Abused:     Current Outpatient Medications  Medication Sig Dispense Refill  . atorvastatin (LIPITOR) 80 MG tablet Take 1 tablet (80 mg total) by mouth daily. 90 tablet 3  . Deutetrabenazine ([name]) 9 MG TABS Take 18 mg by mouth in the morning and at bedtime. 120 tablet 1  . hydrOXYzine (ATARAX/VISTARIL) 25 MG tablet TAKE 1 TABLET BY MOUTH AT BEDTIME AS NEEDED 30 tablet 1  . LORazepam (ATIVAN) 0.5 MG tablet Take 1 tablet (0.5 mg total) by mouth 4 (four) times daily. 120 tablet 1  . Multiple Vitamin (MULTIVITAMIN WITH MINERALS) TABS tablet Take 2 tablets by mouth daily.     . sennosides-docusate sodium (SENOKOT-S) 8.6-50 MG tablet Take 1 tablet by mouth daily with supper.    . sertraline (ZOLOFT) 50 MG tablet TAKE 1 AND 1/2 TABLETS DAILY BY MOUTH (Patient taking differently: Take 75 mg by mouth daily. ) 135 tablet 0  . VITAMIN D PO Take 1 capsule by  mouth daily.     No current facility-administered medications for this visit.    Allergies  Allergen Reactions  . Aspirin Swelling and Other (See Comments)    Tongue swelling   . Sulfur Other (See Comments)    Reaction:  Unknown   . Tetanus Toxoids Swelling and Other (See Comments)    Arm swelling      Review of Systems:   General:  poor appetite, decreased energy, no weight gain, + weight loss, no fever  Cardiac:  no chest pain with exertion, no chest pain at rest, no SOB with exertion, no resting SOB, no PND, no orthopnea, no palpitations, no arrhythmia, no atrial fibrillation, no LE edema, no dizzy spells, no syncope  Respiratory:  no shortness of breath, no home oxygen, no productive cough, no dry cough, no bronchitis,  no wheezing, no hemoptysis, no asthma, no pain with inspiration or cough, no sleep apnea, no CPAP at night  GI:   no difficulty swallowing, no reflux, no frequent heartburn, no hiatal hernia, no abdominal pain, no constipation, no diarrhea, no hematochezia, no hematemesis, no melena  GU:   no dysuria,  no frequency, no urinary tract infection, no hematuria, + enlarged prostate, no kidney stones, no kidney disease  Vascular:  no pain suggestive of claudication, no pain in feet, no leg cramps, no varicose veins, no DVT, no non-healing foot ulcer  Neuro:   no stroke, no TIA's, no seizures, no headaches, no temporary blindness one eye,  no slurred speech, no peripheral neuropathy, no chronic pain, some instability of gait, mild memory/cognitive dysfunction  Musculoskeletal: no arthritis, no joint swelling, no myalgias, some difficulty walking, limited mobility   Skin:   no rash, no itching, no skin infections, no pressure sores or ulcerations  Psych:   + anxiety, + depression, no nervousness, no unusual recent stress  Eyes:   no blurry vision, no floaters, no recent vision changes, + wears glasses or contacts  ENT:   + hearing loss, no loose or painful teeth, no  dentures, last saw dentist October 2020  Hematologic:  no easy bruising, no abnormal bleeding, no clotting disorder, no frequent epistaxis  Endocrine:  no diabetes, does not check CBG's at home           Physical Exam:   BP (!) 109/64 (BP Location: Left Arm, Patient Position: Sitting, Cuff Size: Normal)   Pulse 71   Resp 20   Wt 125 lb (56.7 kg)   SpO2 94% Comment: RA  BMI 20.80 kg/m   General:  Thin, frail-appearing  HEENT:  Unremarkable   Neck:   no JVD, no bruits, no adenopathy   Chest:   clear to auscultation, symmetrical breath sounds, no wheezes, no rhonchi   CV:   RRR, grade III/VI late-peaking crescendo/decrescendo murmur heard best at RSB,  no diastolic murmur  Abdomen:  soft, non-tender, no masses   Extremities:  warm, well-perfused, pulses thready but palpable, no LE edema  Rectal/GU  Deferred  Neuro:   Grossly non-focal and symmetrical throughout  Skin:   Clean and dry but frail-appearing, no rashes, no breakdown   Diagnostic Tests:   ECHOCARDIOGRAM REPORT       Patient Name:  MEHDI GIRONDA Date of Exam: 05/28/2020  Medical Rec #: [id number]    Height:    65.0 in  Accession #:  [id number]   Weight:    134.6 lb  Date of Birth: 1941-08-24    BSA:     1.672 m  Patient Age:  37 years    BP:      122/75 mmHg  Patient Gender: M        HR:      69 bpm.  Exam Location: [address]   Procedure: 2D Echo, Cardiac Doppler and Color Doppler   Indications:  I35.9 Aortic Valve Disorder    History:    Patient has no prior history of Echocardiogram  examinations.         Risk Factors:Dyslipidemia. Murmur.    Sonographer:  Wilford Sports Rodgers-Jones RDCS  Referring Phys: [id number] KENNETH C HILTY   IMPRESSIONS    1. Very severe aortic stenosis is present (V max 5.45 m/s, MG 77 mmHG,  AVA 0.59 cm2, DI 0.19). The aortic valve is tricuspid. Aortic valve  regurgitation is trivial. Severe aortic valve  stenosis.  Aortic valve area,  by VTI measures 0.59 cm. Aortic valve  mean gradient measures 77.0 mmHg. Aortic valve Vmax measures 5.45 m/s.  2. Left ventricular ejection fraction, by estimation, is 60 to 65%. The  left ventricle has normal function. The left ventricle has no regional  wall motion abnormalities. There is mild concentric left ventricular  hypertrophy. Left ventricular diastolic  function could not be evaluated.  3. Right ventricular systolic function is normal. The right ventricular  size is normal. Tricuspid regurgitation signal is inadequate for assessing  PA pressure.  4. The mitral valve is grossly normal. Trivial mitral valve  regurgitation. No evidence of mitral stenosis.  5. The inferior vena cava is normal in size with greater than 50%  respiratory variability, suggesting right atrial pressure of 3 mmHg.   Conclusion(s)/Recommendation(s): Patient already being evaluated for TAVR.   FINDINGS  Left Ventricle: Left ventricular ejection fraction, by estimation, is 60  to 65%. The left ventricle has normal function. The left ventricle has no  regional wall motion abnormalities. The left ventricular internal cavity  size was normal in size. There is  mild concentric left ventricular hypertrophy. Left ventricular diastolic  function could not be evaluated due to mitral annular calcification  (moderate or greater). Left ventricular diastolic function could not be  evaluated.   Right Ventricle: The right ventricular size is normal. No increase in  right ventricular wall thickness. Right ventricular systolic function is  normal. Tricuspid regurgitation signal is inadequate for assessing PA  pressure.   Left Atrium: Left atrial size was normal in size.   Right Atrium: Right atrial size was normal in size.   Pericardium: Trivial pericardial effusion is present.   Mitral Valve: The mitral valve is grossly normal. Mild to moderate mitral  annular calcification.  Trivial mitral valve regurgitation. No evidence of  mitral valve stenosis.   Tricuspid Valve: The tricuspid valve is grossly normal. Tricuspid valve  regurgitation is trivial. No evidence of tricuspid stenosis.   Aortic Valve: Very severe aortic stenosis is present (V max 5.45 m/s, MG  77 mmHG, AVA 0.59 cm2, DI 0.19). The aortic valve is tricuspid. . There is  severe thickening and severe calcifcation of the aortic valve. Aortic  valve regurgitation is trivial.  Severe aortic stenosis is present. There is severe thickening of the  aortic valve. There is severe calcifcation of the aortic valve. Aortic  valve mean gradient measures 77.0 mmHg. Aortic valve peak gradient  measures 118.8 mmHg. Aortic valve area, by VTI  measures 0.59 cm.   Pulmonic Valve: The pulmonic valve was grossly normal. Pulmonic valve  regurgitation is not visualized. No evidence of pulmonic stenosis.   Aorta: The aortic root and ascending aorta are structurally normal, with  no evidence of dilitation.   Venous: The inferior vena cava is normal in size with greater than 50%  respiratory variability, suggesting right atrial pressure of 3 mmHg.   IAS/Shunts: The atrial septum is grossly normal.     LEFT VENTRICLE  PLAX 2D  LVIDd:     4.30 cm Diastology  LVIDs:     2.80 cm LV e' lateral:  5.22 cm/s  LV PW:     1.20 cm LV E/e' lateral: 19.5  LV IVS:    1.30 cm LV e' medial:  5.11 cm/s  LVOT diam:   2.00 cm LV E/e' medial: 20.0  LV SV:     78  LV SV Index:  47  LVOT Area:   3.14 cm  RIGHT VENTRICLE  RV Basal diam: 2.60 cm  RV S prime:   25.45 cm/s  TAPSE (M-mode): 2.3 cm   LEFT ATRIUM       Index    RIGHT ATRIUM      Index  LA diam:    3.90 cm 2.33 cm/m RA Area:   11.20 cm  LA Vol (A2C):  57.3 ml 34.28 ml/m RA Volume:  26.30 ml 15.73 ml/m  LA Vol (A4C):  38.8 ml 23.21 ml/m  LA Biplane Vol: 48.8 ml 29.19 ml/m  AORTIC VALVE  AV  Area (Vmax):  0.49 cm  AV Area (Vmean):  0.40 cm  AV Area (VTI):   0.59 cm  AV Vmax:      545.00 cm/s  AV Vmean:     462.000 cm/s  AV VTI:      1.330 m  AV Peak Grad:   118.8 mmHg  AV Mean Grad:   77.0 mmHg  LVOT Vmax:     84.85 cm/s  LVOT Vmean:    58.250 cm/s  LVOT VTI:     0.248 m  LVOT/AV VTI ratio: 0.19    AORTA  Ao Root diam: 2.80 cm  Ao Asc diam: 2.70 cm   MITRAL VALVE  MV Area (PHT): 2.07 cm   SHUNTS  MV Decel Time: 366 msec   Systemic VTI: 0.25 m  MV E velocity: 102.00 cm/s Systemic Diam: 2.00 cm  MV A velocity: 144.00 cm/s  MV E/A ratio: 0.71   Eleonore Chiquito MD  Electronically signed by Eleonore Chiquito MD  Signature Date/Time: 05/28/2020/7:30:37 PM      RIGHT AND LEFT HEART CATH  Conclusion  1.  A total occlusion of the RCA with left-to-right collaterals 2.  Moderate ostial left main stenosis estimated at 50% 3.  Severe stenosis of the mid left circumflex/second obtuse marginal 4.  Mild nonobstructive LAD stenosis 5.  Known critical aortic stenosis with a calcified, severely restricted aortic valve on plain fluoroscopy  Recommendations: Continued multidisciplinary heart team evaluation for treatment of critical aortic stenosis and multivessel coronary artery disease. Indications  Severe aortic stenosis [I35.0 (ICD-10-CM)]  Procedural Details  Technical Details INDICATION: Critical aortic stenosis  PROCEDURAL DETAILS: There was an indwelling IV in a right antecubital vein. Using normal sterile technique, the IV was changed out for a 5 Fr brachial sheath over a 0.018 inch wire. The right wrist was then prepped, draped, and anesthetized with 1% lidocaine. Using the modified Seldinger technique a 5/6 French Slender sheath was placed in the right radial artery. Intra-arterial verapamil was administered through the radial artery sheath. IV heparin was administered after a JR4 catheter was advanced into the central  aorta. A Swan-Ganz catheter was used for the right heart catheterization. Standard protocol was followed for recording of right heart pressures and sampling of oxygen saturations. Fick cardiac output was calculated. Standard Judkins catheters were used for selective coronary angiography.  No attempt was made to cross the aortic valve because of the patient's known critical aortic stenosis.  There were no immediate procedural complications. The patient was transferred to the post catheterization recovery area for further monitoring.    Estimated blood loss <50 mL.   During this procedure medications were administered to achieve and maintain moderate conscious sedation while the patient's heart rate, blood pressure, and oxygen saturation were continuously monitored and I was present face-to-face 100% of this time.  Medications (Filter: Administrations occurring from 1345 to 1456 on 06/26/20) fentaNYL (SUBLIMAZE) injection (mcg) Total dose:  25 mcg Date/Time  Rate/Dose/Volume Action  06/26/20 1405  25 mcg Given    midazolam (VERSED) injection (mg) Total dose:  1 mg Date/Time  Rate/Dose/Volume Action  06/26/20 1405  1 mg Given    Heparin (Porcine) in NaCl 1000-0.9 UT/500ML-% SOLN (mL) Total volume:  1,000 mL Date/Time  Rate/Dose/Volume Action  06/26/20 1420  500 mL Given  1420  500 mL Given    Radial Cocktail/Verapamil only (mL) Total volume:  10 mL Date/Time  Rate/Dose/Volume Action  06/26/20 1420  10 mL Given    lidocaine (PF) (XYLOCAINE) 1 % injection (mL) Total volume:  5 mL Date/Time  Rate/Dose/Volume Action  06/26/20 1420  5 mL Given    heparin sodium (porcine) injection (Units) Total dose:  3,000 Units Date/Time  Rate/Dose/Volume Action  06/26/20 1424  3,000 Units Given    iohexol (OMNIPAQUE) 350 MG/ML injection (mL) Total volume:  100 mL Date/Time  Rate/Dose/Volume Action  06/26/20 1443  100 mL Given    Sedation Time  Sedation Time Physician-1: 31 minutes 12 seconds   Contrast  Medication Name Total Dose  iohexol (OMNIPAQUE) 350 MG/ML injection 100 mL    Radiation/Fluoro  Fluoro time: 3.3 (min) DAP: 8.5 (mGycm2) Cumulative Air Kerma: 128.2 (mGy)  Coronary Findings  Diagnostic Dominance: Right Left Main  Ost LM to Mid LM lesion is 50% stenosed. The lesion is calcified.  Left Anterior Descending  Mid LAD lesion is 40% stenosed.  Left Circumflex  Ost Cx to Prox Cx lesion is 60% stenosed. The lesion is calcified.  Mid Cx lesion is 80% stenosed with 90% stenosed side branch in 2nd Mrg.  Right Coronary Artery  Ost RCA to Prox RCA lesion is 100% stenosed. The lesion is severely calcified.  Right Posterior Descending Artery  Collaterals  RPDA filled by collaterals from 1st Sept.    First Right Posterolateral Branch  Collaterals  1st RPL filled by collaterals from 2nd Sept.    Intervention  No interventions have been documented. Coronary Diagrams  Diagnostic Dominance: Right  Intervention  Implants   No implant documentation for this case.  Syngo Images  Show images for CARDIAC CATHETERIZATION Images on Long Term Storage  Show images for Irfan, Veal to Procedure Log  Procedure Log    Hemo Data   Most Recent Value  Fick Cardiac Output 7.35 L/min  Fick Cardiac Output Index 4.53 (L/min)/BSA  RA A Wave 6 mmHg  RA V Wave 1 mmHg  RA Mean 1 mmHg  RV Systolic Pressure 25 mmHg  RV Diastolic Pressure 0 mmHg  RV EDP 2 mmHg  PA Systolic Pressure 27 mmHg  PA Diastolic Pressure 5 mmHg  PA Mean 13 mmHg  PW A Wave 12 mmHg  PW V Wave 7 mmHg  PW Mean 6 mmHg  AO Systolic Pressure 811 mmHg  AO Diastolic Pressure 64 mmHg  AO Mean 90 mmHg  QP/QS 0.94  TPVR Index 2.88 HRUI  TSVR Index 19.88 HRUI  PVR SVR Ratio 0.08  TPVR/TSVR Ratio 0.14    Cardiac TAVR CT  TECHNIQUE: The patient was scanned on a Siemens Force 572 slice scanner. A 120 kV retrospective scan was triggered in the descending thoracic aorta at 111  HU's. Gantry rotation speed was 270 msecs and collimation was .9 mm. No beta blockade or nitro were given. The 3D data set was reconstructed in 5% intervals of the R-R cycle. Systolic and diastolic phases were analyzed on a dedicated work station using MPR, MIP and VRT modes.  The patient received 80 cc of contrast.  FINDINGS: Aortic Valve: Tri leaflet calcified with restricted leaflet motion  Aorta: Normal arch vessels no aneurysm mild calcific atherosclerosis  Sinotubular Junction: 23 mm  Ascending Thoracic Aorta: 27 mm  Aortic Arch: 23 mm  Descending Thoracic Aorta: 23 mm  Sinus of Valsalva Measurements:  Non-coronary: 28.8 mm  Right - coronary: 26.6 mm  Left - coronary: 26 mm  Coronary Artery Height above Annulus:  Left Main: 12.9 mm above annulus  Right Coronary: 12.3 mm above annulus  Virtual Basal Annulus Measurements:  Maximum/Minimum Diameter: 27.6 mm x 22.5 mm  Perimeter: 77 mm  Area: 455 mm 2  Coronary Arteries: Sufficient height above annulus for deployment  Optimum Fluoroscopic Angle for Delivery: RAO 7 Caudal 4 degrees  IMPRESSION: 1. Tri leaflet AV with annular area of 455 mm2 suitable for a 26 mm Sapien 3 valve. However the patient has a small STJ/Sinus diameter  2.  Normal aortic root 2.7 cm  3.  Coronary arteries sufficient height above annulus for deployment  4.  Optimum angiographic angle for deployment RAO 7 Caudal 4 degrees  Jenkins Rouge   Electronically Signed   By: Jenkins Rouge M.D.   On: 07/02/2020 11:57     CT ANGIOGRAPHY CHEST, ABDOMEN AND PELVIS  TECHNIQUE: Multidetector CT imaging through the chest, abdomen and pelvis was performed using the standard protocol during bolus administration of intravenous contrast. Multiplanar reconstructed images and MIPs were obtained and reviewed to evaluate the vascular anatomy.  CONTRAST:  121mL OMNIPAQUE IOHEXOL 350 MG/ML SOLN  COMPARISON:  No  priors.  FINDINGS: CTA CHEST FINDINGS  Cardiovascular: Heart size is normal. There is no significant pericardial fluid, thickening or pericardial calcification. There is aortic atherosclerosis, as well as atherosclerosis of the great vessels of the mediastinum and the coronary arteries, including calcified atherosclerotic plaque in the left main, left anterior descending, left circumflex and right coronary arteries. Severe thickening calcification of the aortic valve.  Mediastinum/Lymph Nodes: No pathologically enlarged mediastinal or hilar lymph nodes. Esophagus is unremarkable in appearance. No axillary lymphadenopathy.  Lungs/Pleura: No acute consolidative airspace disease. No pleural effusions. A few scattered 2-3 mm pulmonary nodules are noted in the lungs bilaterally, nonspecific, but statistically likely benign. No other suspicious appearing pulmonary nodules or masses are noted.  Musculoskeletal/Soft Tissues: There are no aggressive appearing lytic or blastic lesions noted in the visualized portions of the skeleton.  CTA ABDOMEN AND PELVIS FINDINGS  Hepatobiliary: No suspicious cystic or solid hepatic lesions. No intra or extrahepatic biliary ductal dilatation. Several gallstones are noted in the lumen of the gallbladder. Gallbladder is decompressed, without surrounding inflammatory changes to suggest an acute cholecystitis at this time.  Pancreas: No pancreatic mass. No pancreatic ductal dilatation. No pancreatic or peripancreatic fluid collections or inflammatory changes.  Spleen: Unremarkable.  Adrenals/Urinary Tract: Bilateral kidneys and bilateral adrenal glands are normal in appearance. No hydroureteronephrosis. Urinary bladder is normal in appearance.  Stomach/Bowel: Normal appearance of the stomach. No pathologic dilatation of small bowel or colon. 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 Normal appendix.  Vascular/Lymphatic: Aortic atherosclerosis, without evidence of aneurysm or dissection in the abdominal or pelvic vasculature. No lymphadenopathy noted in the abdomen or pelvis.  Reproductive: Prostate gland and seminal vesicles are unremarkable in appearance.  Other: No significant volume of ascites.  No pneumoperitoneum.  Musculoskeletal: There are no aggressive appearing lytic or blastic lesions  noted in the visualized portions of the skeleton.  VASCULAR MEASUREMENTS PERTINENT TO TAVR:  AORTA:  Minimal Aortic Diameter-13 x 12 mm  Severity of Aortic Calcification-severe  RIGHT PELVIS:  Right Common Iliac Artery -  Minimal Diameter-8.2 x 8.5 mm  Tortuosity-mild  Calcification-moderate  Right External Iliac Artery -  Minimal Diameter-7.1 x 7.3 mm  Tortuosity-moderate  Calcification-none  Right Common Femoral Artery -  Minimal Diameter-6.7 x 6.1 mm  Tortuosity-mild  Calcification-moderate  LEFT PELVIS:  Left Common Iliac Artery -  Minimal Diameter-9.0 x 9.0 mm  Tortuosity-mild  Calcification-moderate  Left External Iliac Artery -  Minimal Diameter-7.3 x 7.3 mm  Tortuosity-moderate  Calcification-none  Left Common Femoral Artery -  Minimal Diameter-6.6 x 5.8 mm  Tortuosity-mild  Calcification-moderate to severe  Review of the MIP images confirms the above findings.  IMPRESSION: 1. Vascular findings and measurements pertinent to potential TAVR procedure, as detailed above. 2. Severe thickening calcification of the aortic valve, compatible with reported clinical history of severe aortic stenosis. 3. Severe colonic diverticulosis without evidence of acute diverticulitis at this time. 4. Cholelithiasis without evidence of acute diverticulitis at this time. 5. Aortic atherosclerosis, in addition to left  main and 3 vessel coronary artery disease. 6. Additional incidental findings, as above.   Electronically Signed   By: Vinnie Langton M.D.   On: 07/02/2020 11:46    EKG: NSR w/out significant AV conduction delay (06/26/2020)    Impression:  Patient has stage D1 very severe, symptomatic aortic stenosis as well as significant left main and three-vessel coronary artery disease.  Although the patient denies any symptoms of exertional shortness of breath or chest discomfort, he has a long history of progressive and quite severe generalized fatigue with exhaustion, loss of appetite, and weight loss.  The symptoms may certainly be at least partially related to an underlying history of depression, I feel the patient has critical aortic stenosis and coronary artery disease with profound generalized weakness and cardiac cachexia.  I have personally reviewed the patient's recent transthoracic echocardiogram, diagnostic cardiac catheterization, and CT angiograms.  Echocardiogram reveals normal left ventricular systolic function with very severe aortic stenosis.  The aortic valve is trileaflet with severe thickening, calcification, and restricted leaflet mobility involving all 3 leaflets of the aortic valve.  Peak velocity across aortic valve measured close to 5.5 m/s corresponding to mean transvalvular gradient estimated 77 mmHg and aortic valve area calculated only 0.59 cm with DVI reported 0.19.  Diagnostic cardiac catheterization revealed significant left main and three-vessel coronary artery disease with chronic occlusion of the proximal right coronary artery, left to right collateral filling of the terminal branches of the right coronary artery, and at least 50% stenosis of the left main coronary artery.  Under the circumstances, conventional surgical aortic valve replacement with coronary artery bypass grafting would likely provide the best long-term result.  However, risks associated with surgery  would clearly be significantly impacted by the patient's profound generalized weakness, malnutrition, and depression.  Cardiac-gated CTA of the heart reveals anatomical characteristics consistent with aortic stenosis suitable for treatment by transcatheter aortic valve replacement without any significant complicating features and CTA of the aorta and iliac vessels demonstrate what appears to be adequate pelvic vascular access to facilitate a transfemoral approach.  EKG reveals sinus rhythm without significant AV conduction delay.    Plan:  The patient and his wife were counseled at length regarding treatment alternatives for management of severe symptomatic aortic stenosis and coronary artery disease. Alternative approaches such as  conventional aortic valve replacement with coronary artery bypass grafting, transcatheter aortic valve replacement, and continued medical therapy without intervention were compared and contrasted at length.  The risks associated with conventional surgery were discussed in detail, as were expectations for post-operative convalescence, and why I would be reluctant to consider this patient a candidate for conventional surgery.  Issues specific to transcatheter aortic valve replacement were discussed including the presence of significant left main coronary artery stenosis, questions about long term valve durability, the potential for paravalvular leak, possible increased risk of need for permanent pacemaker placement, and other technical complications related to the procedure itself.  Long-term prognosis with medical therapy was discussed. This discussion was placed in the context of the patient's own specific clinical presentation and past medical history.  All of their questions have been addressed.  The patient and his wife desire to take a few days to think about treatment options further before making a final decision.  As a next step the patient will undergo formal physical  therapy evaluation to include 6-minute walk test and frailty assessment.  Blood work will be obtained to assess the patient's baseline nutritional status.  Carotid duplex scanning will be performed.  Once all of these tests have been completed patient will return to our office to discuss treatment options further.    I spent in excess of 90 minutes during the conduct of this office consultation and >50% of this time involved direct face-to-face encounter with the patient for counseling and/or coordination of their care.      Valentina Gu. Roxy Manns, MD 07/08/2020 4:15 PM

## 2020-07-08 NOTE — Patient Instructions (Signed)
Continue all previous medications without any changes at this time  

## 2020-07-09 ENCOUNTER — Encounter: Payer: Self-pay | Admitting: Physical Therapy

## 2020-07-09 ENCOUNTER — Other Ambulatory Visit: Payer: Self-pay

## 2020-07-09 ENCOUNTER — Other Ambulatory Visit: Payer: Medicare HMO

## 2020-07-09 ENCOUNTER — Ambulatory Visit: Payer: Medicare HMO | Attending: Cardiovascular Disease | Admitting: Physical Therapy

## 2020-07-09 DIAGNOSIS — R2689 Other abnormalities of gait and mobility: Secondary | ICD-10-CM

## 2020-07-09 DIAGNOSIS — R634 Abnormal weight loss: Secondary | ICD-10-CM

## 2020-07-09 DIAGNOSIS — I350 Nonrheumatic aortic (valve) stenosis: Secondary | ICD-10-CM

## 2020-07-09 NOTE — Therapy (Signed)
[place], [place], [address] Phone: [phone]   Fax:  [phone]  Physical Therapy Evaluation  Patient Details  Name: Neil [name] MRN: [id number] Date of Birth: 1941-10-10 Referring Provider (PT): Dr Sherren Mocha    Encounter Date: 07/09/2020   PT End of Session - 07/09/20 1458    Visit Number 1    Number of Visits 1    Date for PT Re-Evaluation 07/09/20    Authorization Type see below    PT Start Time 0215    PT Stop Time 0258    PT Time Calculation (min) 43 min    Activity Tolerance Patient tolerated treatment well    Behavior During Therapy [facility] for tasks assessed/performed           Past Medical History:  Diagnosis Date  . Anxiety   . CAD (coronary artery disease)   . Depression   . Dyslipidemia   . Severe aortic stenosis     Past Surgical History:  Procedure Laterality Date  . EYE SURGERY    . FOOT SURGERY Left   . LIPOMA EXCISION    . RIGHT AND LEFT HEART CATH N/A 06/26/2020   Procedure: RIGHT AND LEFT HEART CATH;  Surgeon: Sherren Mocha, MD;  Location: [facility];  Service: Cardiovascular;  Laterality: N/A;    There were no vitals filed for this visit.    Subjective Assessment - 07/09/20 1420    Subjective About a year ago the patient began feeling weak when he walks. He was found to have severe aortic stenosis.    Pertinent History anxiety, CAD, depression, dyslipidemia    Currently in Pain? No/denies              [facility] PT Assessment - 07/09/20 0001      Assessment   Medical Diagnosis Severe Aortic Stenosis    Referring Provider (PT) Dr Sherren Mocha     Onset Date/Surgical Date --   1 year ago    Hand Dominance Right    Next MD Visit today     Prior Therapy none       Precautions   Precautions None      Restrictions   Weight Bearing Restrictions No      Balance Screen   Has the patient fallen in the past 6 months Yes    How many times? --    fell at night " took his eyes off what he was looking at"    Has the patient had a decrease in activity level because of a fear of falling?  No    Is the patient reluctant to leave their home because of a fear of falling?  No      Prior Function   Level of Independence Independent with basic ADLs    Vocation Retired      Associate Professor   Overall Cognitive Status Within Functional Limits for tasks assessed    Attention Focused    Focused Attention Appears intact    Memory Appears intact    Awareness Appears intact    Problem Solving Appears intact      Sensation   Light Touch Appears Intact      Coordination   Gross Motor Movements are Fluid and Coordinated Yes    Fine Motor Movements are Fluid and Coordinated Yes      ROM / Strength   AROM / PROM / Strength AROM;PROM;Strength      AROM  Overall AROM Comments full active ROm of the UE and LE       Strength   Overall Strength Comments 4+/5 gross LE strength/ 5/5 UE gross     Strength Assessment Site Hand    Right/Left hand Right;Left    Right Hand Grip (lbs) 20     Left Hand Grip (lbs) 20       Ambulation/Gait   Gait Comments shuffling gait.             [facility] Pre-Surgical Assessment - 07/09/20 0001    5 Meter Walk Test- trial 1 7 sec    5 Meter Walk Test- trial 2 7 sec.     5 Meter Walk Test- trial 3 7 sec.    5 meter walk test average 7 sec    4 Stage Balance Test tolerated for:  10 sec.    4 Stage Balance Test Position 3    comment could not maintain single leg stance     Sit To Stand Test- trial 1 5 sec.    Comment 24 seconds     ADL/IADL Independent with: Bathing;Dressing;Meal prep;Finances    ADL/IADL Needs Assistance with: Valla Leaver work    6 Minute Walk- Baseline yes    BP (mmHg) 98/68    HR (bpm) 69    02 Sat (%RA) 94 %    Modified Borg Scale for Dyspnea 0- Nothing at all    Perceived Rate of Exertion (Borg) 6-    6 Minute Walk Post Test yes    BP (mmHg) 129/77    HR (bpm) 91    02 Sat (%RA) 95 %     Modified Borg Scale for Dyspnea 0- Nothing at all    Perceived Rate of Exertion (Borg) 11- Fairly light    Aerobic Endurance Distance Walked 565    Endurance additional comments no rest break, 67% disability                     Objective measurements completed on examination: See above findings.                            Plan - 07/09/20 1500    Clinical Impression Statement See below    Personal Factors and Comorbidities Comorbidity 1;Comorbidity 2    Comorbidities anxiet, depression    Stability/Clinical Decision Making Stable/Uncomplicated    Clinical Decision Making Low    Rehab Potential Good    PT Frequency One time visit    PT Treatment/Interventions Patient/family education           Patient will benefit from skilled therapeutic intervention in order to improve the following deficits and impairments:     Visit Diagnosis: Other abnormalities of gait and mobility   Clinical Impression Statement: Pt is a 79 yo male presenting to OP PT for evaluation prior to possible TAVR surgery due to severe aortic stenosis. Pt reports onset of fatigue and weakness with ambulation  approximately 12 months ago. Symptoms are limiting ability to ambulate and perform ADL's. Pt presents with normal ROM and strength, decreased balance and is not at high fall risk 4 stage balance test, decreased walking speed and decreased aerobic endurance per 6 minute walk test. Pt ambulated 565 feet in 6 min. At time of rest, patient's HR was 91 bpm and O2 was 95 on room air. Pt reported 0/10 shortness of breath on modified scale for dyspnea. B/P increased significantly with  6 minute walk test. Based on the Short Physical Performance Battery, patient has a frailty rating of 8/12 with </= 5/12 considered frail.    Problem List Patient Active Problem List   Diagnosis Date Noted  . CAD (coronary artery disease)   . Anxiety   . Coronary artery disease involving native coronary  artery of native heart without angina pectoris   . Depression   . Dyslipidemia   . Severe aortic stenosis 06/26/2020    Carney Living PT DPT  07/09/2020, 3:15 PM  [facility] [address] [place], [place], [address] Phone: [phone]   Fax:  [phone]  Name: Neil [name] MRN: [id number] Date of Birth: December 25, 1940

## 2020-07-10 LAB — COMPREHENSIVE METABOLIC PANEL
ALT: 44 IU/L (ref 0–44)
AST: 50 IU/L — ABNORMAL HIGH (ref 0–40)
Albumin/Globulin Ratio: 2 (ref 1.2–2.2)
Albumin: 4.4 g/dL (ref 3.7–4.7)
Alkaline Phosphatase: 69 IU/L (ref 48–121)
BUN/Creatinine Ratio: 18 (ref 10–24)
BUN: 16 mg/dL (ref 8–27)
Bilirubin Total: 0.4 mg/dL (ref 0.0–1.2)
CO2: 25 mmol/L (ref 20–29)
Calcium: 9 mg/dL (ref 8.6–10.2)
Chloride: 93 mmol/L — ABNORMAL LOW (ref 96–106)
Creatinine, Ser: 0.89 mg/dL (ref 0.76–1.27)
GFR calc Af Amer: 95 mL/min/{1.73_m2} (ref >59)
GFR calc non Af Amer: 82 mL/min/{1.73_m2} (ref >59)
Globulin, Total: 2.2 g/dL (ref 1.5–4.5)
Glucose: 103 mg/dL — ABNORMAL HIGH (ref 65–99)
Potassium: 4.8 mmol/L (ref 3.5–5.2)
Sodium: 130 mmol/L — ABNORMAL LOW (ref 134–144)
Total Protein: 6.6 g/dL (ref 6.0–8.5)

## 2020-07-10 LAB — PREALBUMIN: PREALBUMIN: 21 mg/dL (ref 9–32)

## 2020-07-12 ENCOUNTER — Other Ambulatory Visit (HOSPITAL_COMMUNITY): Payer: Medicare HMO

## 2020-07-12 ENCOUNTER — Ambulatory Visit (HOSPITAL_COMMUNITY): Payer: Medicare HMO

## 2020-07-12 ENCOUNTER — Ambulatory Visit: Payer: Medicare HMO | Admitting: Physical Therapy

## 2020-07-12 ENCOUNTER — Other Ambulatory Visit: Payer: Self-pay

## 2020-07-12 ENCOUNTER — Ambulatory Visit (HOSPITAL_COMMUNITY)
Admission: RE | Admit: 2020-07-12 | Discharge: 2020-07-12 | Disposition: A | Discharge status: Home / Self Care | Payer: Medicare HMO | Source: Ambulatory Visit | Attending: Cardiovascular Disease | Admitting: Cardiovascular Disease

## 2020-07-12 DIAGNOSIS — I350 Nonrheumatic aortic (valve) stenosis: Secondary | ICD-10-CM | POA: Diagnosis not present

## 2020-07-15 ENCOUNTER — Ambulatory Visit: Payer: Medicare HMO | Admitting: Thoracic Surgery (Cardiothoracic Vascular Surgery)

## 2020-07-15 ENCOUNTER — Other Ambulatory Visit: Payer: Self-pay

## 2020-07-15 ENCOUNTER — Encounter: Payer: Self-pay | Admitting: Thoracic Surgery (Cardiothoracic Vascular Surgery)

## 2020-07-15 VITALS — BP 124/72 | HR 74 | Temp 98.1°F | Resp 20 | Ht 65.0 in | Wt 126.0 lb

## 2020-07-15 DIAGNOSIS — I2510 Atherosclerotic heart disease of native coronary artery without angina pectoris: Secondary | ICD-10-CM | POA: Diagnosis not present

## 2020-07-15 DIAGNOSIS — I350 Nonrheumatic aortic (valve) stenosis: Secondary | ICD-10-CM

## 2020-07-15 NOTE — Progress Notes (Signed)
[address]       [place] [address]             [phone]     CARDIOTHORACIC SURGERY OFFICE NOTE  Referring Provider is Hilty, Nadean Corwin, MD PCP is Antony Contras, MD   HPI:  Patient is a 79 year old male with history of aortic stenosis, hyperlipidemia, depression, and anxiety who returns the office today to further discuss treatment options for management of severe aortic stenosis with left main and multivessel coronary artery disease.  He was originally seen in consultation on July 08, 2020.  Since then the patient underwent routine blood work, carotid duplex scan, and a formal physical therapy evaluation.  Blood work revealed normal serum albumin with slightly reduced prealbumin and normal electrolytes with exception of mild hyponatremia.  Carotid duplex scan revealed no signs of significant extracranial cerebrovascular disease.  On physical examination the patient's lower extremities displayed mild weakness (grade 4+ out of 5) with shuffling gait.  The patient ambulated 565 feet during a 6-minute walk test with what was felt to be fairly light rate of exertion.  Based on the short physical performance battery the patient's frailty rating was graded 8/12 with less than 5/12 considered frail.  The patient and his wife return to the office today to discuss treatment options further.  The patient's only new concern over the past 2 weeks is that he feels he is not tolerating oral Lipitor due to change in bowel function.  He has stopped taking it.   Current Outpatient Medications  Medication Sig Dispense Refill  . atorvastatin (LIPITOR) 80 MG tablet Take 1 tablet (80 mg total) by mouth daily. 90 tablet 3  . Deutetrabenazine ([name]) 9 MG TABS Take 18 mg by mouth in the morning and at bedtime. 120 tablet 1  . hydrOXYzine (ATARAX/VISTARIL) 25 MG tablet TAKE 1 TABLET BY MOUTH AT BEDTIME AS NEEDED 30 tablet 1  . LORazepam (ATIVAN) 0.5 MG tablet Take 1 tablet (0.5 mg  total) by mouth 4 (four) times daily. 120 tablet 1  . Multiple Vitamin (MULTIVITAMIN WITH MINERALS) TABS tablet Take 2 tablets by mouth daily.     . sennosides-docusate sodium (SENOKOT-S) 8.6-50 MG tablet Take 1 tablet by mouth daily with supper.    . sertraline (ZOLOFT) 50 MG tablet TAKE 1 AND 1/2 TABLETS DAILY BY MOUTH (Patient taking differently: Take 75 mg by mouth daily. ) 135 tablet 0  . VITAMIN D PO Take 1 capsule by mouth daily.     No current facility-administered medications for this visit.      Physical Exam:   BP 124/72   Pulse 74   Temp 98.1 F (36.7 C) (Skin)   Resp 20   Ht 5\' 5"  (1.651 m)   Wt 126 lb (57.2 kg)   SpO2 95% Comment: RA  BMI 20.97 kg/m   General:  Thin and somewhat frail-appearing  Chest:   Clear to auscultation  CV:   Regular rate and rhythm with prominent harsh systolic murmur  Incisions:  n/a  Abdomen:  Soft nontender  Extremities:  Warm and well-perfused  Diagnostic Tests:  Carotid Arterial Duplex Study   Indications:    Severe aortic stenosis - preop for TAVR.  Risk Factors:   Coronary artery disease.  Comparison Study: No prior.   Performing Technologist: Oda Cogan RDMS, RVT     Examination Guidelines: A complete evaluation includes B-mode imaging,  spectral  Doppler, color Doppler, and power Doppler as  needed of all accessible  portions  of each vessel. Bilateral testing is considered an integral part of a  complete  examination. Limited examinations for reoccurring indications may be  performed  as noted.     Right Carotid Findings:  +----------+--------+--------+--------+------------------+-----------------  -+       PSV cm/sEDV cm/sStenosisPlaque DescriptionComments        +----------+--------+--------+--------+------------------+-----------------  -+  CCA Prox 84   17                              +----------+--------+--------+--------+------------------+-----------------  -+  CCA Distal86   18                intimal  thickening  +----------+--------+--------+--------+------------------+-----------------  -+  ICA Prox 58   12   1-39%           intimal  thickening  +----------+--------+--------+--------+------------------+-----------------  -+  ICA Distal66   22                            +----------+--------+--------+--------+------------------+-----------------  -+  ECA    40   6                             +----------+--------+--------+--------+------------------+-----------------  -+   +----------+--------+-------+----------------+-------------------+       PSV cm/sEDV cmsDescribe    Arm Pressure (mmHG)  +----------+--------+-------+----------------+-------------------+  [id number]       Multiphasic, WNL            +----------+--------+-------+----------------+-------------------+   +---------+--------+--+--------+--+---------+  VertebralPSV cm/s41EDV cm/s12Antegrade  +---------+--------+--+--------+--+---------+      Left Carotid Findings:  +----------+--------+--------+--------+------------------+-----------------  -+       PSV cm/sEDV cm/sStenosisPlaque DescriptionComments        +----------+--------+--------+--------+------------------+-----------------  -+  CCA Prox 93   18                            +----------+--------+--------+--------+------------------+-----------------  -+  CCA Distal83   22                intimal  thickening  +----------+--------+--------+--------+------------------+-----------------  -+  ICA Prox 76   16   1-39%           intimal  thickening   +----------+--------+--------+--------+------------------+-----------------  -+  ICA Distal54   18                            +----------+--------+--------+--------+------------------+-----------------  -+  ECA    70   13                            +----------+--------+--------+--------+------------------+-----------------  -+   +----------+--------+--------+----------------+-------------------+       PSV cm/sEDV cm/sDescribe    Arm Pressure (mmHG)  +----------+--------+--------+----------------+-------------------+  [id number]       Multiphasic, WNL            +----------+--------+--------+----------------+-------------------+   +---------+--------+--+--------+--+---------+  VertebralPSV cm/s34EDV cm/s11Antegrade  +---------+--------+--+--------+--+---------+         Summary:  Right Carotid: Velocities in the right ICA are consistent with a 1-39%  stenosis.   Left Carotid: Velocities in the left ICA are consistent with a 1-39%  stenosis.   Vertebrals: Bilateral vertebral arteries demonstrate antegrade flow.  Subclavians: Normal flow hemodynamics were seen in bilateral subclavian  arteries.   *See table(s) above for measurements and observations.      Electronically signed by Deitra Mayo MD on 07/13/2020 at 4:20:11  AM.      Impression:  Patient has stage D1 very severe, symptomatic aortic stenosis as well as significant left main and three-vessel coronary artery disease.  Although the patient denies any symptoms of exertional shortness of breath or chest discomfort, he has a long history of progressive and quite severe generalized fatigue with exhaustion, loss of appetite, and weight loss.  I feel the patient has critical aortic stenosis and coronary artery disease with profound generalized weakness and cardiac cachexia,  although the patient's symptoms may be at least partially related to severe depression.  I have personally reviewed the patient's recent transthoracic echocardiogram, diagnostic cardiac catheterization, and CT angiograms.  Echocardiogram reveals normal left ventricular systolic function with very severe aortic stenosis.  The aortic valve is trileaflet with severe thickening, calcification, and restricted leaflet mobility involving all 3 leaflets of the aortic valve.  Peak velocity across aortic valve measured close to 5.5 m/s corresponding to mean transvalvular gradient estimated 77 mmHg and aortic valve area calculated only 0.59 cm with DVI reported 0.19.  Diagnostic cardiac catheterization revealed significant left main and three-vessel coronary artery disease with chronic occlusion of the proximal right coronary artery, left to right collateral filling of the terminal branches of the right coronary artery, and at least 50% stenosis of the left main coronary artery.  Under the circumstances, conventional surgical aortic valve replacement with coronary artery bypass grafting would clearly provide the best long-term result.  However, risks associated with surgery would clearly be significantly impacted by the patient's generalized weakness, malnutrition, and depression.  Cardiac-gated CTA of the heart reveals anatomical characteristics consistent with aortic stenosis suitable for treatment by transcatheter aortic valve replacement without any significant complicating features and CTA of the aorta and iliac vessels demonstrate what appears to be adequate pelvic vascular access to facilitate a transfemoral approach.  EKG reveals sinus rhythm without significant AV conduction delay.    Plan:  The patient and his wife were again counseled at length regarding treatment alternatives for management of severe symptomatic aortic stenosis and coronary artery disease.  Alternative approaches such as conventional  aortic valve replacement with coronary artery bypass grafting, transcatheter aortic valve replacement, and continued medical therapy without intervention were compared and contrasted at length.  The risks associated with conventional surgery were discussed in detail, as were expectations for post-operative convalescence.   I suspect the patient would likely survive elective aortic valve replacement and coronary artery bypass grafting, although there is no question that he would be at risk for multiple complications and have the potential for a very slow and difficult recovery.  Issues specific to transcatheter aortic valve replacement were discussed including the presence of significant left main coronary artery stenosis with chronic occlusion of the right coronary artery and the associated impact on both procedure risks related to transcatheter aortic valve replacement as well as long-term survival.  The patient's best chances of living at least another 5 to 10 years would be best following definitive surgical treatment of both his aortic stenosis and coronary artery disease.  The patient and his wife desire to think matters over a little bit longer and discussed them further with the rest of the family.  I have encouraged them to make a decision soon given the severity of the patient's aortic stenosis and coronary artery disease.  If the patient desires to  proceed with high risk surgical intervention we could proceed with aortic valve replacement and coronary artery bypass grafting later this week.  Alternatively, transcatheter aortic valve replacement could be scheduled as early as July 23, 2020.  All questions answered.    I spent in excess of 30 minutes during the conduct of this office consultation and >50% of this time involved direct face-to-face encounter with the patient for counseling and/or coordination of their care.    Valentina Gu. Roxy Manns, MD 07/15/2020 4:15 PM

## 2020-07-16 ENCOUNTER — Telehealth: Payer: Self-pay | Admitting: Psychiatry

## 2020-07-16 ENCOUNTER — Other Ambulatory Visit: Payer: Self-pay | Admitting: *Deleted

## 2020-07-16 DIAGNOSIS — I2510 Atherosclerotic heart disease of native coronary artery without angina pectoris: Secondary | ICD-10-CM

## 2020-07-16 DIAGNOSIS — I350 Nonrheumatic aortic (valve) stenosis: Secondary | ICD-10-CM

## 2020-07-16 NOTE — Pre-Procedure Instructions (Signed)
[id number] [place], [place] [address] [place] [address] Phone: [phone] Fax: [place], [place] [address] [place] [address] Phone: [phone] Fax: [phone]    Your procedure is scheduled on Fri., Aug. 6, 2021 from 7:30AM-2:13PM  Report to [facility] Entrance "A" at 5:30AM  Call this number if you have problems the morning of surgery:  [phone]   Remember:  Do not eat or drink after midnight on Aug. 5th    Take these medicines the morning of surgery with A SIP OF WATER: Deutetrabenazine ([name]) LORazepam (ATIVAN)      Sertraline (ZOLOFT)   As of today, STOP taking all Aspirin (unless instructed by your doctor) and Other Aspirin containing products, Vitamins, Fish oils, and Herbal medications. Also stop all NSAIDS i.e. Advil, Ibuprofen, Motrin, Aleve, Anaprox, Naproxen, BC, Goody Powders, and all Supplements.  No Smoking of any kind, Tobacco, or Alcohol products 24 hours prior to your procedure. If you use a Cpap at night, you may bring all equipment for your overnight stay.     Special instructions:  [facility]- Preparing For Surgery  Before surgery, you can play an important role. Because skin is not sterile, your skin needs to be as free of germs as possible. You can reduce the number of germs on your skin by washing with CHG (chlorahexidine gluconate) Soap before surgery.  CHG is an antiseptic cleaner which kills germs and bonds with the skin to continue killing germs even after washing.   Please do not use if you have an allergy to CHG or antibacterial soaps. If your skin becomes reddened/irritated stop using the CHG.  Do not shave (including legs and underarms) for at least 48 hours prior to first CHG shower. It is OK to shave your face.  Please follow these instructions carefully.   1. Shower the  NIGHT BEFORE SURGERY and the MORNING OF SURGERY with CHG.   2. If you chose to wash your hair, wash your hair first as usual with your normal shampoo.  3. After you shampoo, rinse your hair and body thoroughly to remove the shampoo.  4. Use CHG as you would any other liquid soap. You can apply CHG directly to the skin and wash gently with a scrungie or a clean washcloth.   5. Apply the CHG Soap to your body ONLY FROM THE NECK DOWN.  Do not use on open wounds or open sores. Avoid contact with your eyes, ears, mouth and genitals (private parts). Wash Face and genitals (private parts)  with your normal soap.  6. Wash thoroughly, paying special attention to the area where your surgery will be performed.  7. Thoroughly rinse your body with warm water from the neck down.  8. DO NOT shower/wash with your normal soap after using and rinsing off the CHG Soap.  9. Pat yourself dry with a CLEAN TOWEL.  10. Wear CLEAN PAJAMAS to bed the night before surgery, wear comfortable clothes the morning of surgery  11. Place CLEAN SHEETS on your bed the night of your first shower and DO NOT SLEEP WITH PETS.   Day of Surgery:            Remember to brush your teeth WITH YOUR REGULAR TOOTHPASTE.  Do not wear jewelry.  Do not wear lotions, powders, colognes, or deodorant.  Do not shave 48 hours prior to  surgery.  Men may shave face and neck.  Do not bring valuables to the hospital.  [facility] is not responsible for any belongings or valuables.  Contacts, dentures or bridgework may not be worn into surgery.   For patients admitted to the hospital, discharge time will be determined by your treatment team.  Patients discharged the day of surgery will not be allowed to drive home, and someone age 34 and over needs to stay with them for 24 hours.  Please wear clean clothes to the hospital/surgery center.    Please read over the following fact sheets that you were given.

## 2020-07-17 ENCOUNTER — Ambulatory Visit (HOSPITAL_COMMUNITY): Payer: Medicare HMO

## 2020-07-17 ENCOUNTER — Ambulatory Visit (HOSPITAL_BASED_OUTPATIENT_CLINIC_OR_DEPARTMENT_OTHER)
Admission: RE | Admit: 2020-07-17 | Discharge: 2020-07-17 | Disposition: A | Discharge status: Home / Self Care | Payer: Medicare HMO | Source: Ambulatory Visit | Attending: Thoracic Surgery (Cardiothoracic Vascular Surgery) | Admitting: Thoracic Surgery (Cardiothoracic Vascular Surgery)

## 2020-07-17 ENCOUNTER — Other Ambulatory Visit (HOSPITAL_COMMUNITY): Payer: Medicare HMO

## 2020-07-17 ENCOUNTER — Other Ambulatory Visit (HOSPITAL_COMMUNITY)
Admission: RE | Admit: 2020-07-17 | Discharge: 2020-07-17 | Disposition: A | Discharge status: Home / Self Care | Payer: Medicare HMO | Source: Ambulatory Visit | Attending: Thoracic Surgery (Cardiothoracic Vascular Surgery) | Admitting: Thoracic Surgery (Cardiothoracic Vascular Surgery)

## 2020-07-17 ENCOUNTER — Ambulatory Visit (HOSPITAL_COMMUNITY)
Admission: RE | Admit: 2020-07-17 | Discharge: 2020-07-17 | Disposition: A | Discharge status: Home / Self Care | Payer: Medicare HMO | Source: Ambulatory Visit | Attending: Thoracic Surgery (Cardiothoracic Vascular Surgery) | Admitting: Thoracic Surgery (Cardiothoracic Vascular Surgery)

## 2020-07-17 ENCOUNTER — Other Ambulatory Visit: Payer: Self-pay

## 2020-07-17 ENCOUNTER — Encounter (HOSPITAL_COMMUNITY)
Admission: RE | Admit: 2020-07-17 | Discharge: 2020-07-17 | Disposition: A | Discharge status: Home / Self Care | Payer: Medicare HMO | Source: Ambulatory Visit | Attending: Thoracic Surgery (Cardiothoracic Vascular Surgery) | Admitting: Thoracic Surgery (Cardiothoracic Vascular Surgery)

## 2020-07-17 ENCOUNTER — Encounter (HOSPITAL_COMMUNITY): Payer: Self-pay

## 2020-07-17 DIAGNOSIS — I4581 Long QT syndrome: Secondary | ICD-10-CM | POA: Insufficient documentation

## 2020-07-17 DIAGNOSIS — F209 Schizophrenia, unspecified: Secondary | ICD-10-CM | POA: Insufficient documentation

## 2020-07-17 DIAGNOSIS — I2510 Atherosclerotic heart disease of native coronary artery without angina pectoris: Secondary | ICD-10-CM | POA: Insufficient documentation

## 2020-07-17 DIAGNOSIS — F418 Other specified anxiety disorders: Secondary | ICD-10-CM | POA: Insufficient documentation

## 2020-07-17 DIAGNOSIS — I350 Nonrheumatic aortic (valve) stenosis: Secondary | ICD-10-CM | POA: Insufficient documentation

## 2020-07-17 DIAGNOSIS — Z01818 Encounter for other preprocedural examination: Secondary | ICD-10-CM | POA: Insufficient documentation

## 2020-07-17 DIAGNOSIS — E785 Hyperlipidemia, unspecified: Secondary | ICD-10-CM | POA: Insufficient documentation

## 2020-07-17 DIAGNOSIS — Z20822 Contact with and (suspected) exposure to covid-19: Secondary | ICD-10-CM | POA: Insufficient documentation

## 2020-07-17 DIAGNOSIS — F329 Major depressive disorder, single episode, unspecified: Secondary | ICD-10-CM | POA: Insufficient documentation

## 2020-07-17 DIAGNOSIS — Z79899 Other long term (current) drug therapy: Secondary | ICD-10-CM | POA: Insufficient documentation

## 2020-07-17 HISTORY — DX: Schizophrenia, unspecified: F20.9

## 2020-07-17 LAB — CBC
HCT: 42.5 % (ref 39.0–52.0)
Hemoglobin: 14.3 g/dL (ref 13.0–17.0)
MCH: 30.7 pg (ref 26.0–34.0)
MCHC: 33.6 g/dL (ref 30.0–36.0)
MCV: 91.2 fL (ref 80.0–100.0)
Platelets: 230 10*3/uL (ref 150–400)
RBC: 4.66 MIL/uL (ref 4.22–5.81)
RDW: 12.1 % (ref 11.5–15.5)
WBC: 9.9 10*3/uL (ref 4.0–10.5)
nRBC: 0 % (ref 0.0–0.2)

## 2020-07-17 LAB — URINALYSIS, ROUTINE W REFLEX MICROSCOPIC
Bilirubin Urine: NEGATIVE
Glucose, UA: NEGATIVE mg/dL
Hgb urine dipstick: NEGATIVE
Ketones, ur: NEGATIVE mg/dL
Leukocytes,Ua: NEGATIVE
Nitrite: NEGATIVE
Protein, ur: NEGATIVE mg/dL
Specific Gravity, Urine: 1.019 (ref 1.005–1.030)
pH: 6 (ref 5.0–8.0)

## 2020-07-17 LAB — COMPREHENSIVE METABOLIC PANEL
ALT: 22 U/L (ref 0–44)
AST: 18 U/L (ref 15–41)
Albumin: 3.8 g/dL (ref 3.5–5.0)
Alkaline Phosphatase: 54 U/L (ref 38–126)
Anion gap: 9 (ref 5–15)
BUN: 13 mg/dL (ref 8–23)
CO2: 23 mmol/L (ref 22–32)
Calcium: 9 mg/dL (ref 8.9–10.3)
Chloride: 97 mmol/L — ABNORMAL LOW (ref 98–111)
Creatinine, Ser: 0.77 mg/dL (ref 0.61–1.24)
GFR calc Af Amer: >60 mL/min (ref >60)
GFR calc non Af Amer: >60 mL/min (ref >60)
Glucose, Bld: 102 mg/dL — ABNORMAL HIGH (ref 70–99)
Potassium: 4.2 mmol/L (ref 3.5–5.1)
Sodium: 129 mmol/L — ABNORMAL LOW (ref 135–145)
Total Bilirubin: 0.7 mg/dL (ref 0.3–1.2)
Total Protein: 6.6 g/dL (ref 6.5–8.1)

## 2020-07-17 LAB — PROTIME-INR
INR: 1 (ref 0.8–1.2)
Prothrombin Time: 13.2 seconds (ref 11.4–15.2)

## 2020-07-17 LAB — BLOOD GAS, ARTERIAL
Acid-Base Excess: 1.5 mmol/L (ref 0.0–2.0)
Bicarbonate: 25.6 mmol/L (ref 20.0–28.0)
Drawn by: 421801
FIO2: 21
O2 Saturation: 97.6 %
Patient temperature: 37
pCO2 arterial: 41.1 mmHg (ref 32.0–48.0)
pH, Arterial: 7.412 (ref 7.350–7.450)
pO2, Arterial: 99.8 mmHg (ref 83.0–108.0)

## 2020-07-17 LAB — SURGICAL PCR SCREEN
MRSA, PCR: NEGATIVE
Staphylococcus aureus: NEGATIVE

## 2020-07-17 LAB — HEMOGLOBIN A1C
Hgb A1c MFr Bld: 5.3 % (ref 4.8–5.6)
Mean Plasma Glucose: 105.41 mg/dL

## 2020-07-17 LAB — SARS CORONAVIRUS 2 (TAT 6-24 HRS): SARS Coronavirus 2: NEGATIVE

## 2020-07-17 LAB — APTT: aPTT: 31 seconds (ref 24–36)

## 2020-07-17 NOTE — Progress Notes (Signed)
PCP - Dr. Iline Oven  Cardiologist - Dr. Debara Pickett  Chest x-ray - 07/17/20  EKG - 06/26/20- (E)  Stress Test - Denies  ECHO - 05/28/20 (E)  Cardiac Cath - 06/26/20 (E)  AICD- na PM-na LOOP-na  Sleep Study - Denies CPAP - Denies  LABS- 07/17/20: CBC, CMP, PT, PTT, ABG, T/S, UA, PCR, COVID  ASA- Denies  ERAS- No  HA1C- 07/17/20  Anesthesia- Yes- cardiac history  Pt denies having chest pain, sob, or fever at this time. All instructions explained to the pt, with a verbal understanding of the material. Pt agrees to go over the instructions while at home for a better understanding. Pt also instructed to self quarantine after being tested for COVID-19. The opportunity to ask questions was provided.  Coronavirus Screening  Have you experienced the following symptoms:  Cough yes/no: No Fever (>100.13F)  yes/no: No Runny nose yes/no: No Sore throat yes/no: No Difficulty breathing/shortness of breath  yes/no: No  Have you or a family member traveled in the last 14 days and where? yes/no: No   If the patient indicates "YES" to the above questions, their PAT will be rescheduled to limit the exposure to others and, the surgeon will be notified. THE PATIENT WILL NEED TO BE ASYMPTOMATIC FOR 14 DAYS.   If the patient is not experiencing any of these symptoms, the PAT nurse will instruct them to NOT bring anyone with them to their appointment since they may have these symptoms or traveled as well.   Please remind your patients and families that hospital visitation restrictions are in effect and the importance of the restrictions.

## 2020-07-18 ENCOUNTER — Encounter (HOSPITAL_COMMUNITY): Payer: Self-pay | Admitting: Thoracic Surgery (Cardiothoracic Vascular Surgery)

## 2020-07-18 MED ORDER — MANNITOL 20 % IV SOLN
INTRAVENOUS | Status: DC
  Filled 2020-07-18: qty 13

## 2020-07-18 MED ORDER — PHENYLEPHRINE HCL-NACL 20-0.9 MG/250ML-% IV SOLN
30.0000 ug/min | INTRAVENOUS | Status: AC
  Administered 2020-07-19: 25 ug/min via INTRAVENOUS
  Filled 2020-07-18: qty 250

## 2020-07-18 MED ORDER — SODIUM CHLORIDE 0.9 % IV SOLN
INTRAVENOUS | Status: DC
  Filled 2020-07-18: qty 30

## 2020-07-18 MED ORDER — EPINEPHRINE HCL 5 MG/250ML IV SOLN IN NS
0.0000 ug/min | INTRAVENOUS | Status: DC
  Filled 2020-07-18: qty 250

## 2020-07-18 MED ORDER — PLASMA-LYTE 148 IV SOLN
INTRAVENOUS | Status: DC
  Filled 2020-07-18: qty 2.5

## 2020-07-18 MED ORDER — NOREPINEPHRINE 4 MG/250ML-% IV SOLN
0.0000 ug/min | INTRAVENOUS | Status: DC
  Filled 2020-07-18: qty 250

## 2020-07-18 MED ORDER — INSULIN REGULAR(HUMAN) IN NACL 100-0.9 UT/100ML-% IV SOLN
INTRAVENOUS | Status: AC
  Administered 2020-07-19: 1 [IU]/h via INTRAVENOUS
  Filled 2020-07-18: qty 100

## 2020-07-18 MED ORDER — DEXMEDETOMIDINE HCL IN NACL 400 MCG/100ML IV SOLN
0.1000 ug/kg/h | INTRAVENOUS | Status: AC
  Administered 2020-07-19: .3 ug/kg/h via INTRAVENOUS
  Filled 2020-07-18: qty 100

## 2020-07-18 MED ORDER — MILRINONE LACTATE IN DEXTROSE 20-5 MG/100ML-% IV SOLN
0.3000 ug/kg/min | INTRAVENOUS | Status: DC
  Filled 2020-07-18: qty 100

## 2020-07-18 MED ORDER — TRANEXAMIC ACID (OHS) PUMP PRIME SOLUTION
2.0000 mg/kg | INTRAVENOUS | Status: DC
  Filled 2020-07-18: qty 1.17

## 2020-07-18 MED ORDER — VANCOMYCIN HCL 1000 MG IV SOLR
INTRAVENOUS | Status: DC
  Filled 2020-07-18: qty 1000

## 2020-07-18 MED ORDER — SODIUM CHLORIDE 0.9 % IV SOLN
750.0000 mg | INTRAVENOUS | Status: AC
  Administered 2020-07-19: 750 mg via INTRAVENOUS
  Filled 2020-07-18: qty 750

## 2020-07-18 MED ORDER — NITROGLYCERIN IN D5W 200-5 MCG/ML-% IV SOLN
2.0000 ug/min | INTRAVENOUS | Status: DC
  Filled 2020-07-18: qty 250

## 2020-07-18 MED ORDER — POTASSIUM CHLORIDE 2 MEQ/ML IV SOLN
80.0000 meq | INTRAVENOUS | Status: DC
  Filled 2020-07-18: qty 40

## 2020-07-18 MED ORDER — TRANEXAMIC ACID 1000 MG/10ML IV SOLN
1.5000 mg/kg/h | INTRAVENOUS | Status: AC
  Administered 2020-07-19: 1.5 mg/kg/h via INTRAVENOUS
  Filled 2020-07-18: qty 25

## 2020-07-18 MED ORDER — TRANEXAMIC ACID (OHS) BOLUS VIA INFUSION
15.0000 mg/kg | INTRAVENOUS | Status: AC
  Administered 2020-07-19: 879 mg via INTRAVENOUS
  Filled 2020-07-18: qty 879

## 2020-07-18 MED ORDER — SODIUM CHLORIDE 0.9 % IV SOLN
1.5000 g | INTRAVENOUS | Status: AC
  Administered 2020-07-19: 1.5 g via INTRAVENOUS
  Filled 2020-07-18: qty 1.5

## 2020-07-18 MED ORDER — VANCOMYCIN HCL 1250 MG/250ML IV SOLN
1250.0000 mg | INTRAVENOUS | Status: AC
  Administered 2020-07-19: 1250 mg via INTRAVENOUS
  Filled 2020-07-18: qty 250

## 2020-07-18 NOTE — Progress Notes (Signed)
Anesthesia Chart Review:  Case: [id number] Date/Time: 07/19/20 0715   Procedures:      CORONARY ARTERY BYPASS GRAFTING (CABG) (N/A Chest)     AORTIC VALVE REPLACEMENT (AVR) (N/A Chest)     TRANSESOPHAGEAL ECHOCARDIOGRAM (TEE) (N/A )   Anesthesia type: General   Pre-op diagnosis:      CAD     SEVERE AS   Location: [facility] OR ROOM 15 / [facility]   Surgeons: Rexene Alberts, MD      DISCUSSION: Patient is a 79 year old male scheduled for the above procedure.  History includes never smoker, CAD, severe aortic stenosis, dyslipidemia, schizophrenia, anxiety, depression.  Appears he is on deutetrabenazine ([name]) for tardive dyskinesia, and it is prescribed by his psychiatrist Lynder Parents, MD. According to UpToDate, deutetrabenazine is a central monoamine-depleting agent; vesicular monoamine transporter type 2 (VMAT2) inhibitor. I spoke with hospital pharmacist and no data found indicating need to hold for surgery. Risks include increased risk of depression and suicidality and QT prolongation.   Preoperative labs from 07/17/20 show mild hyponatremia at 129 (previously 130 on 07/09/20). Na range 129-136 since 05/28/20. Since labs will be within 48 hours of surgery and result of 29 stable over the previous 8 days, I did not enter an order to repeat preoperatively. Labs are marked as reviewed by Dr. Roxy Manns.  Presurgical COVID-19 test negative on 07/17/2020.  Anesthesia team to evaluate on the day of surgery.   VS: BP 116/70   Pulse 60   Temp 36.5 C (Oral)   Resp 17   Ht 5\' 5"  (1.651 m)   Wt 58.6 kg   SpO2 98%   BMI 21.50 kg/m    PROVIDERS: Antony Contras, MD is PCP Pixie Casino, MD is cardiologist Lynder Parents, MD is psychiatrist   LABS: Preoperative labs noted.  See DISCUSSION. (all labs ordered are listed, but only abnormal results are displayed)  Labs Reviewed  BLOOD GAS, ARTERIAL - Abnormal; Notable for the following components:      Result Value   Allens test (pass/fail) BRACHIAL  ARTERY (*)    All other components within normal limits  COMPREHENSIVE METABOLIC PANEL - Abnormal; Notable for the following components:   Sodium 129 (*)    Chloride 97 (*)    Glucose, Bld 102 (*)    All other components within normal limits  URINALYSIS, ROUTINE W REFLEX MICROSCOPIC - Abnormal; Notable for the following components:   Color, Urine AMBER (*)    APPearance HAZY (*)    All other components within normal limits  SURGICAL PCR SCREEN  SARS CORONAVIRUS 2 (TAT 6-24 HRS)  APTT  CBC  HEMOGLOBIN A1C  PROTIME-INR  TYPE AND SCREEN     IMAGES: CXR 07/17/20: FINDINGS: The heart size and mediastinal contours are within normal limits. Both lungs are clear. The visualized skeletal structures are unremarkable. IMPRESSION: No active cardiopulmonary disease.   EKG: 06/26/20: Normal sinus rhythm Nonspecific ST abnormality Abnormal ECG No previous tracing Confirmed by Camnitz, Will [phone]) on 06/26/2020 6:04:47 PM   CV: Carotid US 07/12/20: Summary:  - Right Carotid: Velocities in the right ICA are consistent with a 1-39%  stenosis.  - Left Carotid: Velocities in the left ICA are consistent with a 1-39%  stenosis.  - Vertebrals: Bilateral vertebral arteries demonstrate antegrade flow.  - Subclavians: Normal flow hemodynamics were seen in bilateral subclavian arteries.   CT Coronary 07/02/20: IMPRESSION: 1. Tri leaflet AV with annular area of 455 mm2 suitable for a 26 mm Sapien 3  valve. However the patient has a small STJ/Sinus diameter 2.  Normal aortic root 2.7 cm 3.  Coronary arteries sufficient height above annulus for deployment 4.  Optimum angiographic angle for deployment RAO 7 Caudal 4 degrees  Cardiac cath 06/26/20: 1.  A total occlusion of the RCA with left-to-right collaterals 2.  Moderate ostial left main stenosis estimated at 50% 3.  Severe stenosis of the mid left circumflex/second obtuse marginal 4.  Mild nonobstructive LAD stenosis 5.  Known critical  aortic stenosis with a calcified, severely restricted aortic valve on plain fluoroscopy - Recommendations: Continued multidisciplinary heart team evaluation for treatment of critical aortic stenosis and multivessel coronary artery disease.  Echo 05/28/20: IMPRESSIONS  1. Very severe aortic stenosis is present (V max 5.45 m/s, MG 77 mmHG,  AVA 0.59 cm2, DI 0.19). The aortic valve is tricuspid. Aortic valve  regurgitation is trivial. Severe aortic valve stenosis. Aortic valve area,  by VTI measures 0.59 cm. Aortic valve  mean gradient measures 77.0 mmHg. Aortic valve Vmax measures 5.45 m/s.  2. Left ventricular ejection fraction, by estimation, is 60 to 65%. The  left ventricle has normal function. The left ventricle has no regional  wall motion abnormalities. There is mild concentric left ventricular  hypertrophy. Left ventricular diastolic  function could not be evaluated.  3. Right ventricular systolic function is normal. The right ventricular  size is normal. Tricuspid regurgitation signal is inadequate for assessing  PA pressure.  4. The mitral valve is grossly normal. Trivial mitral valve  regurgitation. No evidence of mitral stenosis.  5. The inferior vena cava is normal in size with greater than 50%  respiratory variability, suggesting right atrial pressure of 3 mmHg.    Past Medical History:  Diagnosis Date  . Anxiety   . CAD (coronary artery disease)   . Depression   . Dyslipidemia   . Schizophrenia ([place])   . Severe aortic stenosis     Past Surgical History:  Procedure Laterality Date  . EYE SURGERY     Bialteral lower eye lids  . FOOT SURGERY Left   . LIPOMA EXCISION    . RIGHT AND LEFT HEART CATH N/A 06/26/2020   Procedure: RIGHT AND LEFT HEART CATH;  Surgeon: Sherren Mocha, MD;  Location: [facility];  Service: Cardiovascular;  Laterality: N/A;    MEDICATIONS: . atorvastatin (LIPITOR) 80 MG tablet  . Deutetrabenazine ([name]) 9 MG TABS  .  hydrOXYzine (ATARAX/VISTARIL) 25 MG tablet  . LORazepam (ATIVAN) 0.5 MG tablet  . Multiple Vitamin (MULTIVITAMIN WITH MINERALS) TABS tablet  . sennosides-docusate sodium (SENOKOT-S) 8.6-50 MG tablet  . sertraline (ZOLOFT) 50 MG tablet  . VITAMIN D PO   No current facility-administered medications for this encounter.    Myra Gianotti, PA-C Surgical Short Stay/Anesthesiology [facility] Phone [phone] [facility] Phone [phone] 07/18/2020 10:26 AM

## 2020-07-18 NOTE — Anesthesia Preprocedure Evaluation (Addendum)
Anesthesia Evaluation  Patient identified by MRN, date of birth, ID band Patient awake    Reviewed: Allergy & Precautions, H&P , NPO status , Patient's Chart, lab work & pertinent test results  Airway Mallampati: II  TM Distance: >3 FB Neck ROM: Full    Dental no notable dental hx. (+) Teeth Intact, Dental Advisory Given   Pulmonary neg pulmonary ROS,    Pulmonary exam normal breath sounds clear to auscultation       Cardiovascular Exercise Tolerance: Good + CAD  + Valvular Problems/Murmurs AS  Rhythm:Regular Rate:Normal + Systolic murmurs    Neuro/Psych Anxiety Depression Schizophrenia negative neurological ROS     GI/Hepatic negative GI ROS, Neg liver ROS,   Endo/Other  negative endocrine ROS  Renal/GU negative Renal ROS  negative genitourinary   Musculoskeletal   Abdominal   Peds  Hematology negative hematology ROS (+)   Anesthesia Other Findings   Reproductive/Obstetrics negative OB ROS                            Anesthesia Physical Anesthesia Plan  ASA: IV  Anesthesia Plan: General   Post-op Pain Management:    Induction: Intravenous  PONV Risk Score and Plan: 2 and Midazolam and Treatment may vary due to age or medical condition  Airway Management Planned: Oral ETT  Additional Equipment: Arterial line, CVP, PA Cath, TEE and Ultrasound Guidance Line Placement  Intra-op Plan:   Post-operative Plan: Post-operative intubation/ventilation  Informed Consent: I have reviewed the patients History and Physical, chart, labs and discussed the procedure including the risks, benefits and alternatives for the proposed anesthesia with the patient or authorized representative who has indicated his/her understanding and acceptance.     Dental advisory given  Plan Discussed with: CRNA  Anesthesia Plan Comments: (See PAT note written 07/18/2020 by Myra Gianotti, PA-C. )       Anesthesia Quick Evaluation

## 2020-07-19 ENCOUNTER — Inpatient Hospital Stay (HOSPITAL_COMMUNITY): Payer: Medicare HMO

## 2020-07-19 ENCOUNTER — Encounter (HOSPITAL_COMMUNITY)
Admission: RE | Disposition: A | Discharge status: Home Health | Payer: Self-pay | Source: Home / Self Care | Attending: Thoracic Surgery (Cardiothoracic Vascular Surgery)

## 2020-07-19 ENCOUNTER — Inpatient Hospital Stay (HOSPITAL_COMMUNITY)
Admission: RE | Disposition: A | Discharge status: Home Health | Payer: Self-pay | Source: Home / Self Care | Attending: Thoracic Surgery (Cardiothoracic Vascular Surgery)

## 2020-07-19 ENCOUNTER — Inpatient Hospital Stay (HOSPITAL_COMMUNITY)
Admission: RE | Admit: 2020-07-19 | Discharge: 2020-07-26 | DRG: 220 | Disposition: A | Discharge status: Home Health | Payer: Medicare HMO | Attending: Thoracic Surgery (Cardiothoracic Vascular Surgery) | Admitting: Thoracic Surgery (Cardiothoracic Vascular Surgery)

## 2020-07-19 ENCOUNTER — Inpatient Hospital Stay (HOSPITAL_COMMUNITY): Payer: Medicare HMO | Admitting: Vascular Surgery

## 2020-07-19 ENCOUNTER — Other Ambulatory Visit: Payer: Self-pay

## 2020-07-19 ENCOUNTER — Inpatient Hospital Stay (HOSPITAL_COMMUNITY): Payer: Medicare HMO | Admitting: Certified Registered Nurse Anesthetist

## 2020-07-19 ENCOUNTER — Encounter (HOSPITAL_COMMUNITY): Payer: Self-pay | Admitting: Thoracic Surgery (Cardiothoracic Vascular Surgery)

## 2020-07-19 DIAGNOSIS — F32A Depression, unspecified: Secondary | ICD-10-CM | POA: Diagnosis present

## 2020-07-19 DIAGNOSIS — Y838 Other surgical procedures as the cause of abnormal reaction of the patient, or of later complication, without mention of misadventure at the time of the procedure: Secondary | ICD-10-CM | POA: Diagnosis not present

## 2020-07-19 DIAGNOSIS — I351 Nonrheumatic aortic (valve) insufficiency: Secondary | ICD-10-CM | POA: Diagnosis not present

## 2020-07-19 DIAGNOSIS — Z8249 Family history of ischemic heart disease and other diseases of the circulatory system: Secondary | ICD-10-CM | POA: Diagnosis not present

## 2020-07-19 DIAGNOSIS — E8770 Fluid overload, unspecified: Secondary | ICD-10-CM | POA: Diagnosis not present

## 2020-07-19 DIAGNOSIS — I2510 Atherosclerotic heart disease of native coronary artery without angina pectoris: Secondary | ICD-10-CM | POA: Diagnosis not present

## 2020-07-19 DIAGNOSIS — I083 Combined rheumatic disorders of mitral, aortic and tricuspid valves: Secondary | ICD-10-CM | POA: Diagnosis not present

## 2020-07-19 DIAGNOSIS — Z886 Allergy status to analgesic agent status: Secondary | ICD-10-CM

## 2020-07-19 DIAGNOSIS — E785 Hyperlipidemia, unspecified: Secondary | ICD-10-CM | POA: Diagnosis present

## 2020-07-19 DIAGNOSIS — J90 Pleural effusion, not elsewhere classified: Secondary | ICD-10-CM | POA: Diagnosis not present

## 2020-07-19 DIAGNOSIS — I350 Nonrheumatic aortic (valve) stenosis: Secondary | ICD-10-CM | POA: Diagnosis not present

## 2020-07-19 DIAGNOSIS — Z20822 Contact with and (suspected) exposure to covid-19: Secondary | ICD-10-CM | POA: Diagnosis present

## 2020-07-19 DIAGNOSIS — D689 Coagulation defect, unspecified: Secondary | ICD-10-CM | POA: Diagnosis not present

## 2020-07-19 DIAGNOSIS — F22 Delusional disorders: Secondary | ICD-10-CM | POA: Diagnosis present

## 2020-07-19 DIAGNOSIS — R54 Age-related physical debility: Secondary | ICD-10-CM | POA: Diagnosis present

## 2020-07-19 DIAGNOSIS — I454 Nonspecific intraventricular block: Secondary | ICD-10-CM | POA: Diagnosis not present

## 2020-07-19 DIAGNOSIS — I493 Ventricular premature depolarization: Secondary | ICD-10-CM | POA: Diagnosis not present

## 2020-07-19 DIAGNOSIS — F411 Generalized anxiety disorder: Secondary | ICD-10-CM

## 2020-07-19 DIAGNOSIS — J939 Pneumothorax, unspecified: Secondary | ICD-10-CM | POA: Diagnosis not present

## 2020-07-19 DIAGNOSIS — J9811 Atelectasis: Secondary | ICD-10-CM | POA: Diagnosis not present

## 2020-07-19 DIAGNOSIS — Z882 Allergy status to sulfonamides status: Secondary | ICD-10-CM

## 2020-07-19 DIAGNOSIS — Z79899 Other long term (current) drug therapy: Secondary | ICD-10-CM

## 2020-07-19 DIAGNOSIS — F419 Anxiety disorder, unspecified: Secondary | ICD-10-CM | POA: Diagnosis present

## 2020-07-19 DIAGNOSIS — Z888 Allergy status to other drugs, medicaments and biological substances status: Secondary | ICD-10-CM

## 2020-07-19 DIAGNOSIS — F209 Schizophrenia, unspecified: Secondary | ICD-10-CM | POA: Diagnosis present

## 2020-07-19 DIAGNOSIS — I352 Nonrheumatic aortic (valve) stenosis with insufficiency: Principal | ICD-10-CM | POA: Diagnosis present

## 2020-07-19 DIAGNOSIS — D62 Acute posthemorrhagic anemia: Secondary | ICD-10-CM | POA: Diagnosis not present

## 2020-07-19 DIAGNOSIS — F329 Major depressive disorder, single episode, unspecified: Secondary | ICD-10-CM | POA: Diagnosis present

## 2020-07-19 DIAGNOSIS — D696 Thrombocytopenia, unspecified: Secondary | ICD-10-CM | POA: Diagnosis not present

## 2020-07-19 DIAGNOSIS — Z952 Presence of prosthetic heart valve: Secondary | ICD-10-CM

## 2020-07-19 DIAGNOSIS — I9789 Other postprocedural complications and disorders of the circulatory system, not elsewhere classified: Secondary | ICD-10-CM | POA: Diagnosis not present

## 2020-07-19 DIAGNOSIS — I97611 Postprocedural hemorrhage and hematoma of a circulatory system organ or structure following cardiac bypass: Secondary | ICD-10-CM | POA: Diagnosis not present

## 2020-07-19 DIAGNOSIS — I358 Other nonrheumatic aortic valve disorders: Secondary | ICD-10-CM | POA: Diagnosis not present

## 2020-07-19 DIAGNOSIS — Z951 Presence of aortocoronary bypass graft: Secondary | ICD-10-CM

## 2020-07-19 DIAGNOSIS — Z953 Presence of xenogenic heart valve: Secondary | ICD-10-CM | POA: Diagnosis not present

## 2020-07-19 DIAGNOSIS — I313 Pericardial effusion (noninflammatory): Secondary | ICD-10-CM | POA: Diagnosis not present

## 2020-07-19 DIAGNOSIS — R69 Illness, unspecified: Secondary | ICD-10-CM | POA: Diagnosis not present

## 2020-07-19 DIAGNOSIS — I25810 Atherosclerosis of coronary artery bypass graft(s) without angina pectoris: Secondary | ICD-10-CM | POA: Diagnosis not present

## 2020-07-19 DIAGNOSIS — Z09 Encounter for follow-up examination after completed treatment for conditions other than malignant neoplasm: Secondary | ICD-10-CM

## 2020-07-19 HISTORY — PX: AORTIC ROOT ENLARGEMENT: SHX6346

## 2020-07-19 HISTORY — DX: Presence of aortocoronary bypass graft: Z95.1

## 2020-07-19 HISTORY — PX: EXPLORATION POST OPERATIVE OPEN HEART: SHX5061

## 2020-07-19 HISTORY — PX: CORONARY ARTERY BYPASS GRAFT: SHX141

## 2020-07-19 HISTORY — DX: Presence of xenogenic heart valve: Z95.3

## 2020-07-19 HISTORY — PX: TEE WITHOUT CARDIOVERSION: SHX5443

## 2020-07-19 HISTORY — PX: AORTIC VALVE REPLACEMENT: SHX41

## 2020-07-19 LAB — POCT I-STAT 7, (LYTES, BLD GAS, ICA,H+H)
Acid-Base Excess: 3 mmol/L — ABNORMAL HIGH (ref 0.0–2.0)
Acid-Base Excess: 0 mmol/L (ref 0.0–2.0)
Acid-Base Excess: 0 mmol/L (ref 0.0–2.0)
Acid-Base Excess: 1 mmol/L (ref 0.0–2.0)
Bicarbonate: 26.3 mmol/L (ref 20.0–28.0)
Bicarbonate: 24.6 mmol/L (ref 20.0–28.0)
Bicarbonate: 25 mmol/L (ref 20.0–28.0)
Bicarbonate: 25.2 mmol/L (ref 20.0–28.0)
Calcium, Ion: 0.84 mmol/L — CL (ref 1.15–1.40)
Calcium, Ion: 0.48 mmol/L — CL (ref 1.15–1.40)
Calcium, Ion: 0.98 mmol/L — ABNORMAL LOW (ref 1.15–1.40)
Calcium, Ion: 1.16 mmol/L (ref 1.15–1.40)
HCT: 15 % — ABNORMAL LOW (ref 39.0–52.0)
HCT: 23 % — ABNORMAL LOW (ref 39.0–52.0)
HCT: 23 % — ABNORMAL LOW (ref 39.0–52.0)
HCT: 30 % — ABNORMAL LOW (ref 39.0–52.0)
Hemoglobin: 5.1 g/dL — CL (ref 13.0–17.0)
Hemoglobin: 7.8 g/dL — ABNORMAL LOW (ref 13.0–17.0)
Hemoglobin: 7.8 g/dL — ABNORMAL LOW (ref 13.0–17.0)
Hemoglobin: 10.2 g/dL — ABNORMAL LOW (ref 13.0–17.0)
O2 Saturation: 100 %
O2 Saturation: 100 %
O2 Saturation: 99 %
O2 Saturation: 99 %
Patient temperature: 34
Potassium: 3.5 mmol/L (ref 3.5–5.1)
Potassium: 4.1 mmol/L (ref 3.5–5.1)
Potassium: 3.9 mmol/L (ref 3.5–5.1)
Potassium: 4 mmol/L (ref 3.5–5.1)
Sodium: 140 mmol/L (ref 135–145)
Sodium: 141 mmol/L (ref 135–145)
Sodium: 141 mmol/L (ref 135–145)
Sodium: 139 mmol/L (ref 135–145)
TCO2: 27 mmol/L (ref 22–32)
TCO2: 26 mmol/L (ref 22–32)
TCO2: 26 mmol/L (ref 22–32)
TCO2: 26 mmol/L (ref 22–32)
pCO2 arterial: 30.5 mmHg — ABNORMAL LOW (ref 32.0–48.0)
pCO2 arterial: 40.8 mmHg (ref 32.0–48.0)
pCO2 arterial: 41.8 mmHg (ref 32.0–48.0)
pCO2 arterial: 39.6 mmHg (ref 32.0–48.0)
pH, Arterial: 7.531 — ABNORMAL HIGH (ref 7.350–7.450)
pH, Arterial: 7.388 (ref 7.350–7.450)
pH, Arterial: 7.385 (ref 7.350–7.450)
pH, Arterial: 7.412 (ref 7.350–7.450)
pO2, Arterial: 194 mmHg — ABNORMAL HIGH (ref 83.0–108.0)
pO2, Arterial: 335 mmHg — ABNORMAL HIGH (ref 83.0–108.0)
pO2, Arterial: 155 mmHg — ABNORMAL HIGH (ref 83.0–108.0)
pO2, Arterial: 133 mmHg — ABNORMAL HIGH (ref 83.0–108.0)

## 2020-07-19 LAB — ECHO INTRAOPERATIVE TEE
AR max vel: 1.44 cm2
AV Area VTI: 1.18 cm2
AV Area mean vel: 1.27 cm2
AV Mean grad: 26 mmHg
AV Peak grad: 37.6 mmHg
Ao pk vel: 3.07 m/s
Height: 65 in
Weight: 2064 oz

## 2020-07-19 LAB — PLATELET COUNT: Platelets: 103 10*3/uL — ABNORMAL LOW (ref 150–400)

## 2020-07-19 LAB — CBC
HCT: 16.8 % — ABNORMAL LOW (ref 39.0–52.0)
HCT: 32.6 % — ABNORMAL LOW (ref 39.0–52.0)
Hemoglobin: 5.8 g/dL — CL (ref 13.0–17.0)
Hemoglobin: 11.1 g/dL — ABNORMAL LOW (ref 13.0–17.0)
MCH: 31.2 pg (ref 26.0–34.0)
MCH: 30.3 pg (ref 26.0–34.0)
MCHC: 34.5 g/dL (ref 30.0–36.0)
MCHC: 34 g/dL (ref 30.0–36.0)
MCV: 90.3 fL (ref 80.0–100.0)
MCV: 89.1 fL (ref 80.0–100.0)
Platelets: 118 10*3/uL — ABNORMAL LOW (ref 150–400)
Platelets: 121 10*3/uL — ABNORMAL LOW (ref 150–400)
RBC: 1.86 MIL/uL — ABNORMAL LOW (ref 4.22–5.81)
RBC: 3.66 MIL/uL — ABNORMAL LOW (ref 4.22–5.81)
RDW: 12.9 % (ref 11.5–15.5)
RDW: 13.2 % (ref 11.5–15.5)
WBC: 9.2 10*3/uL (ref 4.0–10.5)
WBC: 8.8 10*3/uL (ref 4.0–10.5)
nRBC: 0 % (ref 0.0–0.2)
nRBC: 0 % (ref 0.0–0.2)

## 2020-07-19 LAB — PROTIME-INR
INR: 1.7 — ABNORMAL HIGH (ref 0.8–1.2)
INR: 0.8 (ref 0.8–1.2)
Prothrombin Time: 19.7 seconds — ABNORMAL HIGH (ref 11.4–15.2)
Prothrombin Time: 11.1 seconds — ABNORMAL LOW (ref 11.4–15.2)

## 2020-07-19 LAB — PREPARE RBC (CROSSMATCH)

## 2020-07-19 LAB — POCT I-STAT, CHEM 8
BUN: 9 mg/dL (ref 8–23)
Calcium, Ion: 0.49 mmol/L — CL (ref 1.15–1.40)
Chloride: 97 mmol/L — ABNORMAL LOW (ref 98–111)
Creatinine, Ser: 0.4 mg/dL — ABNORMAL LOW (ref 0.61–1.24)
Glucose, Bld: 183 mg/dL — ABNORMAL HIGH (ref 70–99)
HCT: 23 % — ABNORMAL LOW (ref 39.0–52.0)
Hemoglobin: 7.8 g/dL — ABNORMAL LOW (ref 13.0–17.0)
Potassium: 4 mmol/L (ref 3.5–5.1)
Sodium: 141 mmol/L (ref 135–145)
TCO2: 25 mmol/L (ref 22–32)

## 2020-07-19 LAB — ABO/RH: ABO/RH(D): A POS

## 2020-07-19 LAB — APTT
aPTT: 45 seconds — ABNORMAL HIGH (ref 24–36)
aPTT: 36 seconds (ref 24–36)

## 2020-07-19 LAB — GLUCOSE, CAPILLARY
Glucose-Capillary: 127 mg/dL — ABNORMAL HIGH (ref 70–99)
Glucose-Capillary: 145 mg/dL — ABNORMAL HIGH (ref 70–99)
Glucose-Capillary: 154 mg/dL — ABNORMAL HIGH (ref 70–99)
Glucose-Capillary: 154 mg/dL — ABNORMAL HIGH (ref 70–99)
Glucose-Capillary: 160 mg/dL — ABNORMAL HIGH (ref 70–99)
Glucose-Capillary: 82 mg/dL (ref 70–99)

## 2020-07-19 LAB — HEMOGLOBIN AND HEMATOCRIT, BLOOD
HCT: 22.2 % — ABNORMAL LOW (ref 39.0–52.0)
Hemoglobin: 7.5 g/dL — ABNORMAL LOW (ref 13.0–17.0)

## 2020-07-19 SURGERY — CORONARY ARTERY BYPASS GRAFTING (CABG)
Anesthesia: General | Site: Chest

## 2020-07-19 SURGERY — EXPLORATION POST OPERATIVE OPEN HEART
Anesthesia: General | Site: Chest

## 2020-07-19 MED ORDER — VANCOMYCIN HCL 1000 MG IV SOLR
INTRAVENOUS | Status: DC
  Filled 2020-07-19: qty 1000

## 2020-07-19 MED ORDER — ORAL CARE MOUTH RINSE: 15.0000 mL | Freq: Once | OROMUCOSAL | Status: AC

## 2020-07-19 MED ORDER — LACTATED RINGERS IV SOLN: INTRAVENOUS | Status: DC

## 2020-07-19 MED ORDER — LACTATED RINGERS IV SOLN: INTRAVENOUS | Status: DC | PRN

## 2020-07-19 MED ORDER — PANTOPRAZOLE SODIUM 40 MG PO TBEC: 40.0000 mg | DELAYED_RELEASE_TABLET | Freq: Every day | ORAL | Status: DC

## 2020-07-19 MED ORDER — SODIUM CHLORIDE 0.9 % IV SOLN
1.5000 g | INTRAVENOUS | Status: DC
  Filled 2020-07-19: qty 1.5

## 2020-07-19 MED ORDER — SODIUM CHLORIDE 0.9% FLUSH: 3.0000 mL | Freq: Two times a day (BID) | INTRAVENOUS | Status: DC

## 2020-07-19 MED ORDER — TRANEXAMIC ACID (OHS) BOLUS VIA INFUSION
15.0000 mg/kg | INTRAVENOUS | Status: DC
  Filled 2020-07-19: qty 878

## 2020-07-19 MED ORDER — MAGNESIUM SULFATE 4 GM/100ML IV SOLN
4.0000 g | Freq: Once | INTRAVENOUS | Status: AC
  Administered 2020-07-19: 4 g via INTRAVENOUS
  Filled 2020-07-19: qty 100

## 2020-07-19 MED ORDER — FAMOTIDINE IN NACL 20-0.9 MG/50ML-% IV SOLN: 20.0000 mg | Freq: Two times a day (BID) | INTRAVENOUS | Status: DC

## 2020-07-19 MED ORDER — CLOPIDOGREL BISULFATE 75 MG PO TABS: 75.0000 mg | ORAL_TABLET | Freq: Every day | ORAL | Status: DC

## 2020-07-19 MED ORDER — MIDAZOLAM HCL 5 MG/5ML IJ SOLN
INTRAMUSCULAR | Status: DC | PRN
  Administered 2020-07-19: 3 mg via INTRAVENOUS
  Administered 2020-07-19: 1 mg via INTRAVENOUS
  Administered 2020-07-19: 2 mg via INTRAVENOUS
  Administered 2020-07-19: 1 mg via INTRAVENOUS

## 2020-07-19 MED ORDER — VANCOMYCIN HCL IN DEXTROSE 1-5 GM/200ML-% IV SOLN
1000.0000 mg | Freq: Once | INTRAVENOUS | Status: AC
  Administered 2020-07-20: 1000 mg via INTRAVENOUS
  Filled 2020-07-19: qty 200

## 2020-07-19 MED ORDER — DEUTETRABENAZINE 9 MG PO TABS: 18.0000 mg | ORAL_TABLET | Freq: Every day | ORAL | Status: DC

## 2020-07-19 MED ORDER — ACETAMINOPHEN 160 MG/5ML PO SOLN: 650.0000 mg | Freq: Once | ORAL | Status: AC

## 2020-07-19 MED ORDER — DEXMEDETOMIDINE HCL IN NACL 400 MCG/100ML IV SOLN
0.0000 ug/kg/h | INTRAVENOUS | Status: DC
  Administered 2020-07-19: 0.7 ug/kg/h via INTRAVENOUS
  Filled 2020-07-19: qty 100

## 2020-07-19 MED ORDER — 0.9 % SODIUM CHLORIDE (POUR BTL) OPTIME
TOPICAL | Status: DC | PRN
  Administered 2020-07-19: 5000 mL

## 2020-07-19 MED ORDER — LACTATED RINGERS IV SOLN
INTRAVENOUS | Status: DC | PRN
Start: 2020-07-19 — End: 2020-07-19

## 2020-07-19 MED ORDER — METOPROLOL TARTRATE 12.5 MG HALF TABLET
12.5000 mg | ORAL_TABLET | Freq: Once | ORAL | Status: DC
  Filled 2020-07-19: qty 1

## 2020-07-19 MED ORDER — HEPARIN SODIUM (PORCINE) 1000 UNIT/ML IJ SOLN
INTRAMUSCULAR | Status: AC
  Filled 2020-07-19: qty 1

## 2020-07-19 MED ORDER — PHENYLEPHRINE HCL-NACL 20-0.9 MG/250ML-% IV SOLN
30.0000 ug/min | INTRAVENOUS | Status: AC
  Administered 2020-07-19: 20 ug/min via INTRAVENOUS
  Filled 2020-07-19: qty 250

## 2020-07-19 MED ORDER — NITROGLYCERIN IN D5W 200-5 MCG/ML-% IV SOLN
0.0000 ug/min | INTRAVENOUS | Status: DC
  Filled 2020-07-19: qty 250

## 2020-07-19 MED ORDER — ORAL CARE MOUTH RINSE
15.0000 mL | OROMUCOSAL | Status: DC
  Administered 2020-07-19 – 2020-07-20 (×5): 15 mL via OROMUCOSAL

## 2020-07-19 MED ORDER — PHENYLEPHRINE 40 MCG/ML (10ML) SYRINGE FOR IV PUSH (FOR BLOOD PRESSURE SUPPORT)
PREFILLED_SYRINGE | INTRAVENOUS | Status: AC
  Filled 2020-07-19: qty 30

## 2020-07-19 MED ORDER — MIDAZOLAM HCL 2 MG/2ML IJ SOLN: 2.0000 mg | INTRAMUSCULAR | Status: DC | PRN

## 2020-07-19 MED ORDER — CHLORHEXIDINE GLUCONATE CLOTH 2 % EX PADS
6.0000 | MEDICATED_PAD | Freq: Every day | CUTANEOUS | Status: DC
  Administered 2020-07-21 – 2020-07-26 (×6): 6 via TOPICAL

## 2020-07-19 MED ORDER — PROPOFOL 10 MG/ML IV BOLUS
INTRAVENOUS | Status: AC
  Filled 2020-07-19: qty 20

## 2020-07-19 MED ORDER — SODIUM CHLORIDE 0.9 % IV SOLN
1.0000 g | Freq: Once | INTRAVENOUS | Status: DC
  Filled 2020-07-19: qty 10

## 2020-07-19 MED ORDER — SODIUM CHLORIDE (PF) 0.9 % IJ SOLN
OROMUCOSAL | Status: DC | PRN
  Administered 2020-07-19 (×5): 4 mL via TOPICAL

## 2020-07-19 MED ORDER — DEXTROSE 50 % IV SOLN: 0.0000 mL | INTRAVENOUS | Status: DC | PRN

## 2020-07-19 MED ORDER — METOPROLOL TARTRATE 12.5 MG HALF TABLET
12.5000 mg | ORAL_TABLET | Freq: Two times a day (BID) | ORAL | Status: DC
  Administered 2020-07-20 – 2020-07-21 (×2): 12.5 mg via ORAL
  Filled 2020-07-19 (×4): qty 1

## 2020-07-19 MED ORDER — ROCURONIUM BROMIDE 10 MG/ML (PF) SYRINGE
PREFILLED_SYRINGE | INTRAVENOUS | Status: AC
  Filled 2020-07-19: qty 20

## 2020-07-19 MED ORDER — VANCOMYCIN HCL IN DEXTROSE 1-5 GM/200ML-% IV SOLN
INTRAVENOUS | Status: AC
  Filled 2020-07-19: qty 200

## 2020-07-19 MED ORDER — CHLORHEXIDINE GLUCONATE 0.12% ORAL RINSE (MEDLINE KIT)
15.0000 mL | Freq: Two times a day (BID) | OROMUCOSAL | Status: DC
  Administered 2020-07-20 – 2020-07-26 (×8): 15 mL via OROMUCOSAL

## 2020-07-19 MED ORDER — LABETALOL HCL 5 MG/ML IV SOLN
10.0000 mg | INTRAVENOUS | Status: DC | PRN
  Administered 2020-07-19: 10 mg via INTRAVENOUS
  Filled 2020-07-19 (×2): qty 4

## 2020-07-19 MED ORDER — SODIUM CHLORIDE 0.9% FLUSH
3.0000 mL | Freq: Two times a day (BID) | INTRAVENOUS | Status: DC
  Administered 2020-07-20 – 2020-07-26 (×9): 3 mL via INTRAVENOUS

## 2020-07-19 MED ORDER — VASOPRESSIN 20 UNIT/ML IV SOLN
INTRAVENOUS | Status: AC
  Filled 2020-07-19: qty 1

## 2020-07-19 MED ORDER — BISACODYL 10 MG RE SUPP: 10.0000 mg | Freq: Every day | RECTAL | Status: DC

## 2020-07-19 MED ORDER — CALCIUM CHLORIDE 10 % IV SOLN
INTRAVENOUS | Status: AC
  Filled 2020-07-19: qty 20

## 2020-07-19 MED ORDER — CHLORHEXIDINE GLUCONATE 4 % EX LIQD: 30.0000 mL | CUTANEOUS | Status: DC

## 2020-07-19 MED ORDER — BISACODYL 5 MG PO TBEC: 10.0000 mg | DELAYED_RELEASE_TABLET | Freq: Every day | ORAL | Status: DC

## 2020-07-19 MED ORDER — 0.9 % SODIUM CHLORIDE (POUR BTL) OPTIME
TOPICAL | Status: DC | PRN
  Administered 2020-07-19: 1000 mL

## 2020-07-19 MED ORDER — FENTANYL CITRATE (PF) 250 MCG/5ML IJ SOLN
INTRAMUSCULAR | Status: DC | PRN
  Administered 2020-07-19 (×4): 50 ug via INTRAVENOUS

## 2020-07-19 MED ORDER — ACETAMINOPHEN 160 MG/5ML PO SOLN
1000.0000 mg | Freq: Four times a day (QID) | ORAL | Status: DC
  Administered 2020-07-19 – 2020-07-20 (×2): 1000 mg
  Filled 2020-07-19 (×2): qty 40.6

## 2020-07-19 MED ORDER — MORPHINE SULFATE (PF) 2 MG/ML IV SOLN
1.0000 mg | INTRAVENOUS | Status: DC | PRN
  Administered 2020-07-19: 4 mg via INTRAVENOUS
  Administered 2020-07-20: 2 mg via INTRAVENOUS
  Administered 2020-07-21 (×2): 4 mg via INTRAVENOUS
  Filled 2020-07-19: qty 1
  Filled 2020-07-19 (×3): qty 2

## 2020-07-19 MED ORDER — PHENYLEPHRINE HCL-NACL 20-0.9 MG/250ML-% IV SOLN: 0.0000 ug/min | INTRAVENOUS | Status: DC

## 2020-07-19 MED ORDER — DEXMEDETOMIDINE HCL IN NACL 400 MCG/100ML IV SOLN: 0.0000 ug/kg/h | INTRAVENOUS | Status: DC

## 2020-07-19 MED ORDER — SODIUM CHLORIDE 0.9% IV SOLUTION: Freq: Once | INTRAVENOUS | Status: AC

## 2020-07-19 MED ORDER — ONDANSETRON HCL 4 MG/2ML IJ SOLN: 4.0000 mg | Freq: Four times a day (QID) | INTRAMUSCULAR | Status: DC | PRN

## 2020-07-19 MED ORDER — ACETAMINOPHEN 650 MG RE SUPP
650.0000 mg | Freq: Once | RECTAL | Status: AC
  Administered 2020-07-19: 650 mg via RECTAL

## 2020-07-19 MED ORDER — VANCOMYCIN HCL 1250 MG/250ML IV SOLN
1250.0000 mg | INTRAVENOUS | Status: DC
  Filled 2020-07-19: qty 250

## 2020-07-19 MED ORDER — CALCIUM CHLORIDE 10 % IV SOLN
INTRAVENOUS | Status: DC | PRN
  Administered 2020-07-19 (×10): 200 mg via INTRAVENOUS

## 2020-07-19 MED ORDER — PHENYLEPHRINE HCL (PRESSORS) 10 MG/ML IV SOLN
INTRAVENOUS | Status: DC | PRN
  Administered 2020-07-19: 80 ug via INTRAVENOUS

## 2020-07-19 MED ORDER — MORPHINE SULFATE (PF) 2 MG/ML IV SOLN: 1.0000 mg | INTRAVENOUS | Status: DC | PRN

## 2020-07-19 MED ORDER — SODIUM CHLORIDE 0.9 % IV SOLN: 250.0000 mL | INTRAVENOUS | Status: DC

## 2020-07-19 MED ORDER — MANNITOL 20 % IV SOLN
Freq: Once | INTRAVENOUS | Status: DC
  Filled 2020-07-19: qty 13

## 2020-07-19 MED ORDER — TRAMADOL HCL 50 MG PO TABS: 50.0000 mg | ORAL_TABLET | ORAL | Status: DC | PRN

## 2020-07-19 MED ORDER — SODIUM CHLORIDE 0.45 % IV SOLN: INTRAVENOUS | Status: DC | PRN

## 2020-07-19 MED ORDER — SODIUM CHLORIDE 0.9 % IV SOLN: INTRAVENOUS | Status: DC

## 2020-07-19 MED ORDER — LACTATED RINGERS IV SOLN: 500.0000 mL | Freq: Once | INTRAVENOUS | Status: DC | PRN

## 2020-07-19 MED ORDER — SODIUM CHLORIDE 0.9 % IV SOLN
1.5000 g | Freq: Two times a day (BID) | INTRAVENOUS | Status: DC
  Filled 2020-07-19: qty 1.5

## 2020-07-19 MED ORDER — MAGNESIUM SULFATE 4 GM/100ML IV SOLN: 4.0000 g | Freq: Once | INTRAVENOUS | Status: DC

## 2020-07-19 MED ORDER — CHLORHEXIDINE GLUCONATE 0.12 % MT SOLN: 15.0000 mL | Freq: Once | OROMUCOSAL | Status: DC

## 2020-07-19 MED ORDER — MIDAZOLAM HCL (PF) 10 MG/2ML IJ SOLN
INTRAMUSCULAR | Status: AC
  Filled 2020-07-19: qty 2

## 2020-07-19 MED ORDER — CHLORHEXIDINE GLUCONATE 0.12 % MT SOLN
15.0000 mL | OROMUCOSAL | Status: AC
  Administered 2020-07-19: 15 mL via OROMUCOSAL

## 2020-07-19 MED ORDER — POTASSIUM CHLORIDE 10 MEQ/50ML IV SOLN
10.0000 meq | INTRAVENOUS | Status: DC
  Administered 2020-07-19: 10 meq via INTRAVENOUS

## 2020-07-19 MED ORDER — SODIUM CHLORIDE 0.9% IV SOLUTION: Freq: Once | INTRAVENOUS | Status: DC

## 2020-07-19 MED ORDER — POTASSIUM CHLORIDE 10 MEQ/50ML IV SOLN
10.0000 meq | INTRAVENOUS | Status: AC
  Administered 2020-07-19 (×2): 10 meq via INTRAVENOUS

## 2020-07-19 MED ORDER — SODIUM CHLORIDE 0.9 % IR SOLN
Status: DC | PRN
  Administered 2020-07-19: 3000 mL

## 2020-07-19 MED ORDER — DOCUSATE SODIUM 100 MG PO CAPS: 200.0000 mg | ORAL_CAPSULE | Freq: Every day | ORAL | Status: DC

## 2020-07-19 MED ORDER — EPHEDRINE 5 MG/ML INJ
INTRAVENOUS | Status: AC
  Filled 2020-07-19: qty 20

## 2020-07-19 MED ORDER — LABETALOL HCL 5 MG/ML IV SOLN
INTRAVENOUS | Status: AC
  Administered 2020-07-19: 10 mg via INTRAVENOUS
  Filled 2020-07-19: qty 4

## 2020-07-19 MED ORDER — FENTANYL CITRATE (PF) 250 MCG/5ML IJ SOLN
INTRAMUSCULAR | Status: DC | PRN
  Administered 2020-07-19: 50 ug via INTRAVENOUS
  Administered 2020-07-19: 100 ug via INTRAVENOUS
  Administered 2020-07-19: 150 ug via INTRAVENOUS
  Administered 2020-07-19 (×2): 50 ug via INTRAVENOUS
  Administered 2020-07-19: 100 ug via INTRAVENOUS
  Administered 2020-07-19: 125 ug via INTRAVENOUS
  Administered 2020-07-19: 200 ug via INTRAVENOUS
  Administered 2020-07-19: 125 ug via INTRAVENOUS
  Administered 2020-07-19 (×3): 100 ug via INTRAVENOUS

## 2020-07-19 MED ORDER — BISACODYL 5 MG PO TBEC
10.0000 mg | DELAYED_RELEASE_TABLET | Freq: Every day | ORAL | Status: DC
  Administered 2020-07-20 – 2020-07-25 (×4): 10 mg via ORAL
  Filled 2020-07-19 (×6): qty 2

## 2020-07-19 MED ORDER — ACETAMINOPHEN 500 MG PO TABS
1000.0000 mg | ORAL_TABLET | Freq: Four times a day (QID) | ORAL | Status: AC
  Administered 2020-07-20 – 2020-07-22 (×5): 1000 mg via ORAL
  Administered 2020-07-23: 500 mg via ORAL
  Administered 2020-07-24 (×2): 1000 mg via ORAL
  Filled 2020-07-19 (×9): qty 2

## 2020-07-19 MED ORDER — SODIUM CHLORIDE 0.9% FLUSH: 3.0000 mL | INTRAVENOUS | Status: DC | PRN

## 2020-07-19 MED ORDER — SODIUM CHLORIDE 0.9 % IV SOLN
INTRAVENOUS | Status: DC
  Filled 2020-07-19: qty 30

## 2020-07-19 MED ORDER — ALBUMIN HUMAN 5 % IV SOLN
250.0000 mL | INTRAVENOUS | Status: DC | PRN
  Administered 2020-07-19: 12.5 g via INTRAVENOUS
  Filled 2020-07-19: qty 250

## 2020-07-19 MED ORDER — ALBUMIN HUMAN 5 % IV SOLN
250.0000 mL | INTRAVENOUS | Status: DC | PRN
  Administered 2020-07-19 (×2): 12.5 g via INTRAVENOUS
  Filled 2020-07-19 (×2): qty 250

## 2020-07-19 MED ORDER — SUCCINYLCHOLINE CHLORIDE 200 MG/10ML IV SOSY
PREFILLED_SYRINGE | INTRAVENOUS | Status: AC
  Filled 2020-07-19: qty 10

## 2020-07-19 MED ORDER — TRANEXAMIC ACID 1000 MG/10ML IV SOLN
1.5000 mg/kg/h | INTRAVENOUS | Status: AC
  Administered 2020-07-19: 1.5 mg/kg/h via INTRAVENOUS
  Filled 2020-07-19: qty 25

## 2020-07-19 MED ORDER — NITROGLYCERIN IN D5W 200-5 MCG/ML-% IV SOLN
2.0000 ug/min | INTRAVENOUS | Status: DC
  Filled 2020-07-19: qty 250

## 2020-07-19 MED ORDER — FUROSEMIDE 10 MG/ML IJ SOLN
20.0000 mg | Freq: Once | INTRAMUSCULAR | Status: AC
  Administered 2020-07-19: 20 mg via INTRAVENOUS
  Filled 2020-07-19: qty 2

## 2020-07-19 MED ORDER — ROCURONIUM BROMIDE 10 MG/ML (PF) SYRINGE
PREFILLED_SYRINGE | INTRAVENOUS | Status: DC | PRN
  Administered 2020-07-19: 50 mg via INTRAVENOUS
  Administered 2020-07-19: 100 mg via INTRAVENOUS
  Administered 2020-07-19: 30 mg via INTRAVENOUS
  Administered 2020-07-19: 50 mg via INTRAVENOUS

## 2020-07-19 MED ORDER — ACETAMINOPHEN 500 MG PO TABS: 1000.0000 mg | ORAL_TABLET | Freq: Four times a day (QID) | ORAL | Status: DC

## 2020-07-19 MED ORDER — METOPROLOL TARTRATE 5 MG/5ML IV SOLN
2.5000 mg | INTRAVENOUS | Status: DC | PRN
  Administered 2020-07-19: 5 mg via INTRAVENOUS
  Filled 2020-07-19: qty 5

## 2020-07-19 MED ORDER — FENTANYL CITRATE (PF) 250 MCG/5ML IJ SOLN
INTRAMUSCULAR | Status: AC
  Filled 2020-07-19: qty 25

## 2020-07-19 MED ORDER — FAMOTIDINE IN NACL 20-0.9 MG/50ML-% IV SOLN
INTRAVENOUS | Status: AC
  Administered 2020-07-19: 20 mg via INTRAVENOUS
  Filled 2020-07-19: qty 50

## 2020-07-19 MED ORDER — SODIUM CHLORIDE 0.9% FLUSH
10.0000 mL | Freq: Two times a day (BID) | INTRAVENOUS | Status: DC
  Administered 2020-07-20 – 2020-07-23 (×4): 10 mL

## 2020-07-19 MED ORDER — POTASSIUM CHLORIDE 2 MEQ/ML IV SOLN
80.0000 meq | INTRAVENOUS | Status: DC
  Filled 2020-07-19: qty 40

## 2020-07-19 MED ORDER — HEPARIN SODIUM (PORCINE) 1000 UNIT/ML IJ SOLN
INTRAMUSCULAR | Status: DC | PRN
  Administered 2020-07-19: 18000 [IU] via INTRAVENOUS
  Administered 2020-07-19: 3000 [IU] via INTRAVENOUS

## 2020-07-19 MED ORDER — HEMOSTATIC AGENTS (NO CHARGE) OPTIME
TOPICAL | Status: DC | PRN
  Administered 2020-07-19: 1 via TOPICAL

## 2020-07-19 MED ORDER — PLASMA-LYTE 148 IV SOLN
INTRAVENOUS | Status: DC | PRN
  Administered 2020-07-19: 500 mL

## 2020-07-19 MED ORDER — SODIUM CHLORIDE 0.9 % IV SOLN
1.5000 g | Freq: Two times a day (BID) | INTRAVENOUS | Status: AC
  Administered 2020-07-20 – 2020-07-21 (×4): 1.5 g via INTRAVENOUS
  Filled 2020-07-19 (×4): qty 1.5

## 2020-07-19 MED ORDER — VANCOMYCIN HCL 1000 MG IV SOLR
INTRAVENOUS | Status: DC | PRN
  Administered 2020-07-19: 1000 mL

## 2020-07-19 MED ORDER — METOPROLOL TARTRATE 25 MG/10 ML ORAL SUSPENSION
12.5000 mg | Freq: Two times a day (BID) | ORAL | Status: DC
  Administered 2020-07-19: 12.5 mg
  Filled 2020-07-19: qty 5

## 2020-07-19 MED ORDER — PLASMA-LYTE 148 IV SOLN
INTRAVENOUS | Status: DC
  Filled 2020-07-19: qty 2.5

## 2020-07-19 MED ORDER — SODIUM CHLORIDE 0.9 % IV SOLN: 10.0000 mL/h | Freq: Once | INTRAVENOUS | Status: DC

## 2020-07-19 MED ORDER — INSULIN REGULAR(HUMAN) IN NACL 100-0.9 UT/100ML-% IV SOLN
INTRAVENOUS | Status: DC
  Filled 2020-07-19: qty 100

## 2020-07-19 MED ORDER — ACETAMINOPHEN 650 MG RE SUPP
650.0000 mg | Freq: Once | RECTAL | Status: AC
  Administered 2020-07-19: 650 mg via RECTAL
  Filled 2020-07-19: qty 1

## 2020-07-19 MED ORDER — INSULIN REGULAR(HUMAN) IN NACL 100-0.9 UT/100ML-% IV SOLN: INTRAVENOUS | Status: DC

## 2020-07-19 MED ORDER — VANCOMYCIN HCL IN DEXTROSE 1-5 GM/200ML-% IV SOLN: 1000.0000 mg | Freq: Once | INTRAVENOUS | Status: DC

## 2020-07-19 MED ORDER — DEXMEDETOMIDINE HCL IN NACL 400 MCG/100ML IV SOLN
0.1000 ug/kg/h | INTRAVENOUS | Status: DC
  Filled 2020-07-19: qty 100

## 2020-07-19 MED ORDER — CALCIUM CHLORIDE 10 % IV SOLN
1.0000 g | Freq: Once | INTRAVENOUS | Status: AC
  Administered 2020-07-19: 1 g via INTRAVENOUS
  Filled 2020-07-19: qty 10

## 2020-07-19 MED ORDER — ACETAMINOPHEN 500 MG PO TABS
1000.0000 mg | ORAL_TABLET | Freq: Once | ORAL | Status: AC
  Administered 2020-07-19: 1000 mg via ORAL
  Filled 2020-07-19: qty 2

## 2020-07-19 MED ORDER — SODIUM CHLORIDE 0.9% FLUSH: 10.0000 mL | INTRAVENOUS | Status: DC | PRN

## 2020-07-19 MED ORDER — SODIUM CHLORIDE 0.9 % IV SOLN
INTRAVENOUS | Status: AC
  Filled 2020-07-19: qty 1.2

## 2020-07-19 MED ORDER — ALBUMIN HUMAN 5 % IV SOLN: INTRAVENOUS | Status: DC | PRN

## 2020-07-19 MED ORDER — NOREPINEPHRINE 4 MG/250ML-% IV SOLN
0.0000 ug/min | INTRAVENOUS | Status: DC
  Filled 2020-07-19: qty 250

## 2020-07-19 MED ORDER — CEFUROXIME SODIUM 750 MG IJ SOLR
INTRAMUSCULAR | Status: AC
  Filled 2020-07-19: qty 750

## 2020-07-19 MED ORDER — FENTANYL CITRATE (PF) 250 MCG/5ML IJ SOLN
INTRAMUSCULAR | Status: AC
  Filled 2020-07-19: qty 5

## 2020-07-19 MED ORDER — OXYCODONE HCL 5 MG PO TABS: 5.0000 mg | ORAL_TABLET | ORAL | Status: DC | PRN

## 2020-07-19 MED ORDER — CHLORHEXIDINE GLUCONATE CLOTH 2 % EX PADS
6.0000 | MEDICATED_PAD | Freq: Every day | CUTANEOUS | Status: DC
  Administered 2020-07-19 – 2020-07-22 (×3): 6 via TOPICAL

## 2020-07-19 MED ORDER — PROTAMINE SULFATE 10 MG/ML IV SOLN
INTRAVENOUS | Status: DC | PRN
  Administered 2020-07-19: 200 mg via INTRAVENOUS
  Administered 2020-07-19: 10 mg via INTRAVENOUS

## 2020-07-19 MED ORDER — ROCURONIUM BROMIDE 10 MG/ML (PF) SYRINGE
PREFILLED_SYRINGE | INTRAVENOUS | Status: DC | PRN
  Administered 2020-07-19: 20 mg via INTRAVENOUS
  Administered 2020-07-19: 80 mg via INTRAVENOUS
  Administered 2020-07-19: 20 mg via INTRAVENOUS

## 2020-07-19 MED ORDER — TRANEXAMIC ACID (OHS) PUMP PRIME SOLUTION
2.0000 mg/kg | INTRAVENOUS | Status: DC
  Filled 2020-07-19 (×2): qty 1.17

## 2020-07-19 MED ORDER — SODIUM CHLORIDE 0.9 % IV SOLN
750.0000 mg | INTRAVENOUS | Status: AC
  Administered 2020-07-19: 750 mg via INTRAVENOUS
  Filled 2020-07-19: qty 750

## 2020-07-19 MED ORDER — ACETAMINOPHEN 160 MG/5ML PO SOLN: 1000.0000 mg | Freq: Four times a day (QID) | ORAL | Status: DC

## 2020-07-19 MED ORDER — EPINEPHRINE HCL 5 MG/250ML IV SOLN IN NS
0.0000 ug/min | INTRAVENOUS | Status: DC
  Filled 2020-07-19: qty 250

## 2020-07-19 MED ORDER — OXYCODONE HCL 5 MG PO TABS
5.0000 mg | ORAL_TABLET | ORAL | Status: DC | PRN
  Administered 2020-07-20: 10 mg via ORAL
  Administered 2020-07-20: 5 mg via ORAL
  Filled 2020-07-19: qty 1
  Filled 2020-07-19: qty 2
  Filled 2020-07-19: qty 1

## 2020-07-19 MED ORDER — FAMOTIDINE IN NACL 20-0.9 MG/50ML-% IV SOLN
20.0000 mg | Freq: Two times a day (BID) | INTRAVENOUS | Status: AC
  Administered 2020-07-20: 20 mg via INTRAVENOUS
  Filled 2020-07-19: qty 50

## 2020-07-19 MED ORDER — PANTOPRAZOLE SODIUM 40 MG PO TBEC
40.0000 mg | DELAYED_RELEASE_TABLET | Freq: Every day | ORAL | Status: DC
  Administered 2020-07-21 – 2020-07-26 (×6): 40 mg via ORAL
  Filled 2020-07-19 (×6): qty 1

## 2020-07-19 MED ORDER — MAGNESIUM SULFATE 50 % IJ SOLN
40.0000 meq | INTRAMUSCULAR | Status: DC
  Filled 2020-07-19: qty 9.85

## 2020-07-19 MED ORDER — NITROGLYCERIN IN D5W 200-5 MCG/ML-% IV SOLN: 0.0000 ug/min | INTRAVENOUS | Status: DC

## 2020-07-19 MED ORDER — MILRINONE LACTATE IN DEXTROSE 20-5 MG/100ML-% IV SOLN
0.3000 ug/kg/min | INTRAVENOUS | Status: DC
  Filled 2020-07-19: qty 100

## 2020-07-19 MED ORDER — PROPOFOL 10 MG/ML IV BOLUS
INTRAVENOUS | Status: DC | PRN
  Administered 2020-07-19: 30 mg via INTRAVENOUS
  Administered 2020-07-19: 170 mg via INTRAVENOUS

## 2020-07-19 MED ORDER — COAGULATION FACTOR VIIA RECOMB 1 MG IV SOLR
45.0000 ug/kg | Freq: Once | INTRAVENOUS | Status: AC
  Administered 2020-07-19: 3 mg via INTRAVENOUS
  Filled 2020-07-19: qty 1

## 2020-07-19 MED ORDER — DOCUSATE SODIUM 100 MG PO CAPS
200.0000 mg | ORAL_CAPSULE | Freq: Every day | ORAL | Status: DC
  Administered 2020-07-20 – 2020-07-22 (×3): 200 mg via ORAL
  Administered 2020-07-23: 100 mg via ORAL
  Administered 2020-07-24 – 2020-07-26 (×3): 200 mg via ORAL
  Filled 2020-07-19 (×7): qty 2

## 2020-07-19 MED ORDER — CALCIUM CHLORIDE 10 % IV SOLN
INTRAVENOUS | Status: DC | PRN
Start: 2020-07-19 — End: 2020-07-19
  Administered 2020-07-19 (×5): 100 mg via INTRAVENOUS

## 2020-07-19 MED ORDER — SODIUM CHLORIDE 0.9 % IV SOLN: 1.0000 g | Freq: Once | INTRAVENOUS | Status: DC

## 2020-07-19 MED ORDER — PROPOFOL 10 MG/ML IV BOLUS
INTRAVENOUS | Status: DC | PRN
  Administered 2020-07-19: 50 mg via INTRAVENOUS

## 2020-07-19 MED ORDER — CHLORHEXIDINE GLUCONATE 0.12 % MT SOLN
15.0000 mL | Freq: Once | OROMUCOSAL | Status: AC
  Administered 2020-07-19: 15 mL via OROMUCOSAL
  Filled 2020-07-19: qty 15

## 2020-07-19 SURGICAL SUPPLY — 140 items
ADAPTER CARDIO PERF ANTE/RETRO (ADAPTER) ×3 IMPLANT
BAG DECANTER FOR FLEXI CONT (MISCELLANEOUS) ×6 IMPLANT
BLADE CLIPPER SURG (BLADE) ×3 IMPLANT
BLADE STERNUM SYSTEM 6 (BLADE) ×3 IMPLANT
BLADE SURG 11 STRL SS (BLADE) ×3 IMPLANT
BNDG ELASTIC 4X5.8 VLCR STR LF (GAUZE/BANDAGES/DRESSINGS) ×3 IMPLANT
BNDG ELASTIC 6X5.8 VLCR STR LF (GAUZE/BANDAGES/DRESSINGS) ×3 IMPLANT
BNDG GAUZE ELAST 4 BULKY (GAUZE/BANDAGES/DRESSINGS) ×3 IMPLANT
CANISTER SUCT 3000ML PPV (MISCELLANEOUS) ×3 IMPLANT
CANNULA EZ GLIDE AORTIC 21FR (CANNULA) ×6 IMPLANT
CANNULA GUNDRY RCSP 15FR (MISCELLANEOUS) ×3 IMPLANT
CANNULA VRC MALB SNGL STG 36FR (MISCELLANEOUS) ×2 IMPLANT
CATH CPB KIT OWEN (MISCELLANEOUS) ×3 IMPLANT
CATH HEART VENT LEFT (CATHETERS) ×2 IMPLANT
CATH THORACIC 36FR (CATHETERS) ×3 IMPLANT
CATH THORACIC 36FR RT ANG (CATHETERS) IMPLANT
CLIP RETRACTION 3.0MM CORONARY (MISCELLANEOUS) ×3 IMPLANT
CLIP VESOCCLUDE MED 24/CT (CLIP) IMPLANT
CLIP VESOCCLUDE SM WIDE 24/CT (CLIP) IMPLANT
CNTNR URN SCR LID CUP LEK RST (MISCELLANEOUS) ×2 IMPLANT
CONN ST 1/4X3/8  BEN (MISCELLANEOUS) ×2
CONN ST 1/4X3/8 BEN (MISCELLANEOUS) ×4 IMPLANT
CONT SPEC 4OZ STRL OR WHT (MISCELLANEOUS) ×1
COVER SURGICAL LIGHT HANDLE (MISCELLANEOUS) ×3 IMPLANT
DEFOGGER ANTIFOG KIT (MISCELLANEOUS) ×3 IMPLANT
DERMABOND ADVANCED (GAUZE/BANDAGES/DRESSINGS) ×2
DERMABOND ADVANCED .7 DNX12 (GAUZE/BANDAGES/DRESSINGS) ×4 IMPLANT
DEVICE SUT CK QUICK LOAD MINI (Prosthesis & Implant Heart) ×3 IMPLANT
DRAIN CHANNEL 32F RND 10.7 FF (WOUND CARE) ×9 IMPLANT
DRAPE CARDIOVASCULAR INCISE (DRAPES) ×1
DRAPE CV SPLIT W-CLR ANES SCRN (DRAPES) IMPLANT
DRAPE INCISE IOBAN 66X45 STRL (DRAPES) ×6 IMPLANT
DRAPE PERI GROIN 82X75IN TIB (DRAPES) IMPLANT
DRAPE SLUSH/WARMER DISC (DRAPES) ×3 IMPLANT
DRAPE SRG 135X102X78XABS (DRAPES) ×2 IMPLANT
DRSG AQUACEL AG ADV 3.5X14 (GAUZE/BANDAGES/DRESSINGS) ×3 IMPLANT
ELECT BLADE 4.0 EZ CLEAN MEGAD (MISCELLANEOUS) ×3
ELECT REM PT RETURN 9FT ADLT (ELECTROSURGICAL) ×6
ELECTRODE BLDE 4.0 EZ CLN MEGD (MISCELLANEOUS) ×2 IMPLANT
ELECTRODE REM PT RTRN 9FT ADLT (ELECTROSURGICAL) ×4 IMPLANT
FELT TEFLON 1X6 (MISCELLANEOUS) ×6 IMPLANT
FIBERTAPE STERNAL CLSR 2 36IN (SUTURE) ×12 IMPLANT
FIBERTAPE STERNAL CLSR 2X36 (SUTURE) ×9 IMPLANT
GAUZE SPONGE 4X4 12PLY STRL (GAUZE/BANDAGES/DRESSINGS) ×6 IMPLANT
GAUZE SPONGE 4X4 12PLY STRL LF (GAUZE/BANDAGES/DRESSINGS) ×6 IMPLANT
GLOVE BIO SURGEON STRL SZ 6 (GLOVE) IMPLANT
GLOVE BIO SURGEON STRL SZ 6.5 (GLOVE) ×9 IMPLANT
GLOVE BIO SURGEON STRL SZ7 (GLOVE) IMPLANT
GLOVE BIO SURGEON STRL SZ7.5 (GLOVE) IMPLANT
GLOVE ORTHO TXT STRL SZ7.5 (GLOVE) ×9 IMPLANT
GOWN STRL REUS W/ TWL LRG LVL3 (GOWN DISPOSABLE) ×8 IMPLANT
GOWN STRL REUS W/TWL LRG LVL3 (GOWN DISPOSABLE) ×4
HEMOSTAT POWDER SURGIFOAM 1G (HEMOSTASIS) ×9 IMPLANT
INSERT FOGARTY XLG (MISCELLANEOUS) ×3 IMPLANT
INSERT SUTURE HOLDER (MISCELLANEOUS) ×6 IMPLANT
IV NS IRRIG 3000ML ARTHROMATIC (IV SOLUTION) ×3 IMPLANT
KIT BASIN OR (CUSTOM PROCEDURE TRAY) ×3 IMPLANT
KIT SUCTION CATH 14FR (SUCTIONS) ×12 IMPLANT
KIT SUT CK MINI COMBO 4X17 (Prosthesis & Implant Heart) ×3 IMPLANT
KIT TURNOVER KIT B (KITS) ×3 IMPLANT
KIT VASOVIEW HEMOPRO 2 VH 4000 (KITS) ×3 IMPLANT
LEAD PACING MYOCARDI (MISCELLANEOUS) ×3 IMPLANT
LINE VENT (MISCELLANEOUS) ×3 IMPLANT
MARKER GRAFT CORONARY BYPASS (MISCELLANEOUS) ×9 IMPLANT
NDL SUT PASSING CERCLAGE MED (SUTURE) ×9
NEEDLE SUT PASSING CERCLAG MED (SUTURE) ×6 IMPLANT
NS IRRIG 1000ML POUR BTL (IV SOLUTION) ×15 IMPLANT
PACK E OPEN HEART (SUTURE) ×3 IMPLANT
PACK OPEN HEART (CUSTOM PROCEDURE TRAY) ×3 IMPLANT
PAD ARMBOARD 7.5X6 YLW CONV (MISCELLANEOUS) ×6 IMPLANT
PAD ELECT DEFIB RADIOL ZOLL (MISCELLANEOUS) ×3 IMPLANT
PENCIL BUTTON HOLSTER BLD 10FT (ELECTRODE) ×3 IMPLANT
POSITIONER HEAD DONUT 9IN (MISCELLANEOUS) ×3 IMPLANT
PUNCH AORTIC ROTATE 4.0MM (MISCELLANEOUS) IMPLANT
PUNCH AORTIC ROTATE 4.5MM 8IN (MISCELLANEOUS) ×3 IMPLANT
PUNCH AORTIC ROTATE 5MM 8IN (MISCELLANEOUS) IMPLANT
SEALANT SURG COSEAL 8ML (VASCULAR PRODUCTS) ×3 IMPLANT
SET CARDIOPLEGIA MPS 5001102 (MISCELLANEOUS) ×3 IMPLANT
SET IRRIG TUBING LAPAROSCOPIC (IRRIGATION / IRRIGATOR) ×3 IMPLANT
SOL ANTI FOG 6CC (MISCELLANEOUS) IMPLANT
SOLUTION ANTI FOG 6CC (MISCELLANEOUS)
SPONGE LAP 18X18 RF (DISPOSABLE) IMPLANT
SPONGE LAP 4X18 RFD (DISPOSABLE) ×3 IMPLANT
STRIP PERIGUARD 6X8 (Vascular Products) ×3 IMPLANT
SUPPORT HEART JANKE-BARRON (MISCELLANEOUS) ×3 IMPLANT
SUT BONE WAX W31G (SUTURE) ×3 IMPLANT
SUT ETHIBON 2 0 V 52N 30 (SUTURE) ×6 IMPLANT
SUT ETHIBON EXCEL 2-0 V-5 (SUTURE) IMPLANT
SUT ETHIBOND 2 0 SH (SUTURE)
SUT ETHIBOND 2 0 SH 36X2 (SUTURE) IMPLANT
SUT ETHIBOND 2 0 V4 (SUTURE) IMPLANT
SUT ETHIBOND 2 0V4 GREEN (SUTURE) IMPLANT
SUT ETHIBOND 4 0 RB 1 (SUTURE) IMPLANT
SUT ETHIBOND V-5 VALVE (SUTURE) IMPLANT
SUT ETHIBOND X763 2 0 SH 1 (SUTURE) ×9 IMPLANT
SUT MNCRL AB 3-0 PS2 18 (SUTURE) ×6 IMPLANT
SUT MNCRL AB 4-0 PS2 18 (SUTURE) ×3 IMPLANT
SUT PDS AB 1 CTX 36 (SUTURE) ×6 IMPLANT
SUT PROLENE 2 0 SH DA (SUTURE) IMPLANT
SUT PROLENE 3 0 SH DA (SUTURE) ×9 IMPLANT
SUT PROLENE 3 0 SH1 36 (SUTURE) ×3 IMPLANT
SUT PROLENE 4 0 RB 1 (SUTURE) ×21
SUT PROLENE 4 0 SH DA (SUTURE) ×3 IMPLANT
SUT PROLENE 4-0 RB1 .5 CRCL 36 (SUTURE) ×42 IMPLANT
SUT PROLENE 5 0 C 1 36 (SUTURE) IMPLANT
SUT PROLENE 6 0 C 1 30 (SUTURE) IMPLANT
SUT PROLENE 7.0 RB 3 (SUTURE) ×9 IMPLANT
SUT PROLENE 8 0 BV175 6 (SUTURE) IMPLANT
SUT PROLENE BLUE 7 0 (SUTURE) ×3 IMPLANT
SUT PROLENE POLY MONO (SUTURE) IMPLANT
SUT SILK  1 MH (SUTURE) ×2
SUT SILK 1 MH (SUTURE) ×4 IMPLANT
SUT SILK 2 0 SH CR/8 (SUTURE) IMPLANT
SUT SILK 3 0 SH CR/8 (SUTURE) IMPLANT
SUT STEEL 6MS V (SUTURE) IMPLANT
SUT STEEL STERNAL CCS#1 18IN (SUTURE) IMPLANT
SUT STEEL SZ 6 DBL 3X14 BALL (SUTURE) IMPLANT
SUT VIC AB 1 CTX 36 (SUTURE)
SUT VIC AB 1 CTX36XBRD ANBCTR (SUTURE) IMPLANT
SUT VIC AB 2-0 CT1 27 (SUTURE) ×1
SUT VIC AB 2-0 CT1 TAPERPNT 27 (SUTURE) ×2 IMPLANT
SUT VIC AB 2-0 CTX 27 (SUTURE) IMPLANT
SUT VIC AB 3-0 SH 27 (SUTURE)
SUT VIC AB 3-0 SH 27X BRD (SUTURE) IMPLANT
SUT VIC AB 3-0 X1 27 (SUTURE) IMPLANT
SUT VICRYL 4-0 PS2 18IN ABS (SUTURE) IMPLANT
SYSTEM SAHARA CHEST DRAIN ATS (WOUND CARE) ×6 IMPLANT
TAPE CLOTH SURG 4X10 WHT LF (GAUZE/BANDAGES/DRESSINGS) ×3 IMPLANT
TAPE PAPER 2X10 WHT MICROPORE (GAUZE/BANDAGES/DRESSINGS) ×3 IMPLANT
TOWEL GREEN STERILE (TOWEL DISPOSABLE) ×3 IMPLANT
TOWEL GREEN STERILE FF (TOWEL DISPOSABLE) ×3 IMPLANT
TRAY FOLEY SLVR 16FR TEMP STAT (SET/KITS/TRAYS/PACK) ×3 IMPLANT
TUBE CONNECTING 20X1/4 (TUBING) ×3 IMPLANT
TUBING LAP HI FLOW INSUFFLATIO (TUBING) ×3 IMPLANT
UNDERPAD 30X36 HEAVY ABSORB (UNDERPADS AND DIAPERS) ×3 IMPLANT
VALVE AORTIC SZ21 INSP/RESIL (Valve) ×3 IMPLANT
VENT LEFT HEART 12002 (CATHETERS) ×3
VRC MALLEABLE SINGLE STG 36FR (MISCELLANEOUS) ×3
WATER STERILE IRR 1000ML POUR (IV SOLUTION) ×6 IMPLANT
YANKAUER SUCT BULB TIP NO VENT (SUCTIONS) ×3 IMPLANT

## 2020-07-19 SURGICAL SUPPLY — 102 items
BAG DECANTER FOR FLEXI CONT (MISCELLANEOUS) IMPLANT
BLADE CLIPPER SURG (BLADE) IMPLANT
BLADE STERNUM SYSTEM 6 (BLADE) IMPLANT
BNDG ELASTIC 4X5.8 VLCR STR LF (GAUZE/BANDAGES/DRESSINGS) IMPLANT
BNDG ELASTIC 6X5.8 VLCR STR LF (GAUZE/BANDAGES/DRESSINGS) IMPLANT
BNDG GAUZE ELAST 4 BULKY (GAUZE/BANDAGES/DRESSINGS) IMPLANT
CANISTER SUCT 3000ML PPV (MISCELLANEOUS) ×2 IMPLANT
CANNULA EZ GLIDE AORTIC 21FR (CANNULA) IMPLANT
CATH CPB KIT OWEN (MISCELLANEOUS) IMPLANT
CATH EMB 6FR 80CM (CATHETERS) ×2 IMPLANT
CATH THORACIC 36FR (CATHETERS) IMPLANT
CLIP RETRACTION 3.0MM CORONARY (MISCELLANEOUS) ×2 IMPLANT
CLIP VESOCCLUDE MED 24/CT (CLIP) IMPLANT
CLIP VESOCCLUDE SM WIDE 24/CT (CLIP) IMPLANT
DRAIN CHANNEL 32F RND 10.7 FF (WOUND CARE) IMPLANT
DRAPE CARDIOVASCULAR INCISE (DRAPES) ×1
DRAPE INCISE IOBAN 66X45 STRL (DRAPES) ×2 IMPLANT
DRAPE SLUSH/WARMER DISC (DRAPES) ×2 IMPLANT
DRAPE SRG 135X102X78XABS (DRAPES) ×1 IMPLANT
DRSG AQUACEL AG ADV 3.5X14 (GAUZE/BANDAGES/DRESSINGS) ×4 IMPLANT
ELECT BLADE 4.0 EZ CLEAN MEGAD (MISCELLANEOUS) ×2
ELECT REM PT RETURN 9FT ADLT (ELECTROSURGICAL) ×4
ELECTRODE BLDE 4.0 EZ CLN MEGD (MISCELLANEOUS) ×1 IMPLANT
ELECTRODE REM PT RTRN 9FT ADLT (ELECTROSURGICAL) ×2 IMPLANT
FELT TEFLON 1X6 (MISCELLANEOUS) ×2 IMPLANT
FIBERTAPE STERNAL CLSR 2 36IN (SUTURE) ×8 IMPLANT
FIBERTAPE STERNAL CLSR 2X36 (SUTURE) ×8 IMPLANT
GAUZE SPONGE 4X4 12PLY STRL (GAUZE/BANDAGES/DRESSINGS) ×2 IMPLANT
GLOVE BIOGEL M 6.5 STRL (GLOVE) ×2 IMPLANT
GLOVE BIOGEL M 7.0 STRL (GLOVE) ×2 IMPLANT
GLOVE BIOGEL PI IND STRL 6.5 (GLOVE) ×3 IMPLANT
GLOVE BIOGEL PI IND STRL 7.5 (GLOVE) ×2 IMPLANT
GLOVE BIOGEL PI INDICATOR 6.5 (GLOVE) ×3
GLOVE BIOGEL PI INDICATOR 7.5 (GLOVE) ×2
GLOVE ORTHO TXT STRL SZ7.5 (GLOVE) ×4 IMPLANT
GLOVE SURG SS PI 7.5 STRL IVOR (GLOVE) IMPLANT
GOWN STRL REUS W/ TWL LRG LVL3 (GOWN DISPOSABLE) ×4 IMPLANT
GOWN STRL REUS W/TWL LRG LVL3 (GOWN DISPOSABLE) ×4
HEMOSTAT POWDER SURGIFOAM 1G (HEMOSTASIS) IMPLANT
INSERT FOGARTY XLG (MISCELLANEOUS) ×2 IMPLANT
KIT BASIN OR (CUSTOM PROCEDURE TRAY) ×2 IMPLANT
KIT SUCTION CATH 14FR (SUCTIONS) ×2 IMPLANT
KIT TURNOVER KIT B (KITS) ×2 IMPLANT
KIT VASOVIEW HEMOPRO 2 VH 4000 (KITS) ×2 IMPLANT
LEAD PACING MYOCARDI (MISCELLANEOUS) IMPLANT
MARKER GRAFT CORONARY BYPASS (MISCELLANEOUS) IMPLANT
NDL SUT PASSING CERCLAGE MED (SUTURE) ×2
NEEDLE SUT PASSING CERCLAG MED (SUTURE) ×1 IMPLANT
NS IRRIG 1000ML POUR BTL (IV SOLUTION) ×10 IMPLANT
PACK E OPEN HEART (SUTURE) ×2 IMPLANT
PACK OPEN HEART (CUSTOM PROCEDURE TRAY) ×2 IMPLANT
PAD ARMBOARD 7.5X6 YLW CONV (MISCELLANEOUS) ×4 IMPLANT
PAD ELECT DEFIB RADIOL ZOLL (MISCELLANEOUS) ×2 IMPLANT
PENCIL BUTTON HOLSTER BLD 10FT (ELECTRODE) ×2 IMPLANT
POSITIONER HEAD DONUT 9IN (MISCELLANEOUS) ×2 IMPLANT
POWDER SURGICEL 3.0 GRAM (HEMOSTASIS) ×2 IMPLANT
PUNCH AORTIC ROTATE 4.0MM (MISCELLANEOUS) IMPLANT
PUNCH AORTIC ROTATE 4.5MM 8IN (MISCELLANEOUS) IMPLANT
PUNCH AORTIC ROTATE 5MM 8IN (MISCELLANEOUS) IMPLANT
SOL ANTI FOG 6CC (MISCELLANEOUS) IMPLANT
SOLUTION ANTI FOG 6CC (MISCELLANEOUS)
SPONGE LAP 18X18 RF (DISPOSABLE) IMPLANT
SPONGE LAP 4X18 RFD (DISPOSABLE) IMPLANT
SUPPORT HEART JANKE-BARRON (MISCELLANEOUS) ×2 IMPLANT
SUT BONE WAX W31G (SUTURE) ×2 IMPLANT
SUT ETHIBOND X763 2 0 SH 1 (SUTURE) ×4 IMPLANT
SUT MNCRL AB 3-0 PS2 18 (SUTURE) ×4 IMPLANT
SUT MNCRL AB 4-0 PS2 18 (SUTURE) IMPLANT
SUT PDS AB 1 CTX 36 (SUTURE) ×4 IMPLANT
SUT PROLENE 2 0 SH DA (SUTURE) IMPLANT
SUT PROLENE 3 0 SH DA (SUTURE) ×4 IMPLANT
SUT PROLENE 3 0 SH1 36 (SUTURE) IMPLANT
SUT PROLENE 4 0 RB 1 (SUTURE) ×1
SUT PROLENE 4 0 SH DA (SUTURE) IMPLANT
SUT PROLENE 4-0 RB1 .5 CRCL 36 (SUTURE) ×1 IMPLANT
SUT PROLENE 5 0 C 1 36 (SUTURE) IMPLANT
SUT PROLENE 6 0 C 1 30 (SUTURE) IMPLANT
SUT PROLENE 7.0 RB 3 (SUTURE) IMPLANT
SUT PROLENE 8 0 BV175 6 (SUTURE) IMPLANT
SUT PROLENE BLUE 7 0 (SUTURE) ×2 IMPLANT
SUT PROLENE POLY MONO (SUTURE) IMPLANT
SUT SILK  1 MH (SUTURE) ×1
SUT SILK 1 MH (SUTURE) ×1 IMPLANT
SUT STEEL 6MS V (SUTURE) IMPLANT
SUT STEEL STERNAL CCS#1 18IN (SUTURE) IMPLANT
SUT STEEL SZ 6 DBL 3X14 BALL (SUTURE) IMPLANT
SUT VIC AB 1 CTX 36 (SUTURE)
SUT VIC AB 1 CTX36XBRD ANBCTR (SUTURE) IMPLANT
SUT VIC AB 2-0 CT1 27 (SUTURE)
SUT VIC AB 2-0 CT1 TAPERPNT 27 (SUTURE) IMPLANT
SUT VIC AB 2-0 CTX 27 (SUTURE) IMPLANT
SUT VIC AB 3-0 SH 27 (SUTURE)
SUT VIC AB 3-0 SH 27X BRD (SUTURE) IMPLANT
SUT VIC AB 3-0 X1 27 (SUTURE) IMPLANT
SUT VICRYL 4-0 PS2 18IN ABS (SUTURE) IMPLANT
SYSTEM SAHARA CHEST DRAIN ATS (WOUND CARE) ×4 IMPLANT
TOWEL GREEN STERILE (TOWEL DISPOSABLE) ×2 IMPLANT
TOWEL GREEN STERILE FF (TOWEL DISPOSABLE) ×2 IMPLANT
TRAY FOLEY SLVR 16FR TEMP STAT (SET/KITS/TRAYS/PACK) ×2 IMPLANT
TUBING LAP HI FLOW INSUFFLATIO (TUBING) ×2 IMPLANT
UNDERPAD 30X36 HEAVY ABSORB (UNDERPADS AND DIAPERS) ×2 IMPLANT
WATER STERILE IRR 1000ML POUR (IV SOLUTION) ×4 IMPLANT

## 2020-07-19 NOTE — Progress Notes (Signed)
Patient's wedding band given to Spouse, Vaughan Basta.

## 2020-07-19 NOTE — Transfer of Care (Signed)
Immediate Anesthesia Transfer of Care Note  Patient: Neil [name]  Procedure(s) Performed: CORONARY ARTERY BYPASS GRAFTING (CABG), ON PUMP, TIMES FOUR, USING LEFT INTERNAL MAMMARY ARTERY AND ENDOSCOPICALLY HARVESTED LEFT GREATER SAPHENOUS VEIN (N/A Chest) AORTIC VALVE REPLACEMENT (AVR) USING INSPIRIS RESILIA 21MM AORTIC VALVE (N/A Chest) TRANSESOPHAGEAL ECHOCARDIOGRAM (TEE) (N/A ) AORTIC ROOT ENLARGEMENT USING 6CM x 8CM PERI-GUARD BOVINE PERICARDIUM PATCH (N/A Chest)  Patient Location: ICU  Anesthesia Type:General  Level of Consciousness: sedated and Patient remains intubated per anesthesia plan  Airway & Oxygen Therapy: Patient remains intubated per anesthesia plan and Patient placed on Ventilator (see vital sign flow sheet for setting)  Post-op Assessment: Report given to RN and Post -op Vital signs reviewed and stable  Post vital signs: Reviewed and stable  Last Vitals:  Vitals Value Taken Time  BP    Temp    Pulse    Resp    SpO2      Last Pain:  Vitals:   07/19/20 0629  PainSc: 0-No pain         Complications: No complications documented.

## 2020-07-19 NOTE — Anesthesia Preprocedure Evaluation (Signed)
Anesthesia Evaluation  Patient identified by MRN, date of birth, ID band Patient unresponsive    Reviewed: Allergy & Precautions, H&P , NPO status , Patient's Chart, lab work & pertinent test results  Airway Mallampati: Intubated       Dental no notable dental hx. (+) Teeth Intact, Dental Advisory Given   Pulmonary neg pulmonary ROS,    Pulmonary exam normal breath sounds clear to auscultation       Cardiovascular + CAD and + CABG   Rhythm:Regular Rate:Normal     Neuro/Psych Anxiety Depression Schizophrenia negative neurological ROS     GI/Hepatic negative GI ROS, Neg liver ROS,   Endo/Other  negative endocrine ROS  Renal/GU negative Renal ROS  negative genitourinary   Musculoskeletal   Abdominal   Peds  Hematology negative hematology ROS (+)   Anesthesia Other Findings   Reproductive/Obstetrics negative OB ROS                             Anesthesia Physical Anesthesia Plan  ASA: IV and emergent  Anesthesia Plan: General   Post-op Pain Management:    Induction: Intravenous  PONV Risk Score and Plan: 2 and Treatment may vary due to age or medical condition  Airway Management Planned: Oral ETT  Additional Equipment:   Intra-op Plan:   Post-operative Plan: Post-operative intubation/ventilation  Informed Consent: I have reviewed the patients History and Physical, chart, labs and discussed the procedure including the risks, benefits and alternatives for the proposed anesthesia with the patient or authorized representative who has indicated his/her understanding and acceptance.     Dental advisory given  Plan Discussed with: CRNA  Anesthesia Plan Comments:         Anesthesia Quick Evaluation

## 2020-07-19 NOTE — Anesthesia Postprocedure Evaluation (Signed)
Anesthesia Post Note  Patient: TANIS BURNLEY  Procedure(s) Performed: CORONARY ARTERY BYPASS GRAFTING (CABG), ON PUMP, TIMES FOUR, USING LEFT INTERNAL MAMMARY ARTERY AND ENDOSCOPICALLY HARVESTED LEFT GREATER SAPHENOUS VEIN (N/A Chest) AORTIC VALVE REPLACEMENT (AVR) USING INSPIRIS RESILIA 21MM AORTIC VALVE (N/A Chest) TRANSESOPHAGEAL ECHOCARDIOGRAM (TEE) (N/A ) AORTIC ROOT ENLARGEMENT USING 6CM x 8CM PERI-GUARD BOVINE PERICARDIUM PATCH (N/A Chest)     Patient location during evaluation: SICU Anesthesia Type: General Level of consciousness: sedated Pain management: pain level controlled Vital Signs Assessment: post-procedure vital signs reviewed and stable Respiratory status: patient remains intubated per anesthesia plan Cardiovascular status: stable Postop Assessment: no apparent nausea or vomiting Anesthetic complications: no   No complications documented.  Last Vitals:  Vitals:   07/19/20 0640 07/19/20 1515  BP:  (!) 123/55  Pulse: (!) 59 (!) 107  Resp:  12  Temp:    SpO2:  100%    Last Pain:  Vitals:   07/19/20 0629  PainSc: 0-No pain                 Tristen Luce,W. EDMOND

## 2020-07-19 NOTE — Anesthesia Procedure Notes (Signed)
Central Venous Catheter Insertion Performed by: Roderic Palau, MD, anesthesiologist Start/End8/05/2020 6:40 AM, 07/19/2020 6:55 AM Patient location: Pre-op. Preanesthetic checklist: patient identified, IV checked, site marked, risks and benefits discussed, surgical consent, monitors and equipment checked, pre-op evaluation, timeout performed and anesthesia consent Position: Trendelenburg Lidocaine 1% used for infiltration and patient sedated Hand hygiene performed , maximum sterile barriers used  and Seldinger technique used Catheter size: 9 Fr Total catheter length 10. Central line was placed.MAC introducer Procedure performed using ultrasound guided technique. Ultrasound Notes:anatomy identified, needle tip was noted to be adjacent to the nerve/plexus identified, no ultrasound evidence of intravascular and/or intraneural injection and image(s) printed for medical record Attempts: 1 Following insertion, line sutured, dressing applied and Biopatch. Post procedure assessment: blood return through all ports, free fluid flow and no air  Patient tolerated the procedure well with no immediate complications.

## 2020-07-19 NOTE — Brief Op Note (Signed)
07/19/2020  2:38 PM  PATIENT:  Neil [name]  79 y.o. male  PRE-OPERATIVE DIAGNOSIS:  CORONARY ARTERY DISEASE SEVERE AORTIC STENOSIS  POST-OPERATIVE DIAGNOSIS:  CORONARY ARTERY DISEASE SEVERE AORTIC STENOSIS  PROCEDURE:  Procedure(s): CORONARY ARTERY BYPASS GRAFTING (CABG), ON PUMP, TIMES FOUR, USING LEFT INTERNAL MAMMARY ARTERY AND ENDOSCOPICALLY HARVESTED LEFT GREATER SAPHENOUS VEIN (N/A) AORTIC VALVE REPLACEMENT (AVR) USING INSPIRIS RESILIA 21MM AORTIC VALVE (N/A) TRANSESOPHAGEAL ECHOCARDIOGRAM (TEE) (N/A) AORTIC ROOT ENLARGEMENT USING 6CM x 8CM PERI-GUARD BOVINE PERICARDIUM PATCH (N/A)  SURGEON:  Surgeon(s) and Role:    * Rexene Alberts, MD - Primary  PHYSICIAN ASSISTANT:   John Giovanni, PA-C  ANESTHESIA:   general  EBL:  Per perfusion record  BLOOD ADMINISTERED:2 units CC PRBC  DRAINS: 4 Chest Tube(s) in the mediastinum and both pleural spaces   LOCAL MEDICATIONS USED:  NONE  SPECIMEN:  Source of Specimen:  aortic valve  DISPOSITION OF SPECIMEN:  PATHOLOGY  COUNTS:  YES  TOURNIQUET:  * No tourniquets in log *  DICTATION: .Note written in EPIC  PLAN OF CARE: Admit to inpatient   PATIENT DISPOSITION:  ICU - intubated and hemodynamically stable.   Delay start of Pharmacological VTE agent (>24hrs) due to surgical blood loss or risk of bleeding: yes

## 2020-07-19 NOTE — Progress Notes (Signed)
      [address]       [place] [address]             [phone]      S/p AVR, CABG, take back for bleeding  Just arrived back from OR BP (!) 154/88   Pulse 80   Temp (!) 94.1 F (34.5 C) (Core)   Resp 12   Ht 5\' 5"  (1.651 m)   Wt 58.5 kg   SpO2 98%   BMI 21.47 kg/m   Minimal CT output  Follow closely  Remo Lipps C. Roxan Hockey, MD [facility] [phone]

## 2020-07-19 NOTE — Transfer of Care (Signed)
Immediate Anesthesia Transfer of Care Note  Patient: Neil [name]  Procedure(s) Performed: EXPLORATION POST OPERATIVE OPEN HEART (N/A Chest)  Patient Location: ICU  Anesthesia Type:General  Level of Consciousness: Patient remains intubated per anesthesia plan  Airway & Oxygen Therapy: Patient remains intubated per anesthesia plan and Patient placed on Ventilator (see vital sign flow sheet for setting)  Post-op Assessment: Report given to RN and Post -op Vital signs reviewed and stable  Post vital signs: Reviewed and stable  Last Vitals:  Vitals Value Taken Time  BP 154/88 07/19/20 1855  Temp 34.4 C 07/19/20 1859  Pulse 80 07/19/20 1859  Resp 13 07/19/20 1859  SpO2 98 % 07/19/20 1859  Vitals shown include unvalidated device data.  Last Pain:  Vitals:   07/19/20 1700  TempSrc: Core  PainSc:          Complications: No complications documented.

## 2020-07-19 NOTE — Anesthesia Procedure Notes (Signed)
Arterial Line Insertion Start/End8/05/2020 7:00 AM, 07/19/2020 7:05 AM Performed by: Clearnce Sorrel, CRNA  Patient location: Pre-op. Preanesthetic checklist: patient identified, IV checked, site marked, risks and benefits discussed, surgical consent, monitors and equipment checked, pre-op evaluation, timeout performed and anesthesia consent Lidocaine 1% used for infiltration Left, radial was placed Catheter size: 20 Fr Hand hygiene performed  and maximum sterile barriers used   Attempts: 1 Procedure performed without using ultrasound guided technique. Following insertion, dressing applied. Post procedure assessment: normal and unchanged

## 2020-07-19 NOTE — Op Note (Addendum)
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 Roxy Manns, MD  Assistant:    Alcide Evener, CRNFA  Anesthesia:    Arabella Merles  Operative Findings:   Diffuse coagulopathy with bleeding from chest wall    DETAILS OF THE OPERATIVE PROCEDURE  The patient is brought to the operating room on the above mentioned date and placed in the supine position on the operating table.  General endotracheal anesthesia is maintained and monitored under the care and direction of Dr. Ola Spurr.  Intravenous antibiotics were administered.  The patient's existing surgical dressings were removed and the anterior chest is prepared and draped in a sterile manner.  The patient's median sternotomy incision is reopened.  The Fibertape cerlage closure is removed.  A retractor is placed and the mediastinum examined.  There is minimal blood within the pericardial space.  There is some blood in the anterior mediastinum and a large amount in the left pleural space.  All of the blood in the left pleural space is evacuated.  Careful examination of the left chest is performed.  There is no bleeding along the mammary pedicle but there is some bleeding along the chest wall from the mammary bed which is directly cauterized.  The anterior mediastinum is examined and no significant surgical sites of bleeding are identified.  A retractor is placed in pericardial sutures placed to carefully examine the structures of the mediastinum.  There is no bleeding along the aorta nor any of the bypass grafts.  Throughout the procedure the patient is resuscitated with blood products by the anesthesia team.  The patient received 1 pack of platelets and 4 units  fresh frozen plasma.  A single  1/2dose of recombinant NovoSeven is administered.   Coagulopathy appears to resolve.  The patient received a total of 3 units packed red blood cells for acute blood loss anemia.  The mediastinum and the left pleural space is irrigated with copious warm saline solution.  Meticulous hemostasis is ascertained.  The sternum was reclosed using Fibertape cerclage.  Soft tissues anterior to the sternum are closed multiple layers and the skin incision closed with running subcuticular skin closure.  The patient is transported back to the surgical intensive care unit in stable condition.  There are no intraoperative complications.  All sponge instrument and needle counts are verified correct.       Valentina Gu. Roxy Manns MD 07/19/2020 6:40 PM

## 2020-07-19 NOTE — Progress Notes (Signed)
  Echocardiogram Echocardiogram Transesophageal has been performed.  Neil [name] 07/19/2020, 8:22 AM

## 2020-07-19 NOTE — H&P (Signed)
[address]       [place] [address]             [phone]          CARDIOTHORACIC SURGERY HISTORY AND PHYSICAL EXAM  Referring Provider is Hilty, Nadean Corwin, MD PCP is Antony Contras, MD  Chief Complaint  Patient presents with  . Aortic Stenosis    new patient consultation review all studies  . 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  Patient states that he has been told that he has had a heart murmur nearly all of his life.  He has been followed in the past by his primary care physician.  Recent follow-up transthoracic echocardiogram revealed findings consistent with severe aortic stenosis and preserved left ventricular function.  The patient was referred for cardiology consultation and initially evaluated by Dr. Debara Pickett.  Repeat echocardiogram was performed May 28, 2020 and revealed findings consistent with very severe aortic stenosis including peak velocity across aortic valve measured nearly 5.5 m/s corresponding to mean transvalvular gradient estimated 77 mmHg and aortic valve area calculated only 0.59 cm with DVI reported 0.19.  Left ventricular systolic function remain normal with ejection fraction estimated 60 to 65%.  The patient was referred to the multidisciplinary heart valve clinic and has been evaluated previously by Dr. Burt Knack.  Diagnostic cardiac catheterization was performed June 26, 2020 and revealed significant left main and three-vessel coronary artery disease.  The aortic valve was not crossed at the time of catheterization.  Right heart pressures were normal.  CT angiography was performed and cardiothoracic surgical consultation requested.  Patient is married and lives  locally in [place] with his wife.  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  Approximately 2 years ago he stopped this because of worsening fatigue.  Since that time the patient has complained of having absolutely no energy.  He stopped doing things outside of the house as he found that he just could not do much of anything without being immediately exhausted.  Symptoms were blamed on medications used for depression.  The patient lost his appetite approximately 7 months ago and has apparently lost about 25 pounds in weight.  He has become progressively weak and fatigued.  He continues to deny any symptoms of exertional shortness of breath or chest discomfort, but he admits that he does not do much of anything at home.  He denies any history of PND, orthopnea, or lower extremity edema.  He has not had dizzy spells or syncope.  He reportedly has some difficulty walking due to poor balance and generalized weakness.  Patient was originally seen in consultation on July 08, 2020.  Since then the patient underwent routine blood work, carotid duplex scan, and a formal physical therapy evaluation.  Blood work revealed normal serum albumin with slightly reduced prealbumin and normal electrolytes with exception of mild hyponatremia.  Carotid duplex scan revealed no signs of significant extracranial cerebrovascular disease.  On physical examination the patient's lower extremities displayed mild weakness (grade 4+ out of 5) with shuffling gait.  The patient ambulated 565 feet during a 6-minute walk test  with what was felt to be fairly light rate of exertion.  Based on the short physical performance battery the patient's frailty rating was graded 8/12 with less than 5/12 considered frail.  The patient and his wife return to the office today to discuss treatment options further.  The patient's only new  concern over the past 2 weeks is that he feels he is not tolerating oral Lipitor due to change in bowel function.  He has stopped taking it.   Past Medical History:  Diagnosis Date  . Anxiety   . CAD (coronary artery disease)   . Depression   . Dyslipidemia   . Schizophrenia ([place])   . Severe aortic stenosis     Past Surgical History:  Procedure Laterality Date  . EYE SURGERY     Bialteral lower eye lids  . FOOT SURGERY Left   . LIPOMA EXCISION    . RIGHT AND LEFT HEART CATH N/A 06/26/2020   Procedure: RIGHT AND LEFT HEART CATH;  Surgeon: Sherren Mocha, MD;  Location: [facility];  Service: Cardiovascular;  Laterality: N/A;    Family History  Problem Relation Age of Onset  . Hypertension Mother   . Heart attack Father   . Hypertension Sister   . Hypertension Brother     Social History Social History   Tobacco Use  . Smoking status: Never Smoker  . Smokeless tobacco: Never Used  Vaping Use  . Vaping Use: Never used  Substance Use Topics  . Alcohol use: No  . Drug use: Not on file    Prior to Admission medications   Medication Sig Start Date End Date Taking? Authorizing Provider  Deutetrabenazine ([name]) 9 MG TABS Take 18 mg by mouth in the morning and at bedtime. 06/12/20  Yes Cottle, Billey Co., MD  hydrOXYzine (ATARAX/VISTARIL) 25 MG tablet TAKE 1 TABLET BY MOUTH AT BEDTIME AS NEEDED Patient taking differently: Take 25 mg by mouth at bedtime.  06/27/20  Yes Cottle, Billey Co., MD  LORazepam (ATIVAN) 0.5 MG tablet Take 1 tablet (0.5 mg total) by mouth 4 (four) times daily. 07/02/20  Yes Cottle, Billey Co., MD  Multiple Vitamin (MULTIVITAMIN WITH MINERALS) TABS tablet Take 2 tablets by mouth daily. 50 +   Yes [provider]  sennosides-docusate sodium (SENOKOT-S) 8.6-50 MG tablet Take 1 tablet by mouth daily with supper.   Yes [provider]  sertraline (ZOLOFT) 50 MG tablet TAKE 1 AND 1/2 TABLETS DAILY BY MOUTH Patient taking  differently: Take 75 mg by mouth daily.  06/11/20  Yes Cottle, Billey Co., MD  VITAMIN D PO Take 1 capsule by mouth daily.   Yes [provider]  atorvastatin (LIPITOR) 80 MG tablet Take 1 tablet (80 mg total) by mouth daily. Patient not taking: Reported on 07/16/2020 07/03/20 10/01/20  Pixie Casino, MD    Allergies  Allergen Reactions  . Aspirin Swelling and Other (See Comments)    Tongue swelling   . Sulfur Other (See Comments)    Reaction:  Unknown   . Tetanus Toxoids Swelling and Other (See Comments)    Arm swelling     Review of Systems:              General:                      poor appetite, decreased energy, no weight gain, + weight loss, no fever  Cardiac:                       no chest pain with exertion, no chest pain at rest, no SOB with exertion, no resting SOB, no PND, no orthopnea, no palpitations, no arrhythmia, no atrial fibrillation, no LE edema, no dizzy spells, no syncope             Respiratory:                 no shortness of breath, no home oxygen, no productive cough, no dry cough, no bronchitis, no wheezing, no hemoptysis, no asthma, no pain with inspiration or cough, no sleep apnea, no CPAP at night             GI:                               no difficulty swallowing, no reflux, no frequent heartburn, no hiatal hernia, no abdominal pain, no constipation, no diarrhea, no hematochezia, no hematemesis, no melena             GU:                              no dysuria,  no frequency, no urinary tract infection, no hematuria, + enlarged prostate, no kidney stones, no kidney disease             Vascular:                     no pain suggestive of claudication, no pain in feet, no leg cramps, no varicose veins, no DVT, no non-healing foot ulcer             Neuro:                         no stroke, no TIA's, no seizures, no headaches, no temporary blindness one eye,  no slurred speech, no peripheral neuropathy, no chronic pain, some instability of  gait, mild memory/cognitive dysfunction             Musculoskeletal:         no arthritis, no joint swelling, no myalgias, some difficulty walking, limited mobility              Skin:                            no rash, no itching, no skin infections, no pressure sores or ulcerations             Psych:                         + anxiety, + depression, no nervousness, no unusual recent stress             Eyes:                           no blurry vision, no floaters, no recent vision changes, + wears glasses or contacts             ENT:                            +  hearing loss, no loose or painful teeth, no dentures, last saw dentist October 2020             Hematologic:               no easy bruising, no abnormal bleeding, no clotting disorder, no frequent epistaxis             Endocrine:                   no diabetes, does not check CBG's at home                                                       Physical Exam:              BP (!) 109/64 (BP Location: Left Arm, Patient Position: Sitting, Cuff Size: Normal)   Pulse 71   Resp 20   Wt 125 lb (56.7 kg)   SpO2 94% Comment: RA  BMI 20.80 kg/m              General:                      Thin, frail-appearing             HEENT:                       Unremarkable              Neck:                           no JVD, no bruits, no adenopathy              Chest:                          clear to auscultation, symmetrical breath sounds, no wheezes, no rhonchi              CV:                              RRR, grade III/VI late-peaking crescendo/decrescendo murmur heard best at RSB,  no diastolic murmur             Abdomen:                    soft, non-tender, no masses              Extremities:                 warm, well-perfused, pulses thready but palpable, no LE edema             Rectal/GU                   Deferred             Neuro:                         Grossly non-focal and symmetrical throughout             Skin:  Clean and dry but frail-appearing, no rashes, no breakdown   Diagnostic Tests:   ECHOCARDIOGRAM REPORT       Patient Name:  DONTAVION NOXON Date of Exam: 05/28/2020  Medical Rec #: [id number]    Height:    65.0 in  Accession #:  [id number]   Weight:    134.6 lb  Date of Birth: 08-12-1941    BSA:     1.672 m  Patient Age:  61 years    BP:      122/75 mmHg  Patient Gender: M        HR:      69 bpm.  Exam Location: [facility]   Procedure: 2D Echo, Cardiac Doppler and Color Doppler   Indications:  I35.9 Aortic Valve Disorder    History:    Patient has no prior history of Echocardiogram  examinations.         Risk Factors:Dyslipidemia. Murmur.    Sonographer:  Wilford Sports Rodgers-Jones RDCS  Referring Phys: [id number] KENNETH C HILTY   IMPRESSIONS    1. Very severe aortic stenosis is present (V max 5.45 m/s, MG 77 mmHG,  AVA 0.59 cm2, DI 0.19). The aortic valve is tricuspid. Aortic valve  regurgitation is trivial. Severe aortic valve stenosis. Aortic valve area,  by VTI measures 0.59 cm. Aortic valve  mean gradient measures 77.0 mmHg. Aortic valve Vmax measures 5.45 m/s.  2. Left ventricular ejection fraction, by estimation, is 60 to 65%. The  left ventricle has normal function. The left ventricle has no regional  wall motion abnormalities. There is mild concentric left ventricular  hypertrophy. Left ventricular diastolic  function could not be evaluated.  3. Right ventricular systolic function is normal. The right ventricular  size is normal. Tricuspid regurgitation signal is inadequate for assessing  PA pressure.  4. The mitral valve is grossly normal. Trivial mitral valve  regurgitation. No evidence of mitral stenosis.  5. The inferior vena cava is normal in size with greater than 50%  respiratory variability, suggesting right atrial pressure of 3 mmHg.   Conclusion(s)/Recommendation(s):  Patient already being evaluated for TAVR.   FINDINGS  Left Ventricle: Left ventricular ejection fraction, by estimation, is 60  to 65%. The left ventricle has normal function. The left ventricle has no  regional wall motion abnormalities. The left ventricular internal cavity  size was normal in size. There is  mild concentric left ventricular hypertrophy. Left ventricular diastolic  function could not be evaluated due to mitral annular calcification  (moderate or greater). Left ventricular diastolic function could not be  evaluated.   Right Ventricle: The right ventricular size is normal. No increase in  right ventricular wall thickness. Right ventricular systolic function is  normal. Tricuspid regurgitation signal is inadequate for assessing PA  pressure.   Left Atrium: Left atrial size was normal in size.   Right Atrium: Right atrial size was normal in size.   Pericardium: Trivial pericardial effusion is present.   Mitral Valve: The mitral valve is grossly normal. Mild to moderate mitral  annular calcification. Trivial mitral valve regurgitation. No evidence of  mitral valve stenosis.   Tricuspid Valve: The tricuspid valve is grossly normal. Tricuspid valve  regurgitation is trivial. No evidence of tricuspid stenosis.   Aortic Valve: Very severe aortic stenosis is present (V max 5.45 m/s, MG  77 mmHG, AVA 0.59 cm2, DI 0.19). The aortic valve is tricuspid. . There is  severe thickening and severe calcifcation of the aortic valve. Aortic  valve  regurgitation is trivial.  Severe aortic stenosis is present. There is severe thickening of the  aortic valve. There is severe calcifcation of the aortic valve. Aortic  valve mean gradient measures 77.0 mmHg. Aortic valve peak gradient  measures 118.8 mmHg. Aortic valve area, by VTI  measures 0.59 cm.   Pulmonic Valve: The pulmonic valve was grossly normal. Pulmonic valve  regurgitation is not visualized. No evidence of pulmonic  stenosis.   Aorta: The aortic root and ascending aorta are structurally normal, with  no evidence of dilitation.   Venous: The inferior vena cava is normal in size with greater than 50%  respiratory variability, suggesting right atrial pressure of 3 mmHg.   IAS/Shunts: The atrial septum is grossly normal.     LEFT VENTRICLE  PLAX 2D  LVIDd:     4.30 cm Diastology  LVIDs:     2.80 cm LV e' lateral:  5.22 cm/s  LV PW:     1.20 cm LV E/e' lateral: 19.5  LV IVS:    1.30 cm LV e' medial:  5.11 cm/s  LVOT diam:   2.00 cm LV E/e' medial: 20.0  LV SV:     78  LV SV Index:  47  LVOT Area:   3.14 cm     RIGHT VENTRICLE  RV Basal diam: 2.60 cm  RV S prime:   25.45 cm/s  TAPSE (M-mode): 2.3 cm   LEFT ATRIUM       Index    RIGHT ATRIUM      Index  LA diam:    3.90 cm 2.33 cm/m RA Area:   11.20 cm  LA Vol (A2C):  57.3 ml 34.28 ml/m RA Volume:  26.30 ml 15.73 ml/m  LA Vol (A4C):  38.8 ml 23.21 ml/m  LA Biplane Vol: 48.8 ml 29.19 ml/m  AORTIC VALVE  AV Area (Vmax):  0.49 cm  AV Area (Vmean):  0.40 cm  AV Area (VTI):   0.59 cm  AV Vmax:      545.00 cm/s  AV Vmean:     462.000 cm/s  AV VTI:      1.330 m  AV Peak Grad:   118.8 mmHg  AV Mean Grad:   77.0 mmHg  LVOT Vmax:     84.85 cm/s  LVOT Vmean:    58.250 cm/s  LVOT VTI:     0.248 m  LVOT/AV VTI ratio: 0.19    AORTA  Ao Root diam: 2.80 cm  Ao Asc diam: 2.70 cm   MITRAL VALVE  MV Area (PHT): 2.07 cm   SHUNTS  MV Decel Time: 366 msec   Systemic VTI: 0.25 m  MV E velocity: 102.00 cm/s Systemic Diam: 2.00 cm  MV A velocity: 144.00 cm/s  MV E/A ratio: 0.71   Eleonore Chiquito MD  Electronically signed by Eleonore Chiquito MD  Signature Date/Time: 05/28/2020/7:30:37 PM     RIGHT AND LEFT HEART CATH  Conclusion  1. A total occlusion of the RCA with left-to-right collaterals 2. Moderate ostial left main  stenosis estimated at 50% 3. Severe stenosis of the mid left circumflex/second obtuse marginal 4. Mild nonobstructive LAD stenosis 5. Known critical aortic stenosis with a calcified, severely restricted aortic valve on plain fluoroscopy  Recommendations: Continued multidisciplinary heart team evaluation for treatment of critical aortic stenosis and multivessel coronary artery disease. Indications  Severe aortic stenosis [I35.0 (ICD-10-CM)]  Procedural Details  Technical Details INDICATION: Critical aortic stenosis  PROCEDURAL DETAILS: There was an indwelling IV in a  right antecubital vein. Using normal sterile technique, the IV was changed out for a 5 Fr brachial sheath over a 0.018 inch wire. The right wrist was then prepped, draped, and anesthetized with 1% lidocaine. Using the modified Seldinger technique a 5/6 French Slender sheath was placed in the right radial artery. Intra-arterial verapamil was administered through the radial artery sheath. IV heparin was administered after a JR4 catheter was advanced into the central aorta. A Swan-Ganz catheter was used for the right heart catheterization. Standard protocol was followed for recording of right heart pressures and sampling of oxygen saturations. Fick cardiac output was calculated. Standard Judkins catheters were used for selective coronary angiography. No attempt was made to cross the aortic valve because of the patient's known critical aortic stenosis. There were no immediate procedural complications. The patient was transferred to the post catheterization recovery area for further monitoring.    Estimated blood loss <50 mL.   During this procedure medications were administered to achieve and maintain moderate conscious sedation while the patient's heart rate, blood pressure, and oxygen saturation were continuously monitored and I was present face-to-face 100% of this time.  Medications (Filter: Administrations occurring from  1345 to 1456 on 06/26/20) fentaNYL (SUBLIMAZE) injection (mcg) Total dose:  25 mcg Date/Time  Rate/Dose/Volume Action  06/26/20 1405  25 mcg Given    midazolam (VERSED) injection (mg) Total dose:  1 mg Date/Time  Rate/Dose/Volume Action  06/26/20 1405  1 mg Given    Heparin (Porcine) in NaCl 1000-0.9 UT/500ML-% SOLN (mL) Total volume:  1,000 mL Date/Time  Rate/Dose/Volume Action  06/26/20 1420  500 mL Given  1420  500 mL Given    Radial Cocktail/Verapamil only (mL) Total volume:  10 mL Date/Time  Rate/Dose/Volume Action  06/26/20 1420  10 mL Given    lidocaine (PF) (XYLOCAINE) 1 % injection (mL) Total volume:  5 mL Date/Time  Rate/Dose/Volume Action  06/26/20 1420  5 mL Given    heparin sodium (porcine) injection (Units) Total dose:  3,000 Units Date/Time  Rate/Dose/Volume Action  06/26/20 1424  3,000 Units Given    iohexol (OMNIPAQUE) 350 MG/ML injection (mL) Total volume:  100 mL Date/Time  Rate/Dose/Volume Action  06/26/20 1443  100 mL Given    Sedation Time  Sedation Time Physician-1: 31 minutes 12 seconds  Contrast  Medication Name Total Dose  iohexol (OMNIPAQUE) 350 MG/ML injection 100 mL    Radiation/Fluoro  Fluoro time: 3.3 (min) DAP: 8.5 (mGycm2) Cumulative Air Kerma: 128.2 (mGy)  Coronary Findings  Diagnostic Dominance: Right Left Main  Ost LM to Mid LM lesion is 50% stenosed. The lesion is calcified.  Left Anterior Descending  Mid LAD lesion is 40% stenosed.  Left Circumflex  Ost Cx to Prox Cx lesion is 60% stenosed. The lesion is calcified.  Mid Cx lesion is 80% stenosed with 90% stenosed side branch in 2nd Mrg.  Right Coronary Artery  Ost RCA to Prox RCA lesion is 100% stenosed. The lesion is severely calcified.  Right Posterior Descending Artery  Collaterals  RPDA filled by collaterals from 1st Sept.    First Right Posterolateral Branch  Collaterals  1st RPL filled by collaterals from 2nd Sept.     Intervention  No interventions have been documented. Coronary Diagrams  Diagnostic Dominance: Right  Intervention  Implants   No implant documentation for this case.  Syngo Images  Show images for CARDIAC CATHETERIZATION Images on Long Term Storage  Show images for Luismanuel, Corman to Procedure Log  Procedure  Log    Hemo Data   Most Recent Value  Fick Cardiac Output 7.35 L/min  Fick Cardiac Output Index 4.53 (L/min)/BSA  RA A Wave 6 mmHg  RA V Wave 1 mmHg  RA Mean 1 mmHg  RV Systolic Pressure 25 mmHg  RV Diastolic Pressure 0 mmHg  RV EDP 2 mmHg  PA Systolic Pressure 27 mmHg  PA Diastolic Pressure 5 mmHg  PA Mean 13 mmHg  PW A Wave 12 mmHg  PW V Wave 7 mmHg  PW Mean 6 mmHg  AO Systolic Pressure 258 mmHg  AO Diastolic Pressure 64 mmHg  AO Mean 90 mmHg  QP/QS 0.94  TPVR Index 2.88 HRUI  TSVR Index 19.88 HRUI  PVR SVR Ratio 0.08  TPVR/TSVR Ratio 0.14     Cardiac TAVR CT  TECHNIQUE: The patient was scanned on a Siemens Force 527 slice scanner. A 120 kV retrospective scan was triggered in the descending thoracic aorta at 111 HU's. Gantry rotation speed was 270 msecs and collimation was .9 mm. No beta blockade or nitro were given. The 3D data set was reconstructed in 5% intervals of the R-R cycle. Systolic and diastolic phases were analyzed on a dedicated work station using MPR, MIP and VRT modes. The patient received 80 cc of contrast.  FINDINGS: Aortic Valve: Tri leaflet calcified with restricted leaflet motion  Aorta: Normal arch vessels no aneurysm mild calcific atherosclerosis  Sinotubular Junction: 23 mm  Ascending Thoracic Aorta: 27 mm  Aortic Arch: 23 mm  Descending Thoracic Aorta: 23 mm  Sinus of Valsalva Measurements:  Non-coronary: 28.8 mm  Right - coronary: 26.6 mm  Left - coronary: 26 mm  Coronary Artery Height above Annulus:  Left Main: 12.9 mm above annulus  Right Coronary: 12.3 mm above  annulus  Virtual Basal Annulus Measurements:  Maximum/Minimum Diameter: 27.6 mm x 22.5 mm  Perimeter: 77 mm  Area: 455 mm 2  Coronary Arteries: Sufficient height above annulus for deployment  Optimum Fluoroscopic Angle for Delivery: RAO 7 Caudal 4 degrees  IMPRESSION: 1. Tri leaflet AV with annular area of 455 mm2 suitable for a 26 mm Sapien 3 valve. However the patient has a small STJ/Sinus diameter  2. Normal aortic root 2.7 cm  3. Coronary arteries sufficient height above annulus for deployment  4. Optimum angiographic angle for deployment RAO 7 Caudal 4 degrees  Jenkins Rouge   Electronically Signed By: Jenkins Rouge M.D. On: 07/02/2020 11:57     CT ANGIOGRAPHY CHEST, ABDOMEN AND PELVIS  TECHNIQUE: Multidetector CT imaging through the chest, abdomen and pelvis was performed using the standard protocol during bolus administration of intravenous contrast. Multiplanar reconstructed images and MIPs were obtained and reviewed to evaluate the vascular anatomy.  CONTRAST: 139mL OMNIPAQUE IOHEXOL 350 MG/ML SOLN  COMPARISON: No priors.  FINDINGS: CTA CHEST FINDINGS  Cardiovascular: Heart size is normal. There is no significant pericardial fluid, thickening or pericardial calcification. There is aortic atherosclerosis, as well as atherosclerosis of the great vessels of the mediastinum and the coronary arteries, including calcified atherosclerotic plaque in the left main, left anterior descending, left circumflex and right coronary arteries. Severe thickening calcification of the aortic valve.  Mediastinum/Lymph Nodes: No pathologically enlarged mediastinal or hilar lymph nodes. Esophagus is unremarkable in appearance. No axillary lymphadenopathy.  Lungs/Pleura: No acute consolidative airspace disease. No pleural effusions. A few scattered 2-3 mm pulmonary nodules are noted in the lungs bilaterally, nonspecific, but statistically  likely benign. No other suspicious appearing pulmonary nodules or masses are noted.  Musculoskeletal/Soft Tissues: There are no aggressive appearing lytic or blastic lesions noted in the visualized portions of the skeleton.  CTA ABDOMEN AND PELVIS FINDINGS  Hepatobiliary: No suspicious cystic or solid hepatic lesions. No intra or extrahepatic biliary ductal dilatation. Several gallstones are noted in the lumen of the gallbladder. Gallbladder is decompressed, without surrounding inflammatory changes to suggest an acute cholecystitis at this time.  Pancreas: No pancreatic mass. No pancreatic ductal dilatation. No pancreatic or peripancreatic fluid collections or inflammatory changes.  Spleen: Unremarkable.  Adrenals/Urinary Tract: Bilateral kidneys and bilateral adrenal glands are normal in appearance. No hydroureteronephrosis. Urinary bladder is normal in appearance.  Stomach/Bowel: Normal appearance of the stomach. No pathologic dilatation of small bowel or colon. 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 Normal appendix.  Vascular/Lymphatic: Aortic atherosclerosis, without evidence of aneurysm or dissection in the abdominal or pelvic vasculature. No lymphadenopathy noted in the abdomen or pelvis.  Reproductive: Prostate gland and seminal vesicles are unremarkable in appearance.  Other: No significant volume of ascites. No pneumoperitoneum.  Musculoskeletal: There are no aggressive appearing lytic or blastic lesions noted in the visualized portions of the skeleton.  VASCULAR MEASUREMENTS PERTINENT TO TAVR:  AORTA:  Minimal Aortic Diameter-13 x 12 mm  Severity of Aortic Calcification-severe  RIGHT PELVIS:  Right Common Iliac Artery -  Minimal Diameter-8.2 x 8.5 mm  Tortuosity-mild  Calcification-moderate  Right  External Iliac Artery -  Minimal Diameter-7.1 x 7.3 mm  Tortuosity-moderate  Calcification-none  Right Common Femoral Artery -  Minimal Diameter-6.7 x 6.1 mm  Tortuosity-mild  Calcification-moderate  LEFT PELVIS:  Left Common Iliac Artery -  Minimal Diameter-9.0 x 9.0 mm  Tortuosity-mild  Calcification-moderate  Left External Iliac Artery -  Minimal Diameter-7.3 x 7.3 mm  Tortuosity-moderate  Calcification-none  Left Common Femoral Artery -  Minimal Diameter-6.6 x 5.8 mm  Tortuosity-mild  Calcification-moderate to severe  Review of the MIP images confirms the above findings.  IMPRESSION: 1. Vascular findings and measurements pertinent to potential TAVR procedure, as detailed above. 2. Severe thickening calcification of the aortic valve, compatible with reported clinical history of severe aortic stenosis. 3. Severe colonic diverticulosis without evidence of acute diverticulitis at this time. 4. Cholelithiasis without evidence of acute diverticulitis at this time. 5. Aortic atherosclerosis, in addition to left main and 3 vessel coronary artery disease. 6. Additional incidental findings, as above.   Electronically Signed By: Vinnie Langton M.D. On: 07/02/2020 11:46    EKG:   NSR w/out significant AV conduction delay (06/26/2020)   Carotid Arterial Duplex Study   Indications:    Severe aortic stenosis - preop for TAVR.  Risk Factors:   Coronary artery disease.  Comparison Study: No prior.   Performing Technologist: Oda Cogan RDMS, RVT     Examination Guidelines: A complete evaluation includes B-mode imaging,  spectral  Doppler, color Doppler, and power Doppler as needed of all accessible  portions  of each vessel. Bilateral testing is considered an integral part of a  complete  examination. Limited examinations for reoccurring indications may be  performed  as noted.     Right Carotid  Findings:  +----------+--------+--------+--------+------------------+-----------------  -+       PSV cm/sEDV cm/sStenosisPlaque DescriptionComments        +----------+--------+--------+--------+------------------+-----------------  -+  CCA Prox 84   17                            +----------+--------+--------+--------+------------------+-----------------  -+  CCA Distal86   18                intimal  thickening  +----------+--------+--------+--------+------------------+-----------------  -+  ICA Prox 58   12   1-39%           intimal  thickening  +----------+--------+--------+--------+------------------+-----------------  -+  ICA Distal66   22                            +----------+--------+--------+--------+------------------+-----------------  -+  ECA    40   6                             +----------+--------+--------+--------+------------------+-----------------  -+   +----------+--------+-------+----------------+-------------------+       PSV cm/sEDV cmsDescribe    Arm Pressure (mmHG)  +----------+--------+-------+----------------+-------------------+  [id number]       Multiphasic, WNL            +----------+--------+-------+----------------+-------------------+   +---------+--------+--+--------+--+---------+  VertebralPSV cm/s41EDV cm/s12Antegrade  +---------+--------+--+--------+--+---------+      Left Carotid Findings:  +----------+--------+--------+--------+------------------+-----------------  -+       PSV cm/sEDV cm/sStenosisPlaque DescriptionComments        +----------+--------+--------+--------+------------------+-----------------  -+  CCA Prox 93   18                             +----------+--------+--------+--------+------------------+-----------------  -+  CCA Distal83   22                intimal  thickening  +----------+--------+--------+--------+------------------+-----------------  -+  ICA Prox 76   16   1-39%           intimal  thickening  +----------+--------+--------+--------+------------------+-----------------  -+  ICA Distal54   18                            +----------+--------+--------+--------+------------------+-----------------  -+  ECA    70   13                            +----------+--------+--------+--------+------------------+-----------------  -+   +----------+--------+--------+----------------+-------------------+       PSV cm/sEDV cm/sDescribe    Arm Pressure (mmHG)  +----------+--------+--------+----------------+-------------------+  [id number]       Multiphasic, WNL            +----------+--------+--------+----------------+-------------------+   +---------+--------+--+--------+--+---------+  VertebralPSV cm/s34EDV cm/s11Antegrade  +---------+--------+--+--------+--+---------+         Summary:  Right Carotid: Velocities in the right ICA are consistent with a 1-39%  stenosis.   Left Carotid: Velocities in the left ICA are consistent with a 1-39%  stenosis.   Vertebrals: Bilateral vertebral arteries demonstrate antegrade flow.  Subclavians: Normal flow hemodynamics were seen in bilateral subclavian        arteries.   *See table(s) above for measurements and observations.      Electronically signed by Deitra Mayo MD on 07/13/2020 at 4:20:11  AM.      Impression:  Patient has stage D1 very severe,symptomatic aortic stenosis as well as significant left main and three-vessel coronary artery disease. Although the patient denies  any symptoms of exertional shortness of breath or chest discomfort, he has a long history of progressive and quite severe generalized fatigue with exhaustion, loss of appetite, and weight loss. I feel the patient has critical aortic stenosis and coronary artery disease with profound  generalized weakness and cardiac cachexia, although the patient's symptoms may be at least partially related to severe depression.  I have personally reviewed the patient's recent transthoracic echocardiogram, diagnostic cardiac catheterization, and CT angiograms. Echocardiogram reveals normal left ventricular systolic function with very severe aortic stenosis. The aortic valve is trileaflet with severe thickening, calcification, and restricted leaflet mobility involving all 3 leaflets of the aortic valve. Peak velocity across aortic valve measured close to 5.5 m/s corresponding to mean transvalvular gradient estimated 77 mmHg and aortic valve area calculated only 0.59 cm with DVI reported 0.19. Diagnostic cardiac catheterization revealed significant left main and three-vessel coronary artery disease with chronic occlusion of the proximal right coronary artery, left to right collateral filling of the terminal branches of the right coronary artery, and at least 50% stenosis of the left main coronary artery. Under the circumstances, conventional surgical aortic valve replacement with coronary artery bypass grafting would clearly provide the best long-term result. However, risks associated with surgery would clearly be significantly impacted by the patient's generalized weakness, malnutrition, and depression. Cardiac-gated CTA of the heart reveals anatomical characteristics consistent with aortic stenosis suitable for treatment by transcatheter aortic valve replacement without any significant complicating features and CTA of the aorta and iliac vessels demonstrate what appears to be adequate pelvic vascular access to facilitate  a transfemoral approach.EKG reveals sinus rhythm without significant AV conduction delay.    Plan:  The patientand his wife wereagain counseled at length regarding treatment alternatives for management of severe symptomatic aortic stenosis and coronary artery disease.  Alternative approaches such as conventional aortic valve replacementwith coronary artery bypass grafting, transcatheter aortic valve replacement, and continued medical therapy without intervention were compared and contrasted at length.  The risks associated with conventional surgerywere discussed in detail, as were expectations for post-operative convalescence.  I suspect the patient would likely survive elective aortic valve replacement and coronary artery bypass grafting, although there is no question that he would be at risk for multiple complications and have the potential for a very slow and difficult recovery.  Issues specific to transcatheter aortic valve replacement were discussed including the presence of significant left main coronary artery stenosis with chronic occlusion of the right coronary artery and the associated impact on both procedure risks related to transcatheter aortic valve replacement as well as long-term survival.  The patient's best chances of living at least another 5 to 10 years would be best following definitive surgical treatment of both his aortic stenosis and coronary artery disease.  The patient and his wife desire to think matters over a little bit longer and discussed them further with the rest of the family.  I have encouraged them to make a decision soon given the severity of the patient's aortic stenosis and coronary artery disease.  If the patient desires to proceed with high risk surgical intervention we could proceed with aortic valve replacement and coronary artery bypass grafting later this week.  Alternatively, transcatheter aortic valve replacement could be scheduled as early as July 23, 2020.  All questions answered.      Valentina Gu. Roxy Manns, MD 07/15/2020 4:15 PM

## 2020-07-19 NOTE — Op Note (Addendum)
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  Postoperative Diagnosis: Same  Procedure:    Aortic Valve Replacement  Edwards Inspiris Resilia Stented Bovine Pericardial Tissue Valve (size 107mm, ref # [id number], serial # [id number])  Aortic root enlargement using bovine pericardial patch (Nicks' technique)   Coronary Artery Bypass Grafting x 4  Left Internal Mammary Artery to Distal Left Anterior Descending Coronary Artery  Saphenous Vein Graft to Distal Right Coronary Artery  Saphenous Vein Graft to First Obtuse Marginal Branch of Left Circumflex Coronary Artery  Sequential Saphenous Vein Graft to Third Obtuse Marginal Branch of Left Circumflex Coronary Artery  Endoscopic Vein Harvest from Left Thigh  Surgeon: Valentina Gu. Roxy Manns, MD  Assistant: John Giovanni, PA-C  Anesthesia: Arabella Merles, MD  Operative Findings:  Severe aortic stenosis  Normal left ventricular systolic function  Moderate-severe left ventricular hypertrophy   Good quality left internal mammary artery conduit  Good quality saphenous vein conduit  Good quality target vessels for grafting            BRIEF CLINICAL NOTE AND INDICATIONS FOR SURGERY  Patient is 79 year old male with history of aortic stenosis, hyperlipidemia, depression and anxiety who has been referred for surgical consultation to discuss treatment options for management of very severe aortic stenosis with left main and multivessel coronary artery disease.  Patient states that he has been told that he has had a heart murmur nearly all of his life.He has been followed in the past by his primary care physician. Recent follow-up transthoracic echocardiogram revealed findings consistent with severe aortic stenosis and preserved left ventricular function. The patient was referred for cardiology  consultation and initially evaluated by Dr. Debara Pickett. Repeat echocardiogram was performed May 28, 2020 and revealed findings consistent with very severe aortic stenosis including peak velocity across aortic valve measured nearly 5.5 m/s corresponding to mean transvalvular gradient estimated 77 mmHg and aortic valve area calculated only 0.59 cm with DVI reported 0.19. Left ventricular systolic function remain normal with ejection fraction estimated 60 to 65%. The patient was referred to the multidisciplinary heart valve clinic and has been evaluated previously by Dr. Burt Knack. Diagnostic cardiac catheterization was performed June 26, 2020 and revealed significant left main and three-vessel coronary artery disease. The aortic valve was not crossed at the time of catheterization. Right heart pressures were normal. CT angiography was performed and cardiothoracic surgical consultation requested.  The patient has been seen in consultation and counseled at length regarding the indications, risks and potential benefits of surgery.  All questions have been answered, and the patient provides full informed consent for the operation as described.    DETAILS OF THE OPERATIVE PROCEDURE  Preparation:  The patient is brought to the operating room on the above mentioned date and central monitoring was established by the anesthesia team including placement of Swan-Ganz catheter and radial arterial line. The patient is placed in the supine position on the operating table.  Intravenous antibiotics are administered. General endotracheal anesthesia is induced uneventfully. A Foley catheter is placed.  Baseline transesophageal echocardiogram was performed.  Findings were notable for severe calcific aortic stenosis with normal left ventricular systolic function.  There was normal to severe left ventricular hypertrophy with likely significant diastolic dysfunction.  Mitral valve appeared normal.  Right ventricular size and  function was normal.  No other significant abnormalities were noted.  The patient's chest, abdomen, both groins,  and both lower extremities are prepared and draped in a sterile manner. A time out procedure is performed.   Surgical Approach and Conduit Harvest:  A median sternotomy incision was performed and the left internal mammary artery is dissected from the chest wall and prepared for bypass grafting. The left internal mammary artery is notably good quality conduit. Simultaneously, saphenous vein is obtained from the patient's left thigh using endoscopic vein harvest technique.  Initially a small incision is made in the right lower extremity but the vein on the right side appears relatively small.  The saphenous vein is notably good quality conduit. After removal of the saphenous vein, the small surgical incisions in the lower extremity are closed with absorbable suture. Following systemic heparinization, the left internal mammary artery was transected distally noted to have excellent flow.   Extracorporeal Cardiopulmonary Bypass and Myocardial Protection:  The pericardium is opened. The ascending aorta is normal in appearance. The ascending aorta and the right atrium are cannulated for cardiopulmonary bypass.  Adequate heparinization is verified.    A retrograde cardioplegia cannula is placed through the right atrium into the coronary sinus.  The operative field was continuously flooded with carbon dioxide gas.  The entire pre-bypass portion of the operation was notable for stable hemodynamics.  Cardiopulmonary bypass was begun and a left ventricular vent placed through the right superior pulmonary vein.  The surface of the heart inspected. Distal target vessels are selected for coronary artery bypass grafting. A cardioplegia cannula is placed in the ascending aorta.  A temperature probe was placed in the interventricular septum.  The patient is cooled to 32C systemic temperature.  The aortic  cross clamp is applied and cardioplegia is delivered initially in an antegrade fashion through the aortic root using modified [place] cold blood cardioplegia (Kennestone blood cardioplegia protocol).   The initial cardioplegic arrest is rapid with early diastolic arrest.  Repeat doses of cardioplegia are administered at 90 minutes and every 30 minutes thereafter through the aortic root, the coronary sinus catheter, and through subsequently placed vein grafts in order to maintain completely flat electrocardiogram and septal myocardial temperature below 15C.  Myocardial protection was felt to be excellent.   Coronary Artery Bypass Grafting:   The distal right coronary artery was grafted using a reversed saphenous vein graft in an end-to-side fashion.  At the site of distal anastomosis the target vessel was good quality and measured approximately 1.8 mm in diameter.  The first obtuse marginal branch of the left circumflex coronary artery was grafted using a reversed saphenous vein graft in an side-to-side fashion.  At the site of distal anastomosis the target vessel was good quality and measured approximately 2.0 mm in diameter.  The third obtuse marginal branch of the left circumflex coronary artery was grafted using a reversed saphenous vein graft in an end-to-side fashion using a sequential saphenous vein graft off of the vein graft placed to the first obtuse marginal branch..  At the site of distal anastomosis the target vessel was good quality and measured approximately 1.7 mm in diameter.  The distal left anterior coronary artery was grafted with the left internal mammary artery in an end-to-side fashion.  At the site of distal anastomosis the target vessel was good quality and measured approximately 2.0 mm in diameter.   Aortic Valve Replacement with Aortic Root Enlargement:  An oblique transverse aortotomy incision was performed.  The aortic valve was inspected and notable for severe aortic  stenosis.  The aortic valve leaflets were  excised sharply and the aortic annulus decalcified.  Decalcification was notably straightforward.  The aortic annulus was sized to accept a 21 mm prosthesis but appeared slightly small with relatively small aortic root and sinotubular junction.  Subsequently the aortotomy incision was extended down through the noncoronary sinus of Valsalva to the level of the aortic annulus.  The aortic root and left ventricle were irrigated with copious cold saline solution.  Aortic root enlargement is performed using bovine pericardial patch.  The patch is rinsed in saline per protocol.  The deep portion of the suture line for root enlargement is performed using interrupted horizontal mattress pledgeted 4-0 Prolene sutures which were placed at the nadir of the aortotomy along the aortic valve annulus prior to implantation of the aortic valve.  Aortic valve replacement was performed using interrupted horizontal mattress 2-0 Ethibond pledgeted sutures with pledgets in the subannular position.  An Edwards Inspiris Resilia stented bovine pericardial tissue valve (size 21 mm, ref # [id number], serial # [id number]) was implanted uneventfully. The valve seated appropriately with adequate space beneath the left main and right coronary artery.  The remainder of the aortic patch is subsequently trimmed to an elliptical shape and utilized to close the aortotomy circumferentially.  The suture line is performed using a combination of interrupted 4-0 pledgeted Prolene sutures with areas of running 2 layer closure.   Procedure Completion:  All proximal vein graft anastomoses were placed directly to the ascending aorta prior to removal of the aortic cross clamp.  The septal myocardial temperature rose rapidly after reperfusion of the left internal mammary artery graft.  One final dose of warm retrograde "reanimation dose" cardioplegia was administered through the coronary sinus catheter while all air  was evacuated through the aortic root.  The aortic cross clamp was removed after a total cross clamp time of 176 minutes.  All proximal and distal coronary anastomoses were inspected for hemostasis and appropriate graft orientation. Epicardial pacing wires are fixed to the inferior right ventricular freewall and to the right atrial appendage. The patient is rewarmed to 37C temperature. The aortic and left ventricular vents were removed.  The patient is weaned and disconnected from cardiopulmonary bypass.  The patient's rhythm at separation from bypass was AV paced.  The patient was weaned from cardiopulmonary bypass without any inotropic support. Total cardiopulmonary bypass time for the operation was 209 minutes.  Followup transesophageal echocardiogram performed after separation from bypass revealed a well-seated bioprosthetic tissue valve in the aortic position that was functioning normally.  There was no paravalvular leak.  There were otherwise no changes from the preoperative exam.  The aortic and venous cannula were removed uneventfully. Protamine was administered to reverse the anticoagulation. The mediastinum and pleural space were inspected for hemostasis and irrigated with saline solution.  There remains significant coagulopathy which is verified using thromboelastogram.  The patient received a total of 2 packs adult platelets, 2 units fresh frozen plasma and 1 pack cryoprecipitate due to coagulopathy and thrombocytopenia after separation from cardiopulmonary bypass and reversal of heparin with protamine.  The mediastinum and the left pleural space were drained using 4 chest tubes placed through separate stab incisions inferiorly.  The soft tissues anterior to the aorta were reapproximated loosely. The sternum is closed with Fibertape cerclage. The soft tissues anterior to the sternum were closed in multiple layers and the skin is closed with a running subcuticular skin closure.  The post-bypass  portion of the operation was notable for stable rhythm and hemodynamics.  The patient  received 2 units packed red blood cells during the procedure due to anemia which was present preoperatively and exacerbated by acute blood loss and hemodilution during cardiopulmonary bypass.    Disposition:  The patient tolerated the procedure well and is transported to the surgical intensive care in stable condition. There are no intraoperative complications. All sponge instrument and needle counts are verified correct at completion of the operation.   Valentina Gu. Roxy Manns MD 07/19/2020 2:43 PM

## 2020-07-19 NOTE — Progress Notes (Signed)
[facility] BRIEF SICU PROGRESS NOTE  Day of Surgery  S/P Procedure(s) (LRB): CORONARY ARTERY BYPASS GRAFTING (CABG), ON PUMP, TIMES FOUR, USING LEFT INTERNAL MAMMARY ARTERY AND ENDOSCOPICALLY HARVESTED LEFT GREATER SAPHENOUS VEIN (N/A) AORTIC VALVE REPLACEMENT (AVR) USING INSPIRIS RESILIA 21MM AORTIC VALVE (N/A) TRANSESOPHAGEAL ECHOCARDIOGRAM (TEE) (N/A) AORTIC ROOT ENLARGEMENT USING 6CM x 8CM PERI-GUARD BOVINE PERICARDIUM PATCH (N/A)   Although patient has remained hemodynamically stable since return from OR there has been excessive drainage from chest tubes with total > 800 mL.  Labs reveal acute blood loss anemia with persistent although improved thrombocytopenia and coagulopathy  Plan: Will return to OR immediately for reexploration for bleeding.  OR notified.  Patient's wife contacted via telephone and circumstances explained.  She understands and verbalized consent.  Rexene Alberts, MD 07/19/2020 4:37 PM

## 2020-07-19 NOTE — Anesthesia Procedure Notes (Signed)
Procedure Name: Intubation Date/Time: 07/19/2020 7:52 AM Performed by: Clearnce Sorrel, CRNA Pre-anesthesia Checklist: Patient identified, Emergency Drugs available, Suction available, Patient being monitored and Timeout performed Patient Re-evaluated:Patient Re-evaluated prior to induction Oxygen Delivery Method: Circle system utilized Preoxygenation: Pre-oxygenation with 100% oxygen Induction Type: IV induction Ventilation: Mask ventilation without difficulty Laryngoscope Size: Mac and 4 Grade View: Grade I Tube type: Oral Tube size: 8.0 mm Number of attempts: 1 Airway Equipment and Method: Stylet Placement Confirmation: ETT inserted through vocal cords under direct vision,  positive ETCO2 and breath sounds checked- equal and bilateral Secured at: 23 cm Tube secured with: Tape Dental Injury: Teeth and Oropharynx as per pre-operative assessment

## 2020-07-19 NOTE — Anesthesia Postprocedure Evaluation (Signed)
Anesthesia Post Note  Patient: Neil [name]  Procedure(s) Performed: EXPLORATION POST OPERATIVE OPEN HEART (N/A Chest)     Patient location during evaluation: ICU Anesthesia Type: General Level of consciousness: sedated and patient remains intubated per anesthesia plan Pain management: pain level controlled Vital Signs Assessment: post-procedure vital signs reviewed and stable Respiratory status: patient remains intubated per anesthesia plan Cardiovascular status: stable Postop Assessment: no apparent nausea or vomiting Anesthetic complications: no   No complications documented.  Last Vitals:  Vitals:   07/19/20 1700 07/19/20 1855  BP:  (!) 154/88  Pulse:  80  Resp: 14 12  Temp: (!) 34.5 C   SpO2:  98%    Last Pain:  Vitals:   07/19/20 1700  TempSrc: Core  PainSc:                  Audry Pili

## 2020-07-19 NOTE — Interval H&P Note (Signed)
History and Physical Interval Note:  07/19/2020 6:17 AM  Neil [name]  has presented today for surgery, with the diagnosis of CAD SEVERE AS.  The various methods of treatment have been discussed with the patient and family. After consideration of risks, benefits and other options for treatment, the patient has consented to  Procedure(s): CORONARY ARTERY BYPASS GRAFTING (CABG) (N/A) AORTIC VALVE REPLACEMENT (AVR) (N/A) TRANSESOPHAGEAL ECHOCARDIOGRAM (TEE) (N/A) as a surgical intervention.  The patient's history has been reviewed, patient examined, no change in status, stable for surgery.  I have reviewed the patient's chart and labs.  Questions were answered to the patient's satisfaction.     Rexene Alberts

## 2020-07-19 NOTE — Anesthesia Procedure Notes (Signed)
Central Venous Catheter Insertion Performed by: Roderic Palau, MD, anesthesiologist Start/End8/05/2020 6:40 AM, 07/19/2020 6:55 AM Patient location: Pre-op. Preanesthetic checklist: patient identified, IV checked, site marked, risks and benefits discussed, surgical consent, monitors and equipment checked, pre-op evaluation, timeout performed and anesthesia consent Hand hygiene performed  and maximum sterile barriers used  PA cath was placed.Swan type:thermodilution PA Cath depth:50 Procedure performed without using ultrasound guided technique. Attempts: 1 Patient tolerated the procedure well with no immediate complications.

## 2020-07-20 ENCOUNTER — Inpatient Hospital Stay (HOSPITAL_COMMUNITY): Payer: Medicare HMO

## 2020-07-20 LAB — POCT I-STAT 7, (LYTES, BLD GAS, ICA,H+H)
Acid-Base Excess: 2 mmol/L (ref 0.0–2.0)
Acid-Base Excess: 2 mmol/L (ref 0.0–2.0)
Acid-base deficit: 1 mmol/L (ref 0.0–2.0)
Bicarbonate: 27.7 mmol/L (ref 20.0–28.0)
Bicarbonate: 24.4 mmol/L (ref 20.0–28.0)
Bicarbonate: 27.7 mmol/L (ref 20.0–28.0)
Calcium, Ion: 1.18 mmol/L (ref 1.15–1.40)
Calcium, Ion: 1.16 mmol/L (ref 1.15–1.40)
Calcium, Ion: 1.17 mmol/L (ref 1.15–1.40)
HCT: 30 % — ABNORMAL LOW (ref 39.0–52.0)
HCT: 32 % — ABNORMAL LOW (ref 39.0–52.0)
HCT: 32 % — ABNORMAL LOW (ref 39.0–52.0)
Hemoglobin: 10.2 g/dL — ABNORMAL LOW (ref 13.0–17.0)
Hemoglobin: 10.9 g/dL — ABNORMAL LOW (ref 13.0–17.0)
Hemoglobin: 10.9 g/dL — ABNORMAL LOW (ref 13.0–17.0)
O2 Saturation: 100 %
O2 Saturation: 99 %
O2 Saturation: 98 %
Potassium: 3.8 mmol/L (ref 3.5–5.1)
Potassium: 3.7 mmol/L (ref 3.5–5.1)
Potassium: 3.7 mmol/L (ref 3.5–5.1)
Sodium: 140 mmol/L (ref 135–145)
Sodium: 140 mmol/L (ref 135–145)
Sodium: 140 mmol/L (ref 135–145)
TCO2: 29 mmol/L (ref 22–32)
TCO2: 26 mmol/L (ref 22–32)
TCO2: 29 mmol/L (ref 22–32)
pCO2 arterial: 47.8 mmHg (ref 32.0–48.0)
pCO2 arterial: 41.8 mmHg (ref 32.0–48.0)
pCO2 arterial: 47.5 mmHg (ref 32.0–48.0)
pH, Arterial: 7.371 (ref 7.350–7.450)
pH, Arterial: 7.375 (ref 7.350–7.450)
pH, Arterial: 7.374 (ref 7.350–7.450)
pO2, Arterial: 215 mmHg — ABNORMAL HIGH (ref 83.0–108.0)
pO2, Arterial: 155 mmHg — ABNORMAL HIGH (ref 83.0–108.0)
pO2, Arterial: 110 mmHg — ABNORMAL HIGH (ref 83.0–108.0)

## 2020-07-20 LAB — BASIC METABOLIC PANEL
Anion gap: 12 (ref 5–15)
Anion gap: 12 (ref 5–15)
Anion gap: 10 (ref 5–15)
BUN: 10 mg/dL (ref 8–23)
BUN: 12 mg/dL (ref 8–23)
BUN: 18 mg/dL (ref 8–23)
CO2: 24 mmol/L (ref 22–32)
CO2: 24 mmol/L (ref 22–32)
CO2: 25 mmol/L (ref 22–32)
Calcium: 8.5 mg/dL — ABNORMAL LOW (ref 8.9–10.3)
Calcium: 8.4 mg/dL — ABNORMAL LOW (ref 8.9–10.3)
Calcium: 8.4 mg/dL — ABNORMAL LOW (ref 8.9–10.3)
Chloride: 102 mmol/L (ref 98–111)
Chloride: 103 mmol/L (ref 98–111)
Chloride: 102 mmol/L (ref 98–111)
Creatinine, Ser: 0.8 mg/dL (ref 0.61–1.24)
Creatinine, Ser: 0.95 mg/dL (ref 0.61–1.24)
Creatinine, Ser: 1.09 mg/dL (ref 0.61–1.24)
GFR calc Af Amer: >60 mL/min (ref >60)
GFR calc Af Amer: >60 mL/min (ref >60)
GFR calc Af Amer: >60 mL/min (ref >60)
GFR calc non Af Amer: >60 mL/min (ref >60)
GFR calc non Af Amer: >60 mL/min (ref >60)
GFR calc non Af Amer: >60 mL/min (ref >60)
Glucose, Bld: 142 mg/dL — ABNORMAL HIGH (ref 70–99)
Glucose, Bld: 142 mg/dL — ABNORMAL HIGH (ref 70–99)
Glucose, Bld: 207 mg/dL — ABNORMAL HIGH (ref 70–99)
Potassium: 3.9 mmol/L (ref 3.5–5.1)
Potassium: 3.8 mmol/L (ref 3.5–5.1)
Potassium: 3.8 mmol/L (ref 3.5–5.1)
Sodium: 138 mmol/L (ref 135–145)
Sodium: 139 mmol/L (ref 135–145)
Sodium: 137 mmol/L (ref 135–145)

## 2020-07-20 LAB — CBC
HCT: 31.6 % — ABNORMAL LOW (ref 39.0–52.0)
HCT: 32 % — ABNORMAL LOW (ref 39.0–52.0)
HCT: 32.9 % — ABNORMAL LOW (ref 39.0–52.0)
Hemoglobin: 11 g/dL — ABNORMAL LOW (ref 13.0–17.0)
Hemoglobin: 11.4 g/dL — ABNORMAL LOW (ref 13.0–17.0)
Hemoglobin: 11.3 g/dL — ABNORMAL LOW (ref 13.0–17.0)
MCH: 31.1 pg (ref 26.0–34.0)
MCH: 31.8 pg (ref 26.0–34.0)
MCH: 30.7 pg (ref 26.0–34.0)
MCHC: 34.8 g/dL (ref 30.0–36.0)
MCHC: 35.6 g/dL (ref 30.0–36.0)
MCHC: 34.3 g/dL (ref 30.0–36.0)
MCV: 89.3 fL (ref 80.0–100.0)
MCV: 89.1 fL (ref 80.0–100.0)
MCV: 89.4 fL (ref 80.0–100.0)
Platelets: 144 10*3/uL — ABNORMAL LOW (ref 150–400)
Platelets: 136 10*3/uL — ABNORMAL LOW (ref 150–400)
Platelets: 120 10*3/uL — ABNORMAL LOW (ref 150–400)
RBC: 3.54 MIL/uL — ABNORMAL LOW (ref 4.22–5.81)
RBC: 3.59 MIL/uL — ABNORMAL LOW (ref 4.22–5.81)
RBC: 3.68 MIL/uL — ABNORMAL LOW (ref 4.22–5.81)
RDW: 13.4 % (ref 11.5–15.5)
RDW: 13.4 % (ref 11.5–15.5)
RDW: 13.9 % (ref 11.5–15.5)
WBC: 7.3 10*3/uL (ref 4.0–10.5)
WBC: 7.8 10*3/uL (ref 4.0–10.5)
WBC: 13.2 10*3/uL — ABNORMAL HIGH (ref 4.0–10.5)
nRBC: 0 % (ref 0.0–0.2)
nRBC: 0 % (ref 0.0–0.2)
nRBC: 0 % (ref 0.0–0.2)

## 2020-07-20 LAB — GLUCOSE, CAPILLARY
Glucose-Capillary: 103 mg/dL — ABNORMAL HIGH (ref 70–99)
Glucose-Capillary: 119 mg/dL — ABNORMAL HIGH (ref 70–99)
Glucose-Capillary: 125 mg/dL — ABNORMAL HIGH (ref 70–99)
Glucose-Capillary: 132 mg/dL — ABNORMAL HIGH (ref 70–99)
Glucose-Capillary: 134 mg/dL — ABNORMAL HIGH (ref 70–99)
Glucose-Capillary: 142 mg/dL — ABNORMAL HIGH (ref 70–99)
Glucose-Capillary: 146 mg/dL — ABNORMAL HIGH (ref 70–99)
Glucose-Capillary: 161 mg/dL — ABNORMAL HIGH (ref 70–99)
Glucose-Capillary: 171 mg/dL — ABNORMAL HIGH (ref 70–99)
Glucose-Capillary: 204 mg/dL — ABNORMAL HIGH (ref 70–99)

## 2020-07-20 LAB — MAGNESIUM
Magnesium: 2 mg/dL (ref 1.7–2.4)
Magnesium: 2 mg/dL (ref 1.7–2.4)
Magnesium: 2 mg/dL (ref 1.7–2.4)

## 2020-07-20 MED ORDER — INSULIN ASPART 100 UNIT/ML ~~LOC~~ SOLN
0.0000 [IU] | SUBCUTANEOUS | Status: DC
  Administered 2020-07-20: 4 [IU] via SUBCUTANEOUS
  Administered 2020-07-20: 8 [IU] via SUBCUTANEOUS
  Administered 2020-07-21: 4 [IU] via SUBCUTANEOUS

## 2020-07-20 MED ORDER — LORAZEPAM 0.5 MG PO TABS
0.5000 mg | ORAL_TABLET | Freq: Four times a day (QID) | ORAL | Status: DC | PRN
  Administered 2020-07-20: 0.5 mg via ORAL
  Filled 2020-07-20: qty 1

## 2020-07-20 MED ORDER — HALOPERIDOL LACTATE 5 MG/ML IJ SOLN
INTRAMUSCULAR | Status: AC
  Administered 2020-07-20: 1 mg via INTRAVENOUS
  Filled 2020-07-20: qty 1

## 2020-07-20 MED ORDER — DEUTETRABENAZINE 9 MG PO TABS
18.0000 mg | ORAL_TABLET | Freq: Two times a day (BID) | ORAL | Status: DC
  Administered 2020-07-21 – 2020-07-26 (×11): 18 mg via ORAL
  Filled 2020-07-20 (×12): qty 2

## 2020-07-20 MED ORDER — DEUTETRABENAZINE 9 MG PO TABS: 18.0000 mg | ORAL_TABLET | Freq: Every day | ORAL | Status: DC

## 2020-07-20 MED ORDER — SERTRALINE HCL 50 MG PO TABS
75.0000 mg | ORAL_TABLET | Freq: Every day | ORAL | Status: DC
  Administered 2020-07-20 – 2020-07-26 (×7): 75 mg via ORAL
  Filled 2020-07-20 (×3): qty 2
  Filled 2020-07-20: qty 1
  Filled 2020-07-20: qty 2
  Filled 2020-07-20 (×2): qty 1

## 2020-07-20 MED ORDER — HYDROXYZINE HCL 25 MG PO TABS
25.0000 mg | ORAL_TABLET | Freq: Every evening | ORAL | Status: DC | PRN
  Administered 2020-07-21: 25 mg via ORAL
  Filled 2020-07-20 (×2): qty 1

## 2020-07-20 MED ORDER — CLOPIDOGREL BISULFATE 75 MG PO TABS
75.0000 mg | ORAL_TABLET | Freq: Every day | ORAL | Status: DC
  Administered 2020-07-21 – 2020-07-26 (×6): 75 mg via ORAL
  Filled 2020-07-20 (×6): qty 1

## 2020-07-20 MED ORDER — HALOPERIDOL LACTATE 5 MG/ML IJ SOLN
1.0000 mg | INTRAMUSCULAR | Status: DC | PRN
  Administered 2020-07-21: 1 mg via INTRAVENOUS
  Filled 2020-07-20 (×2): qty 1

## 2020-07-20 NOTE — Progress Notes (Signed)
Due to pt's positive symptoms of schizophrenia (I.e., paranoia and hallucinations) it has proved somewhat difficult to take vital signs and check the pt's CBG. He has pulled off his blood pressure cuff and is refusing to allow the staff to put it back on. He states that we are out to get him and that the people on the telephone are plotting against him. He has also refused to allow the nurse tech to scan his bracelet so that she may check his blood sugar. Although his wife has arrived, he still remains paranoid of staff and the sitter assigned to him. ECG leads are intact, pt refuses for staff to replace his oxygen saturation probe. Will continue to monitor and will reassess.

## 2020-07-20 NOTE — Procedures (Signed)
Extubation Procedure Note  Patient Details:   Name: Neil [name] DOB: May 07, 1941 MRN: [id number]   Airway Documentation:    Vent end date: 07/20/20 Vent end time: 0630   Evaluation  O2 sats: stable throughout Complications: No apparent complications Patient did tolerate procedure well. Bilateral Breath Sounds: Clear, Diminished   Yes   Suctioned mouth and airway before extubation. Leak present Nif -30 VC 1.2L no stridor noted. Pt on 4L Brady  Angelique Holm 07/20/2020, 6:36 AM

## 2020-07-20 NOTE — Progress Notes (Signed)
° °   °  [address]       ,[place] [address]             [phone]      POD # 1 AVR, CABG  Remains confused and paranoid. Non violent BP 134/82    Pulse 82    Temp 97.6 F (36.4 C) (Axillary)    Resp 17    Ht 5\' 5"  (1.651 m)    Wt 68.4 kg    SpO2 93%    BMI 25.09 kg/m   Intake/Output Summary (Last 24 hours) at 07/20/2020 1722 Last data filed at 07/20/2020 1703 Gross per 24 hour  Intake 4570.14 ml  Output 4630 ml  Net -59.86 ml   CBG moderately elevated  Has been refusing interventions. No focal neuro deficit  Psych meds restarted  Sitter with patient  Revonda Standard. Roxan Hockey, MD [facility] [phone]

## 2020-07-20 NOTE — Progress Notes (Signed)
1 Day Post-Op Procedure(s) (LRB): EXPLORATION POST OPERATIVE OPEN HEART (N/A) Subjective: Confused, oriented to person only  Objective: Vital signs in last 24 hours: Temp:  [92.8 F (33.8 C)-99.7 F (37.6 C)] 98.8 F (37.1 C) (08/07 0800) Pulse Rate:  [58-107] 80 (08/07 0800) Cardiac Rhythm: Atrial paced (08/07 0400) Resp:  [0-25] 13 (08/07 0800) BP: (114-154)/(55-94) 128/71 (08/07 0800) SpO2:  [94 %-100 %] 98 % (08/07 0800) Arterial Line BP: (103-171)/(49-88) 150/65 (08/07 0800) FiO2 (%):  [40 %-100 %] 40 % (08/07 0545) Weight:  [68.4 kg] 68.4 kg (08/07 0500)  Hemodynamic parameters for last 24 hours: PAP: (17-31)/(6-15) 30/12 CO:  [2.5 L/min-4.1 L/min] 3.1 L/min CI:  [1.5 L/min/m2-2.5 L/min/m2] 1.9 L/min/m2  Intake/Output from previous day: 08/06 0701 - 08/07 0700 In: 11571.2 [I.V.:5085.6; Blood:5126.7; IV Piggyback:1358.9] Out: 6920 [Urine:5880; Blood:400; Chest Tube:640] Intake/Output this shift: Total I/O In: 41.1 [I.V.:41.1] Out: 135 [Urine:75; Chest Tube:60]  General appearance: alert and confused Neurologic: no focal motor deficit, tardive dyskinesia Heart: regular rate and rhythm Lungs: diminished breath sounds bibasilar Abdomen: normal findings: soft, non-tender  Lab Results: Recent Labs    07/20/20 0056 07/20/20 0056 07/20/20 0455 07/20/20 0502 07/20/20 0605 07/20/20 0727  WBC 7.3  --  7.8  --   --   --   HGB 11.0*   < > 11.4*   < > 10.9* 10.9*  HCT 31.6*   < > 32.0*   < > 32.0* 32.0*  PLT 144*  --  136*  --   --   --    < > = values in this interval not displayed.   BMET:  Recent Labs    07/20/20 0056 07/20/20 0056 07/20/20 0455 07/20/20 0502 07/20/20 0605 07/20/20 0727  NA 138   < > 139   < > 140 140  K 3.9   < > 3.8   < > 3.7 3.7  CL 102  --  103  --   --   --   CO2 24  --  24  --   --   --   GLUCOSE 142*  --  142*  --   --   --   BUN 10  --  12  --   --   --   CREATININE 0.80  --  0.95  --   --   --   CALCIUM 8.5*  --  8.4*  --    --   --    < > = values in this interval not displayed.    PT/INR:  Recent Labs    07/19/20 1917  LABPROT 11.1*  INR 0.8   ABG    Component Value Date/Time   PHART 7.374 07/20/2020 0727   HCO3 27.7 07/20/2020 0727   TCO2 29 07/20/2020 0727   ACIDBASEDEF 1.0 07/20/2020 0605   O2SAT 98.0 07/20/2020 0727   CBG (last 3)  Recent Labs    07/20/20 0603 07/20/20 0652 07/20/20 0755  GLUCAP 161* 146* 132*    Assessment/Plan: S/P Procedure(s) (LRB): EXPLORATION POST OPERATIVE OPEN HEART (N/A) -POD # 1 NEURO- some confusion, not surprising given history and recent surgery  Restart home meds  Will ask family to stay as much as possible  Tele- monitoring, sitter if needed  CV- in SR, good hemodynamics- dc swan and A line  Plavix (ASA allergy), beta blocker, statin  RESP-IS  RENAL- creatinine and lytes OK- supplement K  ENDO- CBG moderately elevated- transition to SSI  Anemia secondary to ABL- better  post transfusion  Coagulopathy resolved   LOS: 1 day    Melrose Nakayama 07/20/2020

## 2020-07-20 NOTE — Progress Notes (Signed)
MD called because pt refuses to take PO meds, attempts to get out bed, reaching for chest tubes and introducer. Pt refuses to let RN redress central line. Pt is very paranoid and confused, will not let anyone touch him. Mits were placed on his hands for safety.  MD gave verbal orders for 1 mg haldol IV as needed every 2 hours.

## 2020-07-21 ENCOUNTER — Inpatient Hospital Stay (HOSPITAL_COMMUNITY): Payer: Medicare HMO

## 2020-07-21 LAB — PREPARE FRESH FROZEN PLASMA
Unit division: 0
Unit division: 0
Unit division: 0
Unit division: 0
Unit division: 0
Unit division: 0
Unit division: 0

## 2020-07-21 LAB — BPAM FFP
Blood Product Expiration Date: 202108092359
Blood Product Expiration Date: 202108102359
Blood Product Expiration Date: 202108092359
Blood Product Expiration Date: 202108112359
Blood Product Expiration Date: 202108112359
Blood Product Expiration Date: 202108112359
Blood Product Expiration Date: 202108112359
Blood Product Expiration Date: 202108112359
ISSUE DATE / TIME: 202108061403
ISSUE DATE / TIME: 202108061403
ISSUE DATE / TIME: 202108061641
ISSUE DATE / TIME: 202108061641
ISSUE DATE / TIME: 202108061723
ISSUE DATE / TIME: 202108061723
ISSUE DATE / TIME: 202108061742
ISSUE DATE / TIME: 202108071646
Unit Type and Rh: 6200
Unit Type and Rh: 6200
Unit Type and Rh: 600
Unit Type and Rh: 6200
Unit Type and Rh: 6200
Unit Type and Rh: 6200
Unit Type and Rh: 6200
Unit Type and Rh: 6200

## 2020-07-21 LAB — BASIC METABOLIC PANEL
Anion gap: 10 (ref 5–15)
BUN: 21 mg/dL (ref 8–23)
CO2: 26 mmol/L (ref 22–32)
Calcium: 8.5 mg/dL — ABNORMAL LOW (ref 8.9–10.3)
Chloride: 103 mmol/L (ref 98–111)
Creatinine, Ser: 0.99 mg/dL (ref 0.61–1.24)
GFR calc Af Amer: >60 mL/min (ref >60)
GFR calc non Af Amer: >60 mL/min (ref >60)
Glucose, Bld: 100 mg/dL — ABNORMAL HIGH (ref 70–99)
Potassium: 3.5 mmol/L (ref 3.5–5.1)
Sodium: 139 mmol/L (ref 135–145)

## 2020-07-21 LAB — PREPARE CRYOPRECIPITATE
Unit division: 0
Unit division: 0

## 2020-07-21 LAB — BPAM PLATELET PHERESIS
Blood Product Expiration Date: 202108072359
Blood Product Expiration Date: 202108072359
Blood Product Expiration Date: 202108092359
ISSUE DATE / TIME: 202108061403
ISSUE DATE / TIME: 202108061403
ISSUE DATE / TIME: 202108061645
Unit Type and Rh: 6200
Unit Type and Rh: 8400
Unit Type and Rh: 5100

## 2020-07-21 LAB — CBC
HCT: 34.1 % — ABNORMAL LOW (ref 39.0–52.0)
Hemoglobin: 11.7 g/dL — ABNORMAL LOW (ref 13.0–17.0)
MCH: 31.4 pg (ref 26.0–34.0)
MCHC: 34.3 g/dL (ref 30.0–36.0)
MCV: 91.4 fL (ref 80.0–100.0)
Platelets: 114 10*3/uL — ABNORMAL LOW (ref 150–400)
RBC: 3.73 MIL/uL — ABNORMAL LOW (ref 4.22–5.81)
RDW: 14 % (ref 11.5–15.5)
WBC: 17.8 10*3/uL — ABNORMAL HIGH (ref 4.0–10.5)
nRBC: 0 % (ref 0.0–0.2)

## 2020-07-21 LAB — PREPARE PLATELET PHERESIS
Unit division: 0
Unit division: 0
Unit division: 0

## 2020-07-21 LAB — GLUCOSE, CAPILLARY
Glucose-Capillary: 117 mg/dL — ABNORMAL HIGH (ref 70–99)
Glucose-Capillary: 187 mg/dL — ABNORMAL HIGH (ref 70–99)
Glucose-Capillary: 100 mg/dL — ABNORMAL HIGH (ref 70–99)
Glucose-Capillary: 127 mg/dL — ABNORMAL HIGH (ref 70–99)
Glucose-Capillary: 141 mg/dL — ABNORMAL HIGH (ref 70–99)

## 2020-07-21 LAB — BPAM CRYOPRECIPITATE
Blood Product Expiration Date: 202108061956
Blood Product Expiration Date: 202108062030
ISSUE DATE / TIME: 202108061431
ISSUE DATE / TIME: 202108061449
Unit Type and Rh: 6200
Unit Type and Rh: 6200

## 2020-07-21 MED ORDER — INSULIN ASPART 100 UNIT/ML ~~LOC~~ SOLN: 0.0000 [IU] | Freq: Every day | SUBCUTANEOUS | Status: DC

## 2020-07-21 MED ORDER — POTASSIUM CHLORIDE 10 MEQ/50ML IV SOLN
10.0000 meq | INTRAVENOUS | Status: AC
  Administered 2020-07-21 (×2): 10 meq via INTRAVENOUS
  Filled 2020-07-21: qty 50

## 2020-07-21 MED ORDER — ENOXAPARIN SODIUM 30 MG/0.3ML ~~LOC~~ SOLN
30.0000 mg | SUBCUTANEOUS | Status: DC
  Administered 2020-07-21 – 2020-07-26 (×6): 30 mg via SUBCUTANEOUS
  Filled 2020-07-21 (×6): qty 0.3

## 2020-07-21 MED ORDER — INSULIN ASPART 100 UNIT/ML ~~LOC~~ SOLN
0.0000 [IU] | Freq: Three times a day (TID) | SUBCUTANEOUS | Status: DC
  Administered 2020-07-21 – 2020-07-22 (×2): 2 [IU] via SUBCUTANEOUS

## 2020-07-21 MED ORDER — POTASSIUM CHLORIDE 10 MEQ/50ML IV SOLN
INTRAVENOUS | Status: AC
  Filled 2020-07-21: qty 50

## 2020-07-21 MED ORDER — POTASSIUM CHLORIDE 10 MEQ/50ML IV SOLN
10.0000 meq | INTRAVENOUS | Status: AC
  Administered 2020-07-21 (×3): 10 meq via INTRAVENOUS
  Filled 2020-07-21 (×3): qty 50

## 2020-07-21 NOTE — Hospital Course (Addendum)
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  He was originally seen in consultation on July 08, 2020.  Since then the patient underwent routine blood work, carotid duplex scan, and a formal physical therapy evaluation.  Blood work revealed normal serum albumin with slightly reduced prealbumin and normal electrolytes with exception of mild hyponatremia.  Carotid duplex scan revealed no signs of significant extracranial cerebrovascular disease.  On physical examination the patient's lower extremities displayed mild weakness (grade 4+ out of 5) with shuffling gait.  The patient ambulated 565 feet during a 6-minute walk test with what was felt to be fairly light rate of exertion.  Based on the short physical performance battery the patient's frailty rating was graded 8/12 with less than 5/12 considered frail.   The patient and his wife return to the office to discuss treatment options further.  The patient's only new concern over the past 2 weeks is that he feels he is not tolerating oral Lipitor due to change in bowel function.  He has stopped taking it.  Patient has stage D1 very severe, symptomatic aortic stenosis as well as significant left main and three-vessel coronary artery disease.  Although the patient denies any symptoms of exertional shortness of breath or chest discomfort, he has a long history of progressive and quite severe generalized fatigue with exhaustion, loss of appetite, and weight loss.  I feel the patient has critical aortic stenosis and coronary artery disease with profound generalized weakness and cardiac cachexia, although the patient's symptoms may be at least partially related to severe depression.   Dr. Roxy Manns reviewed the patient's  recent transthoracic echocardiogram, diagnostic cardiac catheterization, and CT angiograms.  Echocardiogram reveals normal left ventricular systolic function with very severe aortic stenosis.  The aortic valve is trileaflet with severe thickening, calcification, and restricted leaflet mobility involving all 3 leaflets of the aortic valve.  Peak velocity across aortic valve measured close to 5.5 m/s corresponding to mean transvalvular gradient estimated 77 mmHg and aortic valve area calculated only 0.59 cm with DVI reported 0.19.  Diagnostic cardiac catheterization revealed significant left main and three-vessel coronary artery disease with chronic occlusion of the proximal right coronary artery, left to right collateral filling of the terminal branches of the right coronary artery, and at least 50% stenosis of the left main coronary artery.  Under the circumstances, conventional surgical aortic valve replacement with coronary artery bypass grafting would clearly provide the best long-term result.  However, risks associated with surgery would clearly be significantly impacted by the patient's generalized weakness, malnutrition, and depression.  Cardiac-gated CTA of the heart reveals anatomical characteristics consistent with aortic stenosis suitable for treatment by transcatheter aortic valve replacement without any significant complicating features and CTA of the aorta and iliac vessels demonstrate what appears to be adequate pelvic vascular access to facilitate a transfemoral approach.  EKG reveals sinus rhythm without significant AV conduction delay.  Coronary bypass grafting with aortic valve replacement were offered to the patient and discussed with him in detail by Dr. Roxy Manns.  Decision was made to proceed with surgery.  Course in Hospital: Mr. Nettleton was admitted to the hospital for elective surgery on 07/19/2020.  He was taken to the operating room where coronary bypass grafting x4 was accomplished with  the left internal mammary artery grafted to  the distal left anterior descending coronary artery and saphenous vein was grafted to the distal right coronary artery, first obtuse marginal coronary artery, and third obtuse marginal coronary artery.  The aortic valve was replaced with a 53mm Edwards Inspiris Resilia bovine pericardial tissue valve.  Following the procedure, patient was transported to the cardiovascular ICU.  He remained hemodynamically stable but had significant bloody drainage from the chest tubes.  For this reason, he was returned to the operating room during the evening of 07/19/2020 for mediastinal exploration.  There was some bleeding identified at the mammary harvest bed which was controlled with electrocautery.  The patient's coagulopathy was corrected with infusions of 1 platelet pheresis, 4 units of fresh frozen plasma, and a single half dose of recombinant NovoSeven.  This resulted in satisfactory hemostasis.  The patient was returned to the cardiovascular ICU where he remained hemodynamically stable.  He was weaned from mechanical ventilation and extubated in the early morning of postop day 1.  He was started back on his usual psychotropic agents.  He did develop some confusion and paranoia while in the ICU.  This has improved over time and he is back at what appears to be is baseline mental status.  All routine lines, monitors and drainage devices have been discontinued in the standard fashion.  He has maintained normal sinus rhythm and pacer wires have been removed as well.  He did have some postoperative hypertension and was started on ACE inhibitor with good results.  He does have moderate volume overload but is responding well to diuretics.  Renal function has remained within normal limits.  He does have an expected acute blood loss anemia which is mild and stable.  Blood sugars have been under good control.  Incisions are healing well without evidence of infection.  He is tolerating diet.   He does have some physical limitations but is responding well to physical therapy and it was recommended that he spend some time with CIR which we are awaiting approval for.

## 2020-07-21 NOTE — Progress Notes (Addendum)
2 Days Post-Op Procedure(s) (LRB): EXPLORATION POST OPERATIVE OPEN HEART (N/A) Subjective: Some incisional pain "I was held against my will last night."  Objective: Vital signs in last 24 hours: Temp:  [97.6 F (36.4 C)-99.3 F (37.4 C)] 97.8 F (36.6 C) (08/08 0728) Pulse Rate:  [78-90] 88 (08/08 0700) Cardiac Rhythm: Atrial paced (08/08 0400) Resp:  [0-30] 15 (08/08 0700) BP: (98-155)/(60-108) 148/72 (08/08 0700) SpO2:  [91 %-98 %] 98 % (08/08 0700) Arterial Line BP: (120-165)/(42-69) 120/47 (08/07 1200) Weight:  [65.8 kg] 65.8 kg (08/08 0500)  Hemodynamic parameters for last 24 hours: PAP: (24-37)/(5-15) 31/10  Intake/Output from previous day: 08/07 0701 - 08/08 0700 In: 860.3 [P.O.:480; I.V.:116.7; IV Piggyback:263.6] Out: 2070 [Urine:770; Chest Tube:1300] Intake/Output this shift: Total I/O In: 39.3 [IV Piggyback:39.3] Out: -   General appearance: alert and no distress Neurologic: confusion, no focal deficit Heart: regular rate and rhythm and + rub Lungs: diminished breath sounds bibasilar Abdomen: normal findings: soft, non-tender  Lab Results: Recent Labs    07/20/20 1731 07/21/20 0338  WBC 13.2* 17.8*  HGB 11.3* 11.7*  HCT 32.9* 34.1*  PLT 120* 114*   BMET:  Recent Labs    07/20/20 1731 07/21/20 0338  NA 137 139  K 3.8 3.5  CL 102 103  CO2 25 26  GLUCOSE 207* 100*  BUN 18 21  CREATININE 1.09 0.99  CALCIUM 8.4* 8.5*    PT/INR:  Recent Labs    07/19/20 1917  LABPROT 11.1*  INR 0.8   ABG    Component Value Date/Time   PHART 7.374 07/20/2020 0727   HCO3 27.7 07/20/2020 0727   TCO2 29 07/20/2020 0727   ACIDBASEDEF 1.0 07/20/2020 0605   O2SAT 98.0 07/20/2020 0727   CBG (last 3)  Recent Labs    07/20/20 2344 07/21/20 0351 07/21/20 0726  GLUCAP 103* 117* 187*    Assessment/Plan: S/P Procedure(s) (LRB): EXPLORATION POST OPERATIVE OPEN HEART (N/A) -POD # 2  NEURO/PSYCH- still some confusion and paranoia. He is a little more  cooperative at least with me this AM. Still paranoid with staff and refusing meds/ interventions. Reorients more easily. Continue psych meds. Reorient.  CV- in SR RESP_ CXR OK, IS if he will cooperate RENAL- creatinine OK, supplement K ENDO- change CBG/ SSI to [facility] and HS Dc central line Leave CT one more day as still moderately high output Will start low dose lovenox today PT consult    LOS: 2 days    Melrose Nakayama 07/21/2020

## 2020-07-21 NOTE — Progress Notes (Signed)
      [address]       [place] [address]             [phone]      Talking with his wife on the phone Still some confusion and paranoia, but better than yesterday  BP (!) 163/84   Pulse 93   Temp (!) 97.4 F (36.3 C) (Oral)   Resp 19   Ht 5\' 5"  (1.651 m)   Wt 65.8 kg   SpO2 97%   BMI 24.14 kg/m   Intake/Output Summary (Last 24 hours) at 07/21/2020 1928 Last data filed at 07/21/2020 1900 Gross per 24 hour  Intake 1365.99 ml  Output 1700 ml  Net -334.01 ml   Continue present care  Remo Lipps C. Roxan Hockey, MD [facility] [phone]

## 2020-07-22 ENCOUNTER — Inpatient Hospital Stay (HOSPITAL_COMMUNITY): Payer: Medicare HMO

## 2020-07-22 ENCOUNTER — Other Ambulatory Visit: Payer: Self-pay | Admitting: Medical

## 2020-07-22 ENCOUNTER — Ambulatory Visit: Payer: Medicare HMO | Admitting: Psychiatry

## 2020-07-22 ENCOUNTER — Telehealth: Payer: Self-pay | Admitting: General Practice

## 2020-07-22 ENCOUNTER — Encounter (HOSPITAL_COMMUNITY): Payer: Self-pay | Admitting: Thoracic Surgery (Cardiothoracic Vascular Surgery)

## 2020-07-22 DIAGNOSIS — I350 Nonrheumatic aortic (valve) stenosis: Secondary | ICD-10-CM

## 2020-07-22 LAB — CBC
HCT: 34.5 % — ABNORMAL LOW (ref 39.0–52.0)
Hemoglobin: 11.7 g/dL — ABNORMAL LOW (ref 13.0–17.0)
MCH: 30.7 pg (ref 26.0–34.0)
MCHC: 33.9 g/dL (ref 30.0–36.0)
MCV: 90.6 fL (ref 80.0–100.0)
Platelets: 109 10*3/uL — ABNORMAL LOW (ref 150–400)
RBC: 3.81 MIL/uL — ABNORMAL LOW (ref 4.22–5.81)
RDW: 13.7 % (ref 11.5–15.5)
WBC: 15.5 10*3/uL — ABNORMAL HIGH (ref 4.0–10.5)
nRBC: 0 % (ref 0.0–0.2)

## 2020-07-22 LAB — SURGICAL PATHOLOGY

## 2020-07-22 LAB — GLUCOSE, CAPILLARY
Glucose-Capillary: 150 mg/dL — ABNORMAL HIGH (ref 70–99)
Glucose-Capillary: 116 mg/dL — ABNORMAL HIGH (ref 70–99)
Glucose-Capillary: 135 mg/dL — ABNORMAL HIGH (ref 70–99)

## 2020-07-22 LAB — BASIC METABOLIC PANEL
Anion gap: 8 (ref 5–15)
BUN: 23 mg/dL (ref 8–23)
CO2: 25 mmol/L (ref 22–32)
Calcium: 8.4 mg/dL — ABNORMAL LOW (ref 8.9–10.3)
Chloride: 101 mmol/L (ref 98–111)
Creatinine, Ser: 1 mg/dL (ref 0.61–1.24)
GFR calc Af Amer: >60 mL/min (ref >60)
GFR calc non Af Amer: >60 mL/min (ref >60)
Glucose, Bld: 166 mg/dL — ABNORMAL HIGH (ref 70–99)
Potassium: 4.4 mmol/L (ref 3.5–5.1)
Sodium: 134 mmol/L — ABNORMAL LOW (ref 135–145)

## 2020-07-22 MED ORDER — ROSUVASTATIN CALCIUM 5 MG PO TABS
10.0000 mg | ORAL_TABLET | Freq: Every day | ORAL | Status: DC
  Administered 2020-07-23 – 2020-07-26 (×4): 10 mg via ORAL
  Filled 2020-07-22 (×4): qty 2

## 2020-07-22 MED ORDER — FUROSEMIDE 10 MG/ML IJ SOLN
20.0000 mg | Freq: Two times a day (BID) | INTRAMUSCULAR | Status: DC
  Administered 2020-07-22 (×2): 20 mg via INTRAVENOUS
  Filled 2020-07-22 (×2): qty 2

## 2020-07-22 MED ORDER — METOPROLOL TARTRATE 25 MG PO TABS
25.0000 mg | ORAL_TABLET | Freq: Two times a day (BID) | ORAL | Status: DC
  Administered 2020-07-22 – 2020-07-26 (×8): 25 mg via ORAL
  Filled 2020-07-22 (×10): qty 1

## 2020-07-22 MED ORDER — LISINOPRIL 10 MG PO TABS
10.0000 mg | ORAL_TABLET | Freq: Every day | ORAL | Status: DC
  Administered 2020-07-22 – 2020-07-26 (×5): 10 mg via ORAL
  Filled 2020-07-22 (×6): qty 1

## 2020-07-22 MED ORDER — POTASSIUM CHLORIDE CRYS ER 20 MEQ PO TBCR
20.0000 meq | EXTENDED_RELEASE_TABLET | Freq: Every day | ORAL | Status: DC
  Administered 2020-07-22: 20 meq via ORAL
  Filled 2020-07-22: qty 1

## 2020-07-22 MED ORDER — ~~LOC~~ CARDIAC SURGERY, PATIENT & FAMILY EDUCATION: Freq: Once | Status: AC

## 2020-07-22 MED ORDER — FUROSEMIDE 10 MG/ML IJ SOLN: 20.0000 mg | Freq: Two times a day (BID) | INTRAMUSCULAR | Status: DC

## 2020-07-22 NOTE — Progress Notes (Signed)
3 Days Post-Op Procedure(s) (LRB): EXPLORATION POST OPERATIVE OPEN HEART (N/A) Subjective: No complaints  Objective: Vital signs in last 24 hours: Temp:  [98 F (36.7 C)-100.4 F (38 C)] 100 F (37.8 C) (08/09 0800) Pulse Rate:  [88-97] 88 (08/09 1238) Cardiac Rhythm: Normal sinus rhythm (08/09 1200) Resp:  [12-26] 25 (08/09 1730) BP: (102-165)/(68-86) 134/71 (08/09 1730) SpO2:  [94 %-99 %] 94 % (08/09 1238) Weight:  [67.4 kg] 67.4 kg (08/09 0600)  Hemodynamic parameters for last 24 hours:    Intake/Output from previous day: 08/08 0701 - 08/09 0700 In: 2099.9 [P.O.:1440; I.V.:520.5; IV Piggyback:139.4] Out: 1300 [Urine:680; Chest Tube:620] Intake/Output this shift: Total I/O In: 860 [P.O.:860] Out: 1830 [Urine:1800; Chest Tube:30]  General appearance: alert and cooperative Neurologic: intact Heart: regular rate and rhythm, S1, S2 normal, no murmur, click, rub or gallop Wound: c/d/i atrial pacing wires cleansed with betadine and gently pulled, cutting the exposable wire at the skin level  Lab Results: Recent Labs    07/21/20 0338 07/22/20 0627  WBC 17.8* 15.5*  HGB 11.7* 11.7*  HCT 34.1* 34.5*  PLT 114* 109*   BMET:  Recent Labs    07/21/20 0338 07/22/20 0627  NA 139 134*  K 3.5 4.4  CL 103 101  CO2 26 25  GLUCOSE 100* 166*  BUN 21 23  CREATININE 0.99 1.00  CALCIUM 8.5* 8.4*    PT/INR:  Recent Labs    07/19/20 1917  LABPROT 11.1*  INR 0.8   ABG    Component Value Date/Time   PHART 7.374 07/20/2020 0727   HCO3 27.7 07/20/2020 0727   TCO2 29 07/20/2020 0727   ACIDBASEDEF 1.0 07/20/2020 0605   O2SAT 98.0 07/20/2020 0727   CBG (last 3)  Recent Labs    07/22/20 0644 07/22/20 1134 07/22/20 1637  GLUCAP 150* 116* 135*    Assessment/Plan: S/P Procedure(s) (LRB): EXPLORATION POST OPERATIVE OPEN HEART (N/A) continue present management.    LOS: 3 days    Neil [name] 07/22/2020

## 2020-07-22 NOTE — Progress Notes (Addendum)
[facility] DAILY ICU PROGRESS NOTE                   [address]            [place] [address]          [phone]   3 Days Post-Op Procedure(s) (LRB): EXPLORATION POST OPERATIVE OPEN HEART (N/A)  Total Length of Stay:  LOS: 3 days   Subjective: Feels "afraid"  Objective: Vital signs in last 24 hours: Temp:  [97.4 F (36.3 C)-100.4 F (38 C)] 100.4 F (38 C) (08/09 0627) Pulse Rate:  [80-187] 97 (08/09 0518) Cardiac Rhythm: Normal sinus rhythm (08/08 2000) Resp:  [11-26] 25 (08/09 0627) BP: (142-165)/(67-86) 142/71 (08/09 0627) SpO2:  [95 %-99 %] 95 % (08/09 0518) Weight:  [67.4 kg] 67.4 kg (08/09 0600)  Filed Weights   07/20/20 0500 07/21/20 0500 07/22/20 0600  Weight: 68.4 kg 65.8 kg 67.4 kg    Weight change: 1.6 kg   Hemodynamic parameters for last 24 hours:    Intake/Output from previous day: 08/08 0701 - 08/09 0700 In: 2099.9 [P.O.:1440; I.V.:520.5; IV Piggyback:139.4] Out: 1300 [Urine:680; Chest Tube:620]  Intake/Output this shift: No intake/output data recorded.  Current Meds: Scheduled Meds: . acetaminophen  1,000 mg Oral Q6H  . bisacodyl  10 mg Oral Daily   Or  . bisacodyl  10 mg Rectal Daily  . chlorhexidine gluconate (MEDLINE KIT)  15 mL Mouth Rinse BID  . Chlorhexidine Gluconate Cloth  6 each Topical Daily  . Chlorhexidine Gluconate Cloth  6 each Topical Daily  . clopidogrel  75 mg Oral Daily  . [facility] Family Education   Does not apply Once  . Deutetrabenazine  18 mg Oral BID  . docusate sodium  200 mg Oral Daily  . enoxaparin (LOVENOX) injection  30 mg Subcutaneous Q24H  . furosemide  20 mg Intravenous BID  . metoprolol tartrate  25 mg Oral BID  . pantoprazole  40 mg Oral Daily  . potassium chloride  20 mEq Oral Daily  . [START ON 07/23/2020] rosuvastatin  10 mg Oral Daily  . sertraline  75 mg Oral Daily  . sodium chloride flush  10-40 mL Intracatheter Q12H  . sodium chloride flush  3 mL  Intravenous Q12H   Continuous Infusions: . sodium chloride    . lactated ringers 10 mL/hr (07/19/20 1957)   PRN Meds:.haloperidol lactate, hydrOXYzine, labetalol, LORazepam, metoprolol tartrate, morphine injection, ondansetron (ZOFRAN) IV, oxyCODONE, sodium chloride flush, sodium chloride flush, traMADol  General appearance: alert, cooperative and no distress Heart: regularly irregular rhythm and occas extresystole, + rub with CT's in place Lungs: mildly dim in bases Abdomen: soft, nontender Extremities: + LE edema Wound: incis healing well( evh), chest dressing in place  Lab Results: CBC: Recent Labs    07/21/20 0338 07/22/20 0627  WBC 17.8* 15.5*  HGB 11.7* 11.7*  HCT 34.1* 34.5*  PLT 114* 109*   BMET:  Recent Labs    07/21/20 0338 07/22/20 0627  NA 139 134*  K 3.5 4.4  CL 103 101  CO2 26 25  GLUCOSE 100* 166*  BUN 21 23  CREATININE 0.99 1.00  CALCIUM 8.5* 8.4*    CMET: Lab Results  Component Value Date   WBC 15.5 (H) 07/22/2020   HGB 11.7 (L) 07/22/2020   HCT 34.5 (L) 07/22/2020   PLT 109 (L) 07/22/2020   GLUCOSE 166 (H) 07/22/2020   ALT 22 07/17/2020   AST  18 07/17/2020   NA 134 (L) 07/22/2020   K 4.4 07/22/2020   CL 101 07/22/2020   CREATININE 1.00 07/22/2020   BUN 23 07/22/2020   CO2 25 07/22/2020   INR 0.8 07/19/2020   HGBA1C 5.3 07/17/2020      PT/INR:  Recent Labs    07/19/20 1917  LABPROT 11.1*  INR 0.8   Radiology: [facility] Chest Port 1 View  Result Date: 07/22/2020 CLINICAL DATA:  Open heart surgery.  CABG. EXAM: PORTABLE CHEST 1 VIEW COMPARISON:  07/21/2020 FINDINGS: Normal cardiac silhouette. Bilateral chest tubes in 2 mediastinal drains unchanged. Removal of IJ sheath. Trace apical pneumothorax at the LEFT lung apex again noted. RIGHT apical pneumothorax not appreciated. Lungs are well inflated. Discoid atelectasis in the mid LEFT lung. Prosthetic heart valve noted. IMPRESSION: 1. Bilateral chest tubes in place with trace LEFT apical  pneumothorax unchanged in volume. 2. Lungs are well expanded.  Mild atelectasis. Electronically Signed   By: Suzy Bouchard M.D.   On: 07/22/2020 08:05     Assessment/Plan: S/P Procedure(s) (LRB): EXPLORATION POST OPERATIVE OPEN HEART (N/A)  1 doing well POD#3 2 hypertensive on no gtts, will start low dose ACE-I 3 sats good on RA 4 tmax 100.3, leukocytosis trend is improving 5 CXR stable with left apical pntx and mild atx 6 ABL anemia - expected is stable 7 CT drainage 620/24- keep in place 8 renal fxn normal, fair UOP, lasic IV BID started 9 Routine pulm toilet/rehab as able 10 some BS variability- no DM , cont SSI for now 11 will need to see if he will take statin- was not taking at home 12 psych meds have been restarted     John Giovanni PA-C Pager [phone] 07/22/2020 8:09 AM    I have seen and examined the patient and agree with the assessment and plan as outlined.  Mental status and speech patterns approaching preop baseline.  Confusion and agitation seems to have resolved.  Maintaining NSR - will increase metoprolol and try adding ACE-I.  D/C pacing wires and chest tubes.  Mobilize.  Diuresis.  Will try substituting Cresor for Lipitor.  Possible transfer 4E soon if he continues to do well.  Rexene Alberts, MD 07/22/2020 10:15 AM

## 2020-07-22 NOTE — Evaluation (Addendum)
Physical Therapy Evaluation Patient Details Name: Neil [name] MRN: [id number] DOB: 1941-10-23 Today's Date: 07/22/2020   History of Present Illness  Pt is a 79 y.o. male with aortic stenosis and CAD, admitted 07/19/20 for CABG, AVR, TEE. Course complicated by postop bleeding with immediate return to OR for reexploration of bleeding 8/6. Extubated 8/7. Pt with some AMS and confusion. Other PMH includes anxiety, depression, schizophrenia.    Clinical Impression  Pt presents with an overall decrease in functional mobility secondary to above. PTA, pt independent and lives with wife and son, retired Theme park manager. Today, pt able to initiate transfer and gait training, requiring HHA and up to maxA to prevent fall with hallway ambulation. Pt with poor safety awareness, impaired problem solving and decreased attention. Spoke with wife regarding d/c recommendations; will have family available 24/7. Pt very pleasant and motivated to participate. Pt would benefit from intensive CIR-level therapies to maximize functional mobility and independence prior to return home.    Follow Up Recommendations CIR;Supervision/Assistance - 24 hour    Equipment Recommendations  TBD   Recommendations for Other Services       Precautions / Restrictions Precautions Precautions: Fall;Sternal Precaution Booklet Issued: No Precaution Comments: Initiated education      Mobility  Bed Mobility Overal bed mobility: Needs Assistance Bed Mobility: Rolling;Sidelying to Sit Rolling: Supervision Sidelying to sit: Mod assist       General bed mobility comments: ModA to assist trunk elevation from sidelying  Transfers Overall transfer level: Needs assistance Equipment used: Rolling walker (2 wheeled);None Transfers: Sit to/from Stand Sit to Stand: Mod assist         General transfer comment: Initial modA to stand to RW from EOB, pt with significant posterior lean and difficulty correct despite taking steps forward;  additional trials from bed, toilet and recliner with HHA, modA for stability but much improved posterior lean  Ambulation/Gait Ambulation/Gait assistance: Min assist;Mod assist;Max assist Gait Distance (Feet): 80 Feet Assistive device: Rolling walker (2 wheeled);1 person hand held assist Gait Pattern/deviations: Step-through pattern;Decreased stride length;Drifts right/left;Trunk flexed Gait velocity: Decreased   General Gait Details: Amb 4' with RW and significant posterior lean, trialled with HHA and much improved; slow, unsteady gait with HHA and consistent min-modA for stability. Pt c/o needing to have BM and starting to sit unannounced in hallway requriing max-totalA to prevent fall and assist safely to chair; poor safety awareness  Stairs            Wheelchair Mobility    Modified Rankin (Stroke Patients Only)       Balance Overall balance assessment: Needs assistance   Sitting balance-Leahy Scale: Fair       Standing balance-Leahy Scale: Poor Standing balance comment: Reliant on HHA and external assist; dependent for posterior pericare                             Pertinent Vitals/Pain Pain Assessment: Faces Faces Pain Scale: Hurts a little bit Pain Location: Sternal incision Pain Descriptors / Indicators: Discomfort Pain Intervention(s): Monitored during session    Home Living Family/patient expects to be discharged to:: Private residence Living Arrangements: Spouse/significant other;Children Available Help at Discharge: Family;Available 24 hours/day Type of Home: House Home Access: Stairs to enter Entrance Stairs-Rails: Right Entrance Stairs-Number of Steps: 1 Home Layout: Two level;Bed/bath upstairs        Prior Function Level of Independence: Independent         Comments: Pt reports independent  with mobility and ADLs. Retired Environmental education officer. Wife reports "he's not the most stable normally"     Hand Dominance        Extremity/Trunk  Assessment   Upper Extremity Assessment Upper Extremity Assessment: Generalized weakness    Lower Extremity Assessment Lower Extremity Assessment: Generalized weakness       Communication   Communication: HOH  Cognition Arousal/Alertness: Awake/alert Behavior During Therapy: WFL for tasks assessed/performed Overall Cognitive Status: Impaired/Different from baseline Area of Impairment: Attention;Memory;Following commands;Safety/judgement;Awareness;Problem solving                   Current Attention Level: Sustained Memory: Decreased short-term memory;Decreased recall of precautions Following Commands: Follows one step commands with increased time;Follows one step commands inconsistently Safety/Judgement: Decreased awareness of safety;Decreased awareness of deficits Awareness: Intellectual Problem Solving: Slow processing;Decreased initiation;Difficulty sequencing;Requires verbal cues        General Comments General comments (skin integrity, edema, etc.): Spoke with wife regarding pt's current status and recommendation for post-acute rehab; pt's wife was hopeful he would d/c home    Exercises     Assessment/Plan    PT Assessment Patient needs continued PT services  PT Problem List Decreased strength;Decreased activity tolerance;Decreased balance;Decreased mobility;Decreased cognition;Decreased knowledge of use of DME;Decreased safety awareness;Decreased knowledge of precautions;Cardiopulmonary status limiting activity       PT Treatment Interventions DME instruction;Gait training;Stair training;Functional mobility training;Therapeutic activities;Therapeutic exercise;Balance training;Patient/family education;Cognitive remediation    PT Goals (Current goals can be found in the Care Plan section)  Acute Rehab PT Goals Patient Stated Goal: Hopeful for return home but willing to consider rehab PT Goal Formulation: With family Time For Goal Achievement:  08/05/20 Potential to Achieve Goals: Good    Frequency Min 3X/week   Barriers to discharge        Co-evaluation               AM-PAC PT "6 Clicks" Mobility  Outcome Measure Help needed turning from your back to your side while in a flat bed without using bedrails?: A Little Help needed moving from lying on your back to sitting on the side of a flat bed without using bedrails?: A Lot Help needed moving to and from a bed to a chair (including a wheelchair)?: A Lot Help needed standing up from a chair using your arms (e.g., wheelchair or bedside chair)?: A Lot Help needed to walk in hospital room?: A Lot Help needed climbing 3-5 steps with a railing? : A Lot 6 Click Score: 13    End of Session Equipment Utilized During Treatment: Gait belt Activity Tolerance: Patient tolerated treatment well Patient left: in chair;with call bell/phone within reach;with nursing/sitter in room Nurse Communication: Mobility status PT Visit Diagnosis: Other abnormalities of gait and mobility (R26.89);Muscle weakness (generalized) (M62.81)    Time: 7048-8891 PT Time Calculation (min) (ACUTE ONLY): 26 min   Charges:   PT Evaluation $PT Eval Moderate Complexity: 1 Mod PT Treatments $Gait Training: 8-22 mins   Mabeline Caras, PT, DPT Acute Rehabilitation Services  Pager [phone] Office [phone]  Derry Lory 07/22/2020, 12:29 PM

## 2020-07-22 NOTE — Plan of Care (Signed)

## 2020-07-22 NOTE — Telephone Encounter (Signed)
Pt is scheduled for a [facility] hospital f/u with Coletta Memos on 08/07/20 at 2:15 PM per Roby Lofts.

## 2020-07-22 NOTE — Discharge Summary (Signed)
Physician Discharge Summary  Patient ID: Neil [name] MRN: [id number] DOB/AGE: 1941-08-30 79 y.o.  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  . CAD (coronary artery disease)   . Anxiety   . Coronary artery disease involving native coronary artery of native heart without angina pectoris   . Depression   . Dyslipidemia   . Severe aortic stenosis 06/26/2020   HPI:at time of surgical evaluation  Patient is 80 year old male with history of aortic stenosis, hyperlipidemia, depression and anxiety who has been referred for surgical consultation to discuss treatment options for management of very severe aortic stenosis with left main and multivessel coronary artery disease.  Patient states that he has been told that he has had a heart murmur nearly all of his life.He has been followed in the past by his primary care physician. Recent follow-up transthoracic echocardiogram revealed findings consistent with severe aortic stenosis and preserved left ventricular function. The patient was referred for cardiology consultation and initially evaluated by Dr. Debara Pickett. Repeat echocardiogram was performed May 28, 2020 and revealed findings consistent with very severe aortic stenosis including peak velocity across aortic valve measured nearly 5.5 m/s corresponding to mean transvalvular gradient estimated 77 mmHg and aortic valve area  calculated only 0.59 cm with DVI reported 0.19. Left ventricular systolic function remain normal with ejection fraction estimated 60 to 65%. The patient was referred to the multidisciplinary heart valve clinic and has been evaluated previously by Dr. Burt Knack. Diagnostic cardiac catheterization was performed June 26, 2020 and revealed significant left main and three-vessel coronary artery disease. The aortic valve was not crossed at the time of catheterization. Right heart pressures were normal. CT angiography was performed and cardiothoracic surgical consultation requested.  Patient is married and lives locally in [place] with his wife. 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 Approximately 2 years ago he stopped this because of worsening fatigue. Since that time the patient has complained of having absolutely no energy. He stopped doing things outside of the house as he found that he just could not do much of anything without being immediately exhausted. Symptoms were blamed on medications used for depression. The patient lost his appetite approximately 7 months ago and has apparently lost about 25 pounds in weight. He has become progressively weak and fatigued. He continues to deny any symptoms of exertional shortness of breath or chest discomfort, but he admits that he does not do much of anything at home. He denies any history of PND, orthopnea, or lower extremity edema. He has not had dizzy spells or syncope. He reportedly has some difficulty walking due to poor balance and generalized weakness.  Patient was originally seen in consultation on July 08, 2020. Since then the patient underwent routine blood work, carotid duplex scan, and a formal physical therapy evaluation. Blood work  revealed normal serum albumin with slightly reduced prealbumin and normal electrolytes  with exception of mild hyponatremia. Carotid duplex scan revealed no signs of significant extracranial cerebrovascular disease. On physical examination the patient's lower extremities displayed mild weakness (grade 4+ out of 5) with shuffling gait. The patient ambulated 565 feet during a 6-minute walk test with what was felt to be fairly light rate of exertion.Based on the short physical performance battery the patient's frailty rating was graded 8/12 with less than 5/12 considered frail.  The patient and his wife return to the office today to discuss treatment options further. The patient's only new concern over the past 2 weeks is that he feels he is not tolerating oral Lipitor due to change in bowel function. He has stopped taking it.  Dr. Roxy Manns evaluated the patient and all of his studies recommended proceeding with surgical intervention.  He was admitted this hospitalization for the procedure.  Discharged Condition: good  Hospital Course: Patient was admitted electively and on 07/19/2020 he was taken the operating room where he underwent the below described procedure.  He tolerated it well was taken to the surgical intensive care unit in stable condition.  Postoperative hospital course:   Patient did develop a moderate amount of postoperative bleeding and coagulopathy.  It was felt that the patient should return to the operating room for reexploration.  This was done on the evening of surgery and the findings are listed below.  He received a single half dose of recombinant NovoSeven as well as blood and multiple products.  He was returned to the SICU in very stable condition.  Patient is overall progressed well.  He has remained hemodynamically stable off inotropes and pressor support.  He was weaned from the ventilator using standard protocols without difficulty.  Swan and A-line were discontinued on postoperative day #1.  He did have some early postoperative confusion but appears to be  returning to his baseline neurological status.  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  He has a known history of schizophrenia and has been resumed on his preoperative psychiatric medications.  He has maintained normal sinus rhythm in the postoperative period.  He has had some hypertension and has been started on an ACE inhibitor.  He does have postoperative volume overload but is responding well to diuretics.  Renal function has remained within normal limits.  He does have an expected acute blood loss anemia which is very mild and stable.  Blood sugars have been under good control.  Incisions are noted to be healing well without evidence of infection.  He is tolerating diet.  He is tolerating gradually increasing activities with cardiac rehab and physical therapy.  Initially he was recommended inpatient CIR but family wants to take him home we will arrange home health PT/OT/aide in addition to durable medical equipment that he will need.  He is felt to be stable for discharge on today's date.   Consults: None  Significant Diagnostic Studies: Routine postop labs and serial chest x-rays  Treatments: surgery:    Rexene Alberts, MD  Physician  Cardiothoracic Surgery  Op Note     Addendum  Date of Service:  07/19/2020 2:43 PM           CARDIOTHORACIC SURGERY OPERATIVE NOTE  Date of Procedure:                07/19/2020  Preoperative Diagnosis:       ? Severe Aortic  Stenosis ? 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  Postoperative Diagnosis:    Same  Procedure:        Aortic Valve Replacement             Edwards Inspiris Resilia Stented Bovine Pericardial Tissue Valve (size 34mm, ref # [id number], serial # [id number])             Aortic root enlargement using bovine pericardial patch (Nicks' technique)   Coronary Artery Bypass Grafting x 4             Left Internal Mammary Artery to Distal Left Anterior Descending Coronary  Artery             Saphenous Vein Graft to Distal Right Coronary Artery             Saphenous Vein Graft to First Obtuse Marginal Branch of Left Circumflex Coronary Artery             Sequential Saphenous Vein Graft to Third Obtuse Marginal Branch of Left Circumflex Coronary Artery             Endoscopic Vein Harvest from Left Thigh  Surgeon:        Valentina Gu. Roxy Manns, MD  Assistant:       John Giovanni, PA-C  Anesthesia:    Arabella Merles, MD  Operative Findings: ? Severe aortic stenosis ? Normal left ventricular systolic function ? Moderate-severe left ventricular hypertrophy  ? Good quality left internal mammary artery conduit ? Good quality saphenous vein conduit ? Good quality target vessels for grafting             Revision History                     Routing History                Note Details  Author Rexene Alberts, MD File Time 07/19/2020 3:28 PM  Author Type Physician Status Addendum  Last Editor Rexene Alberts, MD Service Cardiothoracic Surgery  Hospital Acct # [id number] Admit Date 07/19/2020   Second procedure:  CARDIOTHORACIC SURGERY OPERATIVE NOTE  Date of Procedure:                            07/19/2020  Preoperative Diagnosis:                  Bleeding s/p Aortic Valve Replacement and Coronary Artery Bypass Grafting  Postoperative Diagnosis:                same  Procedure:                                         Reexploration of chest s/p Aortic Valve Replacement and Coronary Artery Bypass Grafting   Surgeon:                                            Valentina Gu. Roxy Manns, MD  Assistant:                                           Beverlee Nims  Jonetta Speak, CRNFA  Anesthesia:                                        Arabella Merles  Operative Findings:                          Diffuse coagulopathy with bleeding from chest wall   Discharge Exam: Blood pressure 125/60, pulse 69, temperature 98.6 F (37 C), temperature source  Oral, resp. rate 20, height 5\' 5"  (1.651 m), weight 64.6 kg, SpO2 92 %.  General appearance: alert, cooperative and no distress Heart: regular rate and rhythm Lungs: clear to auscultation bilaterally and doesnt't take deep breaths Abdomen: benign Extremities: minor edema Wound: incis healing well Disposition: Discharge disposition: 01-Home or Self Care      Discharge Instructions    AMB Referral to Cardiac Rehabilitation - Phase II   Complete by: As directed    Diagnosis:  CABG Valve Replacement     Valve: Aortic   CABG X ___: 4   After initial evaluation and assessments completed: Virtual Based Care may be provided alone or in conjunction with Phase 2 Cardiac Rehab based on patient barriers.: Yes   Discharge patient   Complete by: As directed    Discharge disposition: 01-Home or Self Care   Discharge patient date: 07/26/2020     Allergies as of 07/26/2020      Reactions   Aspirin Swelling, Other (See Comments)   Tongue swelling   Sulfur Other (See Comments)   Reaction:  Unknown    Tetanus Toxoids Swelling, Other (See Comments)   Arm swelling      Medication List    STOP taking these medications   atorvastatin 80 MG tablet Commonly known as: LIPITOR     TAKE these medications   [name] 9 MG Tabs Generic drug: Deutetrabenazine Take 18 mg by mouth in the morning and at bedtime.   clopidogrel 75 MG tablet Commonly known as: PLAVIX Take 1 tablet (75 mg total) by mouth daily. Start taking on: July 27, 2020   furosemide 40 MG tablet Commonly known as: LASIX Take 1 tablet (40 mg total) by mouth daily for 3 days. Start taking on: July 27, 2020   hydrOXYzine 25 MG tablet Commonly known as: ATARAX/VISTARIL TAKE 1 TABLET BY MOUTH AT BEDTIME AS NEEDED What changed: when to take this   lisinopril 10 MG tablet Commonly known as: ZESTRIL Take 1 tablet (10 mg total) by mouth daily. Start taking on: July 27, 2020   LORazepam 0.5 MG tablet Commonly known as:  ATIVAN Take 1 tablet (0.5 mg total) by mouth every 6 (six) hours as needed for anxiety. What changed:   when to take this  reasons to take this   metoprolol tartrate 25 MG tablet Commonly known as: LOPRESSOR Take 1 tablet (25 mg total) by mouth 2 (two) times daily.   multivitamin with minerals Tabs tablet Take 2 tablets by mouth daily. 50 +   potassium chloride SA 20 MEQ tablet Commonly known as: KLOR-CON Take 1 tablet (20 mEq total) by mouth daily for 3 days. Start taking on: July 27, 2020   rosuvastatin 10 MG tablet Commonly known as: CRESTOR Take 1 tablet (10 mg total) by mouth daily. Start taking on: July 27, 2020   sennosides-docusate sodium 8.6-50 MG tablet Commonly known as: SENOKOT-S Take 1 tablet by  mouth daily with supper.   sertraline 50 MG tablet Commonly known as: ZOLOFT TAKE 1 AND 1/2 TABLETS DAILY BY MOUTH What changed: See the new instructions.   traMADol 50 MG tablet Commonly known as: ULTRAM Take 1 tablet (50 mg total) by mouth every 6 (six) hours as needed for up to 7 days for moderate pain.   VITAMIN D PO Take 1 capsule by mouth daily.            Durable Medical Equipment  (From admission, onward)         Start     Ordered   07/26/20 1013  For home use only DME 3 n 1  Once        07/26/20 1017   07/26/20 1013  For home use only DME Walker rolling  Once       Question Answer Comment  Walker: With 5 Inch Wheels   Patient needs a walker to treat with the following condition Physical deconditioning      07/26/20 1017          Follow-up Information    [facility] Follow up on 07/26/2020.   Specialty: Cardiology Why: Please arrive 15 minutes early for your 10:30 am coumadin clinic appointment. You will have your INR checked at this visit and be given recommendations for how to take your coumadin going forward.  Contact information: [address] [place] [phone]        Deberah Pelton, NP Follow up on 08/07/2020.   Specialty: Cardiology Why: Please arrive 15 minutes early for your 2:15pm post-hospital cardiology appointment Contact information: [address] [place] [address] [place] [address] [phone]        [place] [facility] Follow up on 09/02/2020.   Specialty: Cardiology Why: Please arrive 15 minutes early for your 11:20am follow-up ultrasounds of your heart.  Contact information: [address] [place] [phone]             The patient has been discharged on:   1.Beta Blocker:  Yes [ y  ]                              No   [   ]                              If No, reason:  2.Ace Inhibitor/ARB: Yes [ y  ]                                     No  [    ]                                     If No, reason:  3.Statin:   Yes Blue.Reese   ]                  No  [   ]                  If No, reason:  4.Ecasa:  Yes  [   ]  No   [   ]                  If No, reason:allergy- on Plavix  Signed: John Giovanni PA-C 07/26/2020, 10:32 AM

## 2020-07-22 NOTE — Progress Notes (Signed)
Rehab Admissions Coordinator Note:  Patient was screened by Cleatrice Burke for appropriateness for an Inpatient Acute Rehab Consult per PT recs. .  At this time, we are recommending Inpatient Rehab consult if you would like pt considered for possible admit. Please advise.  Cleatrice Burke RN MSN 07/22/2020, 12:55 PM  I can be reached at [phone].

## 2020-07-22 NOTE — Addendum Note (Signed)
Addendum  created 07/22/20 0810 by Josephine Igo, CRNA   Order list changed

## 2020-07-22 NOTE — Progress Notes (Signed)
Upon removing pacing wires, atrial lead met resistance ultimately pulling apart from remainder of wires. Dr Roxy Manns paged with no answer. Jadene Pierini PA paged and came to bedside to manipulate wire and continued to feel resistance thinking it may have gotten hung internally when pulling. Dr Roxy Manns paged again with no answer. Issue addressed during PM rounds with Dr Orvan Seen who tried to manipulate the wire with no success; therefore cut the wire at the skin level. Vital signs remainder stable and no complaints from patient.

## 2020-07-22 NOTE — Discharge Instructions (Signed)
[facility] office [phone]  Surgical Aortic Valve Replacement, Care After This sheet gives you information about how to care for yourself after your procedure. Your health care provider may also give you more specific instructions. If you have problems or questions, contact your health care provider. What can I expect after the procedure? After the procedure, it is common to have pain around your incision area. Follow these instructions at home: Medicines  Take over-the-counter and prescription medicines only as told by your health care provider.  If you were prescribed an antibiotic medicine, take it as told by your health care provider. Do not stop taking the antibiotic even if you start to feel better.  If you have a mechanical prosthesis, you may be given a blood thinner called warfarin. Follow instructions carefully on how to take this medicine.  Ask your health care provider if the medicine prescribed to you: ? Requires you to avoid driving or using heavy machinery. ? Can cause constipation. You may need to take actions to prevent or treat constipation, such as:  Take over-the-counter or prescription medicines.  Eat foods that are high in fiber, such as beans, whole grains, and fresh fruits and vegetables.  Limit foods that are high in fat and processed sugars, such as fried or sweet foods. Eating and drinking      Limit how much caffeine you drink. Caffeine can affect your heart's rate and rhythm.  Do not drink alcohol if: ? Your health care provider tells you not to drink. ? You are pregnant, may be pregnant, or are planning to become pregnant.  Drink enough fluid to keep your urine pale yellow.  Eat a heart-healthy diet that includes fruits, vegetables, whole grains, low-fat dairy products, and lean proteins like poultry and eggs. Incision care   Follow instructions from your health care provider about how to take care of your incision. Make sure you: ? Wash your  hands with soap and water before and after you change your bandage (dressing). If soap and water are not available, use hand sanitizer. ? Change your dressing as told by your health care provider. ? Leave stitches (sutures), skin glue, or adhesive strips in place. These skin closures may need to stay in place for 2 weeks or longer. If adhesive strip edges start to loosen and curl up, you may trim the loose edges. Do not remove adhesive strips completely unless your health care provider tells you to do that.  Check your incision area every day for signs of infection. Check for: ? Redness, swelling, or increasing pain. ? Fluid or blood. ? Warmth. ? Pus or a bad smell. Activity  Return to your normal activities as told by your health care provider. Most patients will need to limit any lifting or strenuous activity for 4-6 weeks.  Avoid sitting for a long time without moving. Get up to take short walks every 1-2 hours.  Do exercises as told by your health care provider.  Do not lift anything that is heavier than 10 lb (4.5 kg), or the limit that you are told, until your health care provider says that it is safe.  Avoid pushing or pulling things with your arms until your health care provider approves. This includes pulling on handrails to help you climb stairs. Lifestyle  Do not use any products that contain nicotine or tobacco, such as cigarettes, e-cigarettes, and chewing tobacco. These can delay incision healing after surgery. If you need help quitting, ask your health care provider.  Resume sexual activity as told by your health care provider. If you have erectile dysfunction, do not use medicines to treat this condition unless your health care provider approves.  Work with your health care provider to: ? Keep your blood pressure and cholesterol under control. ? Manage any other heart conditions that you have. ? Maintain a healthy weight. Driving and travel  Do not drive until your  health care provider approves. Ask your health care provider when it is safe for you to drive.  Avoid airplane travel for as long as told by your health care provider.  When you travel, bring a list of your medicines and a record of your medical history with you. Carry your medicines with you. General instructions  Do not take baths, swim, or use a hot tub until your health care provider approves. You may shower.  Do not strain to have a bowel movement.  Avoid crossing your legs while sitting down.  Check your temperature every day for a fever. A fever may be a sign of infection.  If you are a woman and you plan to become pregnant, talk with your health care provider before you become pregnant.  Wear compression stockings as told by your health care provider. These stockings help to prevent blood clots and reduce swelling in your legs.  Tell all health care providers who care for you that you have an artificial (prosthetic) aortic valve. Also, tell them if you have or have had heart disease or endocarditis.  You will be given a card at discharge. The card indicates the type of prosthetic valve that you have. Keep this card in your wallet or purse for quick reference in case of an emergency.  Keep all follow-up visits as told by your health care provider. This is important. Contact a health care provider if:  You develop a skin rash.  You experience sudden, unexplained changes in your weight.  You have redness, swelling, or increasing pain around your incision.  You have fluid or blood coming from your incision.  Your incision feels warm to the touch.  You have pus or a bad smell coming from your incision.  You have a fever. Get help right away if you:  Develop chest pain that is different from the pain coming from your incision.  Develop shortness of breath or difficulty breathing.  Start to feel light-headed. These symptoms may represent a serious problem that is an  emergency. Do not wait to see if the symptoms will go away. Get medical help right away. Call your local emergency services (911 in the [place] Do not drive yourself to the hospital. Summary  After this procedure, it is common to have pain in the incision area.  Eat a heart-healthy diet. Follow instructions about alcohol use.  Get up to walk often. Avoid pushing and pulling with your arms. Ask what activities are safe for you.  Care for your incision as told by your health care provider. Check it daily for signs of infection.  Get help right away if you have chest pain that is different from your incisions, develop shortness of breath or difficulty breathing, or start to feel light-headed. This information is not intended to replace advice given to you by your health care provider. Make sure you discuss any questions you have with your health care provider. Document Revised: 08/25/2018 Document Reviewed: 08/25/2018 Elsevier Patient Education  2020 [place]. Endoscopic Saphenous Vein Harvesting, Care After This sheet gives you information about how  to care for yourself after your procedure. Your health care provider may also give you more specific instructions. If you have problems or questions, contact your health care provider. What can I expect after the procedure? After the procedure, it is common to have:  Pain.  Bruising.  Swelling.  Numbness. Follow these instructions at home: Incision care   Follow instructions from your health care provider about how to take care of your incisions. Make sure you: ? Wash your hands with soap and water before and after you change your bandages (dressings). If soap and water are not available, use hand sanitizer. ? Change your dressings as told by your health care provider. ? Leave stitches (sutures), skin glue, or adhesive strips in place. These skin closures may need to stay in place for 2 weeks or longer. If adhesive strip edges start to  loosen and curl up, you may trim the loose edges. Do not remove adhesive strips completely unless your health care provider tells you to do that.  Check your incision areas every day for signs of infection. Check for: ? More redness, swelling, or pain. ? Fluid or blood. ? Warmth. ? Pus or a bad smell. Medicines  Take over-the-counter and prescription medicines only as told by your health care provider.  Ask your health care provider if the medicine prescribed to you requires you to avoid driving or using heavy machinery. General instructions  Raise (elevate) your legs above the level of your heart while you are sitting or lying down.  Avoid crossing your legs.  Avoid sitting for long periods of time. Change positions every 30 minutes.  Do any exercises your health care providers have given you. These may include deep breathing, coughing, and walking exercises.  Do not take baths, swim, or use a hot tub until your health care provider approves. Ask your health care provider if you may take showers. You may only be allowed to take sponge baths.  Wear compression stockings as told by your health care provider. These stockings help to prevent blood clots and reduce swelling in your legs.  Keep all follow-up visits as told by your health care provider. This is important. Contact a health care provider if:  Medicine does not help your pain.  Your pain gets worse.  You have new leg bruises or your leg bruises get bigger.  Your leg feels numb.  You have more redness, swelling, or pain around your incision.  You have fluid or blood coming from your incision.  Your incision feels warm to the touch.  You have pus or a bad smell coming from your incision.  You have a fever. Get help right away if:  Your pain is severe.  You develop pain, tenderness, warmth, redness, or swelling in any part of your leg.  You have chest pain.  You have trouble breathing. Summary  Raise  (elevate) your legs above the level of your heart while you are sitting or lying down.  Wear compression stockings as told by your health care provider.  Make sure you know which symptoms should prompt you to contact your health care provider.  Keep all follow-up visits as told by your health care provider. This information is not intended to replace advice given to you by your health care provider. Make sure you discuss any questions you have with your health care provider. Document Revised: 11/07/2018 Document Reviewed: 11/07/2018 Elsevier Patient Education  [place].  Coronary Artery Bypass Grafting, Care After This sheet  gives you information about how to care for yourself after your procedure. Your doctor may also give you more specific instructions. If you have problems or questions, call your doctor. What can I expect after the procedure? After the procedure, it is common to:  Feel sick to your stomach (nauseous).  Not want to eat as much as normal (lack of appetite).  Have trouble pooping (constipation).  Have weakness and tiredness (fatigue).  Feel sad (depressed) or grouchy (irritable).  Have pain or discomfort around the cuts from surgery (incisions). Follow these instructions at home: Medicines  Take over-the-counter and prescription medicines only as told by your doctor. Do not stop taking medicines or start any new medicines unless your doctor says it is okay.  If you were prescribed an antibiotic medicine, take it as told by your doctor. Do not stop taking the antibiotic even if you start to feel better. Incision care   Follow instructions from your doctor about how to take care of your cuts from surgery. Make sure you: ? Wash your hands with soap and water before and after you change your bandage (dressing). If you cannot use soap and water, use hand sanitizer. ? Change your bandage as told by your doctor. ? Leave stitches (sutures), skin glue, or skin  tape (adhesive) strips in place. They may need to stay in place for 2 weeks or longer. If tape strips get loose and curl up, you may trim the loose edges. Do not remove tape strips completely unless your doctor says it is okay.  Make sure the surgery cuts are clean, dry, and protected.  Check your cut areas every day for signs of infection. Check for: ? More redness, swelling, or pain. ? More fluid or blood. ? Warmth. ? Pus or a bad smell.  If cuts were made in your legs: ? Avoid crossing your legs. ? Avoid sitting for long periods of time. Change positions every 30 minutes. ? Raise (elevate) your legs when you are sitting. Bathing  Do not take baths, swim, or use a hot tub until your doctor says it is okay.  You may shower.  Pat the surgery cuts dry. Do not rub the cuts to dry.  Eating and drinking   Eat foods that are high in fiber, such as beans, nuts, whole grains, and raw fruits and vegetables. Any meats you eat should be lean cut. Avoid canned, processed, and fried foods. This can help prevent trouble pooping. This is also a part of a heart-healthy diet.  Drink enough fluid to keep your pee (urine) pale yellow.  Do not drink alcohol until you are fully recovered. Ask your doctor when it is safe to drink alcohol. Activity  Rest and limit your activity as told by your doctor. You may be told to: ? Stop any activity right away if you have chest pain, shortness of breath, irregular heartbeats, or dizziness. Get help right away if you have any of these symptoms. ? Move around often for short periods or take short walks as told by your doctor. Slowly increase your activities. ? Avoid lifting, pushing, or pulling anything that is heavier than 10 lb (4.5 kg) for at least 6 weeks or as told by your doctor.  Do physical therapy or a cardiac rehab (cardiac rehabilitation) program as told by your doctor. ? Physical therapy involves doing exercises to maintain movement and build strength  and endurance. ? A cardiac rehab program includes:  Exercise training.  Education.  Counseling.  Do not drive until your doctor says it is okay.  Ask your doctor when you can go back to work.  Ask your doctor when you can be sexually active. General instructions  Do not drive or use heavy machinery while taking prescription pain medicine.  Do not use any products that contain nicotine or tobacco. These include cigarettes, e-cigarettes, and chewing tobacco. If you need help quitting, ask your doctor.  Take 2-3 deep breaths every few hours during the day while you get better. This helps expand your lungs and prevent problems.  If you were given a device called an incentive spirometer, use it several times a day to practice deep breathing. Support your chest with a pillow or your arms when you take deep breaths or cough.  Wear compression stockings as told by your doctor.  Weigh yourself every day. This helps to see if your body is holding (retaining) fluid that may make your heart and lungs work harder.  Keep all follow-up visits as told by your doctor. This is important. Contact a doctor if:  You have more redness, swelling, or pain around any cut.  You have more fluid or blood coming from any cut.  Any cut feels warm to the touch.  You have pus or a bad smell coming from any cut.  You have a fever.  You have swelling in your ankles or legs.  You have pain in your legs.  You gain 2 lb (0.9 kg) or more a day.  You feel sick to your stomach or you throw up (vomit).  You have watery poop (diarrhea). Get help right away if:  You have chest pain that goes to your jaw or arms.  You are short of breath.  You have a fast or irregular heartbeat.  You notice a "clicking" in your breastbone (sternum) when you move.  You have any signs of a stroke. "BE FAST" is an easy way to remember the main warning signs: ? B - Balance. Signs are dizziness, sudden trouble walking,  or loss of balance. ? E - Eyes. Signs are trouble seeing or a change in how you see. ? F - Face. Signs are sudden weakness or loss of feeling of the face, or the face or eyelid drooping on one side. ? A - Arms. Signs are weakness or loss of feeling in an arm. This happens suddenly and usually on one side of the body. ? S - Speech. Signs are sudden trouble speaking, slurred speech, or trouble understanding what people say. ? T - Time. Time to call emergency services. Write down what time symptoms started.  You have other signs of a stroke, such as: ? A sudden, very bad headache with no known cause. ? Feeling sick to your stomach. ? Throwing up. ? Jerky movements you cannot control (seizure). These symptoms may be an emergency. Do not wait to see if the symptoms will go away. Get medical help right away. Call your local emergency services (911 in the [place] Do not drive yourself to the hospital. Summary  After the procedure, it is common to have pain or discomfort in the cuts from surgery (incisions).  Do not take baths, swim, or use a hot tub until your doctor says it is okay.  Slowly increase your activities. You may need physical therapy or cardiac rehab.  Weigh yourself every day. This helps to see if your body is holding fluid. This information is not intended to replace advice given to you by  your health care provider. Make sure you discuss any questions you have with your health care provider. Document Revised: 08/09/2018 Document Reviewed: 08/09/2018 Elsevier Patient Education  2020 [employer].

## 2020-07-23 ENCOUNTER — Inpatient Hospital Stay (HOSPITAL_COMMUNITY): Payer: Medicare HMO

## 2020-07-23 LAB — BPAM RBC
Blood Product Expiration Date: 202109022359
Blood Product Expiration Date: 202109022359
Blood Product Expiration Date: 202109022359
Blood Product Expiration Date: 202109022359
Blood Product Expiration Date: 202109022359
Blood Product Expiration Date: 202109022359
Blood Product Expiration Date: 202109062359
Blood Product Expiration Date: 202109062359
Blood Product Expiration Date: 202109062359
Blood Product Expiration Date: 202109062359
Blood Product Expiration Date: 202109062359
ISSUE DATE / TIME: 202108061018
ISSUE DATE / TIME: 202108061018
ISSUE DATE / TIME: 202108061553
ISSUE DATE / TIME: 202108061553
ISSUE DATE / TIME: 202108061644
ISSUE DATE / TIME: 202108061722
ISSUE DATE / TIME: 202108061722
ISSUE DATE / TIME: 202108061722
ISSUE DATE / TIME: 202108061722
Unit Type and Rh: 6200
Unit Type and Rh: 6200
Unit Type and Rh: 6200
Unit Type and Rh: 6200
Unit Type and Rh: 6200
Unit Type and Rh: 6200
Unit Type and Rh: 6200
Unit Type and Rh: 6200
Unit Type and Rh: 6200
Unit Type and Rh: 6200
Unit Type and Rh: 6200

## 2020-07-23 LAB — BASIC METABOLIC PANEL
Anion gap: 13 (ref 5–15)
BUN: 25 mg/dL — ABNORMAL HIGH (ref 8–23)
CO2: 25 mmol/L (ref 22–32)
Calcium: 8.1 mg/dL — ABNORMAL LOW (ref 8.9–10.3)
Chloride: 95 mmol/L — ABNORMAL LOW (ref 98–111)
Creatinine, Ser: 1.08 mg/dL (ref 0.61–1.24)
GFR calc Af Amer: >60 mL/min (ref >60)
GFR calc non Af Amer: >60 mL/min (ref >60)
Glucose, Bld: 116 mg/dL — ABNORMAL HIGH (ref 70–99)
Potassium: 3.5 mmol/L (ref 3.5–5.1)
Sodium: 133 mmol/L — ABNORMAL LOW (ref 135–145)

## 2020-07-23 LAB — TYPE AND SCREEN
ABO/RH(D): A POS
Antibody Screen: NEGATIVE
Unit division: 0
Unit division: 0
Unit division: 0
Unit division: 0
Unit division: 0
Unit division: 0
Unit division: 0
Unit division: 0
Unit division: 0
Unit division: 0
Unit division: 0

## 2020-07-23 LAB — CBC
HCT: 35.1 % — ABNORMAL LOW (ref 39.0–52.0)
Hemoglobin: 11.9 g/dL — ABNORMAL LOW (ref 13.0–17.0)
MCH: 31.5 pg (ref 26.0–34.0)
MCHC: 33.9 g/dL (ref 30.0–36.0)
MCV: 92.9 fL (ref 80.0–100.0)
Platelets: 136 10*3/uL — ABNORMAL LOW (ref 150–400)
RBC: 3.78 MIL/uL — ABNORMAL LOW (ref 4.22–5.81)
RDW: 13.7 % (ref 11.5–15.5)
WBC: 14 10*3/uL — ABNORMAL HIGH (ref 4.0–10.5)
nRBC: 0 % (ref 0.0–0.2)

## 2020-07-23 MED ORDER — MAGIC MOUTHWASH
5.0000 mL | Freq: Three times a day (TID) | ORAL | Status: AC | PRN
  Filled 2020-07-23: qty 5

## 2020-07-23 MED ORDER — FUROSEMIDE 40 MG PO TABS
40.0000 mg | ORAL_TABLET | Freq: Two times a day (BID) | ORAL | Status: DC
  Administered 2020-07-23 – 2020-07-25 (×5): 40 mg via ORAL
  Filled 2020-07-23 (×5): qty 1

## 2020-07-23 MED ORDER — SODIUM BICARBONATE 8.4 % IV SOLN
INTRAVENOUS | Status: AC
  Filled 2020-07-23: qty 100

## 2020-07-23 MED ORDER — POTASSIUM CHLORIDE CRYS ER 20 MEQ PO TBCR
20.0000 meq | EXTENDED_RELEASE_TABLET | ORAL | Status: AC
  Administered 2020-07-23 (×3): 20 meq via ORAL
  Filled 2020-07-23 (×3): qty 1

## 2020-07-23 MED ORDER — POTASSIUM CHLORIDE CRYS ER 20 MEQ PO TBCR: 20.0000 meq | EXTENDED_RELEASE_TABLET | Freq: Every day | ORAL | Status: DC

## 2020-07-23 MED ORDER — FUROSEMIDE 40 MG PO TABS
40.0000 mg | ORAL_TABLET | Freq: Every day | ORAL | Status: DC
  Administered 2020-07-23: 40 mg via ORAL
  Filled 2020-07-23: qty 1

## 2020-07-23 MED FILL — Cefuroxime Sodium For Inj 750 MG: INTRAMUSCULAR | Qty: 750 | Status: AC

## 2020-07-23 MED FILL — Potassium Chloride Inj 2 mEq/ML: INTRAVENOUS | Qty: 40 | Status: AC

## 2020-07-23 MED FILL — Heparin Sodium (Porcine) Inj 1000 Unit/ML: INTRAMUSCULAR | Qty: 30 | Status: AC

## 2020-07-23 MED FILL — Mannitol IV Soln 20%: INTRAVENOUS | Qty: 500 | Status: AC

## 2020-07-23 MED FILL — Lidocaine HCl Local Preservative Free (PF) Inj 2%: INTRAMUSCULAR | Qty: 15 | Status: AC

## 2020-07-23 MED FILL — Sodium Chloride IV Soln 0.9%: INTRAVENOUS | Qty: 2000 | Status: AC

## 2020-07-23 MED FILL — Heparin Sodium (Porcine) Inj 1000 Unit/ML: INTRAMUSCULAR | Qty: 3000 | Status: AC

## 2020-07-23 MED FILL — Heparin Sodium (Porcine) Inj 1000 Unit/ML: INTRAMUSCULAR | Qty: 10 | Status: AC

## 2020-07-23 MED FILL — Sodium Chloride IV Soln 0.9%: INTRAVENOUS | Qty: 3000 | Status: AC

## 2020-07-23 MED FILL — Electrolyte-R (PH 7.4) Solution: INTRAVENOUS | Qty: 6000 | Status: AC

## 2020-07-23 MED FILL — Vancomycin HCl For IV Soln 1 GM (Base Equivalent): INTRAVENOUS | Qty: 1000 | Status: AC

## 2020-07-23 MED FILL — Sodium Bicarbonate IV Soln 8.4%: INTRAVENOUS | Qty: 50 | Status: AC

## 2020-07-23 NOTE — Progress Notes (Signed)
Pt received from [place]. VSS. CHG complete. Pt A/Ox4. Call light in reach.  Clyde Canterbury, RN

## 2020-07-23 NOTE — Progress Notes (Addendum)
[address]       [place], [address]             [phone]      4 Days Post-Op Procedure(s) (LRB): EXPLORATION POST OPERATIVE OPEN HEART (N/A) Subjective: Feels pretty well, has some c/o mouth sores under tongue  Objective: Vital signs in last 24 hours: Temp:  [97.7 F (36.5 C)-100 F (37.8 C)] 97.7 F (36.5 C) (08/10 0500) Pulse Rate:  [85-95] 85 (08/10 0525) Cardiac Rhythm: Normal sinus rhythm (08/10 0525) Resp:  [9-26] 9 (08/10 0525) BP: (96-144)/(64-86) 102/69 (08/10 0525) SpO2:  [93 %-96 %] 93 % (08/10 0500) Weight:  [65.8 kg] 65.8 kg (08/10 0500)  Hemodynamic parameters for last 24 hours:    Intake/Output from previous day: 08/09 0701 - 08/10 0700 In: 1280 [P.O.:1280] Out: 2080 [Urine:2050; Chest Tube:30] Intake/Output this shift: No intake/output data recorded.  General appearance: alert, cooperative and no distress Heart: regular rate and rhythm Lungs: clear to auscultation bilaterally Abdomen: benign Extremities: no edema Wound: incis healing well  Lab Results: Recent Labs    07/21/20 0338 07/22/20 0627  WBC 17.8* 15.5*  HGB 11.7* 11.7*  HCT 34.1* 34.5*  PLT 114* 109*   BMET:  Recent Labs    07/21/20 0338 07/22/20 0627  NA 139 134*  K 3.5 4.4  CL 103 101  CO2 26 25  GLUCOSE 100* 166*  BUN 21 23  CREATININE 0.99 1.00  CALCIUM 8.5* 8.4*    PT/INR: No results for input(s): LABPROT, INR in the last 72 hours. ABG    Component Value Date/Time   PHART 7.374 07/20/2020 0727   HCO3 27.7 07/20/2020 0727   TCO2 29 07/20/2020 0727   ACIDBASEDEF 1.0 07/20/2020 0605   O2SAT 98.0 07/20/2020 0727   CBG (last 3)  Recent Labs    07/22/20 0644 07/22/20 1134 07/22/20 1637  GLUCAP 150* 116* 135*    Meds Scheduled Meds: . acetaminophen  1,000 mg Oral Q6H  . bisacodyl  10 mg Oral Daily   Or  . bisacodyl  10 mg Rectal Daily  . chlorhexidine gluconate (MEDLINE KIT)  15 mL Mouth Rinse BID  . Chlorhexidine Gluconate  Cloth  6 each Topical Daily  . Chlorhexidine Gluconate Cloth  6 each Topical Daily  . clopidogrel  75 mg Oral Daily  . Deutetrabenazine  18 mg Oral BID  . docusate sodium  200 mg Oral Daily  . enoxaparin (LOVENOX) injection  30 mg Subcutaneous Q24H  . furosemide  20 mg Intravenous BID  . lisinopril  10 mg Oral Daily  . metoprolol tartrate  25 mg Oral BID  . pantoprazole  40 mg Oral Daily  . potassium chloride  20 mEq Oral Daily  . rosuvastatin  10 mg Oral Daily  . sertraline  75 mg Oral Daily  . sodium bicarbonate      . sodium chloride flush  10-40 mL Intracatheter Q12H  . sodium chloride flush  3 mL Intravenous Q12H   Continuous Infusions: . sodium chloride    . lactated ringers 10 mL/hr (07/19/20 1957)   PRN Meds:.haloperidol lactate, hydrOXYzine, labetalol, LORazepam, metoprolol tartrate, morphine injection, ondansetron (ZOFRAN) IV, oxyCODONE, sodium chloride flush, sodium chloride flush, traMADol  Xrays DG Chest Port 1 View  Result Date: 07/22/2020 CLINICAL DATA:  Open heart surgery.  CABG. EXAM: PORTABLE CHEST 1 VIEW COMPARISON:  07/21/2020 FINDINGS: Normal cardiac silhouette. Bilateral chest tubes in 2 mediastinal drains unchanged. Removal of IJ sheath. Trace  apical pneumothorax at the LEFT lung apex again noted. RIGHT apical pneumothorax not appreciated. Lungs are well inflated. Discoid atelectasis in the mid LEFT lung. Prosthetic heart valve noted. IMPRESSION: 1. Bilateral chest tubes in place with trace LEFT apical pneumothorax unchanged in volume. 2. Lungs are well expanded.  Mild atelectasis. Electronically Signed   By: Suzy Bouchard M.D.   On: 07/22/2020 08:05    Assessment/Plan: S/P Procedure(s) (LRB): EXPLORATION POST OPERATIVE OPEN HEART (N/A)   Doing well POD#4 1 VSS, BP control is better on lisinopril 2 sats are good on RA 3 Replace K+ for 3.5 4 leukocytosis improving, Tmax 100.4 5 renal fxn is stable, weight improving, will change to lasix 40 po daily for  now 6 expected ABL anemia- stable, improved 7 mental status appears to be baseline 8 hasn't walked much- push rehab as able 9 magic mouthwash for mouth sores 10 BS adeq control 11 poss tx to 4e  LOS: 4 days    John Giovanni PA-C Pager [phone] 07/23/2020   I have seen and examined the patient and agree with the assessment and plan as outlined.  Making good progress.  Ready for d/c to CIR service.  Transfer 4E  Rexene Alberts, MD 07/23/2020 12:02 PM

## 2020-07-23 NOTE — Evaluation (Addendum)
Occupational Therapy Evaluation Patient Details Name: Neil [name] MRN: [id number] DOB: Aug 05, 1941 Today's Date: 07/23/2020    History of Present Illness Pt is a 79 y.o. male with aortic stenosis and CAD, admitted 07/19/20 for CABG, AVR, TEE. Course complicated by postop bleeding with immediate return to OR for reexploration of bleeding 8/6. Extubated 8/7. Pt with some AMS and confusion. Other PMH includes anxiety, depression, schizophrenia.   Clinical Impression   This 79 y/o male presents with the above. PTA pt performing mobility tasks independently, receiving some assist from spouse for ADL completion. Pt very pleasant and willing to participate in therapy session. Pt requiring minA(+2) for functional transfers via HHA, tolerating OOB to recliner. He currently requires up to maxA for ADL tasks.  Reinforced/initiated education of sternal precautions throughout session with cues provided PRN. Pt to benefit from continued acute OT services and feel he is an excellent candidate for CIR level therapies at time of discharge to maximize his overall safety and independence with ADL and mobility. Will follow.   BP start of session 135/77 (95) End of session 136/83 (98) HR 93    Follow Up Recommendations  CIR    Equipment Recommendations  3 in 1 bedside commode;Other (comment) (TBD in next venue)           Precautions / Restrictions Precautions Precautions: Fall;Sternal Precaution Booklet Issued: No Precaution Comments: verbally reviewed during session       Mobility Bed Mobility Overal bed mobility: Needs Assistance Bed Mobility: Supine to Sit     Supine to sit: Mod assist;+2 for safety/equipment     General bed mobility comments: pt able to initiate moving LEs towards EOB, HOB already to upright position, assist for trunk elevation/support and to scoot hips towards EOB, pt hugging pillow throughout with cues for maintaining sternal precautions   Transfers Overall transfer  level: Needs assistance Equipment used: 2 person hand held assist Transfers: Sit to/from [employer] Transfers Sit to Stand: Min assist;+2 safety/equipment Stand pivot transfers: Min assist;+2 physical assistance;+2 safety/equipment       General transfer comment: via HHA, pt with improved ability to rise to standing with light boosting assist, VCs for safe hand placement, taking few steps to recliner with steadying assist. completed sit<>stand from EOB and again from recliner     Balance Overall balance assessment: Needs assistance Sitting-balance support: Feet supported Sitting balance-Leahy Scale: Fair     Standing balance support: Single extremity supported;Bilateral upper extremity supported Standing balance-Leahy Scale: Poor                             ADL either performed or assessed with clinical judgement   ADL Overall ADL's : Needs assistance/impaired Eating/Feeding: Set up;Sitting Eating/Feeding Details (indicate cue type and reason): setup with lunch tray end of session Grooming: Supervision/safety;Sitting   Upper Body Bathing: Minimal assistance;Sitting   Lower Body Bathing: Moderate assistance;Sit to/from stand   Upper Body Dressing : Minimal assistance;Sitting   Lower Body Dressing: Maximal assistance;Sit to/from stand   Toilet Transfer: Minimal assistance;+2 for physical assistance;+2 for safety/equipment;Stand-pivot Toilet Transfer Details (indicate cue type and reason): simulated via transfer to [place] and Hygiene: Maximal assistance;Sit to/from stand;Sitting/lateral lean;+2 for safety/equipment       Functional mobility during ADLs: Minimal assistance;+2 for physical assistance;+2 for safety/equipment [facility])       Vision         Perception     Praxis  Pertinent Vitals/Pain Pain Assessment: Faces Faces Pain Scale: Hurts a little bit Pain Location: Sternal incision Pain Descriptors /  Indicators: Discomfort Pain Intervention(s): Monitored during session     Hand Dominance Right   Extremity/Trunk Assessment Upper Extremity Assessment Upper Extremity Assessment: Generalized weakness   Lower Extremity Assessment Lower Extremity Assessment: Defer to PT evaluation       Communication Communication Communication: HOH   Cognition Arousal/Alertness: Awake/alert Behavior During Therapy: WFL for tasks assessed/performed Overall Cognitive Status: Impaired/Different from baseline Area of Impairment: Attention;Memory;Following commands;Safety/judgement;Awareness;Problem solving                   Current Attention Level: Sustained Memory: Decreased short-term memory;Decreased recall of precautions Following Commands: Follows one step commands with increased time;Follows one step commands inconsistently Safety/Judgement: Decreased awareness of safety;Decreased awareness of deficits Awareness: Intellectual Problem Solving: Slow processing;Decreased initiation;Difficulty sequencing;Requires verbal cues     General Comments  spouse present and supportive during session, both pt/pt's spouse agreeable to rehab     Exercises     Shoulder Instructions      Home Living Family/patient expects to be discharged to:: Private residence Living Arrangements: Spouse/significant other;Children Available Help at Discharge: Family;Available 24 hours/day Type of Home: House Home Access: Stairs to enter [employer] of Steps: 1 Entrance Stairs-Rails: Right Home Layout: Two level;Bed/bath upstairs Alternate Level Stairs-Number of Steps: Flight Alternate Level Stairs-Rails: Right Bathroom Shower/Tub: Teacher, early years/pre: Standard     Home Equipment: None          Prior Functioning/Environment Level of Independence: Needs assistance  Gait / Transfers Assistance Needed: reports no assist for gait  ADL's / Homemaking Assistance Needed: spouse  assisting with ADL tasks, pt was able to step into/out of tub without assist             OT Problem List: Decreased strength;Decreased range of motion;Decreased activity tolerance;Impaired balance (sitting and/or standing);Decreased cognition;Decreased safety awareness;Decreased knowledge of precautions;Decreased knowledge of use of DME or AE;Cardiopulmonary status limiting activity;Pain      OT Treatment/Interventions: Self-care/ADL training;Therapeutic exercise;Energy conservation;DME and/or AE instruction;Therapeutic activities;Patient/family education;Balance training    OT Goals(Current goals can be found in the care plan section) Acute Rehab OT Goals Patient Stated Goal: Hopeful for return home but willing to consider rehab OT Goal Formulation: With patient Time For Goal Achievement: 08/06/20 Potential to Achieve Goals: Good  OT Frequency: Min 2X/week   Barriers to D/C:            Co-evaluation              AM-PAC OT "6 Clicks" Daily Activity     Outcome Measure Help from another person eating meals?: A Little Help from another person taking care of personal grooming?: A Little Help from another person toileting, which includes using toliet, bedpan, or urinal?: A Lot Help from another person bathing (including washing, rinsing, drying)?: A Lot Help from another person to put on and taking off regular upper body clothing?: A Lot Help from another person to put on and taking off regular lower body clothing?: A Lot 6 Click Score: 14   End of Session Nurse Communication: Mobility status  Activity Tolerance: Patient tolerated treatment well Patient left: in chair;with call bell/phone within reach;with family/visitor present;with nursing/sitter in room (safety sitter present )  OT Visit Diagnosis: Unsteadiness on feet (R26.81);Muscle weakness (generalized) (M62.81);Other symptoms and signs involving cognitive function  Time: 5732-2025 OT Time Calculation  (min): 19 min Charges:  OT General Charges $OT Visit: 1 Visit OT Evaluation $OT Eval Moderate Complexity: [place], OT Acute Rehabilitation Services Pager [phone] Office [phone]   Raymondo Band 07/23/2020, 1:07 PM

## 2020-07-23 NOTE — Progress Notes (Signed)
Mobility Specialist - Progress Note   07/23/20 1704  Mobility  Activity Ambulated in hall  Level of Assistance Contact guard assist, steadying assist  Assistive Device Front wheel walker  Distance Ambulated (ft) 300 ft  Mobility Response Tolerated well  Mobility performed by Mobility specialist  $Mobility charge 1 Mobility    Pre-mobility: 94 HR, 116/67 BP, 98% SpO2 During mobility: 95 HR, 95% SpO2 Post-mobility: 96 HR, 136/76 BP, 100% SpO2  Pt required a close contact guard w/ use of gait belt while ambulating in order to correct sudden loss of balance. His wife says he has had issues w/ balancing at home as well.   Pricilla Handler Mobility Specialist Mobility Specialist Phone: [phone]

## 2020-07-23 NOTE — Telephone Encounter (Signed)
Patient is currently admitted

## 2020-07-24 ENCOUNTER — Encounter (HOSPITAL_COMMUNITY): Payer: Self-pay | Admitting: Thoracic Surgery (Cardiothoracic Vascular Surgery)

## 2020-07-24 LAB — BASIC METABOLIC PANEL
Anion gap: 12 (ref 5–15)
BUN: 20 mg/dL (ref 8–23)
CO2: 24 mmol/L (ref 22–32)
Calcium: 7.6 mg/dL — ABNORMAL LOW (ref 8.9–10.3)
Chloride: 96 mmol/L — ABNORMAL LOW (ref 98–111)
Creatinine, Ser: 0.74 mg/dL (ref 0.61–1.24)
GFR calc Af Amer: >60 mL/min (ref >60)
GFR calc non Af Amer: >60 mL/min (ref >60)
Glucose, Bld: 115 mg/dL — ABNORMAL HIGH (ref 70–99)
Potassium: 3.4 mmol/L — ABNORMAL LOW (ref 3.5–5.1)
Sodium: 132 mmol/L — ABNORMAL LOW (ref 135–145)

## 2020-07-24 LAB — POCT I-STAT 7, (LYTES, BLD GAS, ICA,H+H)
Acid-Base Excess: 5 mmol/L — ABNORMAL HIGH (ref 0.0–2.0)
Acid-Base Excess: 6 mmol/L — ABNORMAL HIGH (ref 0.0–2.0)
Acid-Base Excess: 4 mmol/L — ABNORMAL HIGH (ref 0.0–2.0)
Acid-Base Excess: 1 mmol/L (ref 0.0–2.0)
Bicarbonate: 29 mmol/L — ABNORMAL HIGH (ref 20.0–28.0)
Bicarbonate: 30 mmol/L — ABNORMAL HIGH (ref 20.0–28.0)
Bicarbonate: 28 mmol/L (ref 20.0–28.0)
Bicarbonate: 24.8 mmol/L (ref 20.0–28.0)
Calcium, Ion: 1.17 mmol/L (ref 1.15–1.40)
Calcium, Ion: 0.9 mmol/L — ABNORMAL LOW (ref 1.15–1.40)
Calcium, Ion: 0.86 mmol/L — CL (ref 1.15–1.40)
Calcium, Ion: 0.78 mmol/L — CL (ref 1.15–1.40)
HCT: 34 % — ABNORMAL LOW (ref 39.0–52.0)
HCT: 22 % — ABNORMAL LOW (ref 39.0–52.0)
HCT: 20 % — ABNORMAL LOW (ref 39.0–52.0)
HCT: 22 % — ABNORMAL LOW (ref 39.0–52.0)
Hemoglobin: 11.6 g/dL — ABNORMAL LOW (ref 13.0–17.0)
Hemoglobin: 7.5 g/dL — ABNORMAL LOW (ref 13.0–17.0)
Hemoglobin: 6.8 g/dL — CL (ref 13.0–17.0)
Hemoglobin: 7.5 g/dL — ABNORMAL LOW (ref 13.0–17.0)
O2 Saturation: 100 %
O2 Saturation: 100 %
O2 Saturation: 100 %
O2 Saturation: 100 %
Potassium: 3.9 mmol/L (ref 3.5–5.1)
Potassium: 4.4 mmol/L (ref 3.5–5.1)
Potassium: 4.3 mmol/L (ref 3.5–5.1)
Potassium: 3.7 mmol/L (ref 3.5–5.1)
Sodium: 133 mmol/L — ABNORMAL LOW (ref 135–145)
Sodium: 136 mmol/L (ref 135–145)
Sodium: 136 mmol/L (ref 135–145)
Sodium: 140 mmol/L (ref 135–145)
TCO2: 30 mmol/L (ref 22–32)
TCO2: 31 mmol/L (ref 22–32)
TCO2: 29 mmol/L (ref 22–32)
TCO2: 26 mmol/L (ref 22–32)
pCO2 arterial: 41.6 mmHg (ref 32.0–48.0)
pCO2 arterial: 40.9 mmHg (ref 32.0–48.0)
pCO2 arterial: 38.1 mmHg (ref 32.0–48.0)
pCO2 arterial: 35.5 mmHg (ref 32.0–48.0)
pH, Arterial: 7.451 — ABNORMAL HIGH (ref 7.350–7.450)
pH, Arterial: 7.473 — ABNORMAL HIGH (ref 7.350–7.450)
pH, Arterial: 7.474 — ABNORMAL HIGH (ref 7.350–7.450)
pH, Arterial: 7.452 — ABNORMAL HIGH (ref 7.350–7.450)
pO2, Arterial: 555 mmHg — ABNORMAL HIGH (ref 83.0–108.0)
pO2, Arterial: 364 mmHg — ABNORMAL HIGH (ref 83.0–108.0)
pO2, Arterial: 351 mmHg — ABNORMAL HIGH (ref 83.0–108.0)
pO2, Arterial: 259 mmHg — ABNORMAL HIGH (ref 83.0–108.0)

## 2020-07-24 LAB — POCT I-STAT, CHEM 8
BUN: 12 mg/dL (ref 8–23)
BUN: 10 mg/dL (ref 8–23)
BUN: 11 mg/dL (ref 8–23)
BUN: 10 mg/dL (ref 8–23)
BUN: 9 mg/dL (ref 8–23)
BUN: 9 mg/dL (ref 8–23)
BUN: 9 mg/dL (ref 8–23)
BUN: 9 mg/dL (ref 8–23)
BUN: 8 mg/dL (ref 8–23)
Calcium, Ion: 1.11 mmol/L — ABNORMAL LOW (ref 1.15–1.40)
Calcium, Ion: 0.9 mmol/L — ABNORMAL LOW (ref 1.15–1.40)
Calcium, Ion: 1.17 mmol/L (ref 1.15–1.40)
Calcium, Ion: 0.89 mmol/L — CL (ref 1.15–1.40)
Calcium, Ion: 0.9 mmol/L — ABNORMAL LOW (ref 1.15–1.40)
Calcium, Ion: 0.85 mmol/L — CL (ref 1.15–1.40)
Calcium, Ion: 0.87 mmol/L — CL (ref 1.15–1.40)
Calcium, Ion: 0.84 mmol/L — CL (ref 1.15–1.40)
Calcium, Ion: 0.81 mmol/L — CL (ref 1.15–1.40)
Chloride: 95 mmol/L — ABNORMAL LOW (ref 98–111)
Chloride: 92 mmol/L — ABNORMAL LOW (ref 98–111)
Chloride: 95 mmol/L — ABNORMAL LOW (ref 98–111)
Chloride: 94 mmol/L — ABNORMAL LOW (ref 98–111)
Chloride: 95 mmol/L — ABNORMAL LOW (ref 98–111)
Chloride: 94 mmol/L — ABNORMAL LOW (ref 98–111)
Chloride: 95 mmol/L — ABNORMAL LOW (ref 98–111)
Chloride: 95 mmol/L — ABNORMAL LOW (ref 98–111)
Chloride: 97 mmol/L — ABNORMAL LOW (ref 98–111)
Creatinine, Ser: 0.5 mg/dL — ABNORMAL LOW (ref 0.61–1.24)
Creatinine, Ser: 0.4 mg/dL — ABNORMAL LOW (ref 0.61–1.24)
Creatinine, Ser: 0.6 mg/dL — ABNORMAL LOW (ref 0.61–1.24)
Creatinine, Ser: 0.4 mg/dL — ABNORMAL LOW (ref 0.61–1.24)
Creatinine, Ser: 0.5 mg/dL — ABNORMAL LOW (ref 0.61–1.24)
Creatinine, Ser: 0.4 mg/dL — ABNORMAL LOW (ref 0.61–1.24)
Creatinine, Ser: 0.4 mg/dL — ABNORMAL LOW (ref 0.61–1.24)
Creatinine, Ser: 0.4 mg/dL — ABNORMAL LOW (ref 0.61–1.24)
Creatinine, Ser: 0.3 mg/dL — ABNORMAL LOW (ref 0.61–1.24)
Glucose, Bld: 96 mg/dL (ref 70–99)
Glucose, Bld: 67 mg/dL — ABNORMAL LOW (ref 70–99)
Glucose, Bld: 80 mg/dL (ref 70–99)
Glucose, Bld: 89 mg/dL (ref 70–99)
Glucose, Bld: 95 mg/dL (ref 70–99)
Glucose, Bld: 100 mg/dL — ABNORMAL HIGH (ref 70–99)
Glucose, Bld: 101 mg/dL — ABNORMAL HIGH (ref 70–99)
Glucose, Bld: 102 mg/dL — ABNORMAL HIGH (ref 70–99)
Glucose, Bld: 93 mg/dL (ref 70–99)
HCT: 37 % — ABNORMAL LOW (ref 39.0–52.0)
HCT: 26 % — ABNORMAL LOW (ref 39.0–52.0)
HCT: 35 % — ABNORMAL LOW (ref 39.0–52.0)
HCT: 20 % — ABNORMAL LOW (ref 39.0–52.0)
HCT: 20 % — ABNORMAL LOW (ref 39.0–52.0)
HCT: 22 % — ABNORMAL LOW (ref 39.0–52.0)
HCT: 24 % — ABNORMAL LOW (ref 39.0–52.0)
HCT: 20 % — ABNORMAL LOW (ref 39.0–52.0)
HCT: 22 % — ABNORMAL LOW (ref 39.0–52.0)
Hemoglobin: 12.6 g/dL — ABNORMAL LOW (ref 13.0–17.0)
Hemoglobin: 11.9 g/dL — ABNORMAL LOW (ref 13.0–17.0)
Hemoglobin: 8.8 g/dL — ABNORMAL LOW (ref 13.0–17.0)
Hemoglobin: 6.8 g/dL — CL (ref 13.0–17.0)
Hemoglobin: 6.8 g/dL — CL (ref 13.0–17.0)
Hemoglobin: 7.5 g/dL — ABNORMAL LOW (ref 13.0–17.0)
Hemoglobin: 8.2 g/dL — ABNORMAL LOW (ref 13.0–17.0)
Hemoglobin: 6.8 g/dL — CL (ref 13.0–17.0)
Hemoglobin: 7.5 g/dL — ABNORMAL LOW (ref 13.0–17.0)
Potassium: 3.9 mmol/L (ref 3.5–5.1)
Potassium: 3.9 mmol/L (ref 3.5–5.1)
Potassium: 4.4 mmol/L (ref 3.5–5.1)
Potassium: 3.9 mmol/L (ref 3.5–5.1)
Potassium: 4.3 mmol/L (ref 3.5–5.1)
Potassium: 4.2 mmol/L (ref 3.5–5.1)
Potassium: 4.3 mmol/L (ref 3.5–5.1)
Potassium: 4.3 mmol/L (ref 3.5–5.1)
Potassium: 3.6 mmol/L (ref 3.5–5.1)
Sodium: 134 mmol/L — ABNORMAL LOW (ref 135–145)
Sodium: 134 mmol/L — ABNORMAL LOW (ref 135–145)
Sodium: 136 mmol/L (ref 135–145)
Sodium: 134 mmol/L — ABNORMAL LOW (ref 135–145)
Sodium: 135 mmol/L (ref 135–145)
Sodium: 135 mmol/L (ref 135–145)
Sodium: 136 mmol/L (ref 135–145)
Sodium: 137 mmol/L (ref 135–145)
Sodium: 139 mmol/L (ref 135–145)
TCO2: 26 mmol/L (ref 22–32)
TCO2: 29 mmol/L (ref 22–32)
TCO2: 29 mmol/L (ref 22–32)
TCO2: 27 mmol/L (ref 22–32)
TCO2: 27 mmol/L (ref 22–32)
TCO2: 26 mmol/L (ref 22–32)
TCO2: 27 mmol/L (ref 22–32)
TCO2: 27 mmol/L (ref 22–32)
TCO2: 23 mmol/L (ref 22–32)

## 2020-07-24 MED ORDER — POTASSIUM CHLORIDE CRYS ER 20 MEQ PO TBCR
20.0000 meq | EXTENDED_RELEASE_TABLET | Freq: Two times a day (BID) | ORAL | Status: DC
  Administered 2020-07-25 (×2): 20 meq via ORAL
  Filled 2020-07-24 (×2): qty 1

## 2020-07-24 MED ORDER — POTASSIUM CHLORIDE CRYS ER 20 MEQ PO TBCR
40.0000 meq | EXTENDED_RELEASE_TABLET | Freq: Two times a day (BID) | ORAL | Status: AC
  Administered 2020-07-24 (×2): 40 meq via ORAL
  Filled 2020-07-24 (×2): qty 2

## 2020-07-24 NOTE — Progress Notes (Addendum)
[address]       [place] [address]             [phone]      5 Days Post-Op Procedure(s) (LRB): EXPLORATION POST OPERATIVE OPEN HEART (N/A) Subjective: conts to do well, ambulation improving  Objective: Vital signs in last 24 hours: Temp:  [97.9 F (36.6 C)-98.4 F (36.9 C)] 98.3 F (36.8 C) (08/11 0553) Pulse Rate:  [89-95] 90 (08/11 0553) Cardiac Rhythm: Normal sinus rhythm;Bundle branch block (08/10 1944) Resp:  [15-20] 18 (08/11 0553) BP: (110-127)/(67-91) 114/73 (08/11 0553) SpO2:  [94 %-97 %] 97 % (08/11 0553) Weight:  [64 kg] 64 kg (08/11 0500)  Hemodynamic parameters for last 24 hours:    Intake/Output from previous day: 08/10 0701 - 08/11 0700 In: 440 [P.O.:440] Out: 700 [Urine:700] Intake/Output this shift: No intake/output data recorded.  General appearance: alert, cooperative and no distress Heart: regular rate and rhythm and occas extrasystole Lungs: mildly dim in the bases Abdomen: benign Extremities: minor ankle edema Wound: incis healing well without signs of infection  Lab Results: Recent Labs    07/22/20 0627 07/23/20 0645  WBC 15.5* 14.0*  HGB 11.7* 11.9*  HCT 34.5* 35.1*  PLT 109* 136*   BMET:  Recent Labs    07/23/20 0645 07/24/20 0400  NA 133* 132*  K 3.5 3.4*  CL 95* 96*  CO2 25 24  GLUCOSE 116* 115*  BUN 25* 20  CREATININE 1.08 0.74  CALCIUM 8.1* 7.6*    PT/INR: No results for input(s): LABPROT, INR in the last 72 hours. ABG    Component Value Date/Time   PHART 7.374 07/20/2020 0727   HCO3 27.7 07/20/2020 0727   TCO2 29 07/20/2020 0727   ACIDBASEDEF 1.0 07/20/2020 0605   O2SAT 98.0 07/20/2020 0727   CBG (last 3)  Recent Labs    07/22/20 0644 07/22/20 1134 07/22/20 1637  GLUCAP 150* 116* 135*    Meds Scheduled Meds: . acetaminophen  1,000 mg Oral Q6H  . bisacodyl  10 mg Oral Daily   Or  . bisacodyl  10 mg Rectal Daily  . chlorhexidine gluconate (MEDLINE KIT)  15 mL Mouth  Rinse BID  . Chlorhexidine Gluconate Cloth  6 each Topical Daily  . Chlorhexidine Gluconate Cloth  6 each Topical Daily  . clopidogrel  75 mg Oral Daily  . Deutetrabenazine  18 mg Oral BID  . docusate sodium  200 mg Oral Daily  . enoxaparin (LOVENOX) injection  30 mg Subcutaneous Q24H  . furosemide  40 mg Oral BID  . lisinopril  10 mg Oral Daily  . metoprolol tartrate  25 mg Oral BID  . pantoprazole  40 mg Oral Daily  . [START ON 07/25/2020] potassium chloride  20 mEq Oral BID  . potassium chloride  40 mEq Oral BID WC  . rosuvastatin  10 mg Oral Daily  . sertraline  75 mg Oral Daily  . sodium chloride flush  10-40 mL Intracatheter Q12H  . sodium chloride flush  3 mL Intravenous Q12H   Continuous Infusions: . sodium chloride    . lactated ringers 10 mL/hr (07/19/20 1957)   PRN Meds:.hydrOXYzine, labetalol, LORazepam, magic mouthwash, metoprolol tartrate, morphine injection, ondansetron (ZOFRAN) IV, oxyCODONE, sodium chloride flush, sodium chloride flush, traMADol  Xrays DG Chest Port 1 View  Result Date: 07/23/2020 CLINICAL DATA:  Status post coronary artery bypass grafting. Status post chest tube and mediastinal drain removal EXAM: PORTABLE CHEST 1 VIEW COMPARISON:  July 22, 2020 FINDINGS: Chest tubes and mediastinal drain have been removed. Small left apical pneumothorax persists without change. No tension component. There is atelectatic change in the left mid lung and medial left base regions, stable. There is a small amount of probable pleural effusion in the right major fissure. Lungs elsewhere clear. Heart is borderline enlarged. Patient is status post aortic valve replacement and coronary artery bypass grafting. No adenopathy. There is aortic atherosclerosis. No bone lesions. IMPRESSION: Small persistent left apical pneumothorax without tension component. Areas of atelectatic change and mild loculated effusion. Lungs elsewhere clear. Stable cardiac prominence. Postoperative changes  noted. Aortic Atherosclerosis (ICD10-I70.0). Electronically Signed   By: Lowella Grip III M.D.   On: 07/23/2020 08:14    Assessment/Plan: S/P Procedure(s) (LRB): EXPLORATION POST OPERATIVE OPEN HEART (N/A)  Doing well POD#5  1 VSS, Afebrile BP well controlled, sinus with some PVC's, K+ being replaced (3.4) 2 sats ok on RA 3 normal renal fxn, weight slowly trending down- cont current level of diuresis for now 4 being evaluated for CIR, cont therapies 5 cont magic mouthwash   LOS: 5 days    John Giovanni  PA-C Pager [phone] 07/24/2020   I have seen and examined the patient and agree with the assessment and plan as outlined.  Doing well.  Ready for d/c to CIR service.  Rexene Alberts, MD 07/24/2020 8:33 AM

## 2020-07-24 NOTE — Progress Notes (Signed)
IP rehab admissions - I met with patient and his wife at the bedside.  I gave them rehab booklets and explained CIR program.  Wife is interested in [facility].  I have called insurance carrier and opened the case with [facility] requesting CIR.  I will update all once I hear back from insurance case manager.  Call me for questions.  [phone]

## 2020-07-24 NOTE — Progress Notes (Signed)
Pt ambulated earlier with PT and then just ambulated with MS. Will f/u tomorrow. Yves Dill CES, ACSM 2:11 PM 07/24/2020

## 2020-07-24 NOTE — Progress Notes (Signed)
Mobility Specialist - Progress Note   07/24/20 1407  Mobility  Activity Ambulated in hall;Transferred to/from [facility]  Level of Assistance Contact guard assist, steadying assist  Assistive Device Front wheel walker  Distance Ambulated (ft) 240 ft  Mobility Response Tolerated well  Mobility performed by Mobility specialist  $Mobility charge 1 Mobility    Pre-mobility: 94 HR, 94% SpO2 During mobility: 105 HR, 99% SpO2 Post-mobility: 98 HR, 98% SpO2  Pt lost balance when standing up from [facility], I was able to safely guide back to the [facility] seat. He otherwise did not need his balance corrected while ambulating, I provided contact guard w/ use of the gait belt.   Pricilla Handler Mobility Specialist Mobility Specialist Phone: [phone]

## 2020-07-24 NOTE — Progress Notes (Signed)
Physical Therapy Treatment Patient Details Name: Neil [name] MRN: [id number] DOB: 03-13-1941 Today's Date: 07/24/2020    History of Present Illness Pt is a 79 y.o. male with aortic stenosis and CAD, admitted 07/19/20 for CABG, AVR, TEE. Course complicated by postop bleeding with immediate return to OR for reexploration of bleeding 8/6. Extubated 8/7. Pt with some AMS and confusion. Other PMH includes anxiety, depression, schizophrenia.    PT Comments    Patient progressing with mobility needing less help for transfers supine to sit and sit to stand.  He seems well oriented and appropriate, but still will benefit from comprehensive inpatient rehab to progress mobility, safety and cognition.  PT to follow.    Follow Up Recommendations  CIR;Supervision/Assistance - 24 hour     Equipment Recommendations  Rolling walker with 5" wheels;3in1 (PT)    Recommendations for Other Services       Precautions / Restrictions Precautions Precautions: Fall;Sternal    Mobility  Bed Mobility Overal bed mobility: Needs Assistance   Rolling: Supervision Sidelying to sit: Min assist Supine to sit: Mod assist     General bed mobility comments: cues for precautions with sternal pillow for support, assist for trunk upright and to side for both legs into bed  Transfers Overall transfer level: Needs assistance   Transfers: Sit to/from Stand Sit to Stand: Min assist         General transfer comment: assist up to stand to RW with cues and balance/anterior weight shift assist  Ambulation/Gait Ambulation/Gait assistance: Min assist Gait Distance (Feet): 200 Feet Assistive device: Rolling walker (2 wheeled) Gait Pattern/deviations: Step-through pattern;Decreased stride length;Shuffle;Trunk flexed     General Gait Details: cues for forward gaze, stride length and foot clearance   Stairs Stairs: Yes Stairs assistance: Min assist;Mod assist Stair Management: Sideways;Step to pattern;One  rail Right Number of Stairs: 3 General stair comments: side stepping with two hands to one rail with cues for technique and assist for balance.safety, pt fatigued and needed seated rest on step prior to walking back to his room,   Wheelchair Mobility    Modified Rankin (Stroke Patients Only)       Balance Overall balance assessment: Needs assistance Sitting-balance support: Feet supported Sitting balance-Leahy Scale: Fair Sitting balance - Comments: once out all the way to EOB and feet on the floor, but when feet not fully supported needing UE support or falling to R   Standing balance support: Bilateral upper extremity supported Standing balance-Leahy Scale: Poor Standing balance comment: UE support for balance                            Cognition Arousal/Alertness: Awake/alert Behavior During Therapy: Flat affect Overall Cognitive Status: Impaired/Different from baseline Area of Impairment: Problem solving;Following commands;Safety/judgement                       Following Commands: Follows one step commands consistently;Follows multi-step commands consistently;Follows one step commands with increased time Safety/Judgement: Decreased awareness of deficits   Problem Solving: Slow processing;Decreased initiation;Requires verbal cues        Exercises      General Comments General comments (skin integrity, edema, etc.): able to recall room number and that plans are for rehab possibly later today, remembered visit from wife yesterday; fatigued on steps with HR 119, and BP end of session 103/80      Pertinent Vitals/Pain Pain Assessment: No/denies pain    Home  Living Family/patient expects to be discharged to:: Private residence Living Arrangements: Spouse/significant other                  Prior Function            PT Goals (current goals can now be found in the care plan section) Progress towards PT goals: Progressing toward goals     Frequency    Min 3X/week      PT Plan Current plan remains appropriate    Co-evaluation              AM-PAC PT "6 Clicks" Mobility   Outcome Measure  Help needed turning from your back to your side while in a flat bed without using bedrails?: A Little Help needed moving from lying on your back to sitting on the side of a flat bed without using bedrails?: A Little Help needed moving to and from a bed to a chair (including a wheelchair)?: A Little Help needed standing up from a chair using your arms (e.g., wheelchair or bedside chair)?: A Little Help needed to walk in hospital room?: A Little Help needed climbing 3-5 steps with a railing? : A Little 6 Click Score: 18    End of Session   Activity Tolerance: Patient limited by fatigue Patient left: in bed;with call bell/phone within reach   PT Visit Diagnosis: Other abnormalities of gait and mobility (R26.89);Muscle weakness (generalized) (M62.81)     Time: 5003-7048 PT Time Calculation (min) (ACUTE ONLY): 34 min  Charges:  $Gait Training: 23-37 mins                     Neil [name], PT Acute Rehabilitation Services GQBVQ:[phone] Office:[phone] 07/24/2020    Neil [name] 07/24/2020, 1:14 PM

## 2020-07-25 NOTE — Progress Notes (Signed)
CARDIAC REHAB PHASE I   PRE:  Rate/Rhythm: 91 SR  BP:  Supine:   Sitting: 109/74  Standing:    SaO2: 97%RA  MODE:  Ambulation: 430 ft   POST:  Rate/Rhythm: 99  BP:  Supine:   Sitting: 143/80  Standing:    SaO2: 96%RA 1033-1123 Pt walked 430 ft on RA with gait belt use, rolling walker and asst x1. Pt encouraged to use sternal precautions. Pt got faster and more unsteady as he approached recliner at end of walk. Encouraged pt to slow down and he had to be steadied. Wife in to say that CIR was not an objection due to cost and wanted to take pt home with her son assisting. Many questions re what is available. We discussed HHPT, rolling walker and possibly 3n1. Gait belt given and encouraged her to have US show her use with future walks with staff. Notified case manager to see pt and wife re discharge needs.  Pt would like to talk with PT also.    Graylon Good, RN BSN  07/25/2020 11:20 AM

## 2020-07-25 NOTE — TOC Initial Note (Signed)
Transition of Care (TOC) - Initial/Assessment Note  Marvetta Gibbons RN, BSN Transitions of Care Unit 4E- RN Case Manager See Treatment Team for direct phone #    Patient Details  Name: Neil [name] MRN: [id number] Date of Birth: 1941/02/01  Transition of Care [facility]) CM/SW Contact:    Dawayne Patricia, RN Phone Number: 07/25/2020, 2:39 PM  Clinical Narrative:                 Pt from home with wife s/p CABG/AVR- CIR recommended by PT/OT and order placed for CIR eval/screen- seen by [facility] with CIR on 8/11 for possible admission and insurance auth started. Today wife wanted to speak with CM regarding option of returning home with [facility]- she and patient's son and spoken and are considering taking pt. Home instead of going to [place] rehab due to copay cost for rehab.  Discussed with wife HH, list provided for choice- Per CMS guidelines from medicare.gov website with star ratings (copy placed in shadow chart), per wife she does not have a preference for [facility] agency as long as they are in network with insurance. Talked about [place] services and frequency of [place] visits. Also discussed DME needs- pt would need RW and 3n1, wife also thinking about lift chair- discussed how medicare assists with cost for lift chair. Answered all questions wife and pt had at this time regarding [place] vs rehab. They are not interested in STSNF.  Wife would like to speak with MD/PA regarding transition plan INPT rehab vs home with [facility].  If decision is made for Home with [facility] will need HH and DME orders placed. If they decide on CIR then will need to await and see if insurance approves (which may be until Monday before we hear something).  Call made to Evonnie Pat- regarding wife's request to discuss transition plan- he will f/u with Dr.Owen and have discussion with wife/pt.   Expected Discharge Plan: IP Rehab Facility Barriers to Discharge: Continued Medical Work up   Patient Goals and CMS Choice Patient states their goals for this  hospitalization and ongoing recovery are:: rehab CMS Medicare.gov Compare Post Acute Care list provided to:: Patient Choice offered to / list presented to : Patient, Spouse  Expected Discharge Plan and Services Expected Discharge Plan: [place]   Discharge Planning Services: CM Consult Post Acute Care Choice: Home Health, Durable Medical Equipment, IP Rehab Living arrangements for the past 2 months: Single Family Home                 DME Arranged: 3-N-1, Walker rolling DME Agency: [employer]                  Prior Living Arrangements/Services Living arrangements for the past 2 months: Single Family Home Lives with:: Spouse Patient language and need for interpreter reviewed:: Yes Do you feel safe going back to the place where you live?: Yes      Need for Family Participation in Patient Care: Yes (Comment) Care giver support system in place?: Yes (comment)   Criminal Activity/Legal Involvement Pertinent to Current Situation/Hospitalization: No - Comment as needed  Activities of Daily Living Home Assistive Devices/Equipment: Gilford Rile (specify type) ADL Screening (condition at time of admission) Patient's cognitive ability adequate to safely complete daily activities?: No Is the patient deaf or have difficulty hearing?: No Does the patient have difficulty seeing, even when wearing glasses/contacts?: No Does the patient have difficulty concentrating, remembering, or making decisions?: Yes Patient able to express need  for assistance with ADLs?: Yes Does the patient have difficulty dressing or bathing?: No Independently performs ADLs?: Yes (appropriate for developmental age) Communication: Independent Dressing (OT): Needs assistance Is this a change from baseline?: Pre-admission baseline Grooming: Needs assistance Is this a change from baseline?: Pre-admission baseline Feeding: Independent Bathing: Needs assistance Is this a change from baseline?: Pre-admission  baseline Toileting: Needs assistance Is this a change from baseline?: Pre-admission baseline In/Out Bed: Needs assistance Is this a change from baseline?: Pre-admission baseline Walks in Home: Needs assistance Is this a change from baseline?: Pre-admission baseline Does the patient have difficulty walking or climbing stairs?: Yes Weakness of Legs: Both Weakness of Arms/Hands: None  Permission Sought/Granted Permission sought to share information with : Chartered certified accountant granted to share information with : Yes, Verbal Permission Granted     Permission granted to share info w AGENCY: HH/DME agencies        Emotional Assessment Appearance:: Appears stated age Attitude/Demeanor/Rapport: Engaged Affect (typically observed): Appropriate Orientation: : Oriented to Place, Oriented to Self, Oriented to  Time, Oriented to Situation Alcohol / Substance Use: Not Applicable Psych Involvement: Outpatient Provider  Admission diagnosis:  S/P CABG x 4 [Z95.1] S/P aortic valve replacement with bioprosthetic valve [Z95.3] Patient Active Problem List   Diagnosis Date Noted  . 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  . CAD (coronary artery disease)   . Anxiety   . Coronary artery disease involving native coronary artery of native heart without angina pectoris   . Depression   . Dyslipidemia   . Severe aortic stenosis 06/26/2020   PCP:  Antony Contras, MD Pharmacy:   [id number] - [place], [place] [address] [place] [address] Phone: [phone] Fax: [place], [place] [address] [place] [address] Phone: [phone] Fax: [phone]     Social Determinants of Health (SDOH) Interventions    Readmission Risk Interventions No flowsheet data found.

## 2020-07-25 NOTE — Progress Notes (Signed)
Received call from [place] stating that pt has ST elevation, completed EKG and it read accelerated junctional. PA on call notified about pt switching from junctional rhythm to NSR. Pt VSS no complaints of distress. No new orders given, will continue to monitor.   Vianey Caniglia M

## 2020-07-25 NOTE — Telephone Encounter (Signed)
Current admit 8/12

## 2020-07-25 NOTE — Progress Notes (Signed)
Mobility Specialist - Progress Note   07/25/20 1606  Mobility  Activity Ambulated in hall  Level of Assistance Moderate assist, patient does 50-74%  Assistive Device Front wheel walker  Distance Ambulated (ft) 460 ft  Mobility Response Tolerated well  Mobility performed by Mobility specialist  $Mobility charge 1 Mobility    Pre-mobility: 95 HR, 95% SpO2 During mobility: 98 HR, 94%, SpO2 Post-mobility: 98 HR, 95% SpO2  Pt says he felt he ambulated better when walking at a slower pace. He did not require any steadying from myself, but did require mod assist w/ bed mobility and standing.   Pricilla Handler Mobility Specialist Mobility Specialist Phone: [phone]

## 2020-07-25 NOTE — Progress Notes (Addendum)
[address]       [place] [address]             [phone]      6 Days Post-Op Procedure(s) (LRB): EXPLORATION POST OPERATIVE OPEN HEART (N/A) Subjective: conts to feel well  Objective: Vital signs in last 24 hours: Temp:  [98 F (36.7 C)-98.4 F (36.9 C)] 98.4 F (36.9 C) (08/12 0500) Pulse Rate:  [88-96] 91 (08/12 0500) Cardiac Rhythm: Normal sinus rhythm (08/11 1900) Resp:  [16-23] 22 (08/12 0533) BP: (99-119)/(57-87) 117/66 (08/12 0500) SpO2:  [94 %-96 %] 95 % (08/12 0500) Weight:  [66.3 kg] 66.3 kg (08/12 0533)  Hemodynamic parameters for last 24 hours:    Intake/Output from previous day: 08/11 0701 - 08/12 0700 In: -  Out: 150 [Urine:150] Intake/Output this shift: No intake/output data recorded.  General appearance: alert, cooperative and no distress Heart: regular rate and rhythm Lungs: clear to auscultation bilaterally Abdomen: benign Extremities: + LE edema Wound: incis healing well  Lab Results: Recent Labs    07/23/20 0645  WBC 14.0*  HGB 11.9*  HCT 35.1*  PLT 136*   BMET:  Recent Labs    07/23/20 0645 07/24/20 0400  NA 133* 132*  K 3.5 3.4*  CL 95* 96*  CO2 25 24  GLUCOSE 116* 115*  BUN 25* 20  CREATININE 1.08 0.74  CALCIUM 8.1* 7.6*    PT/INR: No results for input(s): LABPROT, INR in the last 72 hours. ABG    Component Value Date/Time   PHART 7.374 07/20/2020 0727   HCO3 27.7 07/20/2020 0727   TCO2 29 07/20/2020 0727   ACIDBASEDEF 1.0 07/20/2020 0605   O2SAT 98.0 07/20/2020 0727   CBG (last 3)  Recent Labs    07/22/20 1134 07/22/20 1637  GLUCAP 116* 135*    Meds Scheduled Meds: . bisacodyl  10 mg Oral Daily   Or  . bisacodyl  10 mg Rectal Daily  . chlorhexidine gluconate (MEDLINE KIT)  15 mL Mouth Rinse BID  . Chlorhexidine Gluconate Cloth  6 each Topical Daily  . Chlorhexidine Gluconate Cloth  6 each Topical Daily  . clopidogrel  75 mg Oral Daily  . Deutetrabenazine  18 mg Oral BID  .  docusate sodium  200 mg Oral Daily  . enoxaparin (LOVENOX) injection  30 mg Subcutaneous Q24H  . furosemide  40 mg Oral BID  . lisinopril  10 mg Oral Daily  . metoprolol tartrate  25 mg Oral BID  . pantoprazole  40 mg Oral Daily  . potassium chloride  20 mEq Oral BID  . rosuvastatin  10 mg Oral Daily  . sertraline  75 mg Oral Daily  . sodium chloride flush  10-40 mL Intracatheter Q12H  . sodium chloride flush  3 mL Intravenous Q12H   Continuous Infusions: . sodium chloride    . lactated ringers 10 mL/hr (07/19/20 1957)   PRN Meds:.hydrOXYzine, labetalol, LORazepam, magic mouthwash, metoprolol tartrate, morphine injection, ondansetron (ZOFRAN) IV, sodium chloride flush, sodium chloride flush, traMADol  Xrays No results found.  Assessment/Plan: S/P Procedure(s) (LRB): EXPLORATION POST OPERATIVE OPEN HEART (N/A)  1 doing well POD#6  2  afeb, VSS, sinus with some PVC's 3 sats good on ra 4 no new labs 5 some volume overload persists, conts BID lasix for now 6 awaiting insurance approval for CIR   LOS: 6 days    John Giovanni PA-C Pager [phone] 07/25/2020   I have seen and  examined the patient and agree with the assessment and plan as outlined.  Rexene Alberts, MD 07/25/2020 7:55 AM

## 2020-07-26 MED ORDER — CLOPIDOGREL BISULFATE 75 MG PO TABS: 75.0000 mg | ORAL_TABLET | Freq: Every day | ORAL | 1 refills | Status: DC

## 2020-07-26 MED ORDER — METOPROLOL TARTRATE 25 MG PO TABS: 25.0000 mg | ORAL_TABLET | Freq: Two times a day (BID) | ORAL | 1 refills | Status: DC

## 2020-07-26 MED ORDER — ENSURE ENLIVE PO LIQD
237.0000 mL | Freq: Three times a day (TID) | ORAL | Status: DC
  Administered 2020-07-26: 237 mL via ORAL

## 2020-07-26 MED ORDER — FUROSEMIDE 40 MG PO TABS
40.0000 mg | ORAL_TABLET | Freq: Every day | ORAL | Status: DC
  Administered 2020-07-26: 40 mg via ORAL
  Filled 2020-07-26: qty 1

## 2020-07-26 MED ORDER — POTASSIUM CHLORIDE CRYS ER 20 MEQ PO TBCR: 20.0000 meq | EXTENDED_RELEASE_TABLET | Freq: Every day | ORAL | 0 refills | Status: DC

## 2020-07-26 MED ORDER — POTASSIUM CHLORIDE CRYS ER 20 MEQ PO TBCR
20.0000 meq | EXTENDED_RELEASE_TABLET | Freq: Every day | ORAL | Status: DC
  Administered 2020-07-26: 20 meq via ORAL
  Filled 2020-07-26: qty 1

## 2020-07-26 MED ORDER — FUROSEMIDE 40 MG PO TABS: 40.0000 mg | ORAL_TABLET | Freq: Every day | ORAL | 0 refills | Status: DC

## 2020-07-26 MED ORDER — ROSUVASTATIN CALCIUM 10 MG PO TABS: 10.0000 mg | ORAL_TABLET | Freq: Every day | ORAL | 1 refills | Status: DC

## 2020-07-26 MED ORDER — TRAMADOL HCL 50 MG PO TABS: 50.0000 mg | ORAL_TABLET | Freq: Four times a day (QID) | ORAL | 0 refills | Status: AC | PRN

## 2020-07-26 MED ORDER — LORAZEPAM 0.5 MG PO TABS: 0.5000 mg | ORAL_TABLET | Freq: Four times a day (QID) | ORAL | 1 refills | Status: DC | PRN

## 2020-07-26 MED ORDER — LISINOPRIL 10 MG PO TABS: 10.0000 mg | ORAL_TABLET | Freq: Every day | ORAL | 1 refills | Status: DC

## 2020-07-26 NOTE — Progress Notes (Signed)
Physical Therapy Treatment Patient Details Name: Neil [name] MRN: [id number] DOB: 13-Jan-1941 Today's Date: 07/26/2020    History of Present Illness Pt is a 79 y.o. male with aortic stenosis and CAD, admitted 07/19/20 for CABG, AVR, TEE. Course complicated by postop bleeding with immediate return to OR for reexploration of bleeding 8/6. Extubated 8/7. Pt with some AMS and confusion. Other PMH includes anxiety, depression, schizophrenia.    PT Comments    Pt was seen for mobility on RW, and reviewed with pt and wife his sequences of transfers and bed mob with sternal precautions.  Reminded wife that home therapy will help review his safety, and messaged the case manager to let her know about wife's questions about services and equipment for home.   Follow Up Recommendations  Home health PT;Supervision/Assistance - 24 hour;Supervision for mobility/OOB     Equipment Recommendations  Rolling walker with 5" wheels;3in1 (PT)    Recommendations for Other Services       Precautions / Restrictions Precautions Precautions: Fall;Sternal Precaution Booklet Issued: No Precaution Comments: verbally reviewed during session  Required Braces or Orthoses: Other Brace (heart pillow) Restrictions Weight Bearing Restrictions: Yes    Mobility  Bed Mobility Overal bed mobility: Needs Assistance Bed Mobility: Sidelying to Sit;Sit to Sidelying Rolling: Min guard Sidelying to sit: Min assist     Sit to sidelying: Min assist    Transfers Overall transfer level: Needs assistance Equipment used: Rolling walker (2 wheeled);1 person hand held assist Transfers: Sit to/from Stand Sit to Stand: Min guard Stand pivot transfers: Min guard          Ambulation/Gait Ambulation/Gait assistance: Min guard Gait Distance (Feet): 200 Feet Assistive device: Rolling walker (2 wheeled) Gait Pattern/deviations: Step-through pattern;Wide base of support;Decreased stride length Gait velocity:  Decreased Gait velocity interpretation: <1.31 ft/sec, indicative of household ambulator General Gait Details: pt has parkinson's like gait with shorter steps and stuttering steps esp with turns   Chief Strategy Officer    Modified Rankin (Stroke Patients Only)       Balance Overall balance assessment: Needs assistance Sitting-balance support: Feet supported Sitting balance-Leahy Scale: Fair   Postural control: Posterior lean Standing balance support: Bilateral upper extremity supported;During functional activity Standing balance-Leahy Scale: Poor                              Cognition Arousal/Alertness: Awake/alert Behavior During Therapy: Flat affect Overall Cognitive Status: Impaired/Different from baseline Area of Impairment: Problem solving;Awareness;Safety/judgement;Following commands;Memory;Attention;Orientation                 Orientation Level: Time Current Attention Level: Sustained Memory: Decreased recall of precautions Following Commands: Follows one step commands inconsistently;Follows one step commands with increased time Safety/Judgement: Decreased awareness of deficits;Decreased awareness of safety Awareness: Intellectual Problem Solving: Slow processing;Requires verbal cues;Requires tactile cues;Decreased initiation;Difficulty sequencing        Exercises      General Comments General comments (skin integrity, edema, etc.): Pt is demonstrating Parkinson's like gait changes, but is not diagnosed.  Has difficulty setting up to sit and turn, cued and repetitively trained his sequences with wife, messaged case management      Pertinent Vitals/Pain Pain Assessment: No/denies pain    Home Living                      Prior Function  PT Goals (current goals can now be found in the care plan section) Acute Rehab PT Goals Patient Stated Goal: wanting to get home Progress towards PT goals:  Progressing toward goals    Frequency    Min 3X/week      PT Plan Current plan remains appropriate    Co-evaluation              AM-PAC PT "6 Clicks" Mobility   Outcome Measure  Help needed turning from your back to your side while in a flat bed without using bedrails?: A Little Help needed moving from lying on your back to sitting on the side of a flat bed without using bedrails?: A Little Help needed moving to and from a bed to a chair (including a wheelchair)?: A Little Help needed standing up from a chair using your arms (e.g., wheelchair or bedside chair)?: A Little Help needed to walk in hospital room?: A Little Help needed climbing 3-5 steps with a railing? : Total 6 Click Score: 16    End of Session Equipment Utilized During Treatment: Gait belt Activity Tolerance: Patient limited by fatigue Patient left: in bed;with call bell/phone within reach;with bed alarm set;with family/visitor present Nurse Communication: Mobility status PT Visit Diagnosis: Other abnormalities of gait and mobility (R26.89);Muscle weakness (generalized) (M62.81)     Time: 5681-2751 PT Time Calculation (min) (ACUTE ONLY): 26 min  Charges:  $Gait Training: 8-22 mins $Therapeutic Activity: 8-22 mins                    Ramond Dial 07/26/2020, 11:29 AM  Mee Hives, PT MS Acute Rehab Dept. Number: [place] and [place]

## 2020-07-26 NOTE — Progress Notes (Signed)
Inpatient Rehab Admissions Coordinator:   Notified by CM, Marvetta Gibbons, that pt elected to discharge home.  Will sign off for CIR.    Shann Medal, PT, DPT Admissions Coordinator [phone] 07/26/20  1:17 PM

## 2020-07-26 NOTE — Telephone Encounter (Signed)
Patient currently admitted

## 2020-07-26 NOTE — TOC Transition Note (Signed)
Transition of Care (TOC) - CM/SW Discharge Note Marvetta Gibbons RN, BSN Transitions of Care Unit 4E- RN Case Manager See Treatment Team for direct phone #    Patient Details  Name: Neil [name] MRN: [id number] Date of Birth: 03-15-1941  Transition of Care [facility]) CM/SW Contact:  Dawayne Patricia, RN Phone Number: 07/26/2020, 1:03 PM   Clinical Narrative:    Pt stable for transition home, decision has been made to not do INPT rehab and to return home with [facility] services. Orders placed for HHPT/OT/aide and DME- RW - 3n1. As per conversation with wife yesterday- no preference for [facility] agency- call made to [facility] with [facility]- referral has been accepted.  Call made to [facility] with [employer] for DME needs- RW and 3n1 to be delivered to room prior to discharge.   Have updated wife and patient at bedside regarding [place] arrangements. Once DME delivered pt will be ready for discharge. Bedside RN aware. Notified AC with CIR that pt had elected to return home.   Final next level of care: [place] Barriers to Discharge: Barriers Resolved   Patient Goals and CMS Choice Patient states their goals for this hospitalization and ongoing recovery are:: will return home with [facility] CMS Medicare.gov Compare Post Acute Care list provided to:: Patient Choice offered to / list presented to : Patient, Spouse  Discharge Placement                       Discharge Plan and Services   Discharge Planning Services: CM Consult Post Acute Care Choice: Home Health, Durable Medical Equipment, IP Rehab          DME Arranged: 3-N-1, Walker rolling DME Agency: [employer] Date DME Agency Contacted: 07/26/20 Time DME Agency Contacted: 1950 Representative spoke with at DME Agency: Thedore Mins HH Arranged: PT, OT, Nurse's Aide [place] Agency: [facility] Date [place]: 07/26/20 Time [place]: 1215 Representative spoke with at [place]: [place] ([place])  Interventions     Readmission Risk Interventions Readmission Risk Prevention Plan 07/26/2020  Post Dischage Appt Complete  Medication Screening Complete  Transportation Screening Complete  Some recent data might be hidden

## 2020-07-26 NOTE — Progress Notes (Signed)
[id number] Education completed with pt and wife who voiced understanding. Reviewed staying in the tube and sternal precautions. Gave heart healthy diet and encouraged to watch sodium. Encouraged IS and had pt practice so wife could see correct use. Wrote down walking instructions-- pt to walk with walker and assistance as tolerated. Gave brochure for [place] CRP 2 in case interested in future. Just want to concentrate on walking at home for exercise now. Discussed wound care also. Graylon Good RN BSN 07/26/2020 12:02 PM '

## 2020-07-26 NOTE — Progress Notes (Addendum)
[address]       ,[place] [address]             [phone]      7 Days Post-Op Procedure(s) (LRB): EXPLORATION POST OPERATIVE OPEN HEART (N/A) Subjective: Says he was wheezing last night, minor SOB  Objective: Vital signs in last 24 hours: Temp:  [97.8 F (36.6 C)-98.5 F (36.9 C)] 98 F (36.7 C) (08/13 0355) Pulse Rate:  [80-95] 80 (08/13 0355) Cardiac Rhythm: Normal sinus rhythm;Bundle branch block;Heart block (08/12 1900) Resp:  [12-20] 19 (08/13 0439) BP: (107-128)/(63-90) 122/63 (08/13 0355) SpO2:  [94 %-97 %] 95 % (08/13 0355) Weight:  [64.6 kg] 64.6 kg (08/13 0439)  Hemodynamic parameters for last 24 hours:    Intake/Output from previous day: 08/12 0701 - 08/13 0700 In: 480 [P.O.:480] Out: 925 [Urine:925] Intake/Output this shift: No intake/output data recorded.  General appearance: alert, cooperative and no distress Heart: regular rate and rhythm Lungs: clear to auscultation bilaterally and doesnt't take deep breaths Abdomen: benign Extremities: minor edema Wound: incis healing well  Lab Results: No results for input(s): WBC, HGB, HCT, PLT in the last 72 hours. BMET:  Recent Labs    07/24/20 0400  NA 132*  K 3.4*  CL 96*  CO2 24  GLUCOSE 115*  BUN 20  CREATININE 0.74  CALCIUM 7.6*    PT/INR: No results for input(s): LABPROT, INR in the last 72 hours. ABG    Component Value Date/Time   PHART 7.374 07/20/2020 0727   HCO3 27.7 07/20/2020 0727   TCO2 29 07/20/2020 0727   ACIDBASEDEF 1.0 07/20/2020 0605   O2SAT 98.0 07/20/2020 0727   CBG (last 3)  No results for input(s): GLUCAP in the last 72 hours.  Meds Scheduled Meds: . bisacodyl  10 mg Oral Daily   Or  . bisacodyl  10 mg Rectal Daily  . chlorhexidine gluconate (MEDLINE KIT)  15 mL Mouth Rinse BID  . Chlorhexidine Gluconate Cloth  6 each Topical Daily  . Chlorhexidine Gluconate Cloth  6 each Topical Daily  . clopidogrel  75 mg Oral Daily  .  Deutetrabenazine  18 mg Oral BID  . docusate sodium  200 mg Oral Daily  . enoxaparin (LOVENOX) injection  30 mg Subcutaneous Q24H  . feeding supplement (ENSURE ENLIVE)  237 mL Oral TID WC  . furosemide  40 mg Oral BID  . lisinopril  10 mg Oral Daily  . metoprolol tartrate  25 mg Oral BID  . pantoprazole  40 mg Oral Daily  . potassium chloride  20 mEq Oral BID  . rosuvastatin  10 mg Oral Daily  . sertraline  75 mg Oral Daily  . sodium chloride flush  10-40 mL Intracatheter Q12H  . sodium chloride flush  3 mL Intravenous Q12H   Continuous Infusions: . sodium chloride    . lactated ringers 10 mL/hr (07/19/20 1957)   PRN Meds:.hydrOXYzine, labetalol, LORazepam, magic mouthwash, metoprolol tartrate, morphine injection, ondansetron (ZOFRAN) IV, sodium chloride flush, sodium chloride flush, traMADol  Xrays No results found.  Assessment/Plan: S/P Procedure(s) (LRB): EXPLORATION POST OPERATIVE OPEN HEART (N/A)  1 conts to do well 2 not wheezing currently 3 afeb, VSS, sats good on RA 4 sinus rhythm and junct rhythm, controlled rate mostly in 90's 5 we recommend short term CIR if family agrees, o/w home with HH/DME 6 reduce lasix to daily  LOS: 7 days    John Giovanni PA-C Pager [phone] 07/26/2020  I have seen and examined the patient and agree with the assessment and plan as outlined.  Ready for d/c once appropriate home health arrangements in place.  Approaching euvolemia - decrease lasix and stop it soon.  Encourage oral intake and nutritional support.  Rexene Alberts, MD 07/26/2020 8:50 AM

## 2020-07-26 NOTE — Progress Notes (Signed)
Discharge instructions provided to patient and wife. All medications, follow up appointments, and discharge instructions provided. IV out. Monitor off CCMD notified. Discharging home with wife  Ebany Bowermaster R Aida Lemaire, RN 

## 2020-07-26 NOTE — Care Management Important Message (Signed)
Important Message  Patient Details  Name: Neil [name] MRN: [id number] Date of Birth: Sep 26, 1941   Medicare Important Message Given:  Yes     Shelda Altes 07/26/2020, 10:53 AM

## 2020-07-29 ENCOUNTER — Telehealth: Payer: Self-pay | Admitting: Psychiatry

## 2020-07-29 ENCOUNTER — Other Ambulatory Visit: Payer: Self-pay | Admitting: Psychiatry

## 2020-07-29 NOTE — Telephone Encounter (Signed)
[place] called today about Neil [name].  He needs to have a grant to cover this medication.  All grants are frozen at this time and have been for a least the last 3 weeks.  Sharyn Lull was wanting to know if we would be able to sample the medication for Mr. [name].  She and I have both talked with [facility], the [name] rep.  He is working with his Freight forwarder to see if they will be able to help with the situation.  He will get back hopefully tomorrow.

## 2020-07-29 NOTE — Telephone Encounter (Signed)
Patient contacted regarding discharge from [facility] on 07/19/20.  Patient understands to follow up with provider Coletta Memos, NP on 08/07/20 at 2:15  at [facility]. Patient understands discharge instructions? Yes Patient understands medications and regiment? Yes Patient understands to bring all medications to this visit? Yes    Postop Surgical Patients:                What is your wound status? Any signs/ symptoms of infection (Temp, redness/ red streaks, swelling, purulent drainage, foul odor or smell)?  -Per the patients wife his incision looks great, denies oozing or other symptoms. Wife does report the patient feel slightly clammy but his temperature has been 99. Patients wife does report that the patient does have a couple red marks above incision but denies itching or burning. Advised patient's wife to continue to monitor it and let us know if it worsens or new symptoms develop.  -Asked patients wife about patients blood pressure, she has not checked that recently but stated she would begin checking and monitoring BP at home and will notify us of any high or low readings.   .             Please do not place any creams/ lotions/ or antibiotic ointment on any surgical incisions/ wounds without physician approval.  - patient and wife aware and verbalized understanding  .             Do you have any questions about your medications?  All medications (except pain medications) are to be filled by your Cardiologist AFTER your first post op  appointment with them.  Are you taking your pain medication?  - Patients wife states there are No Questions regarding medications .             How is your pain controlled? Pain level?  -Patients wife states that the patient has Had no complaints of pain at all since leaving the hospital.    .             If you require a refill on pain medications, know that the same medication/ amount may not be prescribed or a refill may not be given.  Please  contact your pharmacy for refill requests.   - Patient and patients wife made aware and verbalized understanding   .             Do you have help at home with ADL's?  If you have home health, have you been contacted or seen by the agency?  - Patients wife reports that herself and her son are helping the patient and that physical therapy is coming to see them at their home.  .             Please refer to your Pre/post surgery booklet, there is a lot of useful information in it that may answer any questions you may have.  -Patient's wife will look to see if she has it   .             Please note that it is ok to remove your surgical dressing, shower (soap/ water), and pat the incision dry.  -Per patients wife incision is clean, dry and open to air   [facility] [employer] [place],  [id number]

## 2020-07-29 NOTE — Telephone Encounter (Signed)
I am fine to support him with samples for awhile if the rep can get them for [place].  We cannot supply him with samples if he stays on 2 of the 9 mg tablets twice daily.  We may have to cut him back to 12 mg twice daily for now

## 2020-07-30 ENCOUNTER — Telehealth: Payer: Self-pay

## 2020-07-30 MED ORDER — POTASSIUM CHLORIDE CRYS ER 20 MEQ PO TBCR
20.0000 meq | EXTENDED_RELEASE_TABLET | Freq: Every day | ORAL | 0 refills | Status: DC
Start: 2020-07-30 — End: 2020-08-05

## 2020-07-30 MED ORDER — FUROSEMIDE 40 MG PO TABS
40.0000 mg | ORAL_TABLET | Freq: Every day | ORAL | 0 refills | Status: DC
Start: 2020-07-30 — End: 2020-08-05

## 2020-07-30 MED ORDER — NYSTATIN 100000 UNIT/ML MT SUSP
5.0000 mL | Freq: Three times a day (TID) | OROMUCOSAL | 0 refills | Status: AC
Start: 2020-07-30 — End: 2020-08-09

## 2020-07-30 NOTE — Telephone Encounter (Signed)
Pt's wife calls to report swelling to bilateral feet s/p CABG by Dr. Roxy Manns on 07/19/20. She states pt completed his 3-day regimen of Lasix yesterday following hospital d/c on 07/26/20. Pt has been elevating legs, but this has not seemed to help. Pt denies sob. Wife unable to send a photo, but states there is pitting edema noted "on the sides of both feet." Pt also c/o pain/irritation to his mouth and throat. Wife reports that he had this complaint while in the hospital and was given several doses of Magic Mouthwash which helped alleviate symptoms. Pt is now requesting Rx for this. TC to M. Roddenberry, PA to inform of the above information; new orders given for Lasix 40 mg daily x 10 days, Potassium 20 mEq daily x 10 days, and Nystatin oral suspension 5 ml swish & swallow tid x 10 days. These Rx's were submitted to [facility] and pt's wife was notified.

## 2020-08-02 ENCOUNTER — Other Ambulatory Visit: Payer: Self-pay | Admitting: Thoracic Surgery (Cardiothoracic Vascular Surgery)

## 2020-08-02 DIAGNOSIS — Z951 Presence of aortocoronary bypass graft: Secondary | ICD-10-CM

## 2020-08-05 ENCOUNTER — Encounter: Payer: Self-pay | Admitting: Physician Assistant

## 2020-08-05 ENCOUNTER — Ambulatory Visit
Admission: RE | Admit: 2020-08-05 | Discharge: 2020-08-05 | Disposition: A | Discharge status: Home / Self Care | Payer: Medicare HMO | Source: Ambulatory Visit | Attending: Thoracic Surgery (Cardiothoracic Vascular Surgery) | Admitting: Thoracic Surgery (Cardiothoracic Vascular Surgery)

## 2020-08-05 ENCOUNTER — Other Ambulatory Visit: Payer: Self-pay | Admitting: *Deleted

## 2020-08-05 ENCOUNTER — Ambulatory Visit (INDEPENDENT_AMBULATORY_CARE_PROVIDER_SITE_OTHER): Payer: Self-pay | Admitting: Physician Assistant

## 2020-08-05 ENCOUNTER — Other Ambulatory Visit: Payer: Self-pay

## 2020-08-05 VITALS — BP 145/80 | HR 100 | Temp 97.8°F | Resp 20 | Ht 65.0 in

## 2020-08-05 DIAGNOSIS — Z951 Presence of aortocoronary bypass graft: Secondary | ICD-10-CM | POA: Diagnosis not present

## 2020-08-05 DIAGNOSIS — Z9889 Other specified postprocedural states: Secondary | ICD-10-CM

## 2020-08-05 DIAGNOSIS — J90 Pleural effusion, not elsewhere classified: Secondary | ICD-10-CM

## 2020-08-05 DIAGNOSIS — J9811 Atelectasis: Secondary | ICD-10-CM | POA: Diagnosis not present

## 2020-08-05 MED ORDER — FUROSEMIDE 40 MG PO TABS: 40.0000 mg | ORAL_TABLET | Freq: Every day | ORAL | 1 refills | Status: DC

## 2020-08-05 MED ORDER — POTASSIUM CHLORIDE CRYS ER 20 MEQ PO TBCR
20.0000 meq | EXTENDED_RELEASE_TABLET | Freq: Every day | ORAL | 1 refills | Status: DC
Start: 2020-08-05 — End: 2020-09-23

## 2020-08-05 MED ORDER — LISINOPRIL 20 MG PO TABS: 20.0000 mg | ORAL_TABLET | Freq: Every day | ORAL | 1 refills | Status: DC

## 2020-08-05 NOTE — Patient Instructions (Addendum)
Changes to medications: Do not take Plavix Wednesday 08/25. Resume Plavix Friday 08/27 (after left thoracentesis has been done) Increase Lisinopril to 20 mg daily Lasix 40 mg daily with a potassium supplement 20 meq starting Tuesday 08/25   You may continue to gradually increase your physical activity as tolerated.  Refrain from any heavy lifting or strenuous use of your arms and shoulders until at least 8 weeks from the time of your surgery, and avoid activities that cause increased pain in your chest on the side of your surgical incision.  Otherwise, you may continue to increase activities without any particular limitations.  Increase the intensity and duration of physical activity gradually.  You are encouraged to enroll and participate in the outpatient cardiac rehab program beginning as soon as practical.  Endocarditis is a potentially serious infection of heart valves or inside lining of the heart.  It occurs more commonly in patients with diseased heart valves (such as patient's with aortic or mitral valve disease) and in patients who have undergone heart valve repair or replacement.  Certain surgical and dental procedures may put you at risk, such as dental cleaning, other dental procedures, or any surgery involving the respiratory, urinary, gastrointestinal tract, gallbladder or prostate gland.   To minimize your chances for develooping endocarditis, maintain good oral health and seek prompt medical attention for any infections involving the mouth, teeth, gums, skin or urinary tract.    Always notify your doctor or dentist about your underlying heart valve condition before having any invasive procedures. You will need to take antibiotics before certain procedures, including all routine dental cleanings or other dental procedures.  Your cardiologist or dentist should prescribe these antibiotics for you to be taken ahead of time.

## 2020-08-05 NOTE — Telephone Encounter (Signed)
error 

## 2020-08-05 NOTE — Progress Notes (Signed)
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 Roxy [name].The patient was discharged in stable condition on 07/26/2020. Patient called last week for complaints of increasing lowe extremity edema. Lasix and potassium were called into [facility] but [name] states she has only given him a few days of these as it causes him to urinate a lot . [name] has accompanied him to this visit and states he does not have much appetite (denies nausea, vomiting, abdominal pain) but she is giving him Ensure. He had home PT arranged but [name] was "afraid patient would hurt [facility] breast bone"  Current Outpatient Medications  Medication Sig Dispense Refill  . clopidogrel (PLAVIX) 75 MG tablet Take 1 tablet (75 mg total) by mouth daily. 30 tablet 1  . Deutetrabenazine ([name]) 9 MG TABS Take 18 mg by mouth in the morning and at bedtime. 120 tablet 1  . furosemide (LASIX) 40 MG tablet Take 1 tablet (40 mg total) by mouth daily for 10 days. 10 tablet 0  . hydrOXYzine (ATARAX/VISTARIL) 25 MG tablet TAKE 1 TABLET BY MOUTH AT BEDTIME AS NEEDED (Patient taking differently: Take 25 mg by mouth at bedtime. ) 30 tablet 1  . lisinopril (ZESTRIL) 10 MG tablet Take 1 tablet (10 mg total) by mouth daily. 30 tablet 1  . LORazepam (ATIVAN) 0.5 MG tablet Take 1 tablet (0.5 mg total) by mouth every 6 (six) hours as needed for anxiety. 120 tablet 1  . metoprolol tartrate (LOPRESSOR) 25 MG tablet Take 1 tablet (25 mg total) by mouth 2 (two) times daily. 60 tablet 1  . Multiple Vitamin (MULTIVITAMIN WITH MINERALS) TABS tablet Take 2 tablets by mouth daily. 50 +    . nystatin (MYCOSTATIN) 100000 UNIT/ML suspension Take 5 mLs (500,000 Units total) by mouth 3 (three) times daily for 10 days. 150 mL 0  . potassium chloride (KLOR-CON) 20 MEQ tablet Take 1 tablet (20 mEq total) by mouth daily for 10 days. 10 tablet 0   . rosuvastatin (CRESTOR) 10 MG tablet Take 1 tablet (10 mg total) by mouth daily. 30 tablet 1  . sennosides-docusate sodium (SENOKOT-S) 8.6-50 MG tablet Take 1 tablet by mouth daily with supper.    . sertraline (ZOLOFT) 50 MG tablet TAKE 1 AND 1/2 TABLETS DAILY BY MOUTH (Patient taking differently: Take 75 mg by mouth daily. ) 135 tablet 0  . VITAMIN D PO Take 1 capsule by mouth daily.    Vital Signs: BP 145/80, HR 100, RR 20, Oxygenation 90% on room air  Physical Exam: CV-Slightly tachycardic Pulmonary-Diminished bibasilar breath sounds Abdomen-Soft, non tender, sporadic bowel sounds present Extremities-+1 LE edema Wounds-Clean and dry  Diagnostic Tests: Narrative & Impression  CLINICAL DATA:  Status post coronary bypass graft.  EXAM: CHEST - 2 VIEW  COMPARISON:  July 23, 2020.  FINDINGS: Status post coronary artery bypass graft. Interval development of moderate left pleural effusion with underlying atelectasis. Mild right basilar atelectasis is noted as well. No pneumothorax is noted. Bony thorax is unremarkable.  IMPRESSION: Interval development of moderate left pleural effusion with underlying atelectasis. Mild right basilar atelectasis is noted as well.   Electronically Signed   By: [facility] [name] M.D.   On: 08/05/2020 13:46    Impression and Plan: Neil [name] has a moderate left pleural effusion. We have arranged for a left thoracentesis to be done on Thursday 08/26. He was instructed to  stop Plavix on Wednesday 08/25. He may resume Plavix on Friday 08/27. He was also instructed to take Lasix 40 mg daily and potassium 20 meq daily starting Tuesday 08/24. He is hypertensive on this visit so Lisinopril was increased to 20 mg daily. He has an appointment to see the NP from cardiology on 08/25. He has an echo scheduled for 09/02/2020 as well. I spoke with the [name] about continuing sternal precautions for at least 4-6 more weeks, as well having home PT come work with  patient as he is very deconditioned. I discussed the preceding treatment with Dr. Roxy [name], who is in agreement. Patient will return with a PA/LAT CXR to see [place] Monday 08/12/2020.    Neil Skillern, PA-C [facility] [phone]

## 2020-08-06 ENCOUNTER — Other Ambulatory Visit (HOSPITAL_COMMUNITY)
Admission: RE | Admit: 2020-08-06 | Discharge: 2020-08-06 | Disposition: A | Discharge status: Home / Self Care | Payer: Medicare HMO | Source: Ambulatory Visit | Attending: Thoracic Surgery (Cardiothoracic Vascular Surgery) | Admitting: Thoracic Surgery (Cardiothoracic Vascular Surgery)

## 2020-08-06 ENCOUNTER — Telehealth: Payer: Self-pay

## 2020-08-06 DIAGNOSIS — Z01812 Encounter for preprocedural laboratory examination: Secondary | ICD-10-CM | POA: Diagnosis present

## 2020-08-06 DIAGNOSIS — Z20822 Contact with and (suspected) exposure to covid-19: Secondary | ICD-10-CM | POA: Insufficient documentation

## 2020-08-06 LAB — SARS CORONAVIRUS 2 (TAT 6-24 HRS): SARS Coronavirus 2: NEGATIVE

## 2020-08-06 NOTE — Telephone Encounter (Signed)
Mrs. Neil [name] contacted the office requesting patient restart PT/OT.  He is s/p CABG x4 07/19/20 with Dr. Roxy Manns.  Patient was originally scheduled to be evaluated for PT/ OT but patient refused because he "felt too weak".  Spoke with Benjamine Mola with [facility] at [phone] and stated that PT/OT could be restarted.  Spoke with patient's wife to make aware.  Answered her questions, she acknowledged receipt.

## 2020-08-06 NOTE — Telephone Encounter (Signed)
Patient's PT is Neil [name] with [facility] at [phone].  Will await signed orders for physician signature.

## 2020-08-07 ENCOUNTER — Ambulatory Visit: Payer: Medicare HMO | Admitting: General Practice

## 2020-08-07 ENCOUNTER — Other Ambulatory Visit: Payer: Self-pay | Admitting: Psychiatry

## 2020-08-07 DIAGNOSIS — F333 Major depressive disorder, recurrent, severe with psychotic symptoms: Secondary | ICD-10-CM

## 2020-08-07 DIAGNOSIS — F411 Generalized anxiety disorder: Secondary | ICD-10-CM

## 2020-08-08 ENCOUNTER — Other Ambulatory Visit: Payer: Self-pay

## 2020-08-08 ENCOUNTER — Ambulatory Visit (HOSPITAL_COMMUNITY)
Admission: RE | Admit: 2020-08-08 | Discharge: 2020-08-08 | Disposition: A | Discharge status: Home / Self Care | Payer: Medicare HMO | Source: Ambulatory Visit | Attending: Thoracic Surgery (Cardiothoracic Vascular Surgery) | Admitting: Thoracic Surgery (Cardiothoracic Vascular Surgery)

## 2020-08-08 ENCOUNTER — Ambulatory Visit (HOSPITAL_COMMUNITY)
Admission: RE | Admit: 2020-08-08 | Discharge: 2020-08-08 | Disposition: A | Discharge status: Home / Self Care | Payer: Medicare HMO | Source: Ambulatory Visit | Attending: Student | Admitting: Student

## 2020-08-08 ENCOUNTER — Other Ambulatory Visit (HOSPITAL_COMMUNITY): Payer: Self-pay | Admitting: Student

## 2020-08-08 DIAGNOSIS — J90 Pleural effusion, not elsewhere classified: Secondary | ICD-10-CM

## 2020-08-08 DIAGNOSIS — J984 Other disorders of lung: Secondary | ICD-10-CM | POA: Diagnosis not present

## 2020-08-08 HISTORY — PX: IR THORACENTESIS ASP PLEURAL SPACE W/IMG GUIDE: IMG5380

## 2020-08-08 MED ORDER — LIDOCAINE HCL 1 % IJ SOLN
INTRAMUSCULAR | Status: AC
  Filled 2020-08-08: qty 20

## 2020-08-08 NOTE — Telephone Encounter (Signed)
I didn't realize he just had CABG surgery

## 2020-08-08 NOTE — Procedures (Signed)
PROCEDURE SUMMARY:  Successful US guided left thoracentesis. Yielded 1.5 L of amber-colored fluid. Pt tolerated procedure well. No immediate complications.  CXR ordered; no post-procedure pneumothorax  EBL < 5 mL  Theresa Duty, NP 08/08/2020 3:22 PM

## 2020-08-09 ENCOUNTER — Other Ambulatory Visit: Payer: Self-pay | Admitting: Thoracic Surgery (Cardiothoracic Vascular Surgery)

## 2020-08-09 DIAGNOSIS — R69 Illness, unspecified: Secondary | ICD-10-CM | POA: Diagnosis not present

## 2020-08-09 DIAGNOSIS — Z951 Presence of aortocoronary bypass graft: Secondary | ICD-10-CM | POA: Diagnosis not present

## 2020-08-09 DIAGNOSIS — Z952 Presence of prosthetic heart valve: Secondary | ICD-10-CM | POA: Diagnosis not present

## 2020-08-09 DIAGNOSIS — Z9181 History of falling: Secondary | ICD-10-CM | POA: Diagnosis not present

## 2020-08-09 DIAGNOSIS — Z7902 Long term (current) use of antithrombotics/antiplatelets: Secondary | ICD-10-CM | POA: Diagnosis not present

## 2020-08-09 DIAGNOSIS — I2510 Atherosclerotic heart disease of native coronary artery without angina pectoris: Secondary | ICD-10-CM | POA: Diagnosis not present

## 2020-08-09 DIAGNOSIS — E785 Hyperlipidemia, unspecified: Secondary | ICD-10-CM | POA: Diagnosis not present

## 2020-08-09 DIAGNOSIS — K5790 Diverticulosis of intestine, part unspecified, without perforation or abscess without bleeding: Secondary | ICD-10-CM | POA: Diagnosis not present

## 2020-08-09 DIAGNOSIS — Z48812 Encounter for surgical aftercare following surgery on the circulatory system: Secondary | ICD-10-CM | POA: Diagnosis not present

## 2020-08-12 ENCOUNTER — Ambulatory Visit (INDEPENDENT_AMBULATORY_CARE_PROVIDER_SITE_OTHER): Payer: Self-pay | Admitting: Surgical

## 2020-08-12 ENCOUNTER — Ambulatory Visit
Admission: RE | Admit: 2020-08-12 | Discharge: 2020-08-12 | Disposition: A | Discharge status: Home / Self Care | Payer: Medicare HMO | Source: Ambulatory Visit | Attending: Thoracic Surgery (Cardiothoracic Vascular Surgery) | Admitting: Thoracic Surgery (Cardiothoracic Vascular Surgery)

## 2020-08-12 ENCOUNTER — Other Ambulatory Visit: Payer: Self-pay

## 2020-08-12 VITALS — BP 107/71 | HR 81 | Temp 97.5°F | Ht 65.0 in | Wt 124.2 lb

## 2020-08-12 DIAGNOSIS — I2510 Atherosclerotic heart disease of native coronary artery without angina pectoris: Secondary | ICD-10-CM

## 2020-08-12 DIAGNOSIS — J90 Pleural effusion, not elsewhere classified: Secondary | ICD-10-CM

## 2020-08-12 DIAGNOSIS — Z951 Presence of aortocoronary bypass graft: Secondary | ICD-10-CM

## 2020-08-12 DIAGNOSIS — I350 Nonrheumatic aortic (valve) stenosis: Secondary | ICD-10-CM

## 2020-08-12 DIAGNOSIS — J9811 Atelectasis: Secondary | ICD-10-CM | POA: Diagnosis not present

## 2020-08-12 NOTE — Patient Instructions (Signed)
Cont lasix Condom catheter andor adult diapers

## 2020-08-12 NOTE — Progress Notes (Signed)
[address]       [place] [address]             [phone]      Neil [name] [address] Medical Record #[id number] Date of Birth: 11-May-1941  Referring: Pixie Casino, MD Primary Care: Antony Contras, MD Primary Cardiologist: No primary care provider on file.   Chief Complaint:   POST OP FOLLOW UP CARDIOTHORACIC SURGERY OPERATIVE NOTE  Date of Procedure:                07/19/2020  Preoperative Diagnosis:       ? Severe Aortic Stenosis ? 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  Postoperative Diagnosis:    Same  Procedure:        Aortic Valve Replacement             Edwards Inspiris Resilia Stented Bovine Pericardial Tissue Valve (size 29mm, ref # [id number], serial # [id number])             Aortic root enlargement using bovine pericardial patch (Nicks' technique)   Coronary Artery Bypass Grafting x 4             Left Internal Mammary Artery to Distal Left Anterior Descending Coronary Artery             Saphenous Vein Graft to Distal Right Coronary Artery             Saphenous Vein Graft to First Obtuse Marginal Branch of Left Circumflex Coronary Artery             Sequential Saphenous Vein Graft to Third Obtuse Marginal Branch of Left Circumflex Coronary Artery             Endoscopic Vein Harvest from Left Thigh  Surgeon:        Valentina Gu. Roxy Manns, MD  Assistant:       John Giovanni, PA-C  Anesthesia:    Arabella Merles, MD  Operative Findings: ? Severe aortic stenosis ? Normal left ventricular systolic function ? Moderate-severe left ventricular hypertrophy  ? Good quality left internal mammary artery conduit ? Good quality saphenous vein conduit ? Good quality target vessels for grafting History of Present Illness:    The patient is a 79 year old male seen in the office on today's date in follow-up status post the above described procedure.  He was seen last week and it was noted that he had a moderately large  left pleural effusion that has subsequently undergone thoracentesis.  This was done yesterday and they obtained 1.5 L of fluid.  He does feel somewhat better following this procedure but does have some shortness of breath.  He is having some overall difficulty with failure to thrive and remains fairly weak.  He does take Ensure supplements which he is able to take.  He has not been taking his Lasix due to nocturia which causes him to be very tired the following day.  He does have some known prostate issues and was scheduled to see urology prior to heart surgery but did not see them because of the surgery.  He does take the Lasix early in the morning so it is unclear as to whether it is more of a prostate issue or a diuretic issue.     Past Medical History:  Diagnosis Date  . Anxiety   . CAD (coronary artery disease)   . Depression   . Dyslipidemia   . 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 Schizophrenia ([place])   . Severe aortic stenosis      Social History   Tobacco Use  Smoking Status Never Smoker  Smokeless Tobacco Never Used    Social History   Substance and Sexual Activity  Alcohol Use No     Allergies  Allergen Reactions  . Aspirin Swelling and Other (See Comments)    Tongue swelling   . Sulfur Other (See Comments)    Reaction:  Unknown   . Tetanus Toxoids Swelling and Other (See Comments)    Arm swelling    Current Outpatient Medications  Medication Sig Dispense Refill  . clopidogrel (PLAVIX) 75 MG tablet Take 1 tablet (75 mg total) by mouth daily. 30 tablet 1  . Deutetrabenazine ([name]) 9 MG TABS Take 18 mg by mouth in the morning and at bedtime. 120 tablet 1  . furosemide (LASIX) 40 MG tablet Take 1 tablet (40 mg total) by mouth daily for 10 days. 30 tablet 1  .  hydrOXYzine (ATARAX/VISTARIL) 25 MG tablet TAKE 1 TABLET BY MOUTH AT BEDTIME AS NEEDED (Patient taking differently: Take 25 mg by mouth at bedtime. ) 30 tablet 1  . lisinopril (ZESTRIL) 20 MG tablet Take 1 tablet (20 mg total) by mouth daily. 30 tablet 1  . LORazepam (ATIVAN) 0.5 MG tablet Take 1 tablet (0.5 mg total) by mouth every 6 (six) hours as needed for anxiety. 120 tablet 1  . metoprolol tartrate (LOPRESSOR) 25 MG tablet Take 1 tablet (25 mg total) by mouth 2 (two) times daily. 60 tablet 1  . Multiple Vitamin (MULTIVITAMIN WITH MINERALS) TABS tablet Take 2 tablets by mouth daily. 50 +    . potassium chloride SA (KLOR-CON) 20 MEQ tablet Take 1 tablet (20 mEq total) by mouth daily for 10 days. 30 tablet 1  . rosuvastatin (CRESTOR) 10 MG tablet Take 1 tablet (10 mg total) by mouth daily. 30 tablet 1  . sennosides-docusate sodium (SENOKOT-S) 8.6-50 MG tablet Take 1 tablet by mouth daily with supper.    . sertraline (ZOLOFT) 50 MG tablet TAKE 1 AND 1/2 TABLETS DAILY BY MOUTH 135 tablet 0  . VITAMIN D PO Take 1 capsule by mouth daily.     No current facility-administered medications for this visit.       Physical Exam: BP 107/71 (BP Location: Right Arm, Patient Position: Sitting, Cuff Size: Normal)   Pulse 81   Temp (!) 97.5 F (36.4 C)   Ht 5\' 5"  (1.651 m)   Wt 124 lb 3.2 oz (56.3 kg)   SpO2 97% Comment: RA  BMI 20.67 kg/m   General appearance: alert, cooperative and no distress Heart: regular rate and rhythm Lungs: dim left base Abdomen: benign Extremities: minor pedal edema Wound: incis healing well Soft systolic murmur Appears fatigued  Diagnostic Studies & Laboratory data:     Recent Radiology Findings:   DG Chest 2 View  Result Date: 08/12/2020 CLINICAL DATA:  Status post coronary bypass graft. EXAM: CHEST - 2 VIEW COMPARISON:  August 08, 2020. FINDINGS: The heart size and mediastinal contours are within normal limits. Status post coronary bypass graft as well as  aortic valve repair. No pneumothorax is noted. Right lung is clear. Moderate left pleural effusion is noted with associated  left basilar subsegmental atelectasis. The visualized skeletal structures are unremarkable. IMPRESSION: Moderate left pleural effusion with associated left basilar subsegmental atelectasis. Electronically Signed   By: Marijo Conception M.D.   On: 08/12/2020 13:51      Recent Lab Findings: Lab Results  Component Value Date   WBC 14.0 (H) 07/23/2020   HGB 11.9 (L) 07/23/2020   HCT 35.1 (L) 07/23/2020   PLT 136 (L) 07/23/2020   GLUCOSE 115 (H) 07/24/2020   ALT 22 07/17/2020   AST 18 07/17/2020   NA 132 (L) 07/24/2020   K 3.4 (L) 07/24/2020   CL 96 (L) 07/24/2020   CREATININE 0.74 07/24/2020   BUN 20 07/24/2020   CO2 24 07/24/2020   INR 0.8 07/19/2020   HGBA1C 5.3 07/17/2020      Assessment / Plan: 79 year old male status post above described procedure seen in the office today following thoracentesis done yesterday.  They did obtain 1.5 L.  Chest x-ray report is as noted above.  I have encouraged the wife to resume the previous Lasix and potassium as it may assist with the effusion at this time.  Also encouraging incentive spirometry as there may be an atelectatic component to it.  He is to get physical therapy at home.  I encouraged him to continue his nutrition supplements and encourage diet as able.  We discussed using a condom catheter at night and he may be able to obtain this from a local medical supply company.  They can be ordered online but family does not use computers due to previous problems (sounds as though they were hacked).  I discussed that he may require repeat thoracentesis or possibility of Pleurx placement.  The family is not interested in skilled nursing facility placement due to Covid.  She does have a son who lives with them and is very helpful with his needs.  We will see him in 2 weeks or prior to that should he become more symptomatic.  We will  repeat chest x-ray at that time.      Medication Changes: No orders of the defined types were placed in this encounter.     John Giovanni, PA-C 08/12/2020 2:23 PM

## 2020-08-13 DIAGNOSIS — K5790 Diverticulosis of intestine, part unspecified, without perforation or abscess without bleeding: Secondary | ICD-10-CM | POA: Diagnosis not present

## 2020-08-13 DIAGNOSIS — Z952 Presence of prosthetic heart valve: Secondary | ICD-10-CM | POA: Diagnosis not present

## 2020-08-13 DIAGNOSIS — I2510 Atherosclerotic heart disease of native coronary artery without angina pectoris: Secondary | ICD-10-CM | POA: Diagnosis not present

## 2020-08-13 DIAGNOSIS — Z7902 Long term (current) use of antithrombotics/antiplatelets: Secondary | ICD-10-CM | POA: Diagnosis not present

## 2020-08-13 DIAGNOSIS — E785 Hyperlipidemia, unspecified: Secondary | ICD-10-CM | POA: Diagnosis not present

## 2020-08-13 DIAGNOSIS — Z951 Presence of aortocoronary bypass graft: Secondary | ICD-10-CM | POA: Diagnosis not present

## 2020-08-13 DIAGNOSIS — R69 Illness, unspecified: Secondary | ICD-10-CM | POA: Diagnosis not present

## 2020-08-13 DIAGNOSIS — Z48812 Encounter for surgical aftercare following surgery on the circulatory system: Secondary | ICD-10-CM | POA: Diagnosis not present

## 2020-08-14 DIAGNOSIS — E785 Hyperlipidemia, unspecified: Secondary | ICD-10-CM | POA: Diagnosis not present

## 2020-08-14 DIAGNOSIS — R69 Illness, unspecified: Secondary | ICD-10-CM | POA: Diagnosis not present

## 2020-08-14 DIAGNOSIS — Z952 Presence of prosthetic heart valve: Secondary | ICD-10-CM | POA: Diagnosis not present

## 2020-08-14 DIAGNOSIS — I2510 Atherosclerotic heart disease of native coronary artery without angina pectoris: Secondary | ICD-10-CM | POA: Diagnosis not present

## 2020-08-14 DIAGNOSIS — Z951 Presence of aortocoronary bypass graft: Secondary | ICD-10-CM | POA: Diagnosis not present

## 2020-08-14 DIAGNOSIS — K5790 Diverticulosis of intestine, part unspecified, without perforation or abscess without bleeding: Secondary | ICD-10-CM | POA: Diagnosis not present

## 2020-08-14 DIAGNOSIS — Z48812 Encounter for surgical aftercare following surgery on the circulatory system: Secondary | ICD-10-CM | POA: Diagnosis not present

## 2020-08-14 DIAGNOSIS — Z7902 Long term (current) use of antithrombotics/antiplatelets: Secondary | ICD-10-CM | POA: Diagnosis not present

## 2020-08-15 DIAGNOSIS — R5381 Other malaise: Secondary | ICD-10-CM | POA: Diagnosis not present

## 2020-08-15 DIAGNOSIS — G2401 Drug induced subacute dyskinesia: Secondary | ICD-10-CM | POA: Diagnosis not present

## 2020-08-15 DIAGNOSIS — Z951 Presence of aortocoronary bypass graft: Secondary | ICD-10-CM | POA: Diagnosis not present

## 2020-08-15 DIAGNOSIS — E782 Mixed hyperlipidemia: Secondary | ICD-10-CM | POA: Diagnosis not present

## 2020-08-15 DIAGNOSIS — Z7189 Other specified counseling: Secondary | ICD-10-CM | POA: Diagnosis not present

## 2020-08-15 DIAGNOSIS — I2510 Atherosclerotic heart disease of native coronary artery without angina pectoris: Secondary | ICD-10-CM | POA: Diagnosis not present

## 2020-08-15 DIAGNOSIS — Z952 Presence of prosthetic heart valve: Secondary | ICD-10-CM | POA: Diagnosis not present

## 2020-08-15 DIAGNOSIS — R69 Illness, unspecified: Secondary | ICD-10-CM | POA: Diagnosis not present

## 2020-08-17 ENCOUNTER — Other Ambulatory Visit: Payer: Self-pay | Admitting: Surgical

## 2020-08-20 DIAGNOSIS — I2510 Atherosclerotic heart disease of native coronary artery without angina pectoris: Secondary | ICD-10-CM | POA: Diagnosis not present

## 2020-08-20 DIAGNOSIS — Z48812 Encounter for surgical aftercare following surgery on the circulatory system: Secondary | ICD-10-CM | POA: Diagnosis not present

## 2020-08-20 DIAGNOSIS — Z952 Presence of prosthetic heart valve: Secondary | ICD-10-CM | POA: Diagnosis not present

## 2020-08-20 DIAGNOSIS — Z951 Presence of aortocoronary bypass graft: Secondary | ICD-10-CM | POA: Diagnosis not present

## 2020-08-20 DIAGNOSIS — R69 Illness, unspecified: Secondary | ICD-10-CM | POA: Diagnosis not present

## 2020-08-20 DIAGNOSIS — E785 Hyperlipidemia, unspecified: Secondary | ICD-10-CM | POA: Diagnosis not present

## 2020-08-20 DIAGNOSIS — K5790 Diverticulosis of intestine, part unspecified, without perforation or abscess without bleeding: Secondary | ICD-10-CM | POA: Diagnosis not present

## 2020-08-20 DIAGNOSIS — Z7902 Long term (current) use of antithrombotics/antiplatelets: Secondary | ICD-10-CM | POA: Diagnosis not present

## 2020-08-22 ENCOUNTER — Ambulatory Visit: Payer: Self-pay | Admitting: *Deleted

## 2020-08-22 DIAGNOSIS — Z952 Presence of prosthetic heart valve: Secondary | ICD-10-CM | POA: Diagnosis not present

## 2020-08-22 DIAGNOSIS — R69 Illness, unspecified: Secondary | ICD-10-CM | POA: Diagnosis not present

## 2020-08-22 DIAGNOSIS — K5790 Diverticulosis of intestine, part unspecified, without perforation or abscess without bleeding: Secondary | ICD-10-CM | POA: Diagnosis not present

## 2020-08-22 DIAGNOSIS — I2510 Atherosclerotic heart disease of native coronary artery without angina pectoris: Secondary | ICD-10-CM | POA: Diagnosis not present

## 2020-08-22 DIAGNOSIS — E785 Hyperlipidemia, unspecified: Secondary | ICD-10-CM | POA: Diagnosis not present

## 2020-08-22 DIAGNOSIS — Z7902 Long term (current) use of antithrombotics/antiplatelets: Secondary | ICD-10-CM | POA: Diagnosis not present

## 2020-08-22 DIAGNOSIS — Z951 Presence of aortocoronary bypass graft: Secondary | ICD-10-CM | POA: Diagnosis not present

## 2020-08-22 DIAGNOSIS — Z48812 Encounter for surgical aftercare following surgery on the circulatory system: Secondary | ICD-10-CM | POA: Diagnosis not present

## 2020-08-22 NOTE — Telephone Encounter (Signed)
Summary: Nurse advice    Pt's wife has questions her husband's medication for fluid in his lungs, he has a heart procedure Monday and she wants to know if he should stop taking Lasix, today marks the tenth day of using it and she believes they were advised to take it for only ten days.  Best contact [phone]

## 2020-08-23 ENCOUNTER — Other Ambulatory Visit: Payer: Self-pay | Admitting: Thoracic Surgery (Cardiothoracic Vascular Surgery)

## 2020-08-23 DIAGNOSIS — R69 Illness, unspecified: Secondary | ICD-10-CM | POA: Diagnosis not present

## 2020-08-23 DIAGNOSIS — Z951 Presence of aortocoronary bypass graft: Secondary | ICD-10-CM

## 2020-08-23 DIAGNOSIS — Z952 Presence of prosthetic heart valve: Secondary | ICD-10-CM | POA: Diagnosis not present

## 2020-08-23 DIAGNOSIS — E785 Hyperlipidemia, unspecified: Secondary | ICD-10-CM | POA: Diagnosis not present

## 2020-08-23 DIAGNOSIS — K5790 Diverticulosis of intestine, part unspecified, without perforation or abscess without bleeding: Secondary | ICD-10-CM | POA: Diagnosis not present

## 2020-08-23 DIAGNOSIS — Z7902 Long term (current) use of antithrombotics/antiplatelets: Secondary | ICD-10-CM | POA: Diagnosis not present

## 2020-08-23 DIAGNOSIS — I2510 Atherosclerotic heart disease of native coronary artery without angina pectoris: Secondary | ICD-10-CM | POA: Diagnosis not present

## 2020-08-23 DIAGNOSIS — Z48812 Encounter for surgical aftercare following surgery on the circulatory system: Secondary | ICD-10-CM | POA: Diagnosis not present

## 2020-08-26 ENCOUNTER — Ambulatory Visit (INDEPENDENT_AMBULATORY_CARE_PROVIDER_SITE_OTHER): Payer: Self-pay | Admitting: Surgical

## 2020-08-26 ENCOUNTER — Encounter: Payer: Self-pay | Admitting: Thoracic Surgery (Cardiothoracic Vascular Surgery)

## 2020-08-26 ENCOUNTER — Other Ambulatory Visit: Payer: Self-pay

## 2020-08-26 ENCOUNTER — Ambulatory Visit
Admission: RE | Admit: 2020-08-26 | Discharge: 2020-08-26 | Disposition: A | Discharge status: Home / Self Care | Payer: Medicare HMO | Source: Ambulatory Visit | Attending: Thoracic Surgery (Cardiothoracic Vascular Surgery) | Admitting: Thoracic Surgery (Cardiothoracic Vascular Surgery)

## 2020-08-26 VITALS — BP 114/75 | HR 79 | Temp 97.8°F | Resp 20 | Ht 65.0 in | Wt 119.0 lb

## 2020-08-26 DIAGNOSIS — I2510 Atherosclerotic heart disease of native coronary artery without angina pectoris: Secondary | ICD-10-CM

## 2020-08-26 DIAGNOSIS — Z952 Presence of prosthetic heart valve: Secondary | ICD-10-CM | POA: Diagnosis not present

## 2020-08-26 DIAGNOSIS — J9811 Atelectasis: Secondary | ICD-10-CM | POA: Diagnosis not present

## 2020-08-26 DIAGNOSIS — I350 Nonrheumatic aortic (valve) stenosis: Secondary | ICD-10-CM

## 2020-08-26 DIAGNOSIS — J90 Pleural effusion, not elsewhere classified: Secondary | ICD-10-CM

## 2020-08-26 DIAGNOSIS — Z951 Presence of aortocoronary bypass graft: Secondary | ICD-10-CM

## 2020-08-26 DIAGNOSIS — Z953 Presence of xenogenic heart valve: Secondary | ICD-10-CM

## 2020-08-26 NOTE — Patient Instructions (Signed)
Continue all previous medications without any changes at this time  Continue to avoid any heavy lifting or strenuous use of your arms or shoulders for at least a total of three months from the time of surgery.  After three months you may gradually increase how much you lift or otherwise use your arms or chest as tolerated, with limits based upon whether or not activities lead to the return of significant discomfort.

## 2020-08-26 NOTE — Progress Notes (Signed)
[address]       [place] [address]             [phone]     CARDIOTHORACIC SURGERY OFFICE NOTE  Referring Provider is Hilty, Nadean Corwin, MD Primary Cardiologist is No primary care provider on file. 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  His early postoperative convalescence was complicated by coagulopathy and bleeding requiring reexploration the evening of surgery.  His physical recovery was further slowed by his severe preoperative deconditioning with chronic depression and anxiety.  Initially arrangements were made for the patient to be discharged to the inpatient rehab service for aggressive physical rehabilitation, but the patient and his wife ultimately decided to go home with home health therapy for financial reasons.  Shortly after hospital discharge the patient and his wife stopped home health physical therapy because of concerns regarding COVID-19.  The patient was seen in follow-up in our office on August 05, 2020 at which time he was noted to have a moderate sized left pleural effusion.  He subsequently underwent thoracentesis and was last seen here in our office on August 12, 2020.  He has not yet been seen in follow-up at [facility] but he has an appointment scheduled next week.  He returns to our office today for follow-up.  He reports that he is feeling stronger and is working with home physical therapy.  His appetite is improving but still somewhat poor.  He continues to supplement and try to eat more.  He denies any significant symptoms  of shortness of breath at rest but does have some dyspnea with exertion that appears most likely related to deconditioning.  He has had no difficulty with his incisions and has only mild discomfort.  He gets some relief by taking Tylenol at night which allows him to rest.  Overall he and his wife are pleased with his improvements over the past several weeks.  He does report that he has had some low blood pressures recently with systolic in the 06C to 37S.  He contacted his primary care who told him to stop taking his lisinopril until seen by [place] or cardiology.   Current Outpatient Medications  Medication Sig Dispense Refill  . clopidogrel (PLAVIX) 75 MG tablet Take 1 tablet (75 mg total) by mouth daily. 30 tablet 1  . Deutetrabenazine ([name]) 9 MG TABS Take 18 mg by mouth in the morning and at bedtime. 120 tablet 1  . hydrOXYzine (ATARAX/VISTARIL) 25 MG tablet TAKE 1 TABLET BY MOUTH AT BEDTIME AS NEEDED (Patient taking differently: Take 25 mg by mouth at bedtime. ) 30 tablet 1  . metoprolol tartrate (LOPRESSOR) 25 MG tablet Take 1 tablet (25 mg total) by mouth 2 (two) times daily. 60 tablet 1  . Multiple Vitamin (MULTIVITAMIN WITH MINERALS) TABS tablet Take 2 tablets by mouth daily. 50 +    . rosuvastatin (CRESTOR) 10 MG tablet Take 1 tablet (10 mg total) by mouth daily. 30 tablet 1  . sennosides-docusate sodium (SENOKOT-S) 8.6-50 MG tablet Take 1 tablet by mouth daily with supper.    . sertraline (  ZOLOFT) 50 MG tablet TAKE 1 AND 1/2 TABLETS DAILY BY MOUTH 135 tablet 0  . VITAMIN D PO Take 1 capsule by mouth daily.    . furosemide (LASIX) 40 MG tablet Take 1 tablet (40 mg total) by mouth daily for 10 days. 30 tablet 1  . LORazepam (ATIVAN) 0.5 MG tablet Take 1 tablet (0.5 mg total) by mouth every 6 (six) hours as needed for anxiety. (Patient not taking: Reported on 08/26/2020) 120 tablet 1  . potassium chloride SA (KLOR-CON) 20 MEQ tablet Take 1 tablet (20 mEq total) by mouth daily for 10 days. 30  tablet 1   No current facility-administered medications for this visit.      Physical Exam:   BP 114/75   Pulse 79   Temp 97.8 F (36.6 C) (Skin)   Resp 20   Ht 5\' 5"  (1.651 m)   Wt 119 lb (54 kg)   SpO2 97% Comment: RA  BMI 19.80 kg/m   General:  Elderly gentleman in no acute distress  Chest:   Clear breath sounds he takes notably shallow breaths.  CV:   Regular rate and rhythm, no murmurs  Incisions:  Well-healed  Abdomen:  Benign exam  Extremities:  No edema  Diagnostic Tests:  CHEST - 2 VIEW  COMPARISON:  08/12/2020  FINDINGS: Small left effusion has improved in the interval. Minimal left lower lobe atelectasis  Right lung remains clear. No heart failure edema. Aortic valve replacement.  IMPRESSION: Improving left effusion and left lower lobe atelectasis. No heart failure or pneumonia.   Electronically Signed   By: Franchot Gallo M.D.   On: 08/26/2020 14:40   Impression:  Patient is showing steady, slow overall progress.  His chest x-ray was reviewed and there is no significant effusions that require treatment.  I have told him that he can discontinue his Lasix and lisinopril.  He is to continue to monitor his blood pressure.  He is to continue to work with physical therapy as well as eat a higher protein diet with good whole food sources.  We will see him again in approximately 1 month.  Plan:  Discontinue lisinopril and Lasix/potassium at this time.  I spent in excess of 15 minutes during the conduct of this office consultation and >50% of this time involved direct face-to-face encounter with the patient for counseling and/or coordination of their care.  Level 2                 10 minutes Level 3                 15 minutes Level 4                 25 minutes Level 5                 40 minutes  John Giovanni, PA-C

## 2020-08-27 DIAGNOSIS — I2510 Atherosclerotic heart disease of native coronary artery without angina pectoris: Secondary | ICD-10-CM | POA: Diagnosis not present

## 2020-08-27 DIAGNOSIS — Z7902 Long term (current) use of antithrombotics/antiplatelets: Secondary | ICD-10-CM | POA: Diagnosis not present

## 2020-08-27 DIAGNOSIS — R69 Illness, unspecified: Secondary | ICD-10-CM | POA: Diagnosis not present

## 2020-08-27 DIAGNOSIS — K5790 Diverticulosis of intestine, part unspecified, without perforation or abscess without bleeding: Secondary | ICD-10-CM | POA: Diagnosis not present

## 2020-08-27 DIAGNOSIS — E785 Hyperlipidemia, unspecified: Secondary | ICD-10-CM | POA: Diagnosis not present

## 2020-08-27 DIAGNOSIS — Z48812 Encounter for surgical aftercare following surgery on the circulatory system: Secondary | ICD-10-CM | POA: Diagnosis not present

## 2020-08-27 DIAGNOSIS — Z952 Presence of prosthetic heart valve: Secondary | ICD-10-CM | POA: Diagnosis not present

## 2020-08-27 DIAGNOSIS — Z951 Presence of aortocoronary bypass graft: Secondary | ICD-10-CM | POA: Diagnosis not present

## 2020-08-28 DIAGNOSIS — Z48812 Encounter for surgical aftercare following surgery on the circulatory system: Secondary | ICD-10-CM | POA: Diagnosis not present

## 2020-08-28 DIAGNOSIS — E785 Hyperlipidemia, unspecified: Secondary | ICD-10-CM | POA: Diagnosis not present

## 2020-08-28 DIAGNOSIS — Z7902 Long term (current) use of antithrombotics/antiplatelets: Secondary | ICD-10-CM | POA: Diagnosis not present

## 2020-08-28 DIAGNOSIS — I2510 Atherosclerotic heart disease of native coronary artery without angina pectoris: Secondary | ICD-10-CM | POA: Diagnosis not present

## 2020-08-28 DIAGNOSIS — Z952 Presence of prosthetic heart valve: Secondary | ICD-10-CM | POA: Diagnosis not present

## 2020-08-28 DIAGNOSIS — Z951 Presence of aortocoronary bypass graft: Secondary | ICD-10-CM | POA: Diagnosis not present

## 2020-08-28 DIAGNOSIS — K5790 Diverticulosis of intestine, part unspecified, without perforation or abscess without bleeding: Secondary | ICD-10-CM | POA: Diagnosis not present

## 2020-08-28 DIAGNOSIS — R69 Illness, unspecified: Secondary | ICD-10-CM | POA: Diagnosis not present

## 2020-08-29 DIAGNOSIS — Z952 Presence of prosthetic heart valve: Secondary | ICD-10-CM | POA: Diagnosis not present

## 2020-08-29 DIAGNOSIS — I2510 Atherosclerotic heart disease of native coronary artery without angina pectoris: Secondary | ICD-10-CM | POA: Diagnosis not present

## 2020-08-29 DIAGNOSIS — R69 Illness, unspecified: Secondary | ICD-10-CM | POA: Diagnosis not present

## 2020-08-29 DIAGNOSIS — Z48812 Encounter for surgical aftercare following surgery on the circulatory system: Secondary | ICD-10-CM | POA: Diagnosis not present

## 2020-08-29 DIAGNOSIS — E785 Hyperlipidemia, unspecified: Secondary | ICD-10-CM | POA: Diagnosis not present

## 2020-08-29 DIAGNOSIS — Z951 Presence of aortocoronary bypass graft: Secondary | ICD-10-CM | POA: Diagnosis not present

## 2020-08-29 DIAGNOSIS — Z7902 Long term (current) use of antithrombotics/antiplatelets: Secondary | ICD-10-CM | POA: Diagnosis not present

## 2020-08-29 DIAGNOSIS — K5790 Diverticulosis of intestine, part unspecified, without perforation or abscess without bleeding: Secondary | ICD-10-CM | POA: Diagnosis not present

## 2020-09-02 ENCOUNTER — Other Ambulatory Visit: Payer: Self-pay

## 2020-09-02 ENCOUNTER — Ambulatory Visit (HOSPITAL_COMMUNITY): Payer: Medicare HMO | Attending: Cardiology

## 2020-09-02 ENCOUNTER — Other Ambulatory Visit: Payer: Self-pay | Admitting: Physician Assistant

## 2020-09-02 DIAGNOSIS — I350 Nonrheumatic aortic (valve) stenosis: Secondary | ICD-10-CM | POA: Insufficient documentation

## 2020-09-02 LAB — ECHOCARDIOGRAM COMPLETE
AR max vel: 1.12 cm2
AV Area VTI: 1.03 cm2
AV Area mean vel: 0.94 cm2
AV Mean grad: 6 mmHg
AV Peak grad: 10.8 mmHg
Ao pk vel: 1.64 m/s
Area-P 1/2: 3.19 cm2
MV M vel: 5.23 m/s
MV Peak grad: 109.4 mmHg
Radius: 0.5 cm
S' Lateral: 3 cm

## 2020-09-03 ENCOUNTER — Other Ambulatory Visit: Payer: Self-pay | Admitting: Psychiatry

## 2020-09-03 DIAGNOSIS — F5105 Insomnia due to other mental disorder: Secondary | ICD-10-CM

## 2020-09-03 NOTE — Telephone Encounter (Signed)
review 

## 2020-09-04 DIAGNOSIS — Z951 Presence of aortocoronary bypass graft: Secondary | ICD-10-CM | POA: Diagnosis not present

## 2020-09-04 DIAGNOSIS — K5790 Diverticulosis of intestine, part unspecified, without perforation or abscess without bleeding: Secondary | ICD-10-CM | POA: Diagnosis not present

## 2020-09-04 DIAGNOSIS — Z952 Presence of prosthetic heart valve: Secondary | ICD-10-CM | POA: Diagnosis not present

## 2020-09-04 DIAGNOSIS — Z7902 Long term (current) use of antithrombotics/antiplatelets: Secondary | ICD-10-CM | POA: Diagnosis not present

## 2020-09-04 DIAGNOSIS — R69 Illness, unspecified: Secondary | ICD-10-CM | POA: Diagnosis not present

## 2020-09-04 DIAGNOSIS — I2510 Atherosclerotic heart disease of native coronary artery without angina pectoris: Secondary | ICD-10-CM | POA: Diagnosis not present

## 2020-09-04 DIAGNOSIS — E785 Hyperlipidemia, unspecified: Secondary | ICD-10-CM | POA: Diagnosis not present

## 2020-09-04 DIAGNOSIS — Z48812 Encounter for surgical aftercare following surgery on the circulatory system: Secondary | ICD-10-CM | POA: Diagnosis not present

## 2020-09-05 ENCOUNTER — Other Ambulatory Visit: Payer: Self-pay | Admitting: Physician Assistant

## 2020-09-05 DIAGNOSIS — E785 Hyperlipidemia, unspecified: Secondary | ICD-10-CM | POA: Diagnosis not present

## 2020-09-05 DIAGNOSIS — I2510 Atherosclerotic heart disease of native coronary artery without angina pectoris: Secondary | ICD-10-CM | POA: Diagnosis not present

## 2020-09-05 DIAGNOSIS — Z48812 Encounter for surgical aftercare following surgery on the circulatory system: Secondary | ICD-10-CM | POA: Diagnosis not present

## 2020-09-05 DIAGNOSIS — Z951 Presence of aortocoronary bypass graft: Secondary | ICD-10-CM | POA: Diagnosis not present

## 2020-09-05 DIAGNOSIS — Z7902 Long term (current) use of antithrombotics/antiplatelets: Secondary | ICD-10-CM | POA: Diagnosis not present

## 2020-09-05 DIAGNOSIS — R69 Illness, unspecified: Secondary | ICD-10-CM | POA: Diagnosis not present

## 2020-09-05 DIAGNOSIS — K5790 Diverticulosis of intestine, part unspecified, without perforation or abscess without bleeding: Secondary | ICD-10-CM | POA: Diagnosis not present

## 2020-09-05 DIAGNOSIS — Z952 Presence of prosthetic heart valve: Secondary | ICD-10-CM | POA: Diagnosis not present

## 2020-09-06 IMAGING — CT CT HEART MORP W/ CTA COR W/ SCORE W/ CA W/CM &/OR W/O CM
1 series · 1 of 1 positions shown · non-contrast
Comparison: None.
COMPARISON: None.

Addendum:
EXAM:
OVER-READ INTERPRETATION  CT CHEST

The following report is an over-read performed by radiologist Dr.
Monik Hillman [REDACTED] on 07/02/2020. This
over-read does not include interpretation of cardiac or coronary
anatomy or pathology. The coronary calcium score/coronary CTA
interpretation by the cardiologist is attached.
CLINICAL DATA: Aortic stenosis
Cardiac TAVR CT
TECHNIQUE: The patient was scanned on a Siemens Force [REDACTED]ice scanner. A 120
kV retrospective scan was triggered in the descending thoracic aorta
at 111 HU's. Gantry rotation speed was 270 msecs and collimation was
.9 mm. No beta blockade or nitro were given. The 3D data set was
reconstructed in 5% intervals of the R-R cycle. Systolic and
diastolic phases were analyzed on a dedicated work station using
MPR, MIP and VRT modes. The patient received 80 cc of contrast.

[Series 231: lm 12.9 · 0.16mm/px · 1 of 1 slices shown]
[im 1/1]
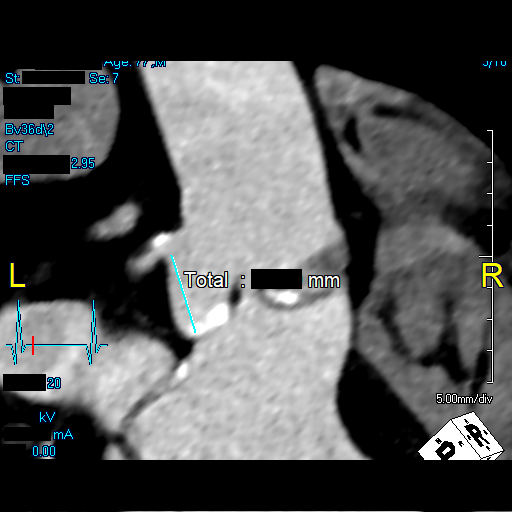

[1 of 1 positions shown; findings below may reference images not displayed]

FINDINGS: Extracardiac findings will be described separately under dictation
for contemporaneously obtained CTA chest, abdomen and pelvis.
IMPRESSION: Please see separate dictation for contemporaneously obtained CTA
chest, abdomen and pelvis dated 07/02/2020 for full description of
relevant extracardiac findings.
FINDINGS: Aortic Valve: Tri leaflet calcified with restricted leaflet motion

Aorta: Normal arch vessels no aneurysm mild calcific atherosclerosis

Sinotubular Junction: 23 mm

Ascending Thoracic Aorta: 27 mm

Aortic Arch: 23 mm

Descending Thoracic Aorta: 23 mm

Sinus of Valsalva Measurements:

Non-coronary: 28.8 mm

Right - coronary: 26.6 mm

Left - coronary: 26 mm

Coronary Artery Height above Annulus:

Left Main: 12.9 mm above annulus

Right Coronary: 12.3 mm above annulus

Virtual Basal Annulus Measurements:

Maximum/Minimum Diameter: 27.6 mm x 22.5 mm

Perimeter: 77 mm

Area: 455 mm 2

Coronary Arteries: Sufficient height above annulus for deployment

Optimum Fluoroscopic Angle for Delivery: RAO 7 Caudal 4 degrees
IMPRESSION: 1. Tri leaflet AV with annular area of 455 mm2 suitable for a 26 mm
Sapien 3 valve. However the patient has a small STJ/Sinus diameter

2.  Normal aortic root 2.7 cm

3.  Coronary arteries sufficient height above annulus for deployment

4.  Optimum angiographic angle for deployment RAO 7 Caudal 4 degrees

Juleth Sonda

*** End of Addendum ***
EXAM:
OVER-READ INTERPRETATION  CT CHEST

The following report is an over-read performed by radiologist Dr.
Monik Hillman [REDACTED] on 07/02/2020. This
over-read does not include interpretation of cardiac or coronary
anatomy or pathology. The coronary calcium score/coronary CTA
interpretation by the cardiologist is attached.
FINDINGS: Extracardiac findings will be described separately under dictation
for contemporaneously obtained CTA chest, abdomen and pelvis.
IMPRESSION: Please see separate dictation for contemporaneously obtained CTA
chest, abdomen and pelvis dated 07/02/2020 for full description of
relevant extracardiac findings.

## 2020-09-12 DIAGNOSIS — K5790 Diverticulosis of intestine, part unspecified, without perforation or abscess without bleeding: Secondary | ICD-10-CM | POA: Diagnosis not present

## 2020-09-12 DIAGNOSIS — R69 Illness, unspecified: Secondary | ICD-10-CM | POA: Diagnosis not present

## 2020-09-12 DIAGNOSIS — I2510 Atherosclerotic heart disease of native coronary artery without angina pectoris: Secondary | ICD-10-CM | POA: Diagnosis not present

## 2020-09-12 DIAGNOSIS — Z7902 Long term (current) use of antithrombotics/antiplatelets: Secondary | ICD-10-CM | POA: Diagnosis not present

## 2020-09-12 DIAGNOSIS — Z48812 Encounter for surgical aftercare following surgery on the circulatory system: Secondary | ICD-10-CM | POA: Diagnosis not present

## 2020-09-12 DIAGNOSIS — Z952 Presence of prosthetic heart valve: Secondary | ICD-10-CM | POA: Diagnosis not present

## 2020-09-12 DIAGNOSIS — Z951 Presence of aortocoronary bypass graft: Secondary | ICD-10-CM | POA: Diagnosis not present

## 2020-09-12 DIAGNOSIS — E785 Hyperlipidemia, unspecified: Secondary | ICD-10-CM | POA: Diagnosis not present

## 2020-09-13 ENCOUNTER — Other Ambulatory Visit: Payer: Self-pay | Admitting: Surgical

## 2020-09-13 ENCOUNTER — Telehealth: Payer: Self-pay | Admitting: Internal Medicine

## 2020-09-13 NOTE — Telephone Encounter (Signed)
Patient and wife returned call asking for repeat result info. Explained test results to patient. He states his wife told him there was a "tear" - explained this was not something I would have said given this was not indicated in the result note provided by MD. Durward Fortes he continue medications and keep follow up with [facility] and Dr. Debara Pickett as planned.

## 2020-09-13 NOTE — Telephone Encounter (Signed)
Patient's wife aware of results. Scheduled visit on 10/25/2020

## 2020-09-13 NOTE — Telephone Encounter (Addendum)
Patient also needs visit with Dr. Debara Pickett in 1 month

## 2020-09-13 NOTE — Telephone Encounter (Signed)
LVEF 45-50%, normally functioning bioprosthetic aortic valve  Dr Lemmie Evens

## 2020-09-13 NOTE — Telephone Encounter (Signed)
Follow Up:   Wife is calling to see if pt's Echo results are ready from 09-02-20 please.

## 2020-09-17 ENCOUNTER — Other Ambulatory Visit: Payer: Self-pay | Admitting: Surgical

## 2020-09-19 ENCOUNTER — Other Ambulatory Visit: Payer: Self-pay | Admitting: Surgical

## 2020-09-19 DIAGNOSIS — R69 Illness, unspecified: Secondary | ICD-10-CM | POA: Diagnosis not present

## 2020-09-19 DIAGNOSIS — E785 Hyperlipidemia, unspecified: Secondary | ICD-10-CM | POA: Diagnosis not present

## 2020-09-19 DIAGNOSIS — Z7902 Long term (current) use of antithrombotics/antiplatelets: Secondary | ICD-10-CM | POA: Diagnosis not present

## 2020-09-19 DIAGNOSIS — I2510 Atherosclerotic heart disease of native coronary artery without angina pectoris: Secondary | ICD-10-CM | POA: Diagnosis not present

## 2020-09-19 DIAGNOSIS — Z951 Presence of aortocoronary bypass graft: Secondary | ICD-10-CM | POA: Diagnosis not present

## 2020-09-19 DIAGNOSIS — Z48812 Encounter for surgical aftercare following surgery on the circulatory system: Secondary | ICD-10-CM | POA: Diagnosis not present

## 2020-09-19 DIAGNOSIS — Z952 Presence of prosthetic heart valve: Secondary | ICD-10-CM | POA: Diagnosis not present

## 2020-09-19 DIAGNOSIS — K5790 Diverticulosis of intestine, part unspecified, without perforation or abscess without bleeding: Secondary | ICD-10-CM | POA: Diagnosis not present

## 2020-09-20 ENCOUNTER — Other Ambulatory Visit: Payer: Self-pay | Admitting: Thoracic Surgery (Cardiothoracic Vascular Surgery)

## 2020-09-20 ENCOUNTER — Other Ambulatory Visit: Payer: Self-pay | Admitting: Psychiatry

## 2020-09-20 DIAGNOSIS — G2401 Drug induced subacute dyskinesia: Secondary | ICD-10-CM

## 2020-09-20 DIAGNOSIS — Z951 Presence of aortocoronary bypass graft: Secondary | ICD-10-CM

## 2020-09-21 ENCOUNTER — Other Ambulatory Visit: Payer: Self-pay | Admitting: Internal Medicine

## 2020-09-23 ENCOUNTER — Ambulatory Visit
Admission: RE | Admit: 2020-09-23 | Discharge: 2020-09-23 | Disposition: A | Discharge status: Home / Self Care | Payer: Medicare HMO | Source: Ambulatory Visit | Attending: Thoracic Surgery (Cardiothoracic Vascular Surgery) | Admitting: Thoracic Surgery (Cardiothoracic Vascular Surgery)

## 2020-09-23 ENCOUNTER — Encounter: Payer: Self-pay | Admitting: Thoracic Surgery (Cardiothoracic Vascular Surgery)

## 2020-09-23 ENCOUNTER — Ambulatory Visit (INDEPENDENT_AMBULATORY_CARE_PROVIDER_SITE_OTHER): Payer: Self-pay | Admitting: Thoracic Surgery (Cardiothoracic Vascular Surgery)

## 2020-09-23 ENCOUNTER — Other Ambulatory Visit: Payer: Self-pay

## 2020-09-23 VITALS — BP 109/71 | HR 65 | Temp 97.5°F | Resp 18 | Ht 65.0 in | Wt 123.0 lb

## 2020-09-23 DIAGNOSIS — Z952 Presence of prosthetic heart valve: Secondary | ICD-10-CM | POA: Diagnosis not present

## 2020-09-23 DIAGNOSIS — Z951 Presence of aortocoronary bypass graft: Secondary | ICD-10-CM

## 2020-09-23 DIAGNOSIS — Z953 Presence of xenogenic heart valve: Secondary | ICD-10-CM

## 2020-09-23 DIAGNOSIS — I700 Atherosclerosis of aorta: Secondary | ICD-10-CM | POA: Diagnosis not present

## 2020-09-23 MED ORDER — CLOPIDOGREL BISULFATE 75 MG PO TABS
75.0000 mg | ORAL_TABLET | Freq: Every day | ORAL | 1 refills | Status: DC
Start: 2020-09-23 — End: 2020-10-25

## 2020-09-23 MED ORDER — METOPROLOL TARTRATE 25 MG PO TABS
25.0000 mg | ORAL_TABLET | Freq: Two times a day (BID) | ORAL | 1 refills | Status: DC
Start: 2020-09-23 — End: 2020-10-25

## 2020-09-23 MED ORDER — ROSUVASTATIN CALCIUM 10 MG PO TABS
10.0000 mg | ORAL_TABLET | Freq: Every day | ORAL | 1 refills | Status: DC
Start: 2020-09-23 — End: 2020-10-25

## 2020-09-23 NOTE — Progress Notes (Addendum)
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  He was last seen here in our office on August 26, 2020 for follow-up at which time he was making slow but steady progress.  He returns to the office today with his wife present.  Both the patient and his wife are delighted with his progress.  He has not had any further problems with shortness of breath since he underwent thoracentesis the end of August.  He has been working regularly with physical therapy and making slow but steady improvements.  He states that some days are better than others but overall he feels quite good and his wife notes that he is doing much better than he was prior to surgery.  He has never had significant pain in his chest.  Appetite is improved and he has gained 10 pounds in weight.  His strength is slowly improving.  Overall they have no complaints.  Current Outpatient Medications  Medication Sig Dispense Refill  . [name] 9 MG TABS TAKE 2 TABLETS BY MOUTH IN THE MORNING AND TAKE 2 TABLETS BY MOUTH AT BEDTIME 120 tablet 1  . clopidogrel (PLAVIX) 75 MG tablet Take 1 tablet (75 mg total) by mouth daily. 30 tablet 1  . hydrOXYzine (ATARAX/VISTARIL) 25 MG tablet TAKE 1 TABLET BY MOUTH AT BEDTIME AS NEEDED 30 tablet 1  . LORazepam (ATIVAN) 0.5 MG tablet Take 1 tablet (0.5 mg total) by mouth every 6 (six) hours as needed for anxiety. 120 tablet 1  . metoprolol tartrate (LOPRESSOR) 25 MG tablet Take 1 tablet (25 mg total) by mouth 2 (two) times  daily. 60 tablet 1  . Multiple Vitamin (MULTIVITAMIN WITH MINERALS) TABS tablet Take 2 tablets by mouth daily. 50 +    . rosuvastatin (CRESTOR) 10 MG tablet Take 1 tablet (10 mg total) by mouth daily. 30 tablet 1  . sennosides-docusate sodium (SENOKOT-S) 8.6-50 MG tablet Take 1 tablet by mouth daily with supper.    . sertraline (ZOLOFT) 50 MG tablet TAKE 1 AND 1/2 TABLETS DAILY BY MOUTH 135 tablet 0  . VITAMIN D PO Take 1 capsule by mouth daily.     No current facility-administered medications for this visit.      Physical Exam:   BP 109/71 (BP Location: Left Arm, Patient Position: Sitting)   Pulse 65   Temp (!) 97.5 F (36.4 C)   Resp 18   Ht 5\' 5"  (1.651 m)   Wt 123 lb (55.8 kg)   SpO2 96% Comment: RA with mask on  BMI 20.47 kg/m   General:  Well-appearing  Chest:   Clear to auscultation with symmetrical breath sounds  CV:   Regular rate and rhythm  Incisions:  Healing nicely, sternum stable  Abdomen:  Soft nontender  Extremities:  Warm and well-perfused  Diagnostic Tests:   ECHOCARDIOGRAM REPORT       Patient Name:  Neil [name] Date of Exam: 09/02/2020  Medical Rec #:  [id number]    Height:    65.0 in  Accession #:  [id number]   Weight:    119.0 lb  Date of Birth: 1941/04/16    BSA:     1.586 m  Patient Age:  28 years    BP:      120/73 mmHg  Patient Gender: M        HR:      69 bpm.  Exam Location: [facility]   Procedure: 2D Echo, Cardiac Doppler and Color Doppler   Indications:  I35.0 Aortic Stenosis    History:    Patient has prior history of Echocardiogram examinations,  most         recent 05/28/2020. CAD; Prior CABG.         Aortic Valve: 21 mm Inspiris resilia stented bovine  pericardial         tissue valve is present in the aortic position. Procedure  Date:         07/19/2020.    Sonographer:  Marygrace Drought RCS  Referring Phys: [place]    1. Left ventricular ejection fraction, by estimation, is 45 to 50%. The  left ventricle has mildly decreased function. The left ventricle  demonstrates global hypokinesis. There is mild concentric left ventricular  hypertrophy. Left ventricular diastolic  parameters are indeterminate.  2. Right ventricular systolic function is mildly reduced. The right  ventricular size is normal.  3. The mitral valve is degenerative. Mild mitral valve regurgitation.  Moderate mitral annular calcification.  4. The aortic valve has been repaired/replaced. Aortic valve  regurgitation is not visualized. There is a 21 mm Inspiris resilia stented  bovine pericardial tissue valve present in the aortic position. Procedure  Date: 07/19/2020. Echo findings are  consistent with normal structure and function of the aortic valve  prosthesis. Aortic valve area, by VTI measures 1.03 cm. Aortic valve mean  gradient measures 6.0 mmHg. Aortic valve Vmax measures 1.64 m/s.  5. The inferior vena cava is normal in size with greater than 50%  respiratory variability, suggesting right atrial pressure of 3 mmHg.   Conclusion(s)/Recommendation(s): S/P CABG/AVR. AVR is well seated, without  leak, with normal gradient for size. EF appears mildly and globally  reduced with post op septal motion.   FINDINGS  Left Ventricle: Left ventricular ejection fraction, by estimation, is 45  to 50%. The left ventricle has mildly decreased function. The left  ventricle demonstrates global hypokinesis. The left ventricular internal  cavity size was normal in size. There is  mild concentric left ventricular hypertrophy. Abnormal (paradoxical)  septal motion consistent with post-operative status. Left ventricular  diastolic parameters are indeterminate.   Right Ventricle: The right ventricular size is normal. No increase in  right ventricular wall thickness. Right ventricular systolic function is  mildly  reduced.   Left Atrium: Left atrial size was normal in size.   Right Atrium: Right atrial size was normal in size.   Pericardium: Trivial pericardial effusion is present.   Mitral Valve: The mitral valve is degenerative in appearance. There is  severe thickening of the mitral valve leaflet(s). There is severe  calcification of the mitral valve leaflet(s). Moderate mitral annular  calcification. Mild mitral valve regurgitation.   Tricuspid Valve: The tricuspid valve is normal in structure. Tricuspid  valve regurgitation is trivial. No evidence of tricuspid stenosis.   Aortic Valve: The aortic valve has been repaired/replaced. Aortic valve  regurgitation is not visualized. Aortic valve mean gradient  measures 6.0  mmHg. Aortic valve peak gradient measures 10.8 mmHg. Aortic valve area, by  VTI measures 1.03 cm. There is a  21 mm Inspiris resilia stented bovine pericardial tissue valve present in  the aortic position. Procedure Date: 07/19/2020. Echo findings are  consistent with normal structure and function of the aortic valve  prosthesis.   Pulmonic Valve: The pulmonic valve was grossly normal. Pulmonic valve  regurgitation is mild. No evidence of pulmonic stenosis.   Aorta: The aortic root, ascending aorta and aortic arch are all  structurally normal, with no evidence of dilitation or obstruction.   Venous: The inferior vena cava is normal in size with greater than 50%  respiratory variability, suggesting right atrial pressure of 3 mmHg.   IAS/Shunts: The atrial septum is grossly normal.     LEFT VENTRICLE  PLAX 2D  LVIDd:     3.80 cm Diastology  LVIDs:     3.00 cm LV e' medial:  4.24 cm/s  LV PW:     1.40 cm LV E/e' medial: 28.5  LV IVS:    1.30 cm LV e' lateral:  4.35 cm/s  LVOT diam:   1.80 cm LV E/e' lateral: 27.8  LV SV:     36  LV SV Index:  23  LVOT Area:   2.54 cm     RIGHT VENTRICLE  RV Basal diam: 2.60 cm  RV S prime:    8.70 cm/s  TAPSE (M-mode): 0.9 cm   LEFT ATRIUM       Index    RIGHT ATRIUM      Index  LA diam:    3.70 cm 2.33 cm/m RA Area:   10.10 cm  LA Vol (A2C):  48.7 ml 30.70 ml/m RA Volume:  19.30 ml 12.17 ml/m  LA Vol (A4C):  30.1 ml 18.97 ml/m  LA Biplane Vol: 39.5 ml 24.90 ml/m  AORTIC VALVE  AV Area (Vmax):  1.12 cm  AV Area (Vmean):  0.94 cm  AV Area (VTI):   1.03 cm  AV Vmax:      164.00 cm/s  AV Vmean:     119.000 cm/s  AV VTI:      0.352 m  AV Peak Grad:   10.8 mmHg  AV Mean Grad:   6.0 mmHg  LVOT Vmax:     72.50 cm/s  LVOT Vmean:    43.800 cm/s  LVOT VTI:     0.142 m  LVOT/AV VTI ratio: 0.40    AORTA  Ao Root diam: 2.90 cm  Ao Asc diam: 2.80 cm   MITRAL VALVE  MV Area (PHT):        SHUNTS  MV Decel Time:        Systemic VTI: 0.14 m  MR Peak grad:  109.4 mmHg Systemic Diam: 1.80 cm  MR Mean grad:  71.0 mmHg  MR Vmax:     523.00 cm/s  MR Vmean:    395.0 cm/s  MR PISA:     1.57 cm  MR PISA Eff ROA: 9 mm  MR PISA Radius: 0.50 cm  MV E velocity: 121.00 cm/s  MV A velocity: 131.00 cm/s  MV E/A ratio: 0.92   Buford Dresser MD  Electronically signed by Buford Dresser MD  Signature Date/Time: 09/02/2020/8:27:56 PM       CHEST - 2 VIEW  COMPARISON:  August 26, 2020.  FINDINGS: There is mild scarring in the left base with equivocal left pleural effusion. The lungs elsewhere are clear. The  heart size and pulmonary vascularity are normal. Status post coronary artery bypass grafting and aortic valve replacement. No adenopathy. There is aortic atherosclerosis. No bone lesions.  IMPRESSION: Mild scarring left base with equivocal left pleural effusion. Lungs elsewhere clear. Heart size normal. Postoperative changes.  Aortic Atherosclerosis (ICD10-I70.0).   Electronically Signed   By: Lowella Grip III M.D.   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  We have not recommended any changes to his current medications.  I have given the patient a 30-day supply refill of metoprolol, Plavix, and Crestor.  We will leave the decision whether or not the patient should remain on beta-blocker or consider restarting an ACE inhibitor or ARB to the discretion of Dr. Debara Pickett.  Similarly, we will leave decision regarding timing of follow-up echocardiography to reassess left ventricular function to Dr. Debara Pickett.  The patient has been reminded regarding the importance of dental hygiene and the lifelong need for antibiotic prophylaxis for all dental cleanings and other related invasive procedures.  The patient will continue to follow-up with Dr. Debara Pickett and return to our office for routine follow-up next summer, approximately 1 year following his surgery.  He will call return sooner should specific problems or questions arise.    Valentina Gu. Roxy Manns, MD 09/23/2020 1:47 PM

## 2020-09-23 NOTE — Patient Instructions (Signed)
Continue all previous medications without any changes at this time  Continue to avoid any heavy lifting or strenuous use of your arms or shoulders for at least a total of three months from the time of surgery.  After three months you may gradually increase how much you lift or otherwise use your arms or chest as tolerated, with limits based upon whether or not activities lead to the return of significant discomfort.  Endocarditis is a potentially serious infection of heart valves or inside lining of the heart.  It occurs more commonly in patients with diseased heart valves (such as patient's with aortic or mitral valve disease) and in patients who have undergone heart valve repair or replacement.  Certain surgical and dental procedures may put you at risk, such as dental cleaning, other dental procedures, or any surgery involving the respiratory, urinary, gastrointestinal tract, gallbladder or prostate gland.   To minimize your chances for develooping endocarditis, maintain good oral health and seek prompt medical attention for any infections involving the mouth, teeth, gums, skin or urinary tract.    Always notify your doctor or dentist about your underlying heart valve condition before having any invasive procedures. You will need to take antibiotics before certain procedures, including all routine dental cleanings or other dental procedures.  Your cardiologist or dentist should prescribe these antibiotics for you to be taken ahead of time.

## 2020-09-25 ENCOUNTER — Telehealth: Payer: Self-pay | Admitting: Internal Medicine

## 2020-09-25 NOTE — Telephone Encounter (Signed)
LMTCB

## 2020-09-25 NOTE — Telephone Encounter (Signed)
Patient's wife calling to get more information on whether or not her husband should get the flu and booster shot. She states that he had open heart surgery recently and want to make sure they make the right decision.

## 2020-09-26 DIAGNOSIS — Z48812 Encounter for surgical aftercare following surgery on the circulatory system: Secondary | ICD-10-CM | POA: Diagnosis not present

## 2020-09-26 DIAGNOSIS — Z7902 Long term (current) use of antithrombotics/antiplatelets: Secondary | ICD-10-CM | POA: Diagnosis not present

## 2020-09-26 DIAGNOSIS — K5790 Diverticulosis of intestine, part unspecified, without perforation or abscess without bleeding: Secondary | ICD-10-CM | POA: Diagnosis not present

## 2020-09-26 DIAGNOSIS — R69 Illness, unspecified: Secondary | ICD-10-CM | POA: Diagnosis not present

## 2020-09-26 DIAGNOSIS — I2510 Atherosclerotic heart disease of native coronary artery without angina pectoris: Secondary | ICD-10-CM | POA: Diagnosis not present

## 2020-09-26 DIAGNOSIS — E785 Hyperlipidemia, unspecified: Secondary | ICD-10-CM | POA: Diagnosis not present

## 2020-09-26 DIAGNOSIS — Z952 Presence of prosthetic heart valve: Secondary | ICD-10-CM | POA: Diagnosis not present

## 2020-09-26 DIAGNOSIS — Z951 Presence of aortocoronary bypass graft: Secondary | ICD-10-CM | POA: Diagnosis not present

## 2020-10-01 NOTE — Telephone Encounter (Signed)
Ok to get those shots, but would not get them together - probably would get COVID 19 booster (most important) with flu shot - wait for a couple of weeks to get the Shingles vaccine (side effects with this may be worse).  Dr Lemmie Evens

## 2020-10-01 NOTE — Telephone Encounter (Signed)
Follow Up:    Wife is returning Neil [name]'s call from today.

## 2020-10-01 NOTE — Telephone Encounter (Signed)
Sent to MD on vaccine questions  Due for COVID booster, flu, 2nd shingles vaccinations

## 2020-10-01 NOTE — Telephone Encounter (Signed)
Spoke with pt's wife who states husband had CABG 08/21.  Pt's wife would like to know if it is safe for pt to have his flu and Covid19 booster.  Pt is also due for second Shingles vaccine.  Pt's wife would like Dr Lysbeth Penner advice for making the right decision.  Will forward information to RN and Dr Debara Pickett for further advisement.

## 2020-10-01 NOTE — Telephone Encounter (Signed)
Left messages on mobile and home numbers for patient to call back

## 2020-10-02 NOTE — Telephone Encounter (Signed)
Spoke with wife about vaccinate recommendations from MD She asked about OTC meds for cold safe to take - advised OTC is OK, no meds that end in -D or -DM that you generally have to buy behind the counter at pharmacy  She asked which flu vaccine - recommend high dose for those over 65

## 2020-10-02 NOTE — Telephone Encounter (Signed)
Vaughan Basta calling in to return Jenna's phone call. Connected call to [place].

## 2020-10-02 NOTE — Telephone Encounter (Signed)
Left message to call back  

## 2020-10-13 IMAGING — DX DG CHEST 1V
1 series · 1 of 1 positions shown · non-contrast
Comparison: 08/05/2020

CLINICAL DATA: Post thoracentesis

EXAM:
CHEST  1 VIEW

[chest ap]
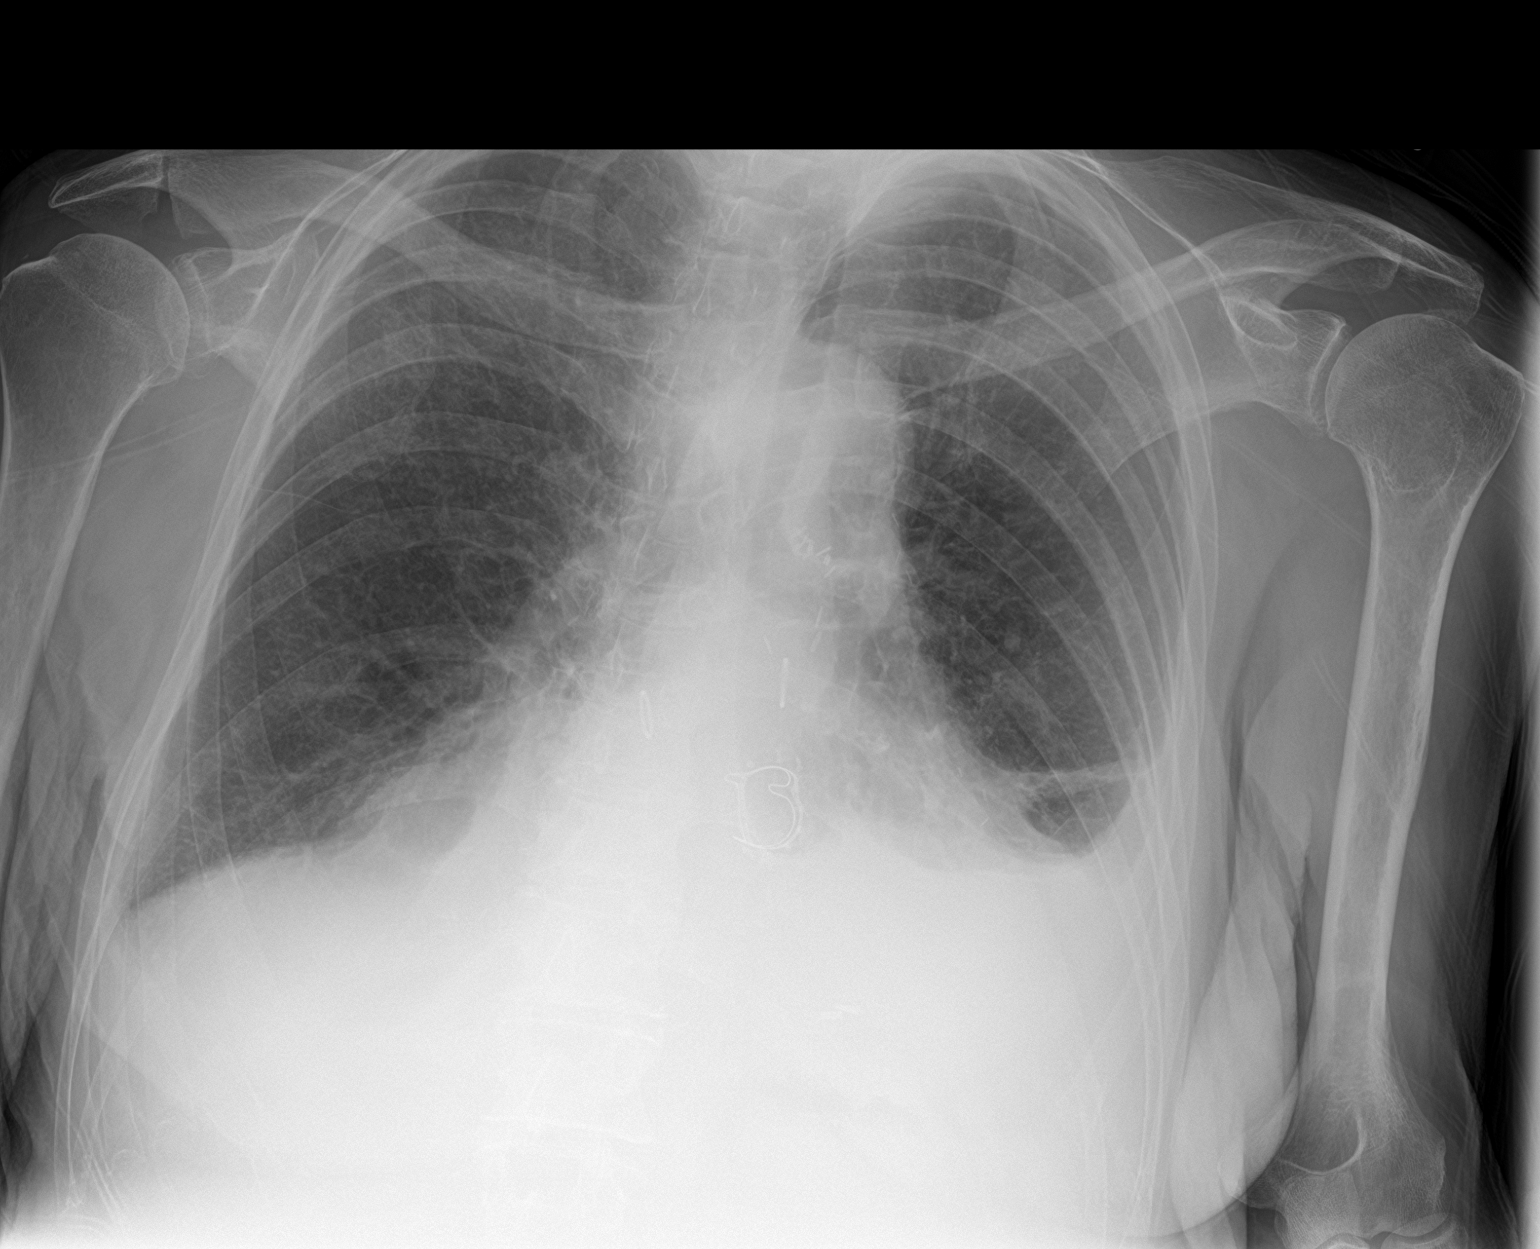

[1 of 1 positions shown; findings below may reference images not displayed]

FINDINGS: Decreased left pleural effusion with small moderate residual. No
pneumothorax is seen. Probable small right pleural effusion.
Postsurgical changes of the mediastinum. Mild cardiomegaly. Left
greater than right basilar airspace disease.
IMPRESSION: 1. Decreased left pleural effusion with small to moderate residual.
No pneumothorax.
2. Bibasilar airspace disease, left greater than right.

## 2020-10-17 ENCOUNTER — Other Ambulatory Visit: Payer: Self-pay | Admitting: Thoracic Surgery (Cardiothoracic Vascular Surgery)

## 2020-10-17 ENCOUNTER — Telehealth (INDEPENDENT_AMBULATORY_CARE_PROVIDER_SITE_OTHER): Payer: Medicare HMO | Admitting: Psychiatry

## 2020-10-17 ENCOUNTER — Encounter: Payer: Self-pay | Admitting: Psychiatry

## 2020-10-17 DIAGNOSIS — F251 Schizoaffective disorder, depressive type: Secondary | ICD-10-CM

## 2020-10-17 DIAGNOSIS — F5105 Insomnia due to other mental disorder: Secondary | ICD-10-CM | POA: Diagnosis not present

## 2020-10-17 DIAGNOSIS — G2401 Drug induced subacute dyskinesia: Secondary | ICD-10-CM | POA: Diagnosis not present

## 2020-10-17 DIAGNOSIS — F061 Catatonic disorder due to known physiological condition: Secondary | ICD-10-CM

## 2020-10-17 DIAGNOSIS — F411 Generalized anxiety disorder: Secondary | ICD-10-CM

## 2020-10-17 DIAGNOSIS — R69 Illness, unspecified: Secondary | ICD-10-CM | POA: Diagnosis not present

## 2020-10-17 IMAGING — DX DG CHEST 2V
2 series · 2 of 2 positions shown · non-contrast
Comparison: August 08, 2020.

CLINICAL DATA: Status post coronary bypass graft.

EXAM:
CHEST - 2 VIEW

[view not recorded]
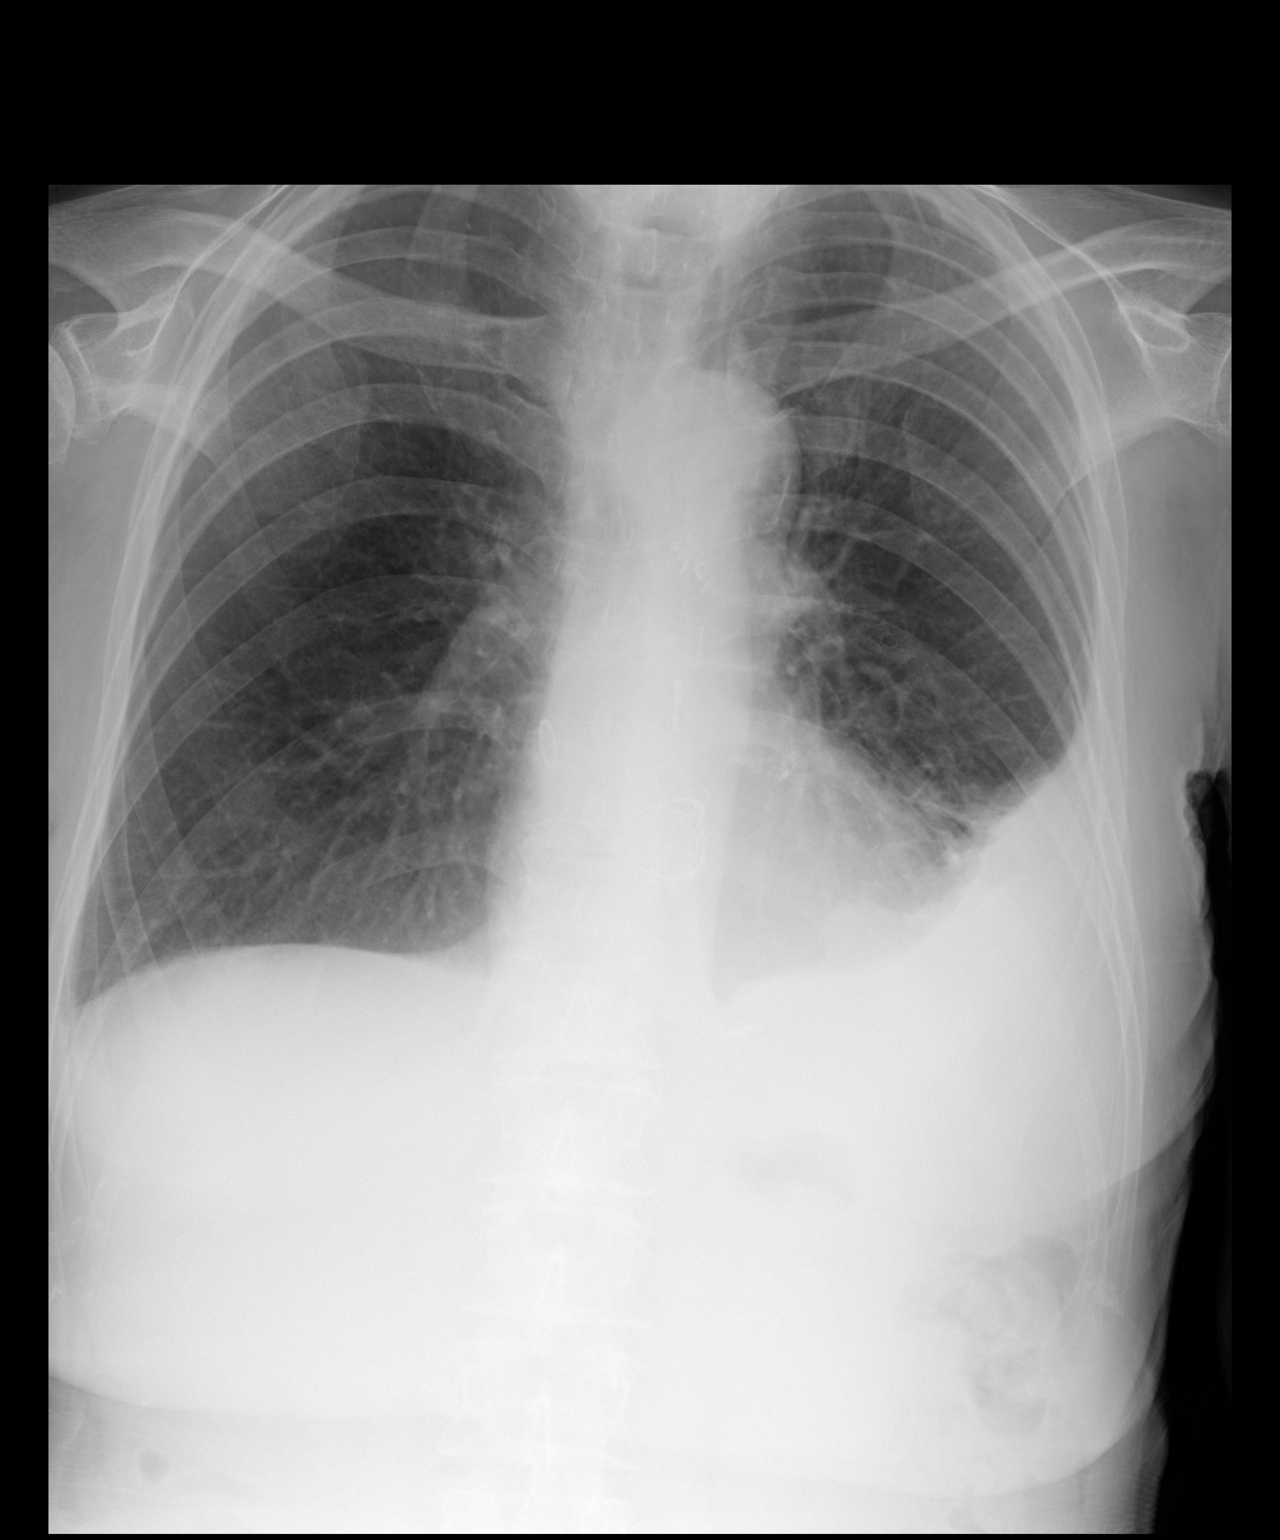

[dg chest 2 view]
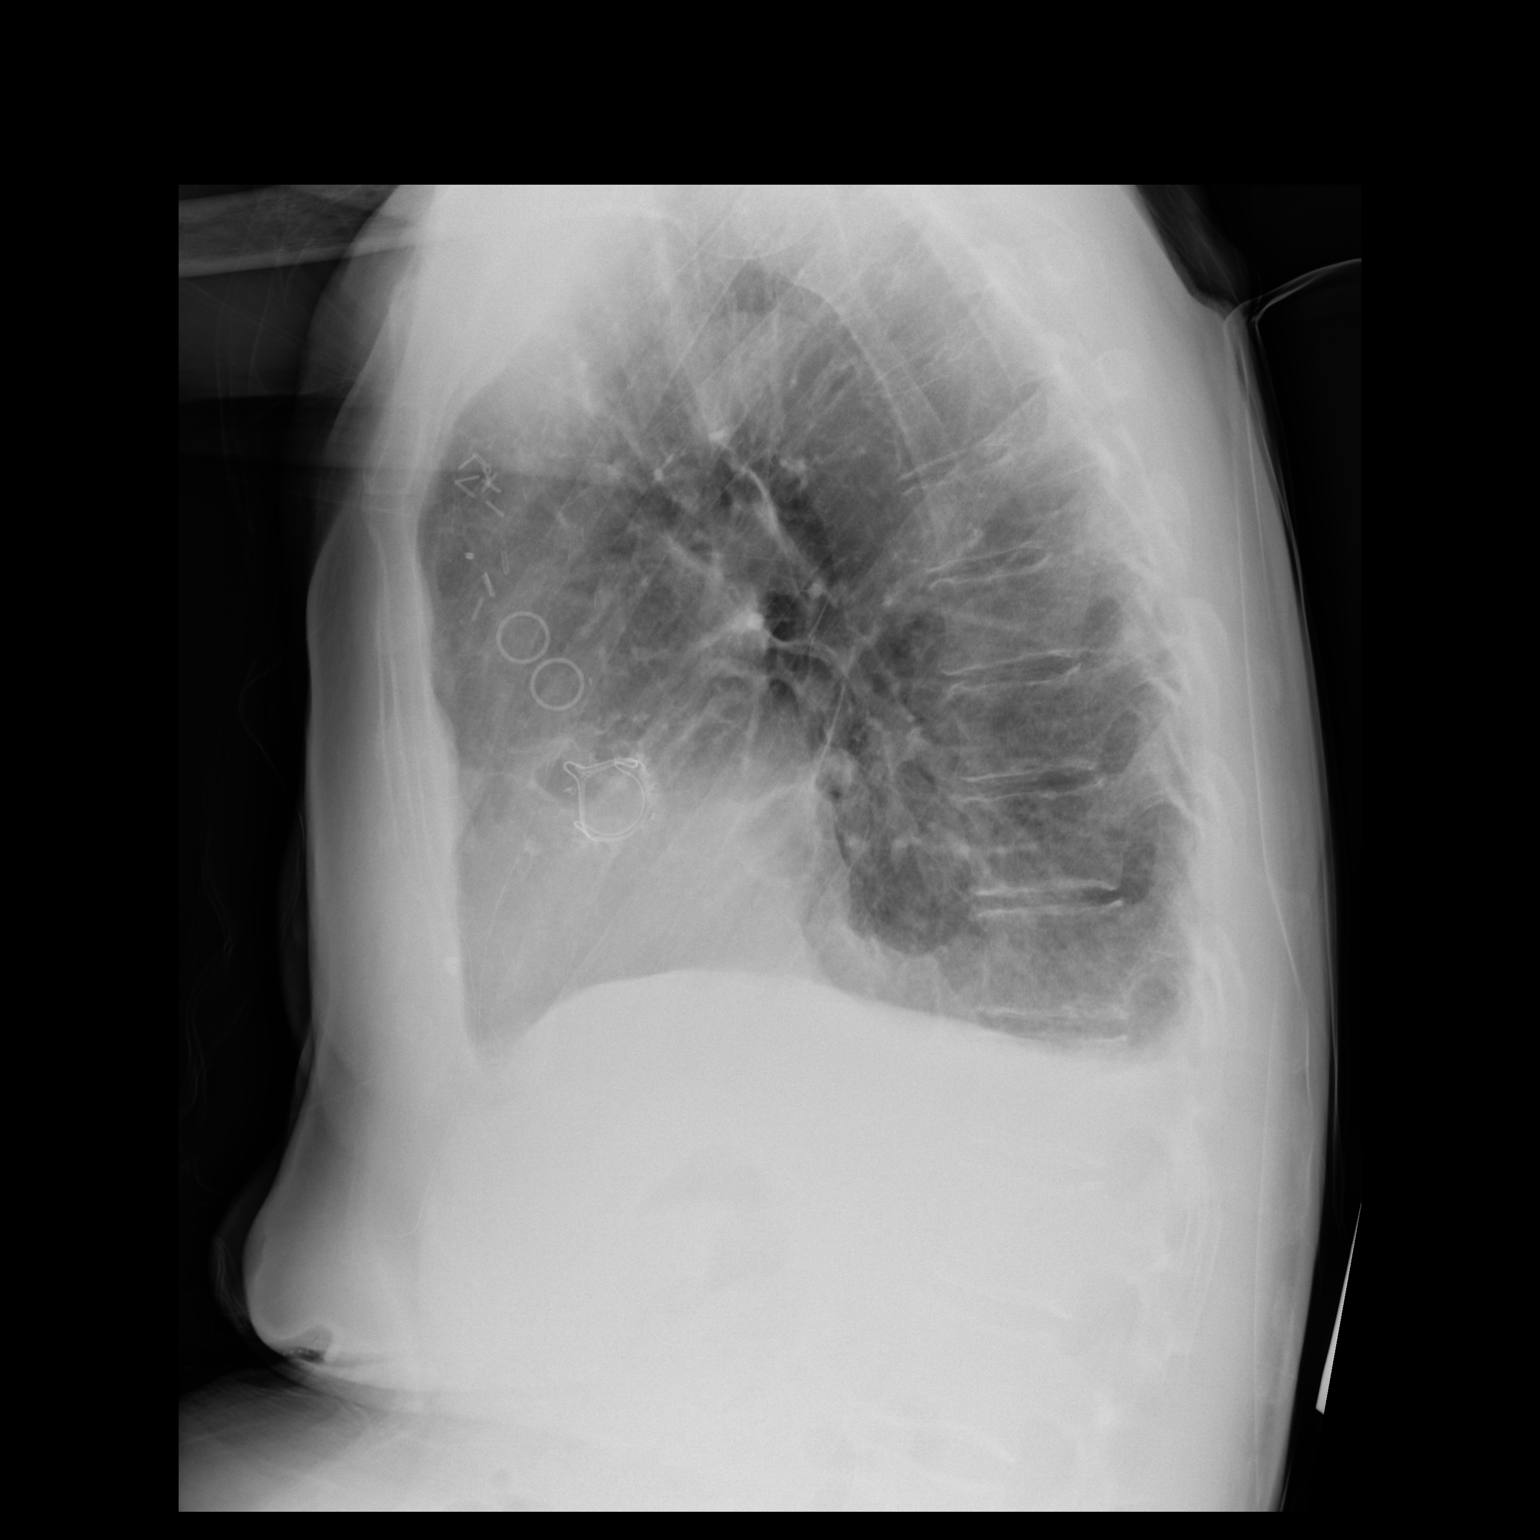

[2 of 2 positions shown; findings below may reference images not displayed]

FINDINGS: The heart size and mediastinal contours are within normal limits.
Status post coronary bypass graft as well as aortic valve repair. No
pneumothorax is noted. Right lung is clear. Moderate left pleural
effusion is noted with associated left basilar subsegmental
atelectasis. The visualized skeletal structures are unremarkable.
IMPRESSION: Moderate left pleural effusion with associated left basilar
subsegmental atelectasis.

## 2020-10-17 NOTE — Progress Notes (Signed)
Neil [name] [id number] May 17, 1941 79 y.o.   Video Visit via My Chart  I connected with pt by My Chart and verified that I am speaking with the correct person using two identifiers.   I discussed the limitations, risks, security and privacy concerns of performing an evaluation and management service by My Chart  and the availability of in person appointments. I also discussed with the patient that there may be a patient responsible charge related to this service. The patient expressed understanding and agreed to proceed.  I discussed the assessment and treatment plan with the patient. The patient was provided an opportunity to ask questions and all were answered. The patient agreed with the plan and demonstrated an understanding of the instructions.   The patient was advised to call back or seek an in-person evaluation if the symptoms worsen or if the condition fails to improve as anticipated.  I provided 30 minutes of video time during this encounter.  The patient was located at home and the provider was located office. Call started at 4 and ended 430  Subjective:   Patient ID:  Neil [name] is a 79 y.o. (DOB 07-23-1941) male.  Chief Complaint:  Chief Complaint  Patient presents with  . Follow-up  . Anxiety  . Depression  . Medication Problem    TD    Medication Refill Associated symptoms include fatigue. Pertinent negatives include no weakness.  Anxiety Symptoms include decreased concentration and nervous/anxious behavior. Patient reports no confusion or dizziness.    Depression        Associated symptoms include decreased concentration and fatigue.  Associated symptoms include no appetite change.  Past medical history includes anxiety.     Neil [name] presents today for follow-up of psychosis, depression, and anxiety.  He has required an urgent appointment because of wanting further med changes and frequent phone calls with med changes since he was last here  When  seen September 3 he was switched off olanzapine because of EPS and switched on to Saphris which was raised to 5 mg nightly but only took that dose 1 time.  The rest of the time he took only 2.5 mg nightly.   He called back 5 days later stating he wanted to stop it because it was causing him to hold his mouth open and he was having a worsening time with tongue movements and felt "funny".  He was encouraged to give it more time but he would not agree to do so.  He stopped it Friday 9/11 and still felt the funny tense feeling.    When  seen September 01, 2019.  The decision was made to switch from [place] to [place] because of difficulty getting an adequate response and limited side effects from [place].  We also discussed the antipsychotic and the plan was to start [place] but those samples were not available so he decided to restart Saphris 2.5 mg nightly.  At the visit September 15, 2011 [name] was increased to 12 mg twice daily, Saphris was discontinued and Caplyta 42 mg daily was started because of his low EPS risk.  He still needed an antipsychotic.  He continued on sertraline 75 mg daily because he felt it helped his depression.  Wife called 10/8 and said the following: On Caplyta,  Thinks it is too strong.  His movements are slow, sleeping too much, drowsy during day ( doesn't really wake up until 4pm after sleeping all night). Takes him along time to think and  respond. Had trouble with mouth and jaw before, and now he is holding his mouth open all the time.  Paranoia is better though. Was told to take it 3 days weekly.    October 16 [name] increased to 18 mg BID  Called again November 9 stating he couldn't tolerate either med.  He stopped the Caplyta and reduced the [name] to 9 mg BID a few days ago.  CO rigidity in lower back and arms.  Still bothered by constant tongue movements.  No change in worry, mood but paranoia is reduced since Caplyta.   visit October 25, 2019.  He had stopped  Caplyta.  He had also reduced [name] to 9 mg twice daily on his own.  He has been making frequent med changes [place] which is made it extremely difficult to get an antipsychotic to deal with his psychotic symptoms as well as adjust the [name] today with tardive dyskinesia symptoms.  He is very impatient with med changes.  Therefore we did not start a new antipsychotic at the last visit.  He tends to make frequent phone calls and did call again on November 23 complaining of feeling very restless and difficulty sleeping.  Was prescribed clonazepam 0.5 mg twice daily.  He called back the next day stating he wanted to stop the [name] due to restlessness but he had never taken the prescribed clonazepam.  Using courage to follow the treatment plan and take the clonazepam.  He called again the next day stating he still wanted to stop [name] and the clonazepam was making his gait unsteady.  He was informed that if he stopped the [name] his mouth movements would get worse which is been a leading complaint but he could stop it if he wanted to.  December 4 he reports clonazepam has helped the anxiety and less guilt and less rigidity.  Taking TID and only SE is a little slower movements.  1 fall, slipped on Ba floor where water was lying.  No injury.   Gained 3# and better appetite for the first time in a long time.  CO tongue movements.  Restlessness is now bearable.  Less anxiety overall.  No AH.    visit December 4.  His psychosis was not severe and the decision was made not to start another antipsychotic but instead to try to optimize treatment for tardive dyskinesia and [name] was increased to 12 mg twice daily. He called back immediately after the head visit admitting he had given false information during the visit and that he not actually been taking the [name] in the way he told me during the visit making it necessary to change the [name] to 9 mg twice daily.  He was confronted about his  noncompliance and warned that he may be discharged from the practice if he continued to be misleading and noncompliant with the physician.  This is not only compromising care but it is increasing his own risk because he is giving false information.   visit December 21, 2019.  Because of his complaints about balance issues clonazepam was reduced and spread out to 0.25 mg 4 times daily.  Also for tardive dyskinesia symptoms [name] was increased to 12 mg twice daily.  Prior to that visit he admitted being generally noncompliant with the [name] making it difficult to judge what dose to prescribe.  He was continued on sertraline 75 mg daily as he felt that was helpful for his anxiety. Says he's compliant with med changes last  visit.  Tongue movements are better but not gone. No problems with the [name].  Stiffness comes and goes and located in head and eyes.  Denies it's vertigo.  Intensity is less.  If shuts his eyes it goes right away.  visit January 04, 2020.  [name] remained 12 mg twice daily and sertraline 75 mg daily.  The following was changed: switch to lorazepam bc tiredness and balance problems to Ativan. Yes DC clonazepam and start lorazepam 0.5 mg 4 times daily  seen February 8 the patient reports the following.  Yes it did help.  Not as slowed and balance is good now.  Still feels tired with tendency to want to lay down and knows he needs to work on this.  Anxiety is pretty fair.  We decided to reduce the lorazepam to 0.5 mg 3 times daily in hopes of improving energy and balance further.  No other meds were changed.  He is still not on an antipsychotic.  Anxiety comes and goes but sometimes able to get by with TID lorazepam and energy is better.  Wife says sometimes gets uptight and can't relax so needs QID.Pt reports that mood is anxiety aand not much depression and describes anxiety as worry. Anxiety symptoms include: worry not panic and moderate.   Sleep is better usually with hydroxyzine and  sometimes lorazepam. Pt reports that appetite is Much better with weight up to 131# as low as 120#.  Normal was 140#.Marland Kitchen Pt reports that energy is better and OK. Concentration is fair.. Suicidal thoughts:  None. Denies sig paranoid thought.  Plan: No med change except okay to use hydroxyzine 25 mg nightly for insomnia.  Per usual the patient tends to make multiple phone calls between appointments.  He is called about 7 times since March.  06/04/2020 appointment with the following noted: Seen with his wife. Reports hydroxyzine 25 mg is not helpful for sleep. Still moving tongue. No SE meds currently. Not depressed.  Is anxious persistent in the back ground and not even connected to thoughts. Little intrusive thougts.   Sleep concerns, to bed 830 an not sure when goes to sleep.  Sometimes can't go to sleep. Get OOB about 9 AM.  Usually goes  back to bed.  CO weakness for 18 mos. Needs Aortic valve replacement. He tried reducing lorazepam to 0.5 mg TID and had a lot of restlessness but some improvement in energy.  Wonders if fatigue related to meds or heart valve problem.  Probably will have heart surgery. Plan: Try to reduce lorazepam to 3 and 1/2 tablets daily.  to see if energy is better.  Keep the lowest effective dose possible.   Do not exceed 4 Ativan a day. No new antipsychotics started today. Increase [name] for 1 week to 1 of 6 mg and 1 of the 9 mg tablets twice daily for 1 week, Then increase again to 12 mg tablets 1 and 1/2 tablets twice daily or 18 mg twice daily. Continue sertraline 75 mg daily for anxiety.  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  Recovering slowly.  Just started walking a week or 2 ago. Tor Netters handles the meds. [name] doesn't control tongue movements but it helps. Done fine without lorazepam and less drowsy per wife. Taking hydroxyzine 25 mg for sleep with Tylenol.  Sleeping well per wife.   Wife thinks overall he's doing well  from mental health perspective and seems calmer.  Pt says the psychosis has lightened up and  he's grateful for that.  No depression.   Wants to try to stop hydroxyzine. Wants to try to reduce sertraline to 50 mg to prevent cutting.    No history of suicide attempts.  Son [name] living with them.  Become more committed [place].  He has schizophrenia.   Past Psychiatric Medication Trials: Mirtazapine,  olanzapine 5 EPS, perphenazine started TD with mouth px,  Saphris 5 mg and stopped due to EPS complaints and feeling "funny" Caplyta side effects risperidone 2 tremors  Buspirone,  amitriptyline 25,   Hydroxyzine 10-20 Clonazepam 0.5 TID dizzy and sleepy Ativan 0.5 mg QID [place] and  [name] 12 mg BID  Review of Systems:  Review of Systems  Constitutional: Positive for fatigue. Negative for appetite change.  Gastrointestinal: Negative for constipation.  Neurological: Negative for dizziness, tremors, syncope and weakness.       No falls But complain of poor balance  Psychiatric/Behavioral: Positive for decreased concentration, depression and dysphoric mood. Negative for agitation, behavioral problems, confusion, hallucinations, self-injury and sleep disturbance. The patient is nervous/anxious. The patient is not hyperactive.     Medications: I have reviewed the patient's current medications.  Current Outpatient Medications  Medication Sig Dispense Refill  . [name] 9 MG TABS TAKE 2 TABLETS BY MOUTH IN THE MORNING AND TAKE 2 TABLETS BY MOUTH AT BEDTIME 120 tablet 1  . clopidogrel (PLAVIX) 75 MG tablet Take 1 tablet (75 mg total) by mouth daily. 30 tablet 1  . hydrOXYzine (ATARAX/VISTARIL) 25 MG tablet TAKE 1 TABLET BY MOUTH AT BEDTIME AS NEEDED 30 tablet 1  . metoprolol tartrate (LOPRESSOR) 25 MG tablet Take 1 tablet (25 mg total) by mouth 2 (two) times daily. 60 tablet 1  . Multiple Vitamin (MULTIVITAMIN WITH MINERALS) TABS tablet Take 2 tablets by mouth daily. 50 +    .  rosuvastatin (CRESTOR) 10 MG tablet Take 1 tablet (10 mg total) by mouth daily. 30 tablet 1  . sennosides-docusate sodium (SENOKOT-S) 8.6-50 MG tablet Take 1 tablet by mouth daily with supper.    . sertraline (ZOLOFT) 50 MG tablet TAKE 1 AND 1/2 TABLETS DAILY BY MOUTH 135 tablet 0  . VITAMIN D PO Take 1 capsule by mouth daily.     No current facility-administered medications for this visit.    Medication Side Effects: None  Allergies:  Allergies  Allergen Reactions  . Aspirin Swelling and Other (See Comments)    Tongue swelling   . Sulfur Other (See Comments)    Reaction:  Unknown   . Tetanus Toxoids Swelling and Other (See Comments)    Arm swelling    Past Medical History:  Diagnosis Date  . Anxiety   . CAD (coronary artery disease)   . Depression   . Dyslipidemia   . 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 Schizophrenia ([place])   . Severe aortic stenosis     Family History  Problem Relation Age of Onset  . Hypertension Mother   . Heart attack Father   . Hypertension Sister   . Hypertension Brother     Social History   Socioeconomic History  . Marital status: Married    Spouse name: Not on file  . Number of children: Not on file  . Years of education: Not on file  .  Highest education level: Not on file  Occupational History  . Not on file  Tobacco Use  . Smoking status: Never Smoker  . Smokeless tobacco: Never Used  Vaping Use  . Vaping Use: Never used  Substance and Sexual Activity  . Alcohol use: No  . Drug use: Never  . Sexual activity: Not on file  Other Topics Concern  . Not on file  Social History Narrative  . Not on file   Social Determinants of Health   Financial Resource Strain:   . Difficulty of Paying Living Expenses: Not on file  Food  Insecurity:   . Worried About Charity fundraiser in the Last Year: Not on file  . Ran Out of Food in the Last Year: Not on file  Transportation Needs:   . Lack of Transportation (Medical): Not on file  . Lack of Transportation (Non-Medical): Not on file  Physical Activity:   . Days of Exercise per Week: Not on file  . Minutes of Exercise per Session: Not on file  Stress:   . Feeling of Stress : Not on file  Social Connections:   . Frequency of Communication with Friends and Family: Not on file  . Frequency of Social Gatherings with Friends and Family: Not on file  . Attends Religious Services: Not on file  . Active Member of Clubs or Organizations: Not on file  . Attends Archivist Meetings: Not on file  . Marital Status: Not on file  Intimate Partner Violence:   . Fear of Current or Ex-Partner: Not on file  . Emotionally Abused: Not on file  . Physically Abused: Not on file  . Sexually Abused: Not on file    Past Medical History, Surgical history, Social history, and Family history were reviewed and updated as appropriate.   Please see review of systems for further details on the patient's review from today.   Objective:   Physical Exam:  There were no vitals taken for this visit.  Physical Exam Neurological:     Mental Status: He is alert and oriented to person, place, and time.     Cranial Nerves: No dysarthria.  Psychiatric:        Attention and Perception: Attention and perception normal. He does not perceive auditory or visual hallucinations.        Mood and Affect: Mood is anxious. Mood is not depressed.        Speech: Speech normal.        Behavior: Behavior is slowed.        Thought Content: Thought content normal. Thought content is not paranoid or delusional. Thought content does not include homicidal or suicidal ideation. Thought content does not include homicidal or suicidal plan.        Cognition and Memory: Cognition and memory normal.         Judgment: Judgment normal.     Comments: Insight intact     Lab Review:     Component Value Date/Time   NA 132 (L) 07/24/2020 0400   NA 130 (L) 07/09/2020 1508   K 3.4 (L) 07/24/2020 0400   CL 96 (L) 07/24/2020 0400   CO2 24 07/24/2020 0400   GLUCOSE 115 (H) 07/24/2020 0400   BUN 20 07/24/2020 0400   BUN 16 07/09/2020 1508   CREATININE 0.74 07/24/2020 0400   CALCIUM 7.6 (L) 07/24/2020 0400   PROT 6.6 07/17/2020 0920   PROT 6.6 07/09/2020 1508   ALBUMIN 3.8 07/17/2020 0920  ALBUMIN 4.4 07/09/2020 1508   AST 18 07/17/2020 0920   ALT 22 07/17/2020 0920   ALKPHOS 54 07/17/2020 0920   BILITOT 0.7 07/17/2020 0920   BILITOT 0.4 07/09/2020 1508   GFRNONAA >60 07/24/2020 0400   GFRAA >60 07/24/2020 0400       Component Value Date/Time   WBC 14.0 (H) 07/23/2020 0645   RBC 3.78 (L) 07/23/2020 0645   HGB 11.9 (L) 07/23/2020 0645   HGB 13.5 06/18/2020 1056   HCT 35.1 (L) 07/23/2020 0645   HCT 38.6 06/18/2020 1056   PLT 136 (L) 07/23/2020 0645   PLT 243 06/18/2020 1056   MCV 92.9 07/23/2020 0645   MCV 90 06/18/2020 1056   MCH 31.5 07/23/2020 0645   MCHC 33.9 07/23/2020 0645   RDW 13.7 07/23/2020 0645   RDW 13.0 06/18/2020 1056   LYMPHSABS 1.7 06/18/2020 1056   EOSABS 0.4 06/18/2020 1056   BASOSABS 0.0 06/18/2020 1056   I will  No results found for: POCLITH, LITHIUM   No results found for: PHENYTOIN, PHENOBARB, VALPROATE, [place]     Office Visit from 06/04/2020 in [facility] 22       .res Assessment: Plan:    Zahi was seen today for follow-up, anxiety, depression and medication problem.  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   There are also elements of OCD.   His paranoid is improved.  General anxiety with some intrusive thoughts remain but Depression resolved.  His son has schizophrenia so there may be a genetic predisposition to psychosis.  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        He has benefited with reduction in anxiety from the use of sertraline .  Again his depression is better with the sertraline.  Less shame but that may be mild paranoia or anxiety and not depression. He wants to reduce sertraline to 50 daily.  He says he's less depressed and it has helped his anxiety. Disc the risk of increased depression and anxiety but since 3 mos out from surgery OK.  Disc SE in detail and SSRI withdrawal sx.    If psychosis worsens he will need a med that is both generic and also have a low risk of worsening tardive dyskinesia.  For cost reasons and low EPS would consider use loxapine if needed.  Another option would be quetiapine with low EPS but is fairly sedating not sure he can tolerate it.  Consider Latuda but it has a fair EPS risk as well.  However it might also help his depression allowing for less sertraline.  We will hold off starting any new antipsychotic as long as possible.    Lorazepam DC'd and doing OK  Try to stop hydroxyzine if possible and do not renew it if he can sleep okay without it.  No new antipsychotics started today. Continue [name]  18 mg twice daily.  FU 3-4 mos  Lynder Parents, MD, DFAPA     Future Appointments  Date Time Provider [place]  10/25/2020 11:15 AM Pixie Casino, MD [facility]  07/21/2021 11:00 AM Roxy Manns,  Valentina Gu, MD [facility]    No orders of the defined types were placed in this encounter.     -------------------------------

## 2020-10-25 ENCOUNTER — Other Ambulatory Visit: Payer: Self-pay

## 2020-10-25 ENCOUNTER — Encounter: Payer: Self-pay | Admitting: Internal Medicine

## 2020-10-25 ENCOUNTER — Ambulatory Visit: Payer: Medicare HMO | Admitting: Internal Medicine

## 2020-10-25 VITALS — BP 100/60 | HR 57 | Ht 65.0 in | Wt 128.6 lb

## 2020-10-25 DIAGNOSIS — E785 Hyperlipidemia, unspecified: Secondary | ICD-10-CM

## 2020-10-25 DIAGNOSIS — Z951 Presence of aortocoronary bypass graft: Secondary | ICD-10-CM

## 2020-10-25 DIAGNOSIS — Z953 Presence of xenogenic heart valve: Secondary | ICD-10-CM | POA: Diagnosis not present

## 2020-10-25 MED ORDER — CLOPIDOGREL BISULFATE 75 MG PO TABS
75.0000 mg | ORAL_TABLET | Freq: Every day | ORAL | 3 refills | Status: DC
Start: 2020-10-25 — End: 2021-10-08

## 2020-10-25 MED ORDER — METOPROLOL TARTRATE 25 MG PO TABS
25.0000 mg | ORAL_TABLET | Freq: Two times a day (BID) | ORAL | 3 refills | Status: DC
Start: 2020-10-25 — End: 2021-10-06

## 2020-10-25 MED ORDER — ROSUVASTATIN CALCIUM 10 MG PO TABS
10.0000 mg | ORAL_TABLET | Freq: Every day | ORAL | 3 refills | Status: DC
Start: 2020-10-25 — End: 2020-11-01

## 2020-10-25 NOTE — Patient Instructions (Signed)
Medication Instructions:  Your physician recommends that you continue on your current medications as directed. Please refer to the Current Medication list given to you today.  *If you need a refill on your cardiac medications before your next appointment, please call your pharmacy*   Lab Work: FASTING lipid panel within the next 1-2 weeks  If you have labs (blood work) drawn today and your tests are completely normal, you will receive your results only by: Marland Kitchen MyChart Message (if you have MyChart) OR . A paper copy in the mail If you have any lab test that is abnormal or we need to change your treatment, we will call you to review the results.   Testing/Procedures: Echocardiogram @ [address] 3rd Floor  -- schedule for 6 months   Follow-Up: At [facility], you and your health needs are our priority.  As part of our continuing mission to provide you with exceptional heart care, we have created designated Provider Care Teams.  These Care Teams include your primary Cardiologist (physician) and Advanced Practice Providers (APPs -  Physician Assistants and Nurse Practitioners) who all work together to provide you with the care you need, when you need it.  We recommend signing up for the patient portal called "MyChart".  Sign up information is provided on this After Visit Summary.  MyChart is used to connect with patients for Virtual Visits (Telemedicine).  Patients are able to view lab/test results, encounter notes, upcoming appointments, etc.  Non-urgent messages can be sent to your provider as well.   To learn more about what you can do with MyChart, go to [email or web address].    Your next appointment:   6 month(s) - after echo  The format for your next appointment:   In Person  Provider:   You may see Dr. Debara Pickett or one of the following Advanced Practice Providers on your designated Care Team:    Almyra Deforest, PA-C  Fabian Sharp, [place] or   Roby Lofts,  [place]    Other Instructions

## 2020-10-25 NOTE — Progress Notes (Signed)
OFFICE CONSULT NOTE  Chief Complaint:  Follow-up severe aortic stenosis  Primary Care Physician: Neil Contras, MD  HPI:  Neil [name] is a 79 y.o. male who is being seen today for the evaluation of severe aortic stenosis at the request of Neil Contras, MD.  This is a pleasant 79 year old male kindly referred for evaluation and management of severe aortic stenosis.  Apparently he has been followed for an abnormal heart murmur which she says he is had for a long time, possibly since he was a child.  He recalls having had an echo with [facility] about 5 or 6 years ago and was told that he had aortic valve disease but clearly has not had follow-up closely until recently.  His PCP noted a change in the quality of his murmur and a repeat echo was ordered.  This was performed at [facility] with outside reading, and I personally reviewed the report but did not have access to the images.  This demonstrated an LVEF of 60 to 65% with mild LVH, normal left atrial size, moderate calcification of the mitral valve leaflets and minimal mitral regurgitation without mitral stenosis.  The aortic valve was moderately calcified and there was severe aortic stenosis.  The mean gradient across the aortic valve was 64 mmHg and a peak gradient of 90 mmHg.  The aortic root diameter was normal.  This was discussed with the patient and he was felt to be asymptomatic, flatly denying any chest pain, worsening shortness of breath with exertion, presyncope or syncopal episodes.  10/25/2020  Neil [name] returns today for follow-up.  This is the first time I have seen him since his initial office visit which time he was found to have critical aortic stenosis and sent fairly urgently for cardiac catheterization.  He subsequently underwent bioprosthetic AVR with Edwards Inspriris Resilia pericardial tissue valve (21 mm) and aortic root enlargement using bovine pericardial patch as well as CABG x4 (LIMA to LAD, SVG to distal right coronary,  SVG to OM1 and sequential SVG to OM 3).  While this went well there was some postoperative bleeding and he was taken back to the operating room later for reexploration which demonstrated diffuse coagulopathy and bleeding from the chest wall. He was ultimately discharged.  In follow-up he was noted to have complaints of increasing lower extremity edema and dyspnea.  Chest x-ray was performed and demonstrated interval development of moderate left pleural effusion.  He subsequently underwent thoracentesis on the left on August 26 with marked improvement in his symptoms.  No recurrence was noted thereafter.  He was last seen by Dr. Roxy Manns on September 23, 2020 and felt to be doing well.  He had deferred management of several medications and a repeat echo to me.  This is following an echo postoperatively which showed some reduction in LVEF down to 45 to 50% with incoordinate septal motion.  Is possible this degree of heart failure may have been related to his postoperative pleural effusion.  His wife reports he has been doing well with physical therapy.  He is now walking without a walker.  The only other concern was that he had low blood pressure.  His PCP took him off of his lisinopril due to this.  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   LIMA to LAD, Sequential SVG to OM1-OM3, SVG to RCA, EVH via left thigh   Schizophrenia ([place])    Severe aortic stenosis     Past Surgical History:  Procedure Laterality Date   AORTIC ROOT ENLARGEMENT N/A 07/19/2020   Procedure: AORTIC ROOT ENLARGEMENT USING 6CM x 8CM PERI-GUARD BOVINE PERICARDIUM PATCH;  Surgeon: Rexene Alberts, MD;  Location: [place];  Service: Open Heart Surgery;  Laterality: N/A;   AORTIC VALVE  REPLACEMENT N/A 07/19/2020   Procedure: AORTIC VALVE REPLACEMENT (AVR) USING INSPIRIS RESILIA 21MM AORTIC VALVE;  Surgeon: Rexene Alberts, MD;  Location: [place];  Service: Open Heart Surgery;  Laterality: N/A;   CORONARY ARTERY BYPASS GRAFT N/A 07/19/2020   Procedure: CORONARY ARTERY BYPASS GRAFTING (CABG), ON PUMP, TIMES FOUR, USING LEFT INTERNAL MAMMARY ARTERY AND ENDOSCOPICALLY HARVESTED LEFT GREATER SAPHENOUS VEIN;  Surgeon: Rexene Alberts, MD;  Location: [place];  Service: Open Heart Surgery;  Laterality: N/A;   EXPLORATION POST OPERATIVE OPEN HEART N/A 07/19/2020   Procedure: EXPLORATION POST OPERATIVE OPEN HEART;  Surgeon: Rexene Alberts, MD;  Location: [place];  Service: Open Heart Surgery;  Laterality: N/A;   EYE SURGERY     Bialteral lower eye lids   FOOT SURGERY Left    IR THORACENTESIS ASP PLEURAL SPACE W/IMG GUIDE  08/08/2020   LIPOMA EXCISION     RIGHT AND LEFT HEART CATH N/A 06/26/2020   Procedure: RIGHT AND LEFT HEART CATH;  Surgeon: Sherren Mocha, MD;  Location: [place] [facility];  Service: Cardiovascular;  Laterality: N/A;   TEE WITHOUT CARDIOVERSION N/A 07/19/2020   Procedure: TRANSESOPHAGEAL ECHOCARDIOGRAM (TEE);  Surgeon: Rexene Alberts, MD;  Location: [place];  Service: Open Heart Surgery;  Laterality: N/A;    FAMHx:  Family History  Problem Relation Age of Onset   Hypertension Mother    Heart attack Father    Hypertension Sister    Hypertension Brother     SOCHx:   reports that he has never smoked. He has never used smokeless tobacco. He reports that he does not drink alcohol and does not use drugs.  ALLERGIES:  Allergies  Allergen Reactions   Aspirin Swelling and Other (See Comments)    Tongue swelling    Sulfur Other (See Comments)    Reaction:  Unknown    Tetanus Toxoids Swelling and Other (See Comments)    Arm swelling    ROS: Pertinent items noted in HPI and remainder of comprehensive ROS otherwise negative.  HOME MEDS: Current  Outpatient Medications on File Prior to Visit  Medication Sig Dispense Refill   acetaminophen (TYLENOL) 500 MG tablet Take 500 mg by mouth daily.     [name] 9 MG TABS TAKE 2 TABLETS BY MOUTH IN THE MORNING AND TAKE 2 TABLETS BY MOUTH AT BEDTIME 120 tablet 1   clopidogrel (PLAVIX) 75 MG tablet Take 1 tablet (75 mg total) by mouth daily. 30 tablet 1   hydrOXYzine (ATARAX/VISTARIL) 25 MG tablet TAKE 1 TABLET BY MOUTH AT BEDTIME AS NEEDED 30 tablet 1   metoprolol tartrate (LOPRESSOR) 25 MG tablet Take 1 tablet (25 mg total) by mouth 2 (two) times daily. 60 tablet 1   rosuvastatin (CRESTOR) 10 MG tablet Take 1 tablet (10 mg total) by mouth daily. 30 tablet 1   sennosides-docusate sodium (SENOKOT-S) 8.6-50 MG tablet Take 1 tablet by mouth daily with supper.     sertraline (ZOLOFT)  50 MG tablet TAKE 1 AND 1/2 TABLETS DAILY BY MOUTH (Patient taking differently: Take 50 mg by mouth daily. Pt takes 1 tablet) 135 tablet 0   VITAMIN D PO Take 1 capsule by mouth daily.     Multiple Vitamin (MULTIVITAMIN WITH MINERALS) TABS tablet Take 2 tablets by mouth daily. 78 + (Patient not taking: Reported on 10/25/2020)     No current facility-administered medications on file prior to visit.    LABS/IMAGING: No results found for this or any previous visit (from the past 48 hour(s)). No results found.  LIPID PANEL: No results found for: CHOL, TRIG, HDL, CHOLHDL, VLDL, LDLCALC, LDLDIRECT  WEIGHTS: Wt Readings from Last 3 Encounters:  10/25/20 128 lb 9.6 oz (58.3 kg)  09/23/20 123 lb (55.8 kg)  08/26/20 119 lb (54 kg)    VITALS: BP 100/60 (BP Location: Left Arm, Patient Position: Sitting)    Pulse (!) 57    Ht 5\' 5"  (1.651 m)    Wt 128 lb 9.6 oz (58.3 kg)    SpO2 95%    BMI 21.40 kg/m   EXAM: General appearance: alert and no distress Neck: no carotid bruit, no JVD and thyroid not enlarged, symmetric, no tenderness/mass/nodules Lungs: clear to auscultation bilaterally Heart: regular rate and  rhythm, S1: normal, S2: decreased intensity and systolic murmur: systolic ejection 3/6, harsh at 2nd right intercostal space Abdomen: soft, non-tender; bowel sounds normal; no masses,  no organomegaly Extremities: extremities normal, atraumatic, no cyanosis or edema Pulses: 2+ and symmetric Skin: Skin color, texture, turgor normal. No rashes or lesions Neurologic: Grossly normal Psych: Pleasant  EKG: Sinus bradycardia 57, possible left atrial enlargement  ASSESSMENT: 1. Severe asymptomatic aortic stenosis-mean gradient 64 mmHg 2. Status post bioprosthetic AVR (Edwards Inspiris Resilia 21 mm), aortic root patch enlargment (07/2020) 3. Status post CABG x4 (LIMA to LAD, SVG to distal RCA, SVG to OM1 and sequential SVG to OM 3) - (07/2020) 4. Dyslipidemia  PLAN: 1.   Mr. Raden is recovering quite well with minimal postoperative complications.  He is echo after surgery for some reason showed a decline in LVEF down to 45 to 50% however he has had no anginal symptoms.  He did have a moderate sized left pleural effusion which was drained and there has been no recurrence.  He is starting to get his strength back and is completed rehabilitation.  Unfortunately could not tolerate lisinopril due to low blood pressure and currently is only on metoprolol, Plavix and statin.  He is due for repeat lipid assessment to titrate to a target LDL less than 70.  We will order that today.  I would like to consider repeat echo in 6 months and follow-up with me at that time.  Pixie Casino, MD, [facility], [place] Director of the [facility] Lipid Disorders &  Cardiovascular [facility] Reduction Clinic Diplomate of the American Board of Clinical Lipidology Attending Cardiologist  Direct Dial: [phone]   Fax: [phone]  Website:  [email or web address] [email or web address] 10/25/2020, 11:49 AM

## 2020-10-30 ENCOUNTER — Telehealth: Payer: Self-pay | Admitting: Psychiatry

## 2020-10-30 NOTE — Telephone Encounter (Signed)
Pt called and left a message and asked if dr. Clovis Pu about the medicine for the tongue. Please call him at [phone]

## 2020-10-31 ENCOUNTER — Other Ambulatory Visit: Payer: Self-pay | Admitting: Psychiatry

## 2020-10-31 DIAGNOSIS — G2401 Drug induced subacute dyskinesia: Secondary | ICD-10-CM

## 2020-10-31 DIAGNOSIS — E785 Hyperlipidemia, unspecified: Secondary | ICD-10-CM | POA: Diagnosis not present

## 2020-10-31 LAB — LIPID PANEL
Chol/HDL Ratio: 3.2 ratio (ref 0.0–5.0)
Cholesterol, Total: 172 mg/dL (ref 100–199)
HDL: 53 mg/dL (ref >39)
LDL Chol Calc (NIH): 102 mg/dL — ABNORMAL HIGH (ref 0–99)
Triglycerides: 91 mg/dL (ref 0–149)
VLDL Cholesterol Cal: 17 mg/dL (ref 5–40)

## 2020-10-31 MED ORDER — AUSTEDO 9 MG PO TABS: ORAL_TABLET | ORAL | 2 refills | Status: DC

## 2020-10-31 NOTE — Telephone Encounter (Signed)
Were you giving him samples?

## 2020-10-31 NOTE — Telephone Encounter (Signed)
I assume he is asking for refill of [name] which I sent to the pharmacy.

## 2020-11-01 ENCOUNTER — Other Ambulatory Visit: Payer: Self-pay | Admitting: Psychiatry

## 2020-11-01 ENCOUNTER — Other Ambulatory Visit: Payer: Self-pay | Admitting: *Deleted

## 2020-11-01 DIAGNOSIS — F5105 Insomnia due to other mental disorder: Secondary | ICD-10-CM

## 2020-11-01 DIAGNOSIS — E785 Hyperlipidemia, unspecified: Secondary | ICD-10-CM

## 2020-11-01 MED ORDER — ROSUVASTATIN CALCIUM 40 MG PO TABS
40.0000 mg | ORAL_TABLET | Freq: Every day | ORAL | 3 refills | Status: DC
Start: 2020-11-01 — End: 2021-02-04

## 2020-11-01 NOTE — Telephone Encounter (Signed)
Pt LM on VM reporting he's having more difficulty swallowing for past month. Asking if this may have anything to do with that med he takes for his tongue. Call @ [phone]

## 2020-11-02 NOTE — Telephone Encounter (Signed)
Please review message

## 2020-11-05 ENCOUNTER — Other Ambulatory Visit: Payer: Self-pay | Admitting: Psychiatry

## 2020-11-05 ENCOUNTER — Telehealth: Payer: Self-pay | Admitting: Psychiatry

## 2020-11-05 DIAGNOSIS — F5105 Insomnia due to other mental disorder: Secondary | ICD-10-CM

## 2020-11-05 MED ORDER — HYDROXYZINE HCL 10 MG PO TABS: 10.0000 mg | ORAL_TABLET | Freq: Every evening | ORAL | 1 refills | Status: DC | PRN

## 2020-11-05 NOTE — Telephone Encounter (Signed)
Sent RX hydroxyzine 10 mg prn in place of 25 mg size.

## 2020-11-05 NOTE — Telephone Encounter (Signed)
Neil [name] would like to get a RF of his hydroxyzine but he is cutting the 25mg  in half and that is working fine. He would like to get a lower dose called in if possible. Send to [place].

## 2020-11-08 ENCOUNTER — Other Ambulatory Visit: Payer: Self-pay | Admitting: Psychiatry

## 2020-11-08 DIAGNOSIS — F5105 Insomnia due to other mental disorder: Secondary | ICD-10-CM

## 2020-11-11 ENCOUNTER — Telehealth: Payer: Self-pay

## 2020-11-11 NOTE — Telephone Encounter (Signed)
Prior authorization submitted and approved for HYDROXYZINE 10 MG tabs 1 daily effective 12/15/2019-12/13/2020 with [facility] Part [facility]/[employer]

## 2020-11-26 ENCOUNTER — Other Ambulatory Visit: Payer: Self-pay | Admitting: Psychiatry

## 2020-11-26 DIAGNOSIS — F333 Major depressive disorder, recurrent, severe with psychotic symptoms: Secondary | ICD-10-CM

## 2020-11-26 DIAGNOSIS — F411 Generalized anxiety disorder: Secondary | ICD-10-CM

## 2021-01-03 ENCOUNTER — Other Ambulatory Visit: Payer: Self-pay | Admitting: Psychiatry

## 2021-01-03 DIAGNOSIS — F5105 Insomnia due to other mental disorder: Secondary | ICD-10-CM

## 2021-01-08 ENCOUNTER — Other Ambulatory Visit: Payer: Self-pay | Admitting: *Deleted

## 2021-01-08 DIAGNOSIS — E785 Hyperlipidemia, unspecified: Secondary | ICD-10-CM

## 2021-01-13 ENCOUNTER — Telehealth: Payer: Self-pay | Admitting: Internal Medicine

## 2021-01-13 NOTE — Telephone Encounter (Signed)
Spoke with patients wife who states that patient has not been eating more since having his heart surgery. Patients wife states that patient eats a very healthy and well balanced diet but recently for the last  2 months has been having issues getting full after eating. Patients wife states that patient has gained about 10 pounds in the last 5-6 months. Patient's wife states that patient feels great and denies any swelling or other symptoms at this time. Advised patients wife to contact patients PCP for recommendations related to this. Advised her that I would forward this message to Dr. Debara Pickett to see if he had any recommendations as well. Patients wife verbalized understanding.

## 2021-01-13 NOTE — Telephone Encounter (Signed)
We could recommend the [facility] if he is interested.  Dr Lemmie Evens

## 2021-01-13 NOTE — Telephone Encounter (Signed)
Pt wife called and would like to see if Nurse could give her a call about pt diet.  She stated he just eats and doesn't really get full.  Best number for her is  [phone]

## 2021-01-14 NOTE — Telephone Encounter (Signed)
Wife states patient has lost weight after surgery. She said he has steadily gained weight, could use to gain some more. He eats a well-balanced diet. She is not overly concerned about his weight/diet, but patient wanted her to mention this. Advised her of MD suggestion - not interested at this time

## 2021-01-23 DIAGNOSIS — E785 Hyperlipidemia, unspecified: Secondary | ICD-10-CM | POA: Diagnosis not present

## 2021-01-23 LAB — LIPID PANEL
Chol/HDL Ratio: 2.6 ratio (ref 0.0–5.0)
Cholesterol, Total: 148 mg/dL (ref 100–199)
HDL: 58 mg/dL (ref >39)
LDL Chol Calc (NIH): 79 mg/dL (ref 0–99)
Triglycerides: 50 mg/dL (ref 0–149)
VLDL Cholesterol Cal: 11 mg/dL (ref 5–40)

## 2021-02-04 ENCOUNTER — Telehealth: Payer: Self-pay | Admitting: Internal Medicine

## 2021-02-04 MED ORDER — ROSUVASTATIN CALCIUM 40 MG PO TABS
40.0000 mg | ORAL_TABLET | Freq: Every day | ORAL | 3 refills | Status: DC
Start: 2021-02-04 — End: 2021-10-24

## 2021-02-04 NOTE — Telephone Encounter (Signed)
Neil Casino, MD  01/24/2021 6:29 PM EST      Cholesterol further improved-LDL 79  Dr. Luciano Cutter to wife-aware and verbalized understanding. Needs new rx for crestor 40.  rx sent to pharmacy

## 2021-02-04 NOTE — Telephone Encounter (Signed)
Neil [name] is calling requesting Neil [name] results from his 01/23/21 labs. She also states she accidentally advised the pharmacy that [name] is no longer taking rosuvastatin out of error and it was deleted so they will be sending a refill request for that medication to be added back to his medication list. Please advise.

## 2021-02-18 DIAGNOSIS — Z951 Presence of aortocoronary bypass graft: Secondary | ICD-10-CM | POA: Diagnosis not present

## 2021-02-18 DIAGNOSIS — I2510 Atherosclerotic heart disease of native coronary artery without angina pectoris: Secondary | ICD-10-CM | POA: Diagnosis not present

## 2021-02-18 DIAGNOSIS — Z8349 Family history of other endocrine, nutritional and metabolic diseases: Secondary | ICD-10-CM | POA: Diagnosis not present

## 2021-02-18 DIAGNOSIS — G2401 Drug induced subacute dyskinesia: Secondary | ICD-10-CM | POA: Diagnosis not present

## 2021-02-18 DIAGNOSIS — E782 Mixed hyperlipidemia: Secondary | ICD-10-CM | POA: Diagnosis not present

## 2021-02-18 DIAGNOSIS — R69 Illness, unspecified: Secondary | ICD-10-CM | POA: Diagnosis not present

## 2021-02-18 DIAGNOSIS — R5383 Other fatigue: Secondary | ICD-10-CM | POA: Diagnosis not present

## 2021-02-20 ENCOUNTER — Telehealth: Payer: Self-pay

## 2021-02-20 DIAGNOSIS — Z8349 Family history of other endocrine, nutritional and metabolic diseases: Secondary | ICD-10-CM | POA: Diagnosis not present

## 2021-02-20 DIAGNOSIS — R5383 Other fatigue: Secondary | ICD-10-CM | POA: Diagnosis not present

## 2021-02-20 NOTE — Telephone Encounter (Signed)
Prior authorization for Deutetrabenazine ([name]) 9 MG TABS has been approved by [employer]. Effective Dates: 12/14/2020-12/13/2021

## 2021-02-24 ENCOUNTER — Other Ambulatory Visit: Payer: Self-pay | Admitting: Psychiatry

## 2021-02-24 DIAGNOSIS — F411 Generalized anxiety disorder: Secondary | ICD-10-CM

## 2021-02-24 DIAGNOSIS — F333 Major depressive disorder, recurrent, severe with psychotic symptoms: Secondary | ICD-10-CM

## 2021-02-24 NOTE — Telephone Encounter (Signed)
Please review

## 2021-02-28 ENCOUNTER — Other Ambulatory Visit: Payer: Self-pay | Admitting: Psychiatry

## 2021-02-28 DIAGNOSIS — F5105 Insomnia due to other mental disorder: Secondary | ICD-10-CM

## 2021-03-06 ENCOUNTER — Telehealth: Payer: Self-pay | Admitting: Psychiatry

## 2021-03-06 NOTE — Telephone Encounter (Signed)
The patient and his wife are elderly.  He seems to forget the purpose of the medication and have some difficulty comprehending the conditions for which he is treated as well as the purpose of the meds and side effect issues. He is also somewhat impatient and anxious wanting changes to be made quickly. The answer to the question is the [name] is to treat the symptoms of his tongue movements.  If he stops the [name] the tongue movements will be worse.  The [name] is not causing the tongue movements.  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 He has an appointment with me in 3 weeks.  I do not want to make any med changes over the phone or even med adjustments.  I would urge him to come into the office at that visit.  I do not do not want a televisit.  I want to be able to see him so that I can observe his tongue movements.

## 2021-03-06 NOTE — Telephone Encounter (Signed)
Please review

## 2021-03-06 NOTE — Telephone Encounter (Signed)
I will call the [facility]

## 2021-03-06 NOTE — Telephone Encounter (Signed)
Rtc to Mrs. Doyon and discussed situation, she hates seeing Jorryn tongue move so much. She didn't think anything could be changed right now. Informed her he needs to come in office to apt on 4/13 if possible. She reports they have tried to be cautious due to [facility] heart surgery 6 months ago. She reports she will try to get him to office. Instructed her to call office a couple days ahead and let staff know.  She agreed and was appreciative of call.

## 2021-03-06 NOTE — Telephone Encounter (Signed)
Neil [name]'s wife, Neil [name] called about the [name] he is taking. He is taking two 36 mg daily. She has noticed that he still continues to be very nervous and said that his tongue doesn't stop moving. Are these side effects from the [name]? Her phone number is [phone].

## 2021-03-07 ENCOUNTER — Other Ambulatory Visit: Payer: Self-pay | Admitting: Psychiatry

## 2021-03-07 DIAGNOSIS — G2401 Drug induced subacute dyskinesia: Secondary | ICD-10-CM

## 2021-03-13 DIAGNOSIS — H903 Sensorineural hearing loss, bilateral: Secondary | ICD-10-CM | POA: Diagnosis not present

## 2021-03-22 ENCOUNTER — Other Ambulatory Visit: Payer: Self-pay | Admitting: Psychiatry

## 2021-03-22 DIAGNOSIS — F333 Major depressive disorder, recurrent, severe with psychotic symptoms: Secondary | ICD-10-CM

## 2021-03-22 DIAGNOSIS — F411 Generalized anxiety disorder: Secondary | ICD-10-CM

## 2021-03-24 NOTE — Telephone Encounter (Signed)
Has apt 4/13

## 2021-03-26 ENCOUNTER — Encounter: Payer: Self-pay | Admitting: Psychiatry

## 2021-03-26 ENCOUNTER — Other Ambulatory Visit: Payer: Self-pay

## 2021-03-26 ENCOUNTER — Ambulatory Visit (INDEPENDENT_AMBULATORY_CARE_PROVIDER_SITE_OTHER): Payer: Medicare HMO | Admitting: Psychiatry

## 2021-03-26 DIAGNOSIS — F251 Schizoaffective disorder, depressive type: Secondary | ICD-10-CM

## 2021-03-26 DIAGNOSIS — F5105 Insomnia due to other mental disorder: Secondary | ICD-10-CM | POA: Diagnosis not present

## 2021-03-26 DIAGNOSIS — F061 Catatonic disorder due to known physiological condition: Secondary | ICD-10-CM

## 2021-03-26 DIAGNOSIS — G2401 Drug induced subacute dyskinesia: Secondary | ICD-10-CM | POA: Diagnosis not present

## 2021-03-26 DIAGNOSIS — R69 Illness, unspecified: Secondary | ICD-10-CM | POA: Diagnosis not present

## 2021-03-26 DIAGNOSIS — R5383 Other fatigue: Secondary | ICD-10-CM

## 2021-03-26 DIAGNOSIS — F411 Generalized anxiety disorder: Secondary | ICD-10-CM

## 2021-03-26 MED ORDER — HYDROXYZINE HCL 10 MG PO TABS: 10.0000 mg | ORAL_TABLET | Freq: Every evening | ORAL | 0 refills | Status: DC | PRN

## 2021-03-26 NOTE — Progress Notes (Signed)
TRIPTON NED [id number] [birth year] 80 y.o.   Video Visit via My Chart  I connected with pt by My Chart and verified that I am speaking with the correct person using two identifiers.   I discussed the limitations, risks, security and privacy concerns of performing an evaluation and management service by My Chart  and the availability of in person appointments. I also discussed with the patient that there may be a patient responsible charge related to this service. The patient expressed understanding and agreed to proceed.  I discussed the assessment and treatment plan with the patient. The patient was provided an opportunity to ask questions and all were answered. The patient agreed with the plan and demonstrated an understanding of the instructions.   The patient was advised to call back or seek an in-person evaluation if the symptoms worsen or if the condition fails to improve as anticipated.  I provided 30 minutes of video time during this encounter.  The patient was located at home and the provider was located office. Call started at 4 and ended 430  Subjective:   Patient ID:  GUILLAUME WENINGER is a 80 y.o. (DOB Aug 07, 1941) male.  Chief Complaint:  Chief Complaint  Patient presents with  . 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 Follow-up  . Medication Problem  . Anxiety    Medication Refill Associated symptoms include fatigue. Pertinent negatives include no weakness.  Anxiety Symptoms include decreased concentration and nervous/anxious behavior. Patient reports no confusion or dizziness.    Depression        Associated symptoms include decreased concentration and fatigue.  Associated symptoms include no appetite change.  Past medical history includes anxiety.     RENO CLASBY presents today for follow-up of psychosis, depression, and anxiety.  He has required an urgent appointment because of wanting further med changes and frequent phone calls with  med changes since he was last here  When seen September 3 he was switched off olanzapine because of EPS and switched on to Saphris which was raised to 5 mg nightly but only took that dose 1 time.  The rest of the time he took only 2.5 mg nightly.   He called back 5 days later stating he wanted to stop it because it was causing him to hold his mouth open and he was having a worsening time with tongue movements and felt "funny".  He was encouraged to give it more time but he would not agree to do so.  He stopped it Friday 9/11 and still felt the funny tense feeling.    When  seen September 01, 2019.  The decision was made to switch from [place] to [place] because of difficulty getting an adequate response and limited side effects from [place].  We also discussed the antipsychotic and the plan was to start [place] but those samples were not available so he decided to restart Saphris 2.5 mg nightly.  At the visit September 15, 2011 [name] was increased to 12 mg twice daily, Saphris was discontinued and Caplyta 42 mg daily was started because of his low EPS risk.  He still needed an antipsychotic.  He continued on sertraline 75 mg daily because he felt it helped his depression.  Wife called 10/8 and said the following: On Caplyta,  Thinks it is too strong.  His movements are slow, sleeping too much, drowsy during day ( doesn't really wake up until 4pm after sleeping all night). Takes him along time to  think and respond. Had trouble with mouth and jaw before, and now he is holding his mouth open all the time.  Paranoia is better though. Was told to take it 3 days weekly.    October 16 [name] increased to 18 mg BID  Called again November 9 stating he couldn't tolerate either med.  He stopped the Caplyta and reduced the [name] to 9 mg BID a few days ago.  CO rigidity in lower back and arms.  Still bothered by constant tongue movements.  No change in worry, mood but paranoia is reduced since Caplyta.    visit October 25, 2019.  He had stopped Caplyta.  He had also reduced [name] to 9 mg twice daily on his own.  He has been making frequent med changes [place] which is made it extremely difficult to get an antipsychotic to deal with his psychotic symptoms as well as adjust the [name] today with tardive dyskinesia symptoms.  He is very impatient with med changes.  Therefore we did not start a new antipsychotic at the last visit.  He tends to make frequent phone calls and did call again on November 23 complaining of feeling very restless and difficulty sleeping.  Was prescribed clonazepam 0.5 mg twice daily.  He called back the next day stating he wanted to stop the [name] due to restlessness but he had never taken the prescribed clonazepam.  Using courage to follow the treatment plan and take the clonazepam.  He called again the next day stating he still wanted to stop [name] and the clonazepam was making his gait unsteady.  He was informed that if he stopped the [name] his mouth movements would get worse which is been a leading complaint but he could stop it if he wanted to.  December 4 he reports clonazepam has helped the anxiety and less guilt and less rigidity.  Taking TID and only SE is a little slower movements.  1 fall, slipped on Ba floor where water was lying.  No injury.   Gained 3# and better appetite for the first time in a long time.  CO tongue movements.  Restlessness is now bearable.  Less anxiety overall.  No AH.    visit December 4.  His psychosis was not severe and the decision was made not to start another antipsychotic but instead to try to optimize treatment for tardive dyskinesia and [name] was increased to 12 mg twice daily. He called back immediately after the head visit admitting he had given false information during the visit and that he not actually been taking the [name] in the way he told me during the visit making it necessary to change the [name] to 9 mg  twice daily.  He was confronted about his noncompliance and warned that he may be discharged from the practice if he continued to be misleading and noncompliant with the physician.  This is not only compromising care but it is increasing his own risk because he is giving false information.   visit December 21, 2019.  Because of his complaints about balance issues clonazepam was reduced and spread out to 0.25 mg 4 times daily.  Also for tardive dyskinesia symptoms [name] was increased to 12 mg twice daily.  Prior to that visit he admitted being generally noncompliant with the [name] making it difficult to judge what dose to prescribe.  He was continued on sertraline 75 mg daily as he felt that was helpful for his anxiety. Says he's compliant with med  changes last visit.  Tongue movements are better but not gone. No problems with the [name].  Stiffness comes and goes and located in head and eyes.  Denies it's vertigo.  Intensity is less.  If shuts his eyes it goes right away.  visit January 04, 2020.  [name] remained 12 mg twice daily and sertraline 75 mg daily.  The following was changed: switch to lorazepam bc tiredness and balance problems to Ativan. Yes DC clonazepam and start lorazepam 0.5 mg 4 times daily  seen February 8 the patient reports the following.  Yes it did help.  Not as slowed and balance is good now.  Still feels tired with tendency to want to lay down and knows he needs to work on this.  Anxiety is pretty fair.  We decided to reduce the lorazepam to 0.5 mg 3 times daily in hopes of improving energy and balance further.  No other meds were changed.  He is still not on an antipsychotic.  Anxiety comes and goes but sometimes able to get by with TID lorazepam and energy is better.  Wife says sometimes gets uptight and can't relax so needs QID.Pt reports that mood is anxiety aand not much depression and describes anxiety as worry. Anxiety symptoms include: worry not panic and moderate.    Sleep is better usually with hydroxyzine and sometimes lorazepam. Pt reports that appetite is Much better with weight up to 131# as low as 120#.  Normal was 140#.Marland Kitchen Pt reports that energy is better and OK. Concentration is fair.. Suicidal thoughts:  None. Denies sig paranoid thought.  Plan: No med change except okay to use hydroxyzine 25 mg nightly for insomnia.  Per usual the patient tends to make multiple phone calls between appointments.  He is called about 7 times since March.  06/04/2020 appointment with the following noted: Seen with his wife. Reports hydroxyzine 25 mg is not helpful for sleep. Still moving tongue. No SE meds currently. Not depressed.  Is anxious persistent in the back ground and not even connected to thoughts. Little intrusive thougts.   Sleep concerns, to bed 830 an not sure when goes to sleep.  Sometimes can't go to sleep. Get OOB about 9 AM.  Usually goes  back to bed.  CO weakness for 18 mos. Needs Aortic valve replacement. He tried reducing lorazepam to 0.5 mg TID and had a lot of restlessness but some improvement in energy.  Wonders if fatigue related to meds or heart valve problem.  Probably will have heart surgery. Plan: Try to reduce lorazepam to 3 and 1/2 tablets daily.  to see if energy is better.  Keep the lowest effective dose possible.   Do not exceed 4 Ativan a day. No new antipsychotics started today. Increase [name] for 1 week to 1 of 6 mg and 1 of the 9 mg tablets twice daily for 1 week, Then increase again to 12 mg tablets 1 and 1/2 tablets twice daily or 18 mg twice daily. Continue sertraline 75 mg daily for anxiety.  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  Recovering slowly.  Just started walking a week or 2 ago. Tor Netters handles the meds. [name] doesn't control tongue movements but it helps. Done fine without lorazepam and less drowsy per wife. Taking hydroxyzine 25 mg for sleep with Tylenol.  Sleeping well per  wife.   Wife thinks overall he's doing well from mental health perspective and seems calmer.  Pt says the psychosis has lightened  up and he's grateful for that.  No depression.   Wants to try to stop hydroxyzine. Wants to try to reduce sertraline to 50 mg to prevent cutting.   Plan: He wants to reduce sertraline to 50 daily.  He says he's less depressed and it has helped his anxiety. Disc the risk of increased depression and anxiety but since 3 mos out from surgery OK. DC hydroxyzine if possible.  11/05/2020 patient called for refill of hydroxyzine and was taking 12.5 mg.  Dosage reduced to 10 mg.  02/14/2021 phone call from his wife Vaughan Basta concerned about his tongue movements and his anxiety. MD response: The patient and his wife are elderly.  He seems to forget the purpose of the medication and have some difficulty comprehending the conditions for which he is treated as well as the purpose of the meds and side effect issues. He is also somewhat impatient and anxious wanting changes to be made quickly. The answer to the question is the [name] is to treat the symptoms of his tongue movements.  If he stops the [name] the tongue movements will be worse.  The [name] is not causing the tongue movements.  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 He has an appointment with me in 3 weeks.  I do not want to make any med changes over the phone or even med adjustments.  I would urge him to come into the office at that visit.  I do not do not want a televisit.  I want to be able to see him so that I can observe his tongue movements.  03/26/2021 appointment with following noted: Taking [name] 18 mg BID. Claims compliance. Started to feel more fatigued and went to See Dr. Antony Contras 02/25/21 with normal labs including: Comprehensive metabolic panel normal except sodium 132, normal CBC, TSH 2.24, B12 538 normal Fatigue is some better 20%.  Wants to get back in bed after breakfast.  For unclear  reasons hydroxyzine raised again to 25 mg nightly. Some initial insomnia and some awakening.  Nap 1 1/2 hour in AM and another hour in afternoon. Goes to bed 730PM and up 8 AM. Tongue movements persisted until 2 weeks ago and has gotten better. Reduced sertraline to 50 mg and anxiety is no worse. Not depressed. Reads about sE [name] and wonders if it is causing fatigue.   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  Son [name] living with them.  Become more committed [place].  He has schizophrenia.   Past Psychiatric Medication Trials: Mirtazapine,  olanzapine 5 EPS, perphenazine started TD with mouth px,  Saphris 5 mg and stopped due to EPS complaints and feeling "funny" Caplyta side effects risperidone 2 tremors  Buspirone,  amitriptyline 25,   Hydroxyzine 10-20 Clonazepam 0.5 TID dizzy and sleepy Ativan 0.5 mg QID Ingrezza and  [name] 12 mg BID  Review of Systems:  Review of Systems  Constitutional: Positive for fatigue. Negative for appetite change.  Gastrointestinal: Negative for constipation.  Neurological: Negative for dizziness, tremors, syncope and weakness.       No falls But complain of poor balance but not "bad"  Psychiatric/Behavioral: Positive for decreased concentration, depression and dysphoric mood. Negative for agitation, behavioral problems, confusion, hallucinations, self-injury and sleep disturbance. The patient is nervous/anxious. The patient is not hyperactive.   CABG 07/19/20  Medications: I have reviewed the patient's current medications.  Current Outpatient Medications  Medication Sig Dispense Refill  . acetaminophen (TYLENOL) 500 MG tablet Take 500 mg  by mouth daily.    . clopidogrel (PLAVIX) 75 MG tablet Take 1 tablet (75 mg total) by mouth daily. 90 tablet 3  . Deutetrabenazine ([name]) 9 MG TABS TAKE 2 TABLETS BY MOUTH EVERY MORNING AND TAKE 2 TABLETS BY MOUTH AT BEDTIME 120 tablet 2  . metoprolol tartrate (LOPRESSOR) 25 MG tablet Take 1 tablet  (25 mg total) by mouth 2 (two) times daily. 180 tablet 3  . Multiple Vitamin (MULTIVITAMIN WITH MINERALS) TABS tablet Take 2 tablets by mouth daily. 50 +    . rosuvastatin (CRESTOR) 40 MG tablet Take 1 tablet (40 mg total) by mouth daily. 90 tablet 3  . sertraline (ZOLOFT) 50 MG tablet TAKE 1 AND 1/2 TABLETS DAILY BY MOUTH (Patient taking differently: Take 50 mg by mouth daily.) 45 tablet 0  . VITAMIN D PO Take 1 capsule by mouth daily.    . hydrOXYzine (ATARAX/VISTARIL) 10 MG tablet Take 1 tablet (10 mg total) by mouth at bedtime as needed. 30 tablet 0  . sennosides-docusate sodium (SENOKOT-S) 8.6-50 MG tablet Take 1 tablet by mouth daily with supper.     No current facility-administered medications for this visit.    Medication Side Effects: None  Allergies:  Allergies  Allergen Reactions  . Aspirin Swelling and Other (See Comments)    Tongue swelling   . Elemental Sulfur Other (See Comments)    Reaction:  Unknown   . Tetanus Toxoids Swelling and Other (See Comments)    Arm swelling    Past Medical History:  Diagnosis Date  . Anxiety   . CAD (coronary artery disease)   . Depression   . Dyslipidemia   . 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 Schizophrenia ([place])   . Severe aortic stenosis     Family History  Problem Relation Age of Onset  . Hypertension Mother   . Heart attack Father   . Hypertension Sister   . Hypertension Brother     Social History   Socioeconomic History  . Marital status: Married    Spouse name: Not on file  . Number of children: Not on file  . Years of education: Not on file  . Highest education level: Not on file  Occupational History  . Not on file  Tobacco Use  . Smoking status: Never Smoker  . Smokeless tobacco: Never Used  Vaping Use   . Vaping Use: Never used  Substance and Sexual Activity  . Alcohol use: No  . Drug use: Never  . Sexual activity: Not on file  Other Topics Concern  . Not on file  Social History Narrative  . Not on file   Social Determinants of Health   Financial Resource Strain: Not on file  Food Insecurity: Not on file  Transportation Needs: Not on file  Physical Activity: Not on file  Stress: Not on file  Social Connections: Not on file  Intimate Partner Violence: Not on file    Past Medical History, Surgical history, Social history, and Family history were reviewed and updated as appropriate.   Please see review of systems for further details on the patient's review from today.   Objective:   Physical Exam:  There were no vitals taken for this visit.  Physical Exam Neurological:  Mental Status: He is alert and oriented to person, place, and time.     Cranial Nerves: No dysarthria.     Comments: Mild tongue and buccal movements  Psychiatric:        Attention and Perception: Attention and perception normal. He does not perceive auditory or visual hallucinations.        Mood and Affect: Mood is anxious. Mood is not depressed.        Speech: Speech normal.        Behavior: Behavior is slowed.        Thought Content: Thought content normal. Thought content is not paranoid or delusional. Thought content does not include homicidal or suicidal ideation. Thought content does not include homicidal or suicidal plan.        Cognition and Memory: Cognition and memory normal.        Judgment: Judgment normal.     Comments: Insight intact Less slowed.     Lab Review:     Component Value Date/Time   NA 132 (L) 07/24/2020 0400   NA 130 (L) 07/09/2020 1508   K 3.4 (L) 07/24/2020 0400   CL 96 (L) 07/24/2020 0400   CO2 24 07/24/2020 0400   GLUCOSE 115 (H) 07/24/2020 0400   BUN 20 07/24/2020 0400   BUN 16 07/09/2020 1508   CREATININE 0.74 07/24/2020 0400   CALCIUM 7.6 (L) 07/24/2020  0400   PROT 6.6 07/17/2020 0920   PROT 6.6 07/09/2020 1508   ALBUMIN 3.8 07/17/2020 0920   ALBUMIN 4.4 07/09/2020 1508   AST 18 07/17/2020 0920   ALT 22 07/17/2020 0920   ALKPHOS 54 07/17/2020 0920   BILITOT 0.7 07/17/2020 0920   BILITOT 0.4 07/09/2020 1508   GFRNONAA >60 07/24/2020 0400   GFRAA >60 07/24/2020 0400       Component Value Date/Time   WBC 14.0 (H) 07/23/2020 0645   RBC 3.78 (L) 07/23/2020 0645   HGB 11.9 (L) 07/23/2020 0645   HGB 13.5 06/18/2020 1056   HCT 35.1 (L) 07/23/2020 0645   HCT 38.6 06/18/2020 1056   PLT 136 (L) 07/23/2020 0645   PLT 243 06/18/2020 1056   MCV 92.9 07/23/2020 0645   MCV 90 06/18/2020 1056   MCH 31.5 07/23/2020 0645   MCHC 33.9 07/23/2020 0645   RDW 13.7 07/23/2020 0645   RDW 13.0 06/18/2020 1056   LYMPHSABS 1.7 06/18/2020 1056   EOSABS 0.4 06/18/2020 1056   BASOSABS 0.0 06/18/2020 1056   I will  No results found for: POCLITH, LITHIUM   No results found for: PHENYTOIN, PHENOBARB, VALPROATE, CBMZ   AIMS   [place] Visit from 06/04/2020 in [facility] Total Score 22       .res Assessment: Plan:    Xyon was seen today for schizoaffective disorder, depressive type without good prog, follow-up, medication problem and anxiety.  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  Fatigue, unspecified type     We discussed Patient has history of severe recurrent major depression with psychotic features.  There are also elements of OCD.   His paranoid is improved.  General anxiety with some intrusive thoughts remain but Depression resolved.  His son has schizophrenia  so there may be a genetic predisposition  to psychosis.  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        He has benefited with reduction in anxiety from the use of sertraline .  Again his depression is better with the sertraline.  Less shame but that may be mild paranoia or anxiety and not depression. OK with sertraline 50.    Disc SE in detail and SSRI withdrawal sx.    If psychosis worsens he will need a med that is both generic and also have a low risk of worsening tardive dyskinesia.  For cost reasons and low EPS would consider use loxapine if needed.  Another option would be quetiapine with low EPS but is fairly sedating not sure he can tolerate it.  Consider Latuda but it has a fair EPS risk as well.  However it might also help his depression allowing for less sertraline.  We will hold off starting any new antipsychotic as long as possible.    To address fatigue and cognition: Reduce hydroxyzine again to 10 mg and try to stop it. Stop hydroxyzine 25 mg at night Start hydroxyzine 10 mg tablets 1 at night Start Dayvigo 5 mg tablet 1 at night using samples.  If it helps then stop the hydroxyzine to help your fatigue and memory Spending way too much time in bed.  No new antipsychotics started today. Continue [name]  18 mg twice daily.  TD is better today.  ? reason  FU 3-4 mos  Lynder Parents, MD, DFAPA     Future Appointments  Date Time Provider [place]  04/30/2021 11:15 AM [facility] 3 [id number]  07/21/2021 11:00 AM Rexene Alberts, MD [facility]    No orders of the defined types were placed in this encounter.     -------------------------------

## 2021-03-26 NOTE — Patient Instructions (Addendum)
Stop hydroxyzine 25 mg at night Start hydroxyzine 10 mg tablets 1 at night Start Dayvigo 5 mg tablet 1 at night using samples.  If it helps then stop the hydroxyzine to help your fatigue and memory  Spend less time in bed

## 2021-03-31 ENCOUNTER — Telehealth: Payer: Self-pay | Admitting: Psychiatry

## 2021-03-31 NOTE — Telephone Encounter (Signed)
I spoke to his wife.She wants to know where to go from here since he can not afford it.She wanted me to mention he was very fatigued and drowsy on his previous medication which is the hydroxyzine.

## 2021-03-31 NOTE — Telephone Encounter (Signed)
There is nothing I can do about the cost.  He cannot take the med if insurance does not cover it better

## 2021-03-31 NOTE — Telephone Encounter (Signed)
Please review

## 2021-03-31 NOTE — Telephone Encounter (Signed)
[name]) called in with concerns about cost of Dayvigo 5mg . States they were giving samples last week and that their insurance will not cover cost for Dayvigo and they would have to pay around $300. She would like a CB to discuss options. Appt 7/13. Pls CB [phone]

## 2021-04-02 ENCOUNTER — Other Ambulatory Visit: Payer: Self-pay | Admitting: Psychiatry

## 2021-04-02 ENCOUNTER — Telehealth: Payer: Self-pay | Admitting: Psychiatry

## 2021-04-02 MED ORDER — BELSOMRA 15 MG PO TABS: 1.0000 | ORAL_TABLET | Freq: Every evening | ORAL | 0 refills | Status: DC

## 2021-04-02 MED ORDER — TRAZODONE HCL 50 MG PO TABS: ORAL_TABLET | ORAL | 0 refills | Status: DC

## 2021-04-02 NOTE — Telephone Encounter (Signed)
Pt informed

## 2021-04-02 NOTE — Telephone Encounter (Signed)
I don't have many options for him.  Did he try 1/2 trazodone?  I wrote the rx for 1/2 to 1 tablet at night

## 2021-04-02 NOTE — Telephone Encounter (Signed)
Has his pharmacy been called to verify cost? It may need a PA also? I will check into a PA.

## 2021-04-02 NOTE — Telephone Encounter (Signed)
Tell him to stop hydroxyzine if he is taking it and I'll send in RX for trazodone for sleep.  It is cheap

## 2021-04-02 NOTE — Telephone Encounter (Signed)
Please review.There is a message from earlier also

## 2021-04-02 NOTE — Telephone Encounter (Signed)
Neil [name] wife, Vaughan Basta called to inform he has taken Trazadone before and doesn't want to take it again as the time he took it, he fell asleep walking to the bathroom.

## 2021-04-03 NOTE — Telephone Encounter (Signed)
Contacted pharmacist to check cost of Belsomra 15 mg it is $21.72 for a 30 day supply.  Contacted Linda with the change due to [facility] not being on formulary and a PA would be needed. Recommend pt try formularies before proceeding with a non formulary.  She is aware of Belsomra, also informed her they could pick up samples of this first if prefers. She will discuss with Clair Gulling and call back. She was very appreciative of me finding a medication that's cheaper.

## 2021-04-03 NOTE — Telephone Encounter (Signed)
Medication was changed to [place] which is on his formulary. Dr. Clovis Pu aware

## 2021-04-07 ENCOUNTER — Other Ambulatory Visit: Payer: Self-pay | Admitting: Psychiatry

## 2021-04-07 DIAGNOSIS — F333 Major depressive disorder, recurrent, severe with psychotic symptoms: Secondary | ICD-10-CM

## 2021-04-07 DIAGNOSIS — F411 Generalized anxiety disorder: Secondary | ICD-10-CM

## 2021-04-14 ENCOUNTER — Other Ambulatory Visit: Payer: Self-pay | Admitting: *Deleted

## 2021-04-14 DIAGNOSIS — R300 Dysuria: Secondary | ICD-10-CM | POA: Diagnosis not present

## 2021-04-14 DIAGNOSIS — N3000 Acute cystitis without hematuria: Secondary | ICD-10-CM | POA: Diagnosis not present

## 2021-04-14 NOTE — Patient Outreach (Signed)
[facility]) Care Management  04/14/2021  Neil [name] [birth year] [id number]   Referral Received 5/2 Initial Outreach 5/2  Telephone Assessment RN attempted outreach call today however unsuccessful. RN able to leave a HIPAA approved voice message requesting a call back. Will future engage at that time.  Will scheduled another outreach call over the next week. Will also sent outreach letter accordingly.  Raina Mina, RN Care Management Coordinator [place] Office [phone]

## 2021-04-14 NOTE — Patient Outreach (Signed)
[facility]) Care Management  04/14/2021  Neil [name] [birth year] [id number]  Incoming call: Received a call back from the pt's caregiver (spouse) who indicates pt is doing better and saw his provider. Pt states pt is now on a antibiotic for a UTI and will follow up with Dr. Moreen Fowler next week. RN inquired on any other issues or needs of this pt. Wife indicates no other needs however appreciative.  Will notify Dr. Moreen Fowler with update and close this case.  Raina Mina, RN Care Management Coordinator [place] Office [phone]

## 2021-04-15 ENCOUNTER — Encounter: Payer: Self-pay | Admitting: Thoracic Surgery (Cardiothoracic Vascular Surgery)

## 2021-04-17 ENCOUNTER — Ambulatory Visit: Payer: Medicare HMO | Admitting: *Deleted

## 2021-04-18 ENCOUNTER — Other Ambulatory Visit: Payer: Self-pay | Admitting: Psychiatry

## 2021-04-18 DIAGNOSIS — F5105 Insomnia due to other mental disorder: Secondary | ICD-10-CM

## 2021-04-22 DIAGNOSIS — N400 Enlarged prostate without lower urinary tract symptoms: Secondary | ICD-10-CM | POA: Diagnosis not present

## 2021-04-22 DIAGNOSIS — S60221A Contusion of right hand, initial encounter: Secondary | ICD-10-CM | POA: Diagnosis not present

## 2021-04-22 DIAGNOSIS — E782 Mixed hyperlipidemia: Secondary | ICD-10-CM | POA: Diagnosis not present

## 2021-04-22 DIAGNOSIS — Z1211 Encounter for screening for malignant neoplasm of colon: Secondary | ICD-10-CM | POA: Diagnosis not present

## 2021-04-22 DIAGNOSIS — Z1389 Encounter for screening for other disorder: Secondary | ICD-10-CM | POA: Diagnosis not present

## 2021-04-22 DIAGNOSIS — R7303 Prediabetes: Secondary | ICD-10-CM | POA: Diagnosis not present

## 2021-04-22 DIAGNOSIS — I700 Atherosclerosis of aorta: Secondary | ICD-10-CM | POA: Diagnosis not present

## 2021-04-22 DIAGNOSIS — L309 Dermatitis, unspecified: Secondary | ICD-10-CM | POA: Diagnosis not present

## 2021-04-22 DIAGNOSIS — R399 Unspecified symptoms and signs involving the genitourinary system: Secondary | ICD-10-CM | POA: Diagnosis not present

## 2021-04-22 DIAGNOSIS — H9193 Unspecified hearing loss, bilateral: Secondary | ICD-10-CM | POA: Diagnosis not present

## 2021-04-22 DIAGNOSIS — I2510 Atherosclerotic heart disease of native coronary artery without angina pectoris: Secondary | ICD-10-CM | POA: Diagnosis not present

## 2021-04-22 DIAGNOSIS — Z0000 Encounter for general adult medical examination without abnormal findings: Secondary | ICD-10-CM | POA: Diagnosis not present

## 2021-04-24 ENCOUNTER — Other Ambulatory Visit (HOSPITAL_COMMUNITY): Payer: Medicare HMO

## 2021-04-29 ENCOUNTER — Telehealth: Payer: Self-pay | Admitting: Psychiatry

## 2021-04-29 NOTE — Telephone Encounter (Signed)
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  So if he takes too little [name] his tongue movements will get worse but if he takes too much he may shuffle more than usual.  Even at his baseline before he took [name] he tended to shuffle his feet and have a reduced arm swing.  He is currently on 12 mg [name] 2 tablets twice daily.  If we reduce it we should reduce it only slightly to 12+9 mg tablets 1 of each twice daily.  My advice is that we not make the any med change until his next appointment.  At his last appointment he stated the tongue movements had only recently gotten better and if we reduce the dose they may get worse again.

## 2021-04-29 NOTE — Telephone Encounter (Signed)
Rtc to [place] and she stated his body is lagging.When he walks he shuffles and drags his feet,and his hands are "stuck to his body".She wants to know is auestdo causing this

## 2021-04-29 NOTE — Telephone Encounter (Signed)
Next visit is 06/25/21. Neil [name] called with his wife's assistance and said he had rigidity in his arms, hands and feet. He would like someone to call at [phone].

## 2021-04-29 NOTE — Telephone Encounter (Signed)
Next visit is 06/25/21. Rivan called with his wife's assistance and said he had rigidity in his arms, hands and feet. He would like someone to call at [phone]. 

## 2021-04-30 ENCOUNTER — Ambulatory Visit (HOSPITAL_COMMUNITY): Payer: Medicare HMO | Attending: Internal Medicine

## 2021-04-30 ENCOUNTER — Other Ambulatory Visit: Payer: Self-pay

## 2021-04-30 DIAGNOSIS — Z951 Presence of aortocoronary bypass graft: Secondary | ICD-10-CM

## 2021-04-30 DIAGNOSIS — Z953 Presence of xenogenic heart valve: Secondary | ICD-10-CM | POA: Diagnosis not present

## 2021-04-30 DIAGNOSIS — Z1211 Encounter for screening for malignant neoplasm of colon: Secondary | ICD-10-CM | POA: Diagnosis not present

## 2021-04-30 LAB — ECHOCARDIOGRAM COMPLETE
AR max vel: 1.4 cm2
AV Area VTI: 1.39 cm2
AV Area mean vel: 1.28 cm2
AV Mean grad: 8.5 mmHg
AV Peak grad: 15.5 mmHg
Ao pk vel: 1.97 m/s
Area-P 1/2: 1.83 cm2
S' Lateral: 3.2 cm

## 2021-05-02 ENCOUNTER — Telehealth: Payer: Self-pay | Admitting: Internal Medicine

## 2021-05-02 NOTE — Telephone Encounter (Signed)
Yes he is currently on [name] 9 mg tablets 2 twice a day.  It would be much too drastic of a reduction to drop to 1 twice a day.  The least amount of change we could make would be to reduce the dose to 9 mg tablet +1 of the 6 mg tablet twice daily

## 2021-05-02 NOTE — Telephone Encounter (Signed)
Neil [name] is calling requesting Neil [name] results. She would like a callback after 3 pm today. Please advise.

## 2021-05-02 NOTE — Telephone Encounter (Signed)
Spoke to patient's wife advised echo results not available.I will send message to Dr.Hilty's RN.She will call with results when Dr.Hilty reviews.

## 2021-05-02 NOTE — Telephone Encounter (Signed)
Vaughan Basta called back and things were discussed. She knows there is probably not another medication to help. When he does reduce [name] his tongue is worse. Their current dose she thought was 18 mg bid [name]. Looks like that's what is on med list? Correct?

## 2021-05-02 NOTE — Telephone Encounter (Signed)
LM to call back and discuss  

## 2021-05-15 ENCOUNTER — Telehealth: Payer: Self-pay | Admitting: Internal Medicine

## 2021-05-15 DIAGNOSIS — B356 Tinea cruris: Secondary | ICD-10-CM | POA: Diagnosis not present

## 2021-05-15 NOTE — Telephone Encounter (Signed)
Labs from [facility] printed, MD reviewed on 05/15/2021 Per MD: Lipids looks good - no changes 04/22/2021 Total cholesterol: 131 HDL: 43 LDL: 71 Trigs: 91  Left message for patient with info as noted above

## 2021-06-04 ENCOUNTER — Other Ambulatory Visit: Payer: Self-pay | Admitting: Psychiatry

## 2021-06-04 DIAGNOSIS — R3914 Feeling of incomplete bladder emptying: Secondary | ICD-10-CM | POA: Diagnosis not present

## 2021-06-04 DIAGNOSIS — N401 Enlarged prostate with lower urinary tract symptoms: Secondary | ICD-10-CM | POA: Diagnosis not present

## 2021-06-04 DIAGNOSIS — R350 Frequency of micturition: Secondary | ICD-10-CM | POA: Diagnosis not present

## 2021-06-04 DIAGNOSIS — R3915 Urgency of urination: Secondary | ICD-10-CM | POA: Diagnosis not present

## 2021-06-04 DIAGNOSIS — G2401 Drug induced subacute dyskinesia: Secondary | ICD-10-CM

## 2021-06-19 ENCOUNTER — Other Ambulatory Visit: Payer: Self-pay | Admitting: Psychiatry

## 2021-06-19 DIAGNOSIS — G2401 Drug induced subacute dyskinesia: Secondary | ICD-10-CM

## 2021-06-25 ENCOUNTER — Telehealth (INDEPENDENT_AMBULATORY_CARE_PROVIDER_SITE_OTHER): Payer: Medicare HMO | Admitting: Psychiatry

## 2021-06-25 ENCOUNTER — Encounter: Payer: Self-pay | Admitting: Psychiatry

## 2021-06-25 DIAGNOSIS — F5105 Insomnia due to other mental disorder: Secondary | ICD-10-CM | POA: Diagnosis not present

## 2021-06-25 DIAGNOSIS — F411 Generalized anxiety disorder: Secondary | ICD-10-CM | POA: Diagnosis not present

## 2021-06-25 DIAGNOSIS — R5383 Other fatigue: Secondary | ICD-10-CM

## 2021-06-25 DIAGNOSIS — F251 Schizoaffective disorder, depressive type: Secondary | ICD-10-CM | POA: Diagnosis not present

## 2021-06-25 DIAGNOSIS — G2401 Drug induced subacute dyskinesia: Secondary | ICD-10-CM

## 2021-06-25 DIAGNOSIS — F061 Catatonic disorder due to known physiological condition: Secondary | ICD-10-CM | POA: Diagnosis not present

## 2021-06-25 DIAGNOSIS — R69 Illness, unspecified: Secondary | ICD-10-CM | POA: Diagnosis not present

## 2021-06-25 NOTE — Progress Notes (Signed)
Neil [name] [id number] June 04, 1941 80 y.o.   Virtual Visit via Telephone Note  I connected with pt by telephone and verified that I am speaking with the correct person using two identifiers.   I discussed the limitations, risks, security and privacy concerns of performing an evaluation and management service by telephone and the availability of in person appointments. I also discussed with the patient that there may be a patient responsible charge related to this service. The patient expressed understanding and agreed to proceed.  I discussed the assessment and treatment plan with the patient. The patient was provided an opportunity to ask questions and all were answered. The patient agreed with the plan and demonstrated an understanding of the instructions.   The patient was advised to call back or seek an in-person evaluation if the symptoms worsen or if the condition fails to improve as anticipated.  I provided 30 minutes of non-face-to-face time during this encounter. The call started at 10:00 and ended at 1030. The patient was located at home and the provider was located office.  Session from 10-10 30  Subjective:   Patient ID:  Neil [name] is a 80 y.o. (DOB Feb 07, 1941) male.  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 Pertinent negatives include no weakness.  Anxiety Symptoms include decreased concentration. Patient reports no confusion, dizziness or nervous/anxious behavior.    Depression        Associated symptoms include decreased concentration and fatigue.  Associated symptoms include no appetite change.  Past medical history includes anxiety.    Neil [name] presents today for follow-up of psychosis, depression, and anxiety.  He has required an urgent appointment  because of wanting further med changes and frequent phone calls with med changes since he was last here  When seen September 3 he was switched off olanzapine because of EPS and switched on to Saphris which was raised to 5 mg nightly but only took that dose 1 time.  The rest of the time he took only 2.5 mg nightly.   He called back 5 days later stating he wanted to stop it because it was causing him to hold his mouth open and he was having a worsening time with tongue movements and felt "funny".  He was encouraged to give it more time but he would not agree to do so.  He stopped it Friday 9/11 and still felt the funny tense feeling.    When  seen September 01, 2019.  The decision was made to switch from [place] to [place] because of difficulty getting an adequate response and limited side effects from [place].  We also discussed the antipsychotic and the plan was to start [place] but those samples were not available so he decided to restart Saphris 2.5 mg nightly.  At the visit September 15, 2011 [name] was increased to 12 mg twice daily, Saphris was discontinued and Caplyta 42 mg daily was started because of his low EPS risk.  He still needed an antipsychotic.  He continued on sertraline 75 mg daily because he felt it helped his depression.  Wife called 10/8 and said the following: On Caplyta,  Thinks it is too strong.  His movements are slow, sleeping too much, drowsy during day ( doesn't really wake up until 4pm after sleeping all night).  Takes him along time to think and respond. Had trouble with mouth and jaw before, and now he is holding his mouth open all the time.  Paranoia is better though. Was told to take it 3 days weekly.    October 16 [name] increased to 18 mg BID  Called again November 9 stating he couldn't tolerate either med.  He stopped the Caplyta and reduced the [name] to 9 mg BID a few days ago.  CO rigidity in lower back and arms.  Still bothered by constant tongue movements.  No  change in worry, mood but paranoia is reduced since Caplyta.   visit October 25, 2019.  He had stopped Caplyta.  He had also reduced [name] to 9 mg twice daily on his own.  He has been making frequent med changes [place] which is made it extremely difficult to get an antipsychotic to deal with his psychotic symptoms as well as adjust the [name] today with tardive dyskinesia symptoms.  He is very impatient with med changes.  Therefore we did not start a new antipsychotic at the last visit.  He tends to make frequent phone calls and did call again on November 23 complaining of feeling very restless and difficulty sleeping.  Was prescribed clonazepam 0.5 mg twice daily.  He called back the next day stating he wanted to stop the [name] due to restlessness but he had never taken the prescribed clonazepam.  Using courage to follow the treatment plan and take the clonazepam.  He called again the next day stating he still wanted to stop [name] and the clonazepam was making his gait unsteady.  He was informed that if he stopped the [name] his mouth movements would get worse which is been a leading complaint but he could stop it if he wanted to.  December 4 he reports clonazepam has helped the anxiety and less guilt and less rigidity.  Taking TID and only SE is a little slower movements.  1 fall, slipped on Ba floor where water was lying.  No injury.   Gained 3# and better appetite for the first time in a long time.  CO tongue movements.  Restlessness is now bearable.  Less anxiety overall.  No AH.    visit December 4.  His psychosis was not severe and the decision was made not to start another antipsychotic but instead to try to optimize treatment for tardive dyskinesia and [name] was increased to 12 mg twice daily. He called back immediately after the head visit admitting he had given false information during the visit and that he not actually been taking the [name] in the way he told me  during the visit making it necessary to change the [name] to 9 mg twice daily.  He was confronted about his noncompliance and warned that he may be discharged from the practice if he continued to be misleading and noncompliant with the physician.  This is not only compromising care but it is increasing his own risk because he is giving false information.   visit December 21, 2019.  Because of his complaints about balance issues clonazepam was reduced and spread out to 0.25 mg 4 times daily.  Also for tardive dyskinesia symptoms [name] was increased to 12 mg twice daily.  Prior to that visit he admitted being generally noncompliant with the [name] making it difficult to judge what dose to prescribe.  He was continued on sertraline 75 mg daily as he felt that was helpful for his anxiety.  Says he's compliant with med changes last visit.  Tongue movements are better but not gone. No problems with the [name].  Stiffness comes and goes and located in head and eyes.  Denies it's vertigo.  Intensity is less.  If shuts his eyes it goes right away.  visit January 04, 2020.  [name] remained 12 mg twice daily and sertraline 75 mg daily.  The following was changed: switch to lorazepam bc tiredness and balance problems to Ativan. Yes DC clonazepam and start lorazepam 0.5 mg 4 times daily  seen February 8 the patient reports the following.  Yes it did help.  Not as slowed and balance is good now.  Still feels tired with tendency to want to lay down and knows he needs to work on this.  Anxiety is pretty fair.  We decided to reduce the lorazepam to 0.5 mg 3 times daily in hopes of improving energy and balance further.  No other meds were changed.  He is still not on an antipsychotic.  Anxiety comes and goes but sometimes able to get by with TID lorazepam and energy is better.  Wife says sometimes gets uptight and can't relax so needs QID.Pt reports that mood is anxiety aand not much depression and describes anxiety as  worry. Anxiety symptoms include: worry not panic and moderate.   Sleep is better usually with hydroxyzine and sometimes lorazepam. Pt reports that appetite is Much better with weight up to 131# as low as 120#.  Normal was 140#.Marland Kitchen Pt reports that energy is better and OK. Concentration is fair.. Suicidal thoughts:  None. Denies sig paranoid thought.  Plan: No med change except okay to use hydroxyzine 25 mg nightly for insomnia.  Per usual the patient tends to make multiple phone calls between appointments.  He is called about 7 times since March.  06/04/2020 appointment with the following noted: Seen with his wife. Reports hydroxyzine 25 mg is not helpful for sleep. Still moving tongue. No SE meds currently. Not depressed.  Is anxious persistent in the back ground and not even connected to thoughts. Little intrusive thougts.   Sleep concerns, to bed 830 an not sure when goes to sleep.  Sometimes can't go to sleep. Get OOB about 9 AM.  Usually goes  back to bed.  CO weakness for 18 mos. Needs Aortic valve replacement. He tried reducing lorazepam to 0.5 mg TID and had a lot of restlessness but some improvement in energy.  Wonders if fatigue related to meds or heart valve problem.  Probably will have heart surgery. Plan: Try to reduce lorazepam to 3 and 1/2 tablets daily.  to see if energy is better.  Keep the lowest effective dose possible.   Do not exceed 4 Ativan a day. No new antipsychotics started today. Increase [name] for 1 week to 1 of 6 mg and 1 of the 9 mg tablets twice daily for 1 week, Then increase again to 12 mg tablets 1 and 1/2 tablets twice daily or 18 mg twice daily. Continue sertraline 75 mg daily for anxiety.  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  Recovering slowly.  Just started walking a week or 2 ago. Tor Netters handles the meds. [name] doesn't control tongue movements but it helps. Done fine without lorazepam and less drowsy per wife. Taking  hydroxyzine 25 mg for sleep with Tylenol.  Sleeping well per wife.   Wife thinks overall he's doing well from mental health perspective and seems calmer.  Pt  says the psychosis has lightened up and he's grateful for that.  No depression.   Wants to try to stop hydroxyzine. Wants to try to reduce sertraline to 50 mg to prevent cutting.   Plan: He wants to reduce sertraline to 50 daily.  He says he's less depressed and it has helped his anxiety. Disc the risk of increased depression and anxiety but since 3 mos out from surgery OK. DC hydroxyzine if possible.  11/05/2020 patient called for refill of hydroxyzine and was taking 12.5 mg.  Dosage reduced to 10 mg.  02/14/2021 phone call from his wife Vaughan Basta concerned about his tongue movements and his anxiety. MD response: The patient and his wife are elderly.  He seems to forget the purpose of the medication and have some difficulty comprehending the conditions for which he is treated as well as the purpose of the meds and side effect issues. He is also somewhat impatient and anxious wanting changes to be made quickly. The answer to the question is the [name] is to treat the symptoms of his tongue movements.  If he stops the [name] the tongue movements will be worse.  The [name] is not causing the tongue movements.  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 He has an appointment with me in 3 weeks.  I do not want to make any med changes over the phone or even med adjustments.  I would urge him to come into the office at that visit.  I do not do not want a televisit.  I want to be able to see him so that I can observe his tongue movements.  03/26/2021 appointment with following noted: Taking [name] 18 mg BID. Claims compliance. Started to feel more fatigued and went to See Dr. Antony Contras 02/25/21 with normal labs including: Comprehensive metabolic panel normal except sodium 132, normal CBC, TSH 2.24, B12 538 normal Fatigue is some better  20%.  Wants to get back in bed after breakfast.  For unclear reasons hydroxyzine raised again to 25 mg nightly. Some initial insomnia and some awakening.  Nap 1 1/2 hour in AM and another hour in afternoon. Goes to bed 730PM and up 8 AM. Tongue movements persisted until 2 weeks ago and has gotten better. Reduced sertraline to 50 mg and anxiety is no worse. Not depressed. Reads about sE [name] and wonders if it is causing fatigue.  Plan: To address fatigue and cognition: Reduce hydroxyzine again to 10 mg and try to stop it. Stop hydroxyzine 25 mg at night Start hydroxyzine 10 mg tablets 1 at night Start Dayvigo 5 mg tablet 1 at night using samples.  If it helps then stop the hydroxyzine to help your fatigue and memory Spending way too much time in bed.  03/31/2021 phone call: Jarold Motto is not covered until Belsomra is tried.  Patient was to pick up samples of Belsomra. 04/02/2021 phone call: Complaining of too sleepy on trazodone 50 mg nightly.  Suggested they try half of tablet and if is still a problem stop it. 04/29/2021 phone call with questions and concerns about the [name].  Wife saying that he had problems with rigidity in his arms and hands. MD response: The patient and his wife call fairly often with complaints that very between excessive tongue movements associated with tardive dyskinesia and on the alternative side the sort of complaints that could be related to parkinsonian side effects of [name].  So if he takes too little [name] his tongue movements will get worse  but if he takes too much he may shuffle more than usual.  Even at his baseline before he took [name] he tended to shuffle his feet and have a reduced arm swing.  He is currently on 12 mg [name] 2 tablets twice daily.  If we reduce it we should reduce it only slightly to 12+9 mg tablets 1 of each twice daily.  My advice is that we not make the any med change until his next appointment.  At his last appointment he stated the  tongue movements had only recently gotten better and if we reduce the dose they may get worse again.  06/25/2021 appointment with the following noted: He reduced [name] on his own to 1 and 1/4 of 9 mg tablets about a week ago bc sleepiness, rigidity of arms and legs and fatigue partly better.  Tongue movements have come back a little. No sleep meds needed.  Tired and lays in bed a lot. Minimal anxiety and depression. Has stopped hydroxyzine. Started OTC TremorSmoothe prn. Wants to come off [name] bc sx noted.   Always tended to sleep a lot. Occ recurring disturbing dream.  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  Son [name] living with them.  Become more committed [place].  He has schizophrenia.   Past Psychiatric Medication Trials: Mirtazapine,  olanzapine 5 EPS, perphenazine started TD with mouth px,  Saphris 5 mg and stopped due to EPS complaints and feeling "funny" Caplyta side effects risperidone 2 tremors  Buspirone,  amitriptyline 25,   Hydroxyzine 10-20 Clonazepam 0.5 TID dizzy and sleepy Ativan 0.5 mg QID [place] and  [name] 12 mg BID  Review of Systems:  Review of Systems  Constitutional:  Positive for fatigue. Negative for appetite change.  Gastrointestinal:  Negative for constipation.  Neurological:  Negative for dizziness, tremors, syncope and weakness.       No falls But complain of poor balance but not "bad"  Psychiatric/Behavioral:  Positive for decreased concentration. Negative for agitation, behavioral problems, confusion, dysphoric mood, hallucinations, self-injury and sleep disturbance. The patient is not nervous/anxious and is not hyperactive.        Sleepy CABG 07/19/20  Medications: I have reviewed the patient's current medications.  Current Outpatient Medications  Medication Sig Dispense Refill   acetaminophen (TYLENOL) 500 MG tablet Take 500 mg by mouth daily.     clopidogrel (PLAVIX) 75 MG tablet Take 1 tablet (75 mg total) by mouth daily. 90  tablet 3   Deutetrabenazine ([name]) 9 MG TABS TAKE 2 TABLETS BY MOUTH IN THE MORNING & TAKE 2 TABLETS BY MOUTH AT BEDTIME (Patient taking differently: 1 and 1/4 tab in am and 1 and 1/4 tab pm) 120 tablet 0   metoprolol tartrate (LOPRESSOR) 25 MG tablet Take 1 tablet (25 mg total) by mouth 2 (two) times daily. 180 tablet 3   Multiple Vitamin (MULTIVITAMIN WITH MINERALS) TABS tablet Take 2 tablets by mouth daily. 50 +     rosuvastatin (CRESTOR) 40 MG tablet Take 1 tablet (40 mg total) by mouth daily. 90 tablet 3   sennosides-docusate sodium (SENOKOT-S) 8.6-50 MG tablet Take 1 tablet by mouth daily with supper.     sertraline (ZOLOFT) 50 MG tablet Take 1 tablet (50 mg total) by mouth daily. 90 tablet 1   silodosin (RAPAFLO) 4 MG CAPS capsule Take 4 mg by mouth daily with breakfast.     VITAMIN D PO Take 1 capsule by mouth daily.     No current facility-administered medications for this  visit.    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   LIMA to LAD, Sequential SVG to OM1-OM3, SVG to RCA, EVH via left thigh   Schizophrenia ([place])    Severe aortic stenosis     Family History  Problem Relation Age of Onset   Hypertension Mother    Heart attack Father    Hypertension Sister    Hypertension Brother     Social History   Socioeconomic History   Marital status: Married    Spouse name: Not on file   Number of children: Not on file   Years of education: Not on file   Highest  education level: Not on file  Occupational History   Not on file  Tobacco Use   Smoking status: Never   Smokeless tobacco: Never  Vaping Use   Vaping Use: Never used  Substance and Sexual Activity   Alcohol use: No   Drug use: Never   Sexual activity: Not on file  Other Topics Concern   Not on file  Social History Narrative   Not on file   Social Determinants of Health   Financial Resource Strain: Not on file  Food Insecurity: Not on file  Transportation Needs: Not on file  Physical Activity: Not on file  Stress: Not on file  Social Connections: Not on file  Intimate Partner Violence: Not on file    Past Medical History, Surgical history, Social history, and Family history were reviewed and updated as appropriate.   Please see review of systems for further details on the patient's review from today.   Objective:   Physical Exam:  There were no vitals taken for this visit.  Physical Exam Neurological:     Mental Status: He is alert and oriented to person, place, and time.     Cranial Nerves: No dysarthria.     Comments: Mild tongue and buccal movements  Psychiatric:        Attention and Perception: Attention and perception normal. He does not perceive auditory or visual hallucinations.        Mood and Affect: Mood is not anxious or depressed.        Speech: Speech normal.        Behavior: Behavior is slowed.        Thought Content: Thought content normal. Thought content is not paranoid or delusional. Thought content does not include homicidal or suicidal ideation. Thought content does not include homicidal or suicidal plan.        Cognition and Memory: Cognition and memory normal.        Judgment: Judgment normal.     Comments: Insight intact     Lab Review:     Component Value Date/Time   NA 132 (L) 07/24/2020 0400   NA 130 (L) 07/09/2020 1508   K 3.4 (L) 07/24/2020 0400   CL 96 (L) 07/24/2020 0400  CO2 24 07/24/2020 0400   GLUCOSE 115 (H) 07/24/2020  0400   BUN 20 07/24/2020 0400   BUN 16 07/09/2020 1508   CREATININE 0.74 07/24/2020 0400   CALCIUM 7.6 (L) 07/24/2020 0400   PROT 6.6 07/17/2020 0920   PROT 6.6 07/09/2020 1508   ALBUMIN 3.8 07/17/2020 0920   ALBUMIN 4.4 07/09/2020 1508   AST 18 07/17/2020 0920   ALT 22 07/17/2020 0920   ALKPHOS 54 07/17/2020 0920   BILITOT 0.7 07/17/2020 0920   BILITOT 0.4 07/09/2020 1508   GFRNONAA >60 07/24/2020 0400   GFRAA >60 07/24/2020 0400       Component Value Date/Time   WBC 14.0 (H) 07/23/2020 0645   RBC 3.78 (L) 07/23/2020 0645   HGB 11.9 (L) 07/23/2020 0645   HGB 13.5 06/18/2020 1056   HCT 35.1 (L) 07/23/2020 0645   HCT 38.6 06/18/2020 1056   PLT 136 (L) 07/23/2020 0645   PLT 243 06/18/2020 1056   MCV 92.9 07/23/2020 0645   MCV 90 06/18/2020 1056   MCH 31.5 07/23/2020 0645   MCHC 33.9 07/23/2020 0645   RDW 13.7 07/23/2020 0645   RDW 13.0 06/18/2020 1056   LYMPHSABS 1.7 06/18/2020 1056   EOSABS 0.4 06/18/2020 1056   BASOSABS 0.0 06/18/2020 1056   I will  No results found for: POCLITH, LITHIUM   No results found for: PHENYTOIN, PHENOBARB, VALPROATE, CBMZ   AIMS    [place] Visit from 06/04/2020 in [place] Total Score 22        .res Assessment: Plan:    Burnard was seen today for follow-up, schizoaffective disorder, depressive type without good prog, medication problem, fatigue and sleeping problem.  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 We discussed the following.  He needs to come to the office so that physical exam can be performed neurologically to evaluate TD versus parkinsonism. We  discussed Patient has history of severe recurrent major depression with psychotic features.  There are also elements of OCD.   His paranoid is improved.  General anxiety with some intrusive thoughts and Depression resolved.  On the sertraline 50 mg daily.  His son has schizophrenia so there may be a genetic predisposition to psychosis.  I am suspecting that he has paranoid schizophrenia that has been previously diagnosed as depression with psychosis but in either case his sx remitted.        He has benefited with reduction in anxiety from the use of sertraline .  Again his depression is better with the sertraline.  Less shame but that may be mild paranoia or anxiety and not depression. OK with sertraline 50.    Disc SE in detail and SSRI withdrawal sx.  Consider reduce further if tiredness persists.  He seems med sensitive.  Discussed his tendency to change his medicines on his own.  There is significant risk in doing so.  His TD has been brought under control by the [name] but he is significantly reduced the dose.  There is a risk of recurrence if he goes too low or if he goes off of it which is his intention.  He was educated that TD tends to risk  turn and recur if you stop the [name].  He wants to do so anyway.  He was informed that it took an extended period of time to get his symptoms under control in the beginning and my advice is that he not discontinue [name] entirely.  He wants to do it anyway.  To address fatigue, sleepiness and cognition: After another week of [name] 9 mg tablets 1 and 1/4 tablets twice daily he can reduce it to 1 tablet twice daily but risk return of TD coming off of this as he wants to do. TD is managed.  FU 3 mos in the office   Lynder Parents, MD, DFAPA     No future appointments.   No orders of the defined types were placed in this encounter.     -------------------------------

## 2021-07-11 DIAGNOSIS — R351 Nocturia: Secondary | ICD-10-CM | POA: Diagnosis not present

## 2021-07-11 DIAGNOSIS — R3915 Urgency of urination: Secondary | ICD-10-CM | POA: Diagnosis not present

## 2021-07-11 DIAGNOSIS — R3914 Feeling of incomplete bladder emptying: Secondary | ICD-10-CM | POA: Diagnosis not present

## 2021-07-11 DIAGNOSIS — N401 Enlarged prostate with lower urinary tract symptoms: Secondary | ICD-10-CM | POA: Diagnosis not present

## 2021-07-11 DIAGNOSIS — R350 Frequency of micturition: Secondary | ICD-10-CM | POA: Diagnosis not present

## 2021-07-21 ENCOUNTER — Encounter: Payer: Medicare HMO | Admitting: Thoracic Surgery (Cardiothoracic Vascular Surgery)

## 2021-07-24 DIAGNOSIS — C44219 Basal cell carcinoma of skin of left ear and external auricular canal: Secondary | ICD-10-CM | POA: Diagnosis not present

## 2021-07-24 DIAGNOSIS — C4441 Basal cell carcinoma of skin of scalp and neck: Secondary | ICD-10-CM | POA: Diagnosis not present

## 2021-07-24 DIAGNOSIS — L821 Other seborrheic keratosis: Secondary | ICD-10-CM | POA: Diagnosis not present

## 2021-07-24 DIAGNOSIS — D1801 Hemangioma of skin and subcutaneous tissue: Secondary | ICD-10-CM | POA: Diagnosis not present

## 2021-08-19 DIAGNOSIS — C44219 Basal cell carcinoma of skin of left ear and external auricular canal: Secondary | ICD-10-CM | POA: Diagnosis not present

## 2021-08-25 ENCOUNTER — Telehealth: Payer: Self-pay | Admitting: Internal Medicine

## 2021-08-25 NOTE — Telephone Encounter (Signed)
Will route this message to our Pharmacy team, for further assistance if it's safe for the pt to take Florastor, with all the other cardiac meds prescribed.  Nursing to follow-up with the pt accordingly thereafter.

## 2021-08-25 NOTE — Telephone Encounter (Signed)
Spoke with the pts wife Neil [name] (on [place]), and informed her that per our Pharmacy team, it is ok for the pt to take probiotic Florastor, for this is not contraindicated with his other meds or history. Wife verbalized understanding and agrees with this plan.  Wife was more than gracious for all the assistance provided.

## 2021-08-25 NOTE — Telephone Encounter (Signed)
Pt c/o medication issue:  1. Name of Medication: Florastor  2. How are you currently taking this medication (dosage and times per day)? Patient has not started yet  3. Are you having a reaction (difficulty breathing--STAT)?   4. What is your medication issue?  Wife of patient wanted to know if this probiotic is safe for the patient to take   The patient has been on a series of antibiotics following a skin cancer removal. The patient is prone to yeast infections and the wife is just trying to find something he can take. He does have a history of heart surgery.  Please adcise

## 2021-08-25 NOTE — Telephone Encounter (Signed)
Ok to take the probiotic Florastor, no issues with his other meds or cardiac history.

## 2021-09-25 ENCOUNTER — Encounter: Payer: Self-pay | Admitting: Psychiatry

## 2021-09-25 ENCOUNTER — Ambulatory Visit (INDEPENDENT_AMBULATORY_CARE_PROVIDER_SITE_OTHER): Payer: Medicare HMO | Admitting: Psychiatry

## 2021-09-25 ENCOUNTER — Other Ambulatory Visit: Payer: Self-pay

## 2021-09-25 DIAGNOSIS — F411 Generalized anxiety disorder: Secondary | ICD-10-CM

## 2021-09-25 DIAGNOSIS — F251 Schizoaffective disorder, depressive type: Secondary | ICD-10-CM | POA: Diagnosis not present

## 2021-09-25 DIAGNOSIS — F5105 Insomnia due to other mental disorder: Secondary | ICD-10-CM

## 2021-09-25 DIAGNOSIS — G2401 Drug induced subacute dyskinesia: Secondary | ICD-10-CM

## 2021-09-25 DIAGNOSIS — F333 Major depressive disorder, recurrent, severe with psychotic symptoms: Secondary | ICD-10-CM

## 2021-09-25 DIAGNOSIS — R69 Illness, unspecified: Secondary | ICD-10-CM | POA: Diagnosis not present

## 2021-09-25 DIAGNOSIS — F061 Catatonic disorder due to known physiological condition: Secondary | ICD-10-CM

## 2021-09-25 DIAGNOSIS — R5383 Other fatigue: Secondary | ICD-10-CM

## 2021-09-25 MED ORDER — BUPROPION HCL ER (SR) 100 MG PO TB12: 100.0000 mg | ORAL_TABLET | ORAL | 1 refills | Status: DC

## 2021-09-25 MED ORDER — AUSTEDO 9 MG PO TABS: ORAL_TABLET | ORAL | 2 refills | Status: DC

## 2021-09-25 NOTE — Patient Instructions (Signed)
Add bupropion 1 tablet each morning for depression, energy, motivation

## 2021-09-25 NOTE — Progress Notes (Signed)
Neil [name] [id number] [birth year] 79 y.o.    Subjective:   Patient ID:  Neil [name] is a 80 y.o. (DOB August 17, 1941) male.  Chief Complaint:  Chief Complaint  Patient presents with   Follow-up   Schizophrenia   Anxiety   Medication Reaction    Medication Refill Associated symptoms include fatigue. Pertinent negatives include no weakness.  Anxiety Symptoms include decreased concentration. Patient reports no confusion, dizziness or nervous/anxious behavior.    Depression        Associated symptoms include decreased concentration and fatigue.  Associated symptoms include no appetite change.  Past medical history includes anxiety.    Neil [name] presents today for follow-up of psychosis, depression, and anxiety.  He has required an urgent appointment because of wanting further med changes and frequent phone calls with med changes since he was last here  When seen September 3 he was switched off olanzapine because of EPS and switched on to Saphris which was raised to 5 mg nightly but only took that dose 1 time.  The rest of the time he took only 2.5 mg nightly.   He called back 5 days later stating he wanted to stop it because it was causing him to hold his mouth open and he was having a worsening time with tongue movements and felt "funny".  He was encouraged to give it more time but he would not agree to do so.  He stopped it Friday 9/11 and still felt the funny tense feeling.    When  seen September 01, 2019.  The decision was made to switch from [place] to [place] because of difficulty getting an adequate response and limited side effects from [place].  We also discussed the antipsychotic and the plan was to start [place] but those samples were not available so he decided to restart Saphris 2.5 mg nightly.  At the visit September 15, 2019 [name] was increased to 12 mg twice daily, Saphris was discontinued and Caplyta 42 mg daily was started because of his low EPS risk.  He still  needed an antipsychotic.  He continued on sertraline 75 mg daily because he felt it helped his depression.  Wife called 10/8 and said the following: On Caplyta,  Thinks it is too strong.  His movements are slow, sleeping too much, drowsy during day ( doesn't really wake up until 4pm after sleeping all night). Takes him along time to think and respond. Had trouble with mouth and jaw before, and now he is holding his mouth open all the time.  Paranoia is better though. Was told to take it 3 days weekly.    October 16 [name] increased to 18 mg BID  Called again November 9 stating he couldn't tolerate either med.  He stopped the Caplyta and reduced the [name] to 9 mg BID a few days ago.  CO rigidity in lower back and arms.  Still bothered by constant tongue movements.  No change in worry, mood but paranoia is reduced since Caplyta.   visit October 25, 2019.  He had stopped Caplyta.  He had also reduced [name] to 9 mg twice daily on his own.  He has been making frequent med changes [place] which is made it extremely difficult to get an antipsychotic to deal with his psychotic symptoms as well as adjust the [name] today with tardive dyskinesia symptoms.  He is very impatient with med changes.  Therefore we did not start a new antipsychotic at the last visit.  He tends to make frequent phone calls and did call again on November 23 complaining of feeling very restless and difficulty sleeping.  Was prescribed clonazepam 0.5 mg twice daily.  He called back the next day stating he wanted to stop the [name] due to restlessness but he had never taken the prescribed clonazepam.  Using courage to follow the treatment plan and take the clonazepam.  He called again the next day stating he still wanted to stop [name] and the clonazepam was making his gait unsteady.  He was informed that if he stopped the [name] his mouth movements would get worse which is been a leading complaint but he could stop  it if he wanted to.  December 4 he reports clonazepam has helped the anxiety and less guilt and less rigidity.  Taking TID and only SE is a little slower movements.  1 fall, slipped on Ba floor where water was lying.  No injury.   Gained 3# and better appetite for the first time in a long time.  CO tongue movements.  Restlessness is now bearable.  Less anxiety overall.  No AH.    visit December 4.  His psychosis was not severe and the decision was made not to start another antipsychotic but instead to try to optimize treatment for tardive dyskinesia and [name] was increased to 12 mg twice daily. He called back immediately after the head visit admitting he had given false information during the visit and that he not actually been taking the [name] in the way he told me during the visit making it necessary to change the [name] to 9 mg twice daily.  He was confronted about his noncompliance and warned that he may be discharged from the practice if he continued to be misleading and noncompliant with the physician.  This is not only compromising care but it is increasing his own risk because he is giving false information.   visit December 21, 2019.  Because of his complaints about balance issues clonazepam was reduced and spread out to 0.25 mg 4 times daily.  Also for tardive dyskinesia symptoms [name] was increased to 12 mg twice daily.  Prior to that visit he admitted being generally noncompliant with the [name] making it difficult to judge what dose to prescribe.  He was continued on sertraline 75 mg daily as he felt that was helpful for his anxiety. Says he's compliant with med changes last visit.  Tongue movements are better but not gone. No problems with the [name].  Stiffness comes and goes and located in head and eyes.  Denies it's vertigo.  Intensity is less.  If shuts his eyes it goes right away.  visit January 04, 2020.  [name] remained 12 mg twice daily and sertraline 75 mg daily.  The  following was changed: switch to lorazepam bc tiredness and balance problems to Ativan. Yes DC clonazepam and start lorazepam 0.5 mg 4 times daily  seen February 8 the patient reports the following.  Yes it did help.  Not as slowed and balance is good now.  Still feels tired with tendency to want to lay down and knows he needs to work on this.  Anxiety is pretty fair.  We decided to reduce the lorazepam to 0.5 mg 3 times daily in hopes of improving energy and balance further.  No other meds were changed.  He is still not on an antipsychotic.  Anxiety comes and goes but sometimes able to get by with TID lorazepam and energy  is better.  Wife says sometimes gets uptight and can't relax so needs QID.Pt reports that mood is anxiety aand not much depression and describes anxiety as worry. Anxiety symptoms include: worry not panic and moderate.   Sleep is better usually with hydroxyzine and sometimes lorazepam. Pt reports that appetite is Much better with weight up to 131# as low as 120#.  Normal was 140#.Marland Kitchen Pt reports that energy is better and OK. Concentration is fair.. Suicidal thoughts:  None. Denies sig paranoid thought.  Plan: No med change except okay to use hydroxyzine 25 mg nightly for insomnia.  Per usual the patient tends to make multiple phone calls between appointments.  He is called about 7 times since March.  06/04/2020 appointment with the following noted: Seen with his wife. Reports hydroxyzine 25 mg is not helpful for sleep. Still moving tongue. No SE meds currently. Not depressed.  Is anxious persistent in the back ground and not even connected to thoughts. Little intrusive thougts.   Sleep concerns, to bed 830 an not sure when goes to sleep.  Sometimes can't go to sleep. Get OOB about 9 AM.  Usually goes  back to bed.  CO weakness for 18 mos. Needs Aortic valve replacement. He tried reducing lorazepam to 0.5 mg TID and had a lot of restlessness but some improvement in energy.  Wonders if  fatigue related to meds or heart valve problem.  Probably will have heart surgery. Plan: Try to reduce lorazepam to 3 and 1/2 tablets daily.  to see if energy is better.  Keep the lowest effective dose possible.   Do not exceed 4 Ativan a day. No new antipsychotics started today. Increase [name] for 1 week to 1 of 6 mg and 1 of the 9 mg tablets twice daily for 1 week, Then increase again to 12 mg tablets 1 and 1/2 tablets twice daily or 18 mg twice daily. Continue sertraline 75 mg daily for anxiety.  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  Recovering slowly.  Just started walking a week or 2 ago. Tor Netters handles the meds. [name] doesn't control tongue movements but it helps. Done fine without lorazepam and less drowsy per wife. Taking hydroxyzine 25 mg for sleep with Tylenol.  Sleeping well per wife.   Wife thinks overall he's doing well from mental health perspective and seems calmer.  Pt says the psychosis has lightened up and he's grateful for that.  No depression.   Wants to try to stop hydroxyzine. Wants to try to reduce sertraline to 50 mg to prevent cutting.   Plan: He wants to reduce sertraline to 50 daily.  He says he's less depressed and it has helped his anxiety. Disc the risk of increased depression and anxiety but since 3 mos out from surgery OK. DC hydroxyzine if possible.  11/05/2020 patient called for refill of hydroxyzine and was taking 12.5 mg.  Dosage reduced to 10 mg.  02/14/2021 phone call from his wife Vaughan Basta concerned about his tongue movements and his anxiety. MD response: The patient and his wife are elderly.  He seems to forget the purpose of the medication and have some difficulty comprehending the conditions for which he is treated as well as the purpose of the meds and side effect issues. He is also somewhat impatient and anxious wanting changes to be made quickly. The answer to the question is the [name] is to treat the symptoms  of his tongue movements.  If he stops the  [name] the tongue movements will be worse.  The [name] is not causing the tongue movements.  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 He has an appointment with me in 3 weeks.  I do not want to make any med changes over the phone or even med adjustments.  I would urge him to come into the office at that visit.  I do not do not want a televisit.  I want to be able to see him so that I can observe his tongue movements.  03/26/2021 appointment with following noted: Taking [name] 18 mg BID. Claims compliance. Started to feel more fatigued and went to See Dr. Antony Contras 02/25/21 with normal labs including: Comprehensive metabolic panel normal except sodium 132, normal CBC, TSH 2.24, B12 538 normal Fatigue is some better 20%.  Wants to get back in bed after breakfast.  For unclear reasons hydroxyzine raised again to 25 mg nightly. Some initial insomnia and some awakening.  Nap 1 1/2 hour in AM and another hour in afternoon. Goes to bed 730PM and up 8 AM. Tongue movements persisted until 2 weeks ago and has gotten better. Reduced sertraline to 50 mg and anxiety is no worse. Not depressed. Reads about sE [name] and wonders if it is causing fatigue.  Plan: To address fatigue and cognition: Reduce hydroxyzine again to 10 mg and try to stop it. Stop hydroxyzine 25 mg at night Start hydroxyzine 10 mg tablets 1 at night Start Dayvigo 5 mg tablet 1 at night using samples.  If it helps then stop the hydroxyzine to help your fatigue and memory Spending way too much time in bed.  03/31/2021 phone call: Jarold Motto is not covered until Belsomra is tried.  Patient was to pick up samples of Belsomra. 04/02/2021 phone call: Complaining of too sleepy on trazodone 50 mg nightly.  Suggested they try half of tablet and if is still a problem stop it. 04/29/2021 phone call with questions and concerns about the [name].  Wife saying that he had problems with rigidity  in his arms and hands. MD response: The patient and his wife call fairly often with complaints that very between excessive tongue movements associated with tardive dyskinesia and on the alternative side the sort of complaints that could be related to parkinsonian side effects of [name].  So if he takes too little [name] his tongue movements will get worse but if he takes too much he may shuffle more than usual.  Even at his baseline before he took [name] he tended to shuffle his feet and have a reduced arm swing.  He is currently on 12 mg [name] 2 tablets twice daily.  If we reduce it we should reduce it only slightly to 12+9 mg tablets 1 of each twice daily.  My advice is that we not make the any med change until his next appointment.  At his last appointment he stated the tongue movements had only recently gotten better and if we reduce the dose they may get worse again.  06/25/2021 appointment with the following noted: He reduced [name] on his own to 1 and 1/4 of 9 mg tablets about a week ago bc sleepiness, rigidity of arms and legs and fatigue partly better.  Tongue movements have come back a little. No sleep meds needed.  Tired and lays in bed a lot. Minimal anxiety and depression. Has stopped hydroxyzine. Started OTC TremorSmoothe prn. Wants to come off [name] bc sx noted.   Always tended to sleep a lot.  Occ recurring disturbing dream. Plan: To address fatigue, sleepiness and cognition: After another week of [name] 9 mg tablets 1 and 1/4 tablets twice daily he can reduce it to 1 tablet twice daily but risk return of TD coming off of this as he wants to do. TD is managed.  09/25/2021 appointment with the following noted: He reduced [name] from 18 mg BID to 13.5 mg BID and feels it's better with less general tension in his body.  Lately energy better but not right after reducing the med. No worsening of tongue movements which come and go. Depression wife says the weakness he feels is  psychological bc likes to sleep a lot.  Not sure how long it's been going on.  To bed 7:30 and gets up about 830.  Walks and then back to bed until about 1130.  Sleeps some.  Chronic tiredness.   Still takes Zoloft 50.   Nervousness is there in his body but not worrying about anything.    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  Son [name] living with them.  Become more committed [place].  He has schizophrenia.   Past Psychiatric Medication Trials: Mirtazapine,  olanzapine 5 EPS, perphenazine started TD with mouth px,  Saphris 5 mg and stopped due to EPS complaints and feeling "funny" Caplyta side effects risperidone 2 tremors  Buspirone,  amitriptyline 25,  sertraline 50 Hydroxyzine 10-20 Clonazepam 0.5 TID dizzy and sleepy Ativan 0.5 mg QID [place] and  [name] 18 mg BID  Review of Systems:  Review of Systems  Constitutional:  Positive for fatigue. Negative for appetite change.  Gastrointestinal:  Negative for constipation.  Neurological:  Negative for dizziness, tremors, syncope and weakness.       No falls But complain of poor balance but not "bad"  Psychiatric/Behavioral:  Positive for decreased concentration. Negative for agitation, behavioral problems, confusion, dysphoric mood, hallucinations, self-injury and sleep disturbance. The patient is not nervous/anxious and is not hyperactive.        Sleepy CABG 07/19/20  Medications: I have reviewed the patient's current medications.  Current Outpatient Medications  Medication Sig Dispense Refill   acetaminophen (TYLENOL) 500 MG tablet Take 500 mg by mouth daily.     buPROPion ER (WELLBUTRIN SR) 100 MG 12 hr tablet Take 1 tablet (100 mg total) by mouth every morning. 30 tablet 1   clopidogrel (PLAVIX) 75 MG tablet Take 1 tablet (75 mg total) by mouth daily. 90 tablet 3   Multiple Vitamin (MULTIVITAMIN WITH MINERALS) TABS tablet Take 2 tablets by mouth daily. 50 +     rosuvastatin (CRESTOR) 40 MG tablet Take 1 tablet (40 mg  total) by mouth daily. 90 tablet 3   sennosides-docusate sodium (SENOKOT-S) 8.6-50 MG tablet Take 1 tablet by mouth daily with supper.     sertraline (ZOLOFT) 50 MG tablet Take 1 tablet (50 mg total) by mouth daily. 90 tablet 1   silodosin (RAPAFLO) 4 MG CAPS capsule Take 4 mg by mouth daily with breakfast.     VITAMIN D PO Take 1 capsule by mouth daily.     Deutetrabenazine ([name]) 9 MG TABS 1 and 1/2 tab in am and 1 and 1/2 tab pm 90 tablet 2   metoprolol tartrate (LOPRESSOR) 25 MG tablet Take 1 tablet (25 mg total) by mouth 2 (two) times daily. (Patient not taking: Reported on 09/25/2021) 180 tablet 3   No current facility-administered medications for this visit.    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   LIMA to LAD, Sequential SVG to OM1-OM3, SVG to RCA, EVH via left thigh   Schizophrenia ([place])    Severe aortic stenosis     Family History  Problem Relation Age of Onset   Hypertension Mother    Heart attack Father    Hypertension Sister    Hypertension Brother     Social History   Socioeconomic History   Marital status: Married    Spouse name: Not on file   Number of children: Not on file   Years of education: Not on file   Highest education level: Not on file  Occupational History   Not on file  Tobacco Use   Smoking status: Never   Smokeless tobacco: Never  Vaping Use   Vaping Use: Never used  Substance and Sexual Activity   Alcohol use: No   Drug  use: Never   Sexual activity: Not on file  Other Topics Concern   Not on file  Social History Narrative   Not on file   Social Determinants of Health   Financial Resource Strain: Not on file  Food Insecurity: Not on file  Transportation Needs: Not on file  Physical Activity: Not on file  Stress: Not on file  Social Connections: Not on file  Intimate Partner Violence: Not on file    Past Medical History, Surgical history, Social history, and Family history were reviewed and updated as appropriate.   Please see review of systems for further details on the patient's review from today.   Objective:   Physical Exam:  There were no vitals taken for this visit.  Physical Exam Neurological:     Mental Status: He is alert and oriented to person, place, and time. Mental status is at baseline.     Cranial Nerves: No dysarthria.     Comments: Mild tongue and buccal movements continuous  Psychiatric:        Attention and Perception: Attention and perception normal. He does not perceive auditory or visual hallucinations.        Mood and Affect: Mood normal. Mood is not anxious or depressed.        Speech: Speech normal.        Behavior: Behavior is slowed. Behavior is cooperative.        Thought Content: Thought content normal. Thought content is not paranoid or delusional. Thought content does not include homicidal or suicidal ideation. Thought content does not include homicidal or suicidal plan.        Cognition and Memory: Cognition normal. He exhibits impaired recent memory.     Comments: Insight and judgment fair.     Lab Review:     Component Value Date/Time   NA 132 (L) 07/24/2020 0400   NA 130 (L) 07/09/2020 1508   K 3.4 (L) 07/24/2020 0400   CL 96 (L) 07/24/2020 0400   CO2 24 07/24/2020 0400  GLUCOSE 115 (H) 07/24/2020 0400   BUN 20 07/24/2020 0400   BUN 16 07/09/2020 1508   CREATININE 0.74 07/24/2020 0400   CALCIUM 7.6 (L) 07/24/2020 0400   PROT 6.6 07/17/2020  0920   PROT 6.6 07/09/2020 1508   ALBUMIN 3.8 07/17/2020 0920   ALBUMIN 4.4 07/09/2020 1508   AST 18 07/17/2020 0920   ALT 22 07/17/2020 0920   ALKPHOS 54 07/17/2020 0920   BILITOT 0.7 07/17/2020 0920   BILITOT 0.4 07/09/2020 1508   GFRNONAA >60 07/24/2020 0400   GFRAA >60 07/24/2020 0400       Component Value Date/Time   WBC 14.0 (H) 07/23/2020 0645   RBC 3.78 (L) 07/23/2020 0645   HGB 11.9 (L) 07/23/2020 0645   HGB 13.5 06/18/2020 1056   HCT 35.1 (L) 07/23/2020 0645   HCT 38.6 06/18/2020 1056   PLT 136 (L) 07/23/2020 0645   PLT 243 06/18/2020 1056   MCV 92.9 07/23/2020 0645   MCV 90 06/18/2020 1056   MCH 31.5 07/23/2020 0645   MCHC 33.9 07/23/2020 0645   RDW 13.7 07/23/2020 0645   RDW 13.0 06/18/2020 1056   LYMPHSABS 1.7 06/18/2020 1056   EOSABS 0.4 06/18/2020 1056   BASOSABS 0.0 06/18/2020 1056   I will  No results found for: POCLITH, LITHIUM   No results found for: PHENYTOIN, PHENOBARB, VALPROATE, [place] Office Visit from 06/04/2020 in [place] Total Score 22        .res Assessment: Plan:    Hagan was seen today for follow-up, schizophrenia, anxiety and medication reaction.  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  Severe recurrent major depression with psychotic features, mood-congruent (HCC) -     buPROPion ER (WELLBUTRIN SR) 100 MG 12 hr tablet; Take 1 tablet (100 mg total) by mouth every morning.  Tardive dyskinesia -     Deutetrabenazine ([name]) 9 MG TABS; 1 and 1/2 tab in am and 1 and 1/2 tab pm  Generalized anxiety disorder  Fatigue, unspecified type -     buPROPion ER (WELLBUTRIN SR) 100 MG 12 hr tablet; Take 1 tablet (100 mg total) by mouth every morning.  Insomnia due to mental  condition   Greater than 50% of 30 min non-face to face time with patient was spent on counseling and coordination of care. We discussed the following.   We discussed Patient has history of severe recurrent major depression with psychotic features.  There are also elements of OCD.   His paranoid is improved.  General anxiety with some intrusive thoughts and Depression resolved.  On the sertraline 50 mg daily.  His son has schizophrenia so there may be a genetic predisposition to psychosis.  I am suspecting that he has paranoid schizophrenia that has been previously diagnosed as depression with psychosis but in either case his sx remitted.        He has benefited with reduction in anxiety from the use of sertraline .  Again his depression is better with the sertraline.  Less shame but that may be mild paranoia or anxiety and not depression. OK with sertraline 50.    Disc SE in detail and SSRI withdrawal sx.  Consider increasing back to 75 mg bcGAD but will  change one thing at a time.  Last visit he wanted to reduce [name] to help with fatigue, sleepiness, cognition and some stiffness he wanted to reduce [name] down to 9 mg twice daily.  We discussed at length walking a tight rope between parkinsonian symptoms if the dose is too high and TD symptoms if the dose is too low.  We also discussed the side effects.  He tends to worry about side effects a good deal.  Discussed his tendency to change his medicines on his own.  There is significant risk in doing so.  His TD has been brought under control by the [name] but he is significantly reduced the dose.  There is a risk of recurrence if he goes too low or if he goes off of it which is his intention.  He was educated that TD tends to risk turn and recur if you stop the [name].   Continue [name] 9 mg tablets 1 and 1/2 tablets twice daily  TD is manageable but not gone  Wellbutrin SR 100 mg in AM for energy and motivation  FU 2 mos   Lynder Parents, MD, DFAPA     Future Appointments  Date Time Provider [place]  10/24/2021  1:45 PM Lendon Colonel, NP [facility]     No orders of the defined types were placed in this encounter.     -------------------------------

## 2021-09-26 ENCOUNTER — Telehealth: Payer: Self-pay | Admitting: Psychiatry

## 2021-09-26 NOTE — Telephone Encounter (Signed)
Spoke to [place] and informed her 50mg  daily

## 2021-09-26 NOTE — Telephone Encounter (Signed)
Next visit is 12/03/21. Neil [name] wife Neil [name] called and wants to verify by a nurse how much [name] is supposed to be taking. Could someone please call her at [phone].

## 2021-10-04 ENCOUNTER — Other Ambulatory Visit: Payer: Self-pay | Admitting: Internal Medicine

## 2021-10-06 ENCOUNTER — Telehealth: Payer: Self-pay | Admitting: Psychiatry

## 2021-10-06 ENCOUNTER — Other Ambulatory Visit: Payer: Self-pay

## 2021-10-06 DIAGNOSIS — G2401 Drug induced subacute dyskinesia: Secondary | ICD-10-CM

## 2021-10-06 MED ORDER — AUSTEDO 12 MG PO TABS: 12.0000 mg | ORAL_TABLET | Freq: Two times a day (BID) | ORAL | 5 refills | Status: DC

## 2021-10-06 NOTE — Telephone Encounter (Signed)
Please review

## 2021-10-06 NOTE — Telephone Encounter (Signed)
Next visit is 12/03/21. Neil [name] with [facility] called regarding Neil [name] for [name]. She states that [name] can't be cut. Neil [name] is currently taking 9 mg and she takes 1-1/2 in am and 1-1/2 in PM. Asking if they should do [name] 12 mg in AM and PM or boost to 15 mg. Her phone number is [phone]

## 2021-10-06 NOTE — Telephone Encounter (Signed)
OK change [name] to 12 mg BID.  Please explain to pt bc he's easily confused, anxious, obsessive.

## 2021-10-06 NOTE — Progress Notes (Signed)
Pharmacy called to report pt can not cut [name] in 1/2, dose changed to [name] 12 mg bid instead. Pt and pharmacy notified.

## 2021-10-06 NOTE — Telephone Encounter (Signed)
Contacted pt and he reports taking [name] 9 mg 1.5 tabs twice a day. Advised him to take 12 mg twice daily when he receives his medication. He verbalized understanding.   Will update Sharyn Lull with information for her to get medication mailed to pt.   Pharmacy reported she had not shipped any medication since July 15 th. But pt reports they haven't ran out.

## 2021-10-08 ENCOUNTER — Other Ambulatory Visit: Payer: Self-pay | Admitting: Internal Medicine

## 2021-10-15 ENCOUNTER — Telehealth: Payer: Self-pay | Admitting: Psychiatry

## 2021-10-15 NOTE — Telephone Encounter (Signed)
Pt's wife, Vaughan Basta called.  She said she was returning Traci's call

## 2021-10-15 NOTE — Telephone Encounter (Signed)
Pt's wife Elmer Sow on VM reporting the Bupropion ER 100 mg 1/d was causing  him to be too hyper and nervous. He stopped taking it. Apt 12/21. Contact # [phone]

## 2021-10-15 NOTE — Telephone Encounter (Signed)
Left Vaughan Basta a message to call back.

## 2021-10-15 NOTE — Telephone Encounter (Signed)
Noted I will give Vaughan Basta a call. It was to help Tiburones with energy and motivation.

## 2021-10-18 ENCOUNTER — Other Ambulatory Visit: Payer: Self-pay | Admitting: Psychiatry

## 2021-10-18 DIAGNOSIS — F333 Major depressive disorder, recurrent, severe with psychotic symptoms: Secondary | ICD-10-CM

## 2021-10-18 DIAGNOSIS — F061 Catatonic disorder due to known physiological condition: Secondary | ICD-10-CM

## 2021-10-18 DIAGNOSIS — R5383 Other fatigue: Secondary | ICD-10-CM

## 2021-10-18 DIAGNOSIS — F251 Schizoaffective disorder, depressive type: Secondary | ICD-10-CM

## 2021-10-21 ENCOUNTER — Telehealth: Payer: Self-pay | Admitting: Psychiatry

## 2021-10-21 NOTE — Telephone Encounter (Signed)
Error

## 2021-10-21 NOTE — Telephone Encounter (Signed)
Pt LVM stating that he wants a call back.  He says the [name] 12 mg is not working.   Next appt 12/21

## 2021-10-21 NOTE — Telephone Encounter (Signed)
LM to RC  

## 2021-10-22 NOTE — Telephone Encounter (Signed)
error 

## 2021-10-22 NOTE — Telephone Encounter (Signed)
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 Wife states it is painful to watch him struggle with the TD and the patient himself would rather have the parkinsonian symptoms than TD.   He is currently taking 12 mg BID. Previously he was taking 1.5 tablets of 9 mg BID. She expressed understanding that there were limited medications available to treat TD.

## 2021-10-23 NOTE — Progress Notes (Signed)
Cardiology Office Note   Date:  10/24/2021   ID:  Neil [name], DOB 1941/05/11, MRN [id number]  PCP:  Antony Contras, MD  Cardiologist: Dr. Debara Pickett. CC: Follow Up    History of Present Illness: Neil [name] is a 80 y.o. male who presents for ongoing assessment and management of severe aortic valve stenosis.  The patient underwent bioprosthetic AVR with Edwards Inspriris Resilia pericardial tissue valve (21 mm) after having cardiac catheterization.  Cardiac catheterization revealed severe multivessel disease and she also underwent CABG x4 during aortic valve repair.  CABG (LIMA to LAD, SVG to distal right coronary, SVG to OM1, and sequential SVG to OM 3). On follow-up with CVTS the patient was found to have moderate left pleural effusion and underwent thoracentesis on the left August 08, 2020 with improvement in symptoms. The patient has other diagnoses of anxiety and depression, dyslipidemia, schizophrenia.  He was last seen in the office by Dr. Debara Pickett on 10/25/2020 and was doing well.  His echocardiogram postoperatively revealed some decline in LV function to 45 to 50%, but he had no anginal symptoms.  It was noted that he could not tolerate lisinopril due to hypotension and was only on metoprolol, Plavix and statin.  Repeat echocardiogram dated 04/30/2021 revealed an LVEF of 50% with grade 2 diastolic dysfunction, mildly reduced RV function, moderate MR AVR and low gradient.  Normal aortic replacement.  He continues to be followed by psychiatry, Dr.Cottle on 09/25/2021 with medication adjustments.   He comes today with his wife without symptoms.  They both have multiple questions about recent echocardiogram which was completed in May 2022 concerning the mitral valve regurgitation and if this is something they should be concerned about.  He also mentions tardive dyskinesia as a side effect of his antipsychotic medications which causes his tongue to be constantly moving.  He states that eating  something helps but he is finding that he is getting up at night eating more things to calm his tongue down as it awakens him.  He asks if this is okay.  He denies chest pain, dyspnea on exertion, dizziness, nausea vomiting, or extensive fatigue.    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   LIMA to LAD, Sequential SVG to OM1-OM3, SVG to RCA, EVH via left thigh   Schizophrenia ([place])    Severe aortic stenosis     Past Surgical History:  Procedure Laterality Date   AORTIC ROOT ENLARGEMENT N/A 07/19/2020   Procedure: AORTIC ROOT ENLARGEMENT USING 6CM x 8CM PERI-GUARD BOVINE PERICARDIUM PATCH;  Surgeon: Rexene Alberts, MD;  Location: [facility];  Service: Open Heart Surgery;  Laterality: N/A;   AORTIC VALVE REPLACEMENT N/A 07/19/2020   Procedure: AORTIC VALVE REPLACEMENT (AVR) USING INSPIRIS RESILIA 21MM AORTIC VALVE;  Surgeon: Rexene Alberts, MD;  Location: [facility];  Service: Open Heart Surgery;  Laterality: N/A;   CORONARY ARTERY BYPASS GRAFT N/A 07/19/2020   Procedure: CORONARY ARTERY BYPASS GRAFTING (CABG), ON PUMP, TIMES FOUR, USING LEFT INTERNAL MAMMARY ARTERY AND ENDOSCOPICALLY HARVESTED LEFT GREATER SAPHENOUS VEIN;  Surgeon: Rexene Alberts, MD;  Location: [facility];  Service: Open Heart Surgery;  Laterality: N/A;   EXPLORATION POST OPERATIVE OPEN HEART N/A 07/19/2020   Procedure:  EXPLORATION POST OPERATIVE OPEN HEART;  Surgeon: Rexene Alberts, MD;  Location: [facility];  Service: Open Heart Surgery;  Laterality: N/A;   EYE SURGERY     Bialteral lower eye lids   FOOT SURGERY Left    IR THORACENTESIS ASP PLEURAL SPACE W/IMG GUIDE  08/08/2020   LIPOMA EXCISION     RIGHT AND LEFT HEART CATH N/A 06/26/2020   Procedure: RIGHT AND LEFT HEART CATH;  Surgeon: Sherren Mocha, MD;  Location: [facility];  Service: Cardiovascular;  Laterality: N/A;   TEE WITHOUT CARDIOVERSION N/A 07/19/2020   Procedure: TRANSESOPHAGEAL ECHOCARDIOGRAM (TEE);  Surgeon: Rexene Alberts, MD;  Location: [facility];  Service: Open Heart Surgery;  Laterality: N/A;     Current Outpatient Medications  Medication Sig Dispense Refill   acetaminophen (TYLENOL) 500 MG tablet Take 500 mg by mouth daily.     Deutetrabenazine ([name]) 9 MG TABS 1-1/2 tablets twice daily. 90 tablet 1   Multiple Vitamin (MULTIVITAMIN WITH MINERALS) TABS tablet Take 2 tablets by mouth daily. 50 +     sennosides-docusate sodium (SENOKOT-S) 8.6-50 MG tablet Take 1 tablet by mouth daily with supper.     sertraline (ZOLOFT) 50 MG tablet Take 1 tablet (50 mg total) by mouth daily. 90 tablet 1   silodosin (RAPAFLO) 4 MG CAPS capsule Take 4 mg by mouth daily with breakfast.     VITAMIN D PO Take 1 capsule by mouth daily.     clopidogrel (PLAVIX) 75 MG tablet Take 1 tablet (75 mg total) by mouth daily. 90 tablet 2   metoprolol tartrate (LOPRESSOR) 25 MG tablet Take 1 tablet (25 mg total) by mouth 2 (two) times daily. 180 tablet 2   rosuvastatin (CRESTOR) 40 MG tablet Take 1 tablet (40 mg total) by mouth daily. 90 tablet 2   No current facility-administered medications for this visit.    Allergies:   Amitriptyline hcl, Aspirin, Elemental sulfur, Perphenazine, Tetanus toxoids, and Tetanus-diphtheria toxoids td    Social History:  The patient  reports that he has never smoked. He has never used smokeless tobacco. He reports that he does not drink alcohol and does not use drugs.   Family History:  The patient's family history includes Heart attack in his father; Hypertension in his brother, mother, and sister.    ROS: All other systems are reviewed and negative. Unless otherwise mentioned in H&P    PHYSICAL EXAM: VS:  BP 120/60 (BP Location: Right Arm)   Pulse (!) 50   Ht 5' 4.5" (1.638 m)   Wt 147 lb 12.8  oz (67 kg)   SpO2 98%   BMI 24.98 kg/m  , BMI Body mass index is 24.98 kg/m. GEN: Well nourished, well developed, in no acute distress HEENT: normal Neck: no JVD, bilateral radiation carotid bruits, no  masses Cardiac: RRR; 1/6 systolic murmur heard at the right sternal border with brisk click, 1/6 systolic murmur heard at the left sternal border and apex, no rubs, or gallops,no edema  Respiratory:  Clear to auscultation bilaterally, normal work of breathing GI: soft, nontender, nondistended, + BS MS: no deformity or atrophy Skin: warm and dry, no rash Neuro:  Strength and sensation are intact Psych: euthymic mood, full affect   EKG:  EKG is ordered today. The ekg ordered today demonstrates (personally reviewed) sinus bradycardia, heart rate of 50 bpm, with intraventricular block noted.   Recent Labs: No results found for requested labs within last 8760 hours.    Lipid  Panel    Component Value Date/Time   CHOL 148 01/23/2021 1049   TRIG 50 01/23/2021 1049   HDL 58 01/23/2021 1049   CHOLHDL 2.6 01/23/2021 1049   LDLCALC 79 01/23/2021 1049      Wt Readings from Last 3 Encounters:  10/24/21 147 lb 12.8 oz (67 kg)  10/25/20 128 lb 9.6 oz (58.3 kg)  09/23/20 123 lb (55.8 kg)      Other studies Reviewed: Echocardiogram 14-May-2021 1. Left ventricular ejection fraction, by estimation, is 50%. The left  ventricle has low normal function. The left ventricle has no regional wall  motion abnormalities. There is mild eccentric left ventricular hypertrophy  of the posterior-lateral  segment. Left ventricular diastolic parameters are consistent with Grade  II diastolic dysfunction (pseudonormalization).   2. Right ventricular systolic function is mildly reduced. The right  ventricular size is normal. There is normal pulmonary artery systolic  pressure. The estimated right ventricular systolic pressure is 86.5 mmHg.   3. Left atrial size was mildly dilated.   4. The mitral valve  is degenerative. Moderate mitral valve regurgitation.  No evidence of mitral stenosis.   5. The aortic valve has been repaired/replaced. Aortic valve  regurgitation is not visualized. There is a 21 mm Edwards Inspiris Resilia  Stented Bovine Pericardial Tissue Valve present in the aortic position.  Procedure Date: 07/19/20. Echo findings are  consistent with normal structure and function of the aortic valve  prosthesis (see findings for measurements). Aortic valve area, by VTI  measures 1.39 cm. Aortic valve mean gradient measures 8.5 mmHg.   6. S/p aortic root enlargement at time of valve replacement (07/19/20).  Aortic root/ascending aorta has been repaired/replaced, and appears  grossly normal where visualized.   7. The inferior vena cava is normal in size with greater than 50%  respiratory variability, suggesting right atrial pressure of 3 mmHg.   Left Heart Cath 06/26/2020   A total occlusion of the RCA with left-to-right collaterals 2.  Moderate ostial left main stenosis estimated at 50% 3.  Severe stenosis of the mid left circumflex/second obtuse marginal 4.  Mild nonobstructive LAD stenosis 5.  Known critical aortic stenosis with a calcified, severely restricted aortic valve on plain fluoroscopy   Recommendations: Continued multidisciplinary heart team evaluation for treatment of critical aortic stenosis and multivessel coronary artery disease.  ASSESSMENT AND PLAN:  1.  CAD: Total occlusion of the RCA with left to right collaterals and moderate ostial left main stenosis at 50%.  Status post CABG.  He currently denies any symptoms chest pain, dyspnea on exertion, or anginal symptoms.  We will continue secondary management with blood pressure control, cholesterol control, and clopidogrel.  2.  Status post aortic valve replacement: Completed during coronary artery bypass grafting in 2021.  Most recent echocardiogram in 5, 2022, reveals grossly normal aortic valve.  I have explained this  to him when going over the echocardiogram.  Repeat echo in 6 months prior to next appointment with Dr. Debara Pickett.  3.  Mitral valve regurgitation: I reviewed his echocardiogram with he and his wife and discussed mitral valve regurgitation.  They verbalized understanding.  Reassurance is given.  3.  Diastolic CHF: Echocardiogram revealed grade 2 diastolic dysfunction.  He has no evidence of volume overload at this time.  He is not on any diuretics.  His weight is gone up but he is eating more.  I have advised him on a low-sodium diet.  4.  Hypertension: He is on metoprolol 25 mg  twice daily with bradycardia noted.  We will have to follow this more closely should he become symptomatic.  Blood pressure is well controlled today at 120/60.  5.  Hypercholesterolemia: Remains on rosuvastatin 40 mg daily.  Goal of LDL less than 70.  Most recent lipid panel on 01/23/2021 LDL 79.  He will be due for follow-up labs in February 2023.  6.  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  He has been eating a lot more to calm the tongue movements down.  He has been gaining weight as a result.  He states that he is put on about 6 pounds.  I have advised him on choosing healthy foods in order to snack on and avoid deep-fried ,high salt, or high sugar foods.  He should consult with the psychiatrist about any medications that he may be offered to help him with this.  Defer to psychiatry.  Current medicines are reviewed at length with the patient today.  I have spent 30 minutes dedicated to the care of this patient on the date of this encounter to include pre-visit review of records, assessment, management and diagnostic testing,with shared decision making.  Labs/ tests ordered today include: Echo in 6 months.   Phill Myron. West Pugh, ANP, AACC   10/24/2021 4:26 PM    [facility] [place] [address] Office [phone] Fax [phone]  Notice: This dictation was prepared with Dragon  dictation along with smaller phrase technology. Any transcriptional errors that result from this process are unintentional and may not be corrected upon review.

## 2021-10-24 ENCOUNTER — Encounter: Payer: Self-pay | Admitting: Adult Health

## 2021-10-24 ENCOUNTER — Other Ambulatory Visit: Payer: Self-pay | Admitting: Psychiatry

## 2021-10-24 ENCOUNTER — Ambulatory Visit: Payer: Medicare HMO | Admitting: Adult Health

## 2021-10-24 ENCOUNTER — Other Ambulatory Visit: Payer: Self-pay

## 2021-10-24 VITALS — BP 120/60 | HR 50 | Ht 64.5 in | Wt 147.8 lb

## 2021-10-24 DIAGNOSIS — E7800 Pure hypercholesterolemia, unspecified: Secondary | ICD-10-CM

## 2021-10-24 DIAGNOSIS — I340 Nonrheumatic mitral (valve) insufficiency: Secondary | ICD-10-CM | POA: Diagnosis not present

## 2021-10-24 DIAGNOSIS — G2401 Drug induced subacute dyskinesia: Secondary | ICD-10-CM

## 2021-10-24 DIAGNOSIS — Z951 Presence of aortocoronary bypass graft: Secondary | ICD-10-CM | POA: Diagnosis not present

## 2021-10-24 DIAGNOSIS — Z953 Presence of xenogenic heart valve: Secondary | ICD-10-CM

## 2021-10-24 DIAGNOSIS — I5032 Chronic diastolic (congestive) heart failure: Secondary | ICD-10-CM | POA: Diagnosis not present

## 2021-10-24 DIAGNOSIS — I2510 Atherosclerotic heart disease of native coronary artery without angina pectoris: Secondary | ICD-10-CM

## 2021-10-24 DIAGNOSIS — I10 Essential (primary) hypertension: Secondary | ICD-10-CM | POA: Diagnosis not present

## 2021-10-24 MED ORDER — AUSTEDO 9 MG PO TABS: ORAL_TABLET | ORAL | 1 refills | Status: DC

## 2021-10-24 MED ORDER — METOPROLOL TARTRATE 25 MG PO TABS: 25.0000 mg | ORAL_TABLET | Freq: Two times a day (BID) | ORAL | 2 refills | Status: DC

## 2021-10-24 MED ORDER — ROSUVASTATIN CALCIUM 40 MG PO TABS: 40.0000 mg | ORAL_TABLET | Freq: Every day | ORAL | 2 refills | Status: DC

## 2021-10-24 MED ORDER — CLOPIDOGREL BISULFATE 75 MG PO TABS: 75.0000 mg | ORAL_TABLET | Freq: Every day | ORAL | 2 refills | Status: DC

## 2021-10-24 NOTE — Progress Notes (Signed)
Patient complained that TD was worse with [name] reduction to 12 mg twice daily and he wants to increase it back to 9 mg tablets 1-1/2 twice daily.  Prescription sent to [facility]

## 2021-10-24 NOTE — Telephone Encounter (Signed)
Patient complained that TD was worse with [name] reduction to 12 mg twice daily and he wants to increase it back to 9 mg tablets 1-1/2 twice daily.  Prescription sent to [facility] Please call and left his wife know.  If they have 9 mg tablets they can go ahead and increase it now.

## 2021-10-24 NOTE — Telephone Encounter (Signed)
Noted. There's not a lower dose of Wellbutrin available so agreed he should just stop it.  No further action needed until the appt.

## 2021-10-24 NOTE — Telephone Encounter (Signed)
LVM to RC 

## 2021-10-24 NOTE — Telephone Encounter (Signed)
Wife returned call and recommendations were given to her. She was concerned about having to cut the pills in half. I told her that she could ask the pharmacy if they would do that for her.

## 2021-10-24 NOTE — Patient Instructions (Signed)
Medication Instructions:  The current medical regimen is effective;  continue present plan and medications as directed. Please refer to the Current Medication list given to you today.  *If you need a refill on your cardiac medications before your next appointment, please call your pharmacy*  Lab Work: NONE  Testing/Procedures: Echocardiogram ([place])- Your physician has requested that you have an echocardiogram. Echocardiography is a painless test that uses sound waves to create images of your heart. It provides your doctor with information about the size and shape of your heart and how well your heart's chambers and valves are working. This procedure takes approximately one hour. There are no restrictions for this procedure. This will be performed at either our [facility] location - [address] [place] location [facility] 2nd floor.  Special Instructions NONE   Follow-Up: Your next appointment:  6 month(s) In Person with Pixie Casino, MD   Please call our office 2 months in advance to schedule this appointment   At [facility], you and your health needs are our priority.  As part of our continuing mission to provide you with exceptional heart care, we have created designated Provider Care Teams.  These Care Teams include your primary Cardiologist (physician) and Advanced Practice Providers (APPs -  Physician Assistants and Nurse Practitioners) who all work together to provide you with the care you need, when you need it.  We recommend signing up for the patient portal called "MyChart".  Sign up information is provided on this After Visit Summary.  MyChart is used to connect with patients for Virtual Visits (Telemedicine).  Patients are able to view lab/test results, encounter notes, upcoming appointments, etc.  Non-urgent messages can be sent to your provider as well.   To learn more about what you can do with MyChart, go to  [email or web address].

## 2021-10-27 ENCOUNTER — Telehealth: Payer: Self-pay | Admitting: Psychiatry

## 2021-10-27 ENCOUNTER — Other Ambulatory Visit: Payer: Self-pay

## 2021-10-27 MED ORDER — AUSTEDO 9 MG PO TABS: ORAL_TABLET | ORAL | 1 refills | Status: DC

## 2021-10-27 NOTE — Progress Notes (Signed)
Changed dosing on pt's [name] to 9 mg tid instead of 1.5 bid.Vaughan Basta (wife) and Sharyn Lull at [facility] aware.

## 2021-10-27 NOTE — Telephone Encounter (Signed)
Left voice mail to call back 

## 2021-10-27 NOTE — Telephone Encounter (Signed)
Pt's wife, Vaughan Basta called wanting the nurse to call her back about Aydien' medication, [name].   Next appt 12/21

## 2021-12-03 ENCOUNTER — Ambulatory Visit (INDEPENDENT_AMBULATORY_CARE_PROVIDER_SITE_OTHER): Payer: Medicare HMO | Admitting: Psychiatry

## 2021-12-03 ENCOUNTER — Encounter: Payer: Self-pay | Admitting: Psychiatry

## 2021-12-03 DIAGNOSIS — R5383 Other fatigue: Secondary | ICD-10-CM

## 2021-12-03 DIAGNOSIS — G2401 Drug induced subacute dyskinesia: Secondary | ICD-10-CM

## 2021-12-03 DIAGNOSIS — F5105 Insomnia due to other mental disorder: Secondary | ICD-10-CM

## 2021-12-03 DIAGNOSIS — F411 Generalized anxiety disorder: Secondary | ICD-10-CM | POA: Diagnosis not present

## 2021-12-03 DIAGNOSIS — F333 Major depressive disorder, recurrent, severe with psychotic symptoms: Secondary | ICD-10-CM

## 2021-12-03 DIAGNOSIS — F061 Catatonic disorder due to known physiological condition: Secondary | ICD-10-CM

## 2021-12-03 DIAGNOSIS — F251 Schizoaffective disorder, depressive type: Secondary | ICD-10-CM | POA: Diagnosis not present

## 2021-12-03 DIAGNOSIS — R69 Illness, unspecified: Secondary | ICD-10-CM | POA: Diagnosis not present

## 2021-12-03 MED ORDER — SERTRALINE HCL 50 MG PO TABS: 75.0000 mg | ORAL_TABLET | Freq: Every day | ORAL | 0 refills | Status: DC

## 2021-12-03 NOTE — Progress Notes (Signed)
Neil [name] [id number] [birth year] 80 y.o.    Subjective:   Patient ID:  Neil [name] is a 80 y.o. (DOB Apr 18, 1941) male.  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 Pertinent negatives include no weakness.  Anxiety Symptoms include decreased concentration and nervous/anxious behavior. Patient reports no confusion, dizziness or palpitations.    Depression        Associated symptoms include decreased concentration and fatigue.  Associated symptoms include no appetite change.  Past medical history includes anxiety.    Neil [name] presents today for follow-up of psychosis, depression, and anxiety.  He has required an urgent appointment because of wanting further med changes and frequent phone calls with med changes since he was last here  When seen September 3 he was switched off olanzapine because of EPS and switched on to Saphris which was raised to 5 mg nightly but only took that dose 1 time.  The rest of the time he took only 2.5 mg nightly.   He called back 5 days later stating he wanted to stop it because it was causing him to hold his mouth open and he was having a worsening time with tongue movements and felt "funny".  He was encouraged to give it more time but he would not agree to do so.  He stopped it Friday 9/11 and still felt the funny tense feeling.    When  seen September 01, 2019.  The decision was made to switch from [place] to [place] because of difficulty getting an adequate response and limited side effects from [place].  We also discussed the antipsychotic and the plan was to start [place] but those samples were not available so he decided to restart Saphris 2.5 mg nightly.  At the visit September 15, 2019 [name] was  increased to 12 mg twice daily, Saphris was discontinued and Caplyta 42 mg daily was started because of his low EPS risk.  He still needed an antipsychotic.  He continued on sertraline 75 mg daily because he felt it helped his depression.  Wife called 10/8 and said the following: On Caplyta,  Thinks it is too strong.  His movements are slow, sleeping too much, drowsy during day ( doesn't really wake up until 4pm after sleeping all night). Takes him along time to think and respond. Had trouble with mouth and jaw before, and now he is holding his mouth open all the time.  Paranoia is better though. Was told to take it 3 days weekly.    October 16 [name] increased to 18 mg BID  Called again November 9 stating he couldn't tolerate either med.  He stopped the Caplyta and reduced the [name] to 9 mg BID a few days ago.  CO rigidity in lower back and arms.  Still bothered by constant tongue movements.  No change in worry, mood but paranoia is reduced since Caplyta.   visit October 25, 2019.  He had stopped Caplyta.  He had also reduced [name] to 9 mg twice daily on his own.  He has been making frequent med changes [place] which is made it extremely difficult to get an antipsychotic to deal with his psychotic symptoms as well as adjust the [name] today with tardive dyskinesia symptoms.  He is very impatient with med changes.  Therefore we did not start a new antipsychotic at the last visit.  He tends to make frequent phone calls and did call again on November 23 complaining of feeling very restless and difficulty sleeping.  Was prescribed clonazepam 0.5 mg twice daily.  He called back the next day stating he wanted to stop the [name] due to restlessness but he had never taken the prescribed clonazepam.  Using courage to follow the treatment plan and take the clonazepam.  He called again the next day stating he still wanted to stop [name] and the clonazepam was making his gait unsteady.  He  was informed that if he stopped the [name] his mouth movements would get worse which is been a leading complaint but he could stop it if he wanted to.  December 4 he reports clonazepam has helped the anxiety and less guilt and less rigidity.  Taking TID and only SE is a little slower movements.  1 fall, slipped on Ba floor where water was lying.  No injury.   Gained 3# and better appetite for the first time in a long time.  CO tongue movements.  Restlessness is now bearable.  Less anxiety overall.  No AH.    visit December 4.  His psychosis was not severe and the decision was made not to start another antipsychotic but instead to try to optimize treatment for tardive dyskinesia and [name] was increased to 12 mg twice daily. He called back immediately after the head visit admitting he had given false information during the visit and that he not actually been taking the [name] in the way he told me during the visit making it necessary to change the [name] to 9 mg twice daily.  He was confronted about his noncompliance and warned that he may be discharged from the practice if he continued to be misleading and noncompliant with the physician.  This is not only compromising care but it is increasing his own risk because he is giving false information.   visit December 21, 2019.  Because of his complaints about balance issues clonazepam was reduced and spread out to 0.25 mg 4 times daily.  Also for tardive dyskinesia symptoms [name] was increased to 12 mg twice daily.  Prior to that visit he admitted being generally noncompliant with the [name] making it difficult to judge what dose to prescribe.  He was continued on sertraline 75 mg daily as he felt that was helpful for his anxiety. Says he's compliant with med changes last visit.  Tongue movements are better but not gone. No problems with the [name].  Stiffness comes and goes and located in head and eyes.  Denies it's vertigo.  Intensity is less.  If shuts  his eyes it goes right away.  visit January 04, 2020.  [name] remained 12 mg twice daily and sertraline 75 mg daily.  The following was changed: switch to lorazepam bc tiredness and balance problems to Ativan. Yes DC clonazepam and start lorazepam 0.5 mg 4 times daily  seen February 8 the patient reports the following.  Yes it did help.  Not as slowed and balance is good now.  Still feels tired with tendency to want to lay down and knows he needs to work on this.  Anxiety is pretty fair.  We decided to reduce the lorazepam to 0.5 mg 3 times daily in hopes of improving energy and balance further.  No other meds were changed.  He is  still not on an antipsychotic.  Anxiety comes and goes but sometimes able to get by with TID lorazepam and energy is better.  Wife says sometimes gets uptight and can't relax so needs QID.Pt reports that mood is anxiety aand not much depression and describes anxiety as worry. Anxiety symptoms include: worry not panic and moderate.   Sleep is better usually with hydroxyzine and sometimes lorazepam. Pt reports that appetite is Much better with weight up to 131# as low as 120#.  Normal was 140#.Marland Kitchen Pt reports that energy is better and OK. Concentration is fair.. Suicidal thoughts:  None. Denies sig paranoid thought.  Plan: No med change except okay to use hydroxyzine 25 mg nightly for insomnia.  Per usual the patient tends to make multiple phone calls between appointments.  He is called about 7 times since March.  06/04/2020 appointment with the following noted: Seen with his wife. Reports hydroxyzine 25 mg is not helpful for sleep. Still moving tongue. No SE meds currently. Not depressed.  Is anxious persistent in the back ground and not even connected to thoughts. Little intrusive thougts.   Sleep concerns, to bed 830 an not sure when goes to sleep.  Sometimes can't go to sleep. Get OOB about 9 AM.  Usually goes  back to bed.  CO weakness for 18 mos. Needs Aortic valve  replacement. He tried reducing lorazepam to 0.5 mg TID and had a lot of restlessness but some improvement in energy.  Wonders if fatigue related to meds or heart valve problem.  Probably will have heart surgery. Plan: Try to reduce lorazepam to 3 and 1/2 tablets daily.  to see if energy is better.  Keep the lowest effective dose possible.   Do not exceed 4 Ativan a day. No new antipsychotics started today. Increase [name] for 1 week to 1 of 6 mg and 1 of the 9 mg tablets twice daily for 1 week, Then increase again to 12 mg tablets 1 and 1/2 tablets twice daily or 18 mg twice daily. Continue sertraline 75 mg daily for anxiety.  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  Recovering slowly.  Just started walking a week or 2 ago. Tor Netters handles the meds. [name] doesn't control tongue movements but it helps. Done fine without lorazepam and less drowsy per wife. Taking hydroxyzine 25 mg for sleep with Tylenol.  Sleeping well per wife.   Wife thinks overall he's doing well from mental health perspective and seems calmer.  Pt says the psychosis has lightened up and he's grateful for that.  No depression.   Wants to try to stop hydroxyzine. Wants to try to reduce sertraline to 50 mg to prevent cutting.   Plan: He wants to reduce sertraline to 50 daily.  He says he's less depressed and it has helped his anxiety. Disc the risk of increased depression and anxiety but since 3 mos out from surgery OK. DC hydroxyzine if possible.  11/05/2020 patient called for refill of hydroxyzine and was taking 12.5 mg.  Dosage reduced to 10 mg.  02/14/2021 phone call from his wife Vaughan Basta concerned about his tongue movements and his anxiety. MD response: The patient and his wife are elderly.  He seems to forget the purpose of the medication and have some difficulty comprehending the conditions for which he is treated as well as the purpose of the meds and side effect issues. He is also  somewhat impatient and anxious wanting changes to be made quickly. The  answer to the question is the [name] is to treat the symptoms of his tongue movements.  If he stops the [name] the tongue movements will be worse.  The [name] is not causing the tongue movements.  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 He has an appointment with me in 3 weeks.  I do not want to make any med changes over the phone or even med adjustments.  I would urge him to come into the office at that visit.  I do not do not want a televisit.  I want to be able to see him so that I can observe his tongue movements.  03/26/2021 appointment with following noted: Taking [name] 18 mg BID. Claims compliance. Started to feel more fatigued and went to See Dr. Antony Contras 02/25/21 with normal labs including: Comprehensive metabolic panel normal except sodium 132, normal CBC, TSH 2.24, B12 538 normal Fatigue is some better 20%.  Wants to get back in bed after breakfast.  For unclear reasons hydroxyzine raised again to 25 mg nightly. Some initial insomnia and some awakening.  Nap 1 1/2 hour in AM and another hour in afternoon. Goes to bed 730PM and up 8 AM. Tongue movements persisted until 2 weeks ago and has gotten better. Reduced sertraline to 50 mg and anxiety is no worse. Not depressed. Reads about sE [name] and wonders if it is causing fatigue.  Plan: To address fatigue and cognition: Reduce hydroxyzine again to 10 mg and try to stop it. Stop hydroxyzine 25 mg at night Start hydroxyzine 10 mg tablets 1 at night Start Dayvigo 5 mg tablet 1 at night using samples.  If it helps then stop the hydroxyzine to help your fatigue and memory Spending way too much time in bed.  03/31/2021 phone call: Jarold Motto is not covered until Belsomra is tried.  Patient was to pick up samples of Belsomra. 04/02/2021 phone call: Complaining of too sleepy on trazodone 50 mg nightly.  Suggested they try half of tablet and if is still a  problem stop it. 04/29/2021 phone call with questions and concerns about the [name].  Wife saying that he had problems with rigidity in his arms and hands. MD response: The patient and his wife call fairly often with complaints that very between excessive tongue movements associated with tardive dyskinesia and on the alternative side the sort of complaints that could be related to parkinsonian side effects of [name].  So if he takes too little [name] his tongue movements will get worse but if he takes too much he may shuffle more than usual.  Even at his baseline before he took [name] he tended to shuffle his feet and have a reduced arm swing.  He is currently on 12 mg [name] 2 tablets twice daily.  If we reduce it we should reduce it only slightly to 12+9 mg tablets 1 of each twice daily.  My advice is that we not make the any med change until his next appointment.  At his last appointment he stated the tongue movements had only recently gotten better and if we reduce the dose they may get worse again.  06/25/2021 appointment with the following noted: He reduced [name] on his own to 1 and 1/4 of 9 mg tablets about a week ago bc sleepiness, rigidity of arms and legs and fatigue partly better.  Tongue movements have come back a little. No sleep meds needed.  Tired and lays in bed a lot. Minimal anxiety and depression. Has stopped  hydroxyzine. Started OTC TremorSmoothe prn. Wants to come off [name] bc sx noted.   Always tended to sleep a lot. Occ recurring disturbing dream. Plan: To address fatigue, sleepiness and cognition: After another week of [name] 9 mg tablets 1 and 1/4 tablets twice daily he can reduce it to 1 tablet twice daily but risk return of TD coming off of this as he wants to do. TD is managed.  09/25/2021 appointment with the following noted: He reduced [name] from 18 mg BID to 13.5 mg BID and feels it's better with less general tension in his body.  Lately energy better but  not right after reducing the med. No worsening of tongue movements which come and go. Depression wife says the weakness he feels is psychological bc likes to sleep a lot.  Not sure how long it's been going on.  To bed 7:30 and gets up about 830.  Walks and then back to bed until about 1130.  Sleeps some.  Chronic tiredness.   Still takes Zoloft 50.   Nervousness is there in his body but not worrying about anything.   pLan no med changes  10/06/2021 phone call asking to increase [name] to 12 mg twice daily.  That dosage was changed as requested.  10/15/2021 phone call from patient's wife Vaughan Basta stating bupropion ER 100 mg every morning made him too hyper and nervous and he stopped it.  10/21/2021 phone call from wife stating with the change in [name] dose the wife thinks his TD symptoms are worse.  On 12 mg twice daily versus 9 mg tablets 1-1/2 twice daily.  So the 9 mg dose was sent back in.  12/03/2021 appointment with the following noted: Taking [name] 9 mg TID, sertraline 50, no Wellbutrin. Fair.  Tiredness not as pronounced as it was.  Not sure why. Not sure about depression, I have something.  Can't describe it but not worrying a lot.  Has had a number of stressors.  Nerves on edge at times.  Trouble resting but still sleeps a lot.  It comes and goes.  Sleeps 10 hours. Not that productive and doesn't do much.  Enjoyed son and his family for Christmas. TD better not gone.  Worse under stress.   Not going to church DT fear of Covid and history of heart operation.  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  Son Neil [name] living with them.  Become more committed [place].  He has schizophrenia.   Past Psychiatric Medication Trials: Mirtazapine,  olanzapine 5 EPS, perphenazine started TD with mouth px,  Saphris 5 mg and stopped due to EPS complaints and feeling "funny" Caplyta side effects risperidone 2 tremors  Buspirone,   amitriptyline 25,  sertraline 50 Wellbutrin SR 100  nervous Hydroxyzine 10-20 Clonazepam 0.5 TID dizzy and sleepy Ativan 0.5 mg QID Ingrezza and  [name] 18 mg BID  Review of Systems:  Review of Systems  Constitutional:  Positive for fatigue. Negative for appetite change.  Cardiovascular:  Negative for palpitations.  Gastrointestinal:  Negative for constipation.  Neurological:  Negative for dizziness, tremors, syncope and weakness.       No falls But complain of poor balance but not "bad"  Psychiatric/Behavioral:  Positive for decreased concentration. Negative for agitation, behavioral problems, confusion, dysphoric mood, hallucinations, self-injury and sleep disturbance. The patient is nervous/anxious. The patient is not hyperactive.        Sleepy CABG 07/19/20  Medications: I have reviewed the patient's current medications.  Current Outpatient Medications  Medication  Sig Dispense Refill   acetaminophen (TYLENOL) 500 MG tablet Take 500 mg by mouth daily.     Deutetrabenazine ([name]) 9 MG TABS 1 tablet (9 mg) three times a day 90 tablet 1   Multiple Vitamin (MULTIVITAMIN WITH MINERALS) TABS tablet Take 2 tablets by mouth daily. 50 +     rosuvastatin (CRESTOR) 40 MG tablet Take 1 tablet (40 mg total) by mouth daily. 90 tablet 2   sertraline (ZOLOFT) 50 MG tablet Take 1 tablet (50 mg total) by mouth daily. 90 tablet 1   VITAMIN D PO Take 1 capsule by mouth daily.     clopidogrel (PLAVIX) 75 MG tablet Take 1 tablet (75 mg total) by mouth daily. 90 tablet 2   metoprolol tartrate (LOPRESSOR) 25 MG tablet Take 1 tablet (25 mg total) by mouth 2 (two) times daily. (Patient not taking: Reported on 12/03/2021) 180 tablet 2   sennosides-docusate sodium (SENOKOT-S) 8.6-50 MG tablet Take 1 tablet by mouth daily with supper.     silodosin (RAPAFLO) 4 MG CAPS capsule Take 4 mg by mouth daily with breakfast.     No current facility-administered medications for this visit.    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   LIMA to LAD, Sequential SVG to OM1-OM3, SVG to RCA, EVH via left thigh   Schizophrenia ([place])    Severe aortic stenosis     Family History  Problem Relation Age of Onset   Hypertension Mother    Heart attack Father    Hypertension Sister    Hypertension Brother     Social History   Socioeconomic History   Marital status: Married    Spouse name: Not on file   Number of children: Not on file   Years of education: Not on file   Highest education level: Not on file  Occupational History   Not on file  Tobacco Use   Smoking status: Never   Smokeless tobacco: Never  Vaping Use   Vaping Use: Never used  Substance and Sexual Activity   Alcohol use: No   Drug use: Never   Sexual activity: Not on file  Other Topics Concern   Not on file  Social History Narrative   Not on file   Social Determinants of Health   Financial Resource Strain: Not on file  Food Insecurity: Not on file  Transportation Needs: Not on file  Physical Activity: Not on file  Stress: Not on file  Social Connections: Not on file  Intimate Partner Violence: Not on file    Past Medical History, Surgical history, Social history, and Family history were reviewed and updated as appropriate.   Please see review of systems for further details on the patient's review from today.   Objective:  Physical Exam:  There were no vitals taken for this visit.  Physical Exam Neurological:     Mental Status: He is alert and oriented to person, place, and time. Mental status is at baseline.     Cranial Nerves: No dysarthria.     Comments: Mild tongue and buccal movements continuous  Psychiatric:        Attention and Perception: Attention and perception normal. He does not perceive auditory or visual hallucinations.        Mood and Affect: Mood is anxious. Mood is not depressed.        Speech: Speech normal.        Behavior: Behavior is slowed. Behavior is cooperative.        Thought Content: Thought content normal. Thought content is not paranoid or delusional. Thought content does not include homicidal or suicidal ideation. Thought content does not include homicidal or suicidal plan.        Cognition and Memory: Cognition normal. He exhibits impaired recent memory.     Comments: Insight and judgment fair.     Lab Review:     Component Value Date/Time   NA 132 (L) 07/24/2020 0400   NA 130 (L) 07/09/2020 1508   K 3.4 (L) 07/24/2020 0400   CL 96 (L) 07/24/2020 0400   CO2 24 07/24/2020 0400   GLUCOSE 115 (H) 07/24/2020 0400   BUN 20 07/24/2020 0400   BUN 16 07/09/2020 1508   CREATININE 0.74 07/24/2020 0400   CALCIUM 7.6 (L) 07/24/2020 0400   PROT 6.6 07/17/2020 0920   PROT 6.6 07/09/2020 1508   ALBUMIN 3.8 07/17/2020 0920   ALBUMIN 4.4 07/09/2020 1508   AST 18 07/17/2020 0920   ALT 22 07/17/2020 0920   ALKPHOS 54 07/17/2020 0920   BILITOT 0.7 07/17/2020 0920   BILITOT 0.4 07/09/2020 1508   GFRNONAA >60 07/24/2020 0400   GFRAA >60 07/24/2020 0400       Component Value Date/Time   WBC 14.0 (H) 07/23/2020 0645   RBC 3.78 (L) 07/23/2020 0645   HGB 11.9 (L) 07/23/2020 0645   HGB 13.5 06/18/2020 1056   HCT 35.1 (L) 07/23/2020 0645   HCT 38.6 06/18/2020 1056   PLT 136 (L) 07/23/2020 0645   PLT 243 06/18/2020 1056   MCV 92.9 07/23/2020 0645   MCV 90 06/18/2020 1056    MCH 31.5 07/23/2020 0645   MCHC 33.9 07/23/2020 0645   RDW 13.7 07/23/2020 0645   RDW 13.0 06/18/2020 1056   LYMPHSABS 1.7 06/18/2020 1056   EOSABS 0.4 06/18/2020 1056   BASOSABS 0.0 06/18/2020 1056   I will  No results found for: POCLITH, LITHIUM   No results found for: PHENYTOIN, PHENOBARB, VALPROATE, [place] Office Visit from 06/04/2020 in [place] Total Score 22        .res Assessment: Plan:    Seaver was seen today for follow-up, medication problem, fatigue, depression and anxiety.  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 We discussed the following.   We discussed Patient has history of severe recurrent major depression with  psychotic features.  There are also elements of OCD.   His paranoid is improved.  General anxiety with some intrusive thoughts and Depression resolved.  On the sertraline 50 mg daily.  His son has schizophrenia so there may be a genetic predisposition to psychosis.  I am suspecting that he has paranoid schizophrenia that has been previously diagnosed as depression with psychosis but in either case his sx remitted.        He has benefited with reduction in anxiety from the use of sertraline .  Again his depression is better with the sertraline.  Less shame but that may be mild paranoia or anxiety and not depression. Increase  sertraline to 75 mg bc of "nerves on edge".    Disc SE in detail and SSRI withdrawal sx.  Consider increasing back to 75 mg bcGAD but will change one thing at a  time.  Last visit he wanted to reduce [name] to help with fatigue, sleepiness, cognition and some stiffness he wanted to reduce [name] down to 9 mg twice daily.  We discussed at length walking a tight rope between parkinsonian symptoms if the dose is too high and TD symptoms if the dose is too low.  We also discussed the side effects.  He tends to worry about side effects a good deal.  Discussed his tendency to change his medicines on his own.  There is significant risk in doing so.  His TD has been brought under control by the [name] but he is significantly reduced the dose.  There is a risk of recurrence if he goes too low or if he goes off of it which is his intention.  He was educated that TD tends to risk turn and recur if you stop the [name].   Continue [name] 9 mg tablets 1 and 1/2 tablets twice daily  TD is manageable but not gone  FU 2 mos prefer in person  Lynder Parents, MD, DFAPA     Future Appointments  Date Time Provider Burney  03/25/2022  2:05 PM [id number]     No orders of the defined types were placed in this encounter.      -------------------------------

## 2021-12-16 ENCOUNTER — Telehealth: Payer: Self-pay | Admitting: Psychiatry

## 2021-12-16 ENCOUNTER — Other Ambulatory Visit: Payer: Self-pay | Admitting: Psychiatry

## 2021-12-16 NOTE — Telephone Encounter (Signed)
Pt's wife called to let Dr. Clovis Pu know since the pt changed to 1.5 tablets daily of Sertraline, he is doing very well.  Next appt 2/27

## 2021-12-16 NOTE — Telephone Encounter (Signed)
Noted. Thanks.

## 2022-01-22 DIAGNOSIS — Q825 Congenital non-neoplastic nevus: Secondary | ICD-10-CM | POA: Diagnosis not present

## 2022-01-22 DIAGNOSIS — L738 Other specified follicular disorders: Secondary | ICD-10-CM | POA: Diagnosis not present

## 2022-01-22 DIAGNOSIS — Z85828 Personal history of other malignant neoplasm of skin: Secondary | ICD-10-CM | POA: Diagnosis not present

## 2022-02-09 ENCOUNTER — Other Ambulatory Visit: Payer: Self-pay

## 2022-02-09 ENCOUNTER — Encounter: Payer: Self-pay | Admitting: Psychiatry

## 2022-02-09 ENCOUNTER — Ambulatory Visit: Payer: Medicare HMO | Admitting: Psychiatry

## 2022-02-09 VITALS — Wt 159.0 lb

## 2022-02-09 DIAGNOSIS — F333 Major depressive disorder, recurrent, severe with psychotic symptoms: Secondary | ICD-10-CM

## 2022-02-09 DIAGNOSIS — F251 Schizoaffective disorder, depressive type: Secondary | ICD-10-CM

## 2022-02-09 DIAGNOSIS — F061 Catatonic disorder due to known physiological condition: Secondary | ICD-10-CM

## 2022-02-09 DIAGNOSIS — R69 Illness, unspecified: Secondary | ICD-10-CM | POA: Diagnosis not present

## 2022-02-09 DIAGNOSIS — F5105 Insomnia due to other mental disorder: Secondary | ICD-10-CM

## 2022-02-09 DIAGNOSIS — G2401 Drug induced subacute dyskinesia: Secondary | ICD-10-CM | POA: Diagnosis not present

## 2022-02-09 DIAGNOSIS — F331 Major depressive disorder, recurrent, moderate: Secondary | ICD-10-CM

## 2022-02-09 DIAGNOSIS — F411 Generalized anxiety disorder: Secondary | ICD-10-CM | POA: Diagnosis not present

## 2022-02-09 DIAGNOSIS — R5383 Other fatigue: Secondary | ICD-10-CM

## 2022-02-09 MED ORDER — AUSTEDO 9 MG PO TABS: 1.0000 | ORAL_TABLET | Freq: Three times a day (TID) | ORAL | 5 refills | Status: DC

## 2022-02-09 MED ORDER — SERTRALINE HCL 50 MG PO TABS: 75.0000 mg | ORAL_TABLET | Freq: Every day | ORAL | 0 refills | Status: DC

## 2022-02-09 NOTE — Progress Notes (Signed)
Neil [name] [id number] Jan 05, 1941 81 y.o.    Subjective:   Patient ID:  Neil [name] is a 81 y.o. (DOB [birth year]) male.  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  Anxiety Symptoms include decreased concentration and nervous/anxious behavior. Patient reports no confusion, dizziness or palpitations.    Depression        Associated symptoms include decreased concentration.  Associated symptoms include no fatigue and no appetite change.  Past medical history includes anxiety.    Neil [name] presents today for follow-up of psychosis, depression, and anxiety.  He has required an urgent appointment because of wanting further med changes and frequent phone calls with med changes since he was last here  When seen September 3 he was switched off olanzapine because of EPS and switched on to Saphris which was raised to 5 mg nightly but only took that dose 1 time.  The rest of the time he took only 2.5 mg nightly.   He called back 5 days later stating he wanted to stop it because it was causing him to hold his mouth open and he was having a worsening time with tongue movements and felt "funny".  He was encouraged to give it more time but he would not agree to do so.  He stopped it Friday 9/11 and still felt the funny tense feeling.    When  seen September 01, 2019.  The decision was made to switch from [place] to [place] because of difficulty getting an adequate response and limited side effects from [place].  We also discussed the antipsychotic and the plan was to start [place] but those samples were not available so he decided to restart Saphris 2.5 mg nightly.  At the visit September 15, 2019 [name] was increased to 12 mg twice daily, Saphris was discontinued  and Caplyta 42 mg daily was started because of his low EPS risk.  He still needed an antipsychotic.  He continued on sertraline 75 mg daily because he felt it helped his depression.  Wife called 10/8 and said the following: On Caplyta,  Thinks it is too strong.  His movements are slow, sleeping too much, drowsy during day ( doesn't really wake up until 4pm after sleeping all night). Takes him along time to think and respond. Had trouble with mouth and jaw before, and now he is holding his mouth open all the time.  Paranoia is better though. Was told to take it 3 days weekly.    October 16 [name] increased to 18 mg BID  Called again November 9 stating he couldn't tolerate either med.  He stopped the Caplyta and reduced the [name] to 9 mg BID a few days ago.  CO rigidity in lower back and arms.  Still bothered by constant tongue movements.  No change in worry, mood but paranoia is reduced since Caplyta.   visit October 25, 2019.  He had stopped Caplyta.  He had also reduced [name] to 9 mg twice daily on his own.  He has been making frequent med changes [place] which is made it extremely difficult to get an antipsychotic to deal with his psychotic symptoms as well as adjust the [name] today with tardive dyskinesia symptoms.  He is very impatient with med changes.  Therefore we did not start a new antipsychotic at the last visit.  He tends to make frequent phone calls and did call again on November 23 complaining of feeling very restless and difficulty sleeping.  Was prescribed clonazepam 0.5 mg twice daily.  He called back the next day stating he wanted to stop the [name] due to restlessness but he had never taken the prescribed clonazepam.  Using courage to follow the treatment plan and take the clonazepam.  He called again the next day stating he still wanted to stop [name] and the clonazepam was making his gait unsteady.  He was informed that if he stopped the [name] his mouth  movements would get worse which is been a leading complaint but he could stop it if he wanted to.  December 4 he reports clonazepam has helped the anxiety and less guilt and less rigidity.  Taking TID and only SE is a little slower movements.  1 fall, slipped on Ba floor where water was lying.  No injury.   Gained 3# and better appetite for the first time in a long time.  CO tongue movements.  Restlessness is now bearable.  Less anxiety overall.  No AH.    visit December 4.  His psychosis was not severe and the decision was made not to start another antipsychotic but instead to try to optimize treatment for tardive dyskinesia and [name] was increased to 12 mg twice daily. He called back immediately after the head visit admitting he had given false information during the visit and that he not actually been taking the [name] in the way he told me during the visit making it necessary to change the [name] to 9 mg twice daily.  He was confronted about his noncompliance and warned that he may be discharged from the practice if he continued to be misleading and noncompliant with the physician.  This is not only compromising care but it is increasing his own risk because he is giving false information.   visit December 21, 2019.  Because of his complaints about balance issues clonazepam was reduced and spread out to 0.25 mg 4 times daily.  Also for tardive dyskinesia symptoms [name] was increased to 12 mg twice daily.  Prior to that visit he admitted being generally noncompliant with the [name] making it difficult to judge what dose to prescribe.  He was continued on sertraline 75 mg daily as he felt that was helpful for his anxiety. Says he's compliant with med changes last visit.  Tongue movements are better but not gone. No problems with the [name].  Stiffness comes and goes and located in head and eyes.  Denies it's vertigo.  Intensity is less.  If shuts his eyes it goes right away.  visit January 04, 2020.  [name] remained 12 mg twice daily and sertraline 75 mg daily.  The following was changed: switch to lorazepam bc tiredness and balance problems to Ativan. Yes DC clonazepam and start lorazepam 0.5 mg 4 times daily  seen February 8 the patient reports the following.  Yes it did help.  Not as slowed and balance is good now.  Still feels tired with tendency to want to lay down and knows he needs to work on this.  Anxiety is pretty fair.  We decided to reduce the lorazepam to 0.5 mg 3 times daily in hopes of improving energy and balance further.  No other meds were changed.  He is still not on an antipsychotic.  Anxiety comes  and goes but sometimes able to get by with TID lorazepam and energy is better.  Wife says sometimes gets uptight and can't relax so needs QID.Pt reports that mood is anxiety aand not much depression and describes anxiety as worry. Anxiety symptoms include: worry not panic and moderate.   Sleep is better usually with hydroxyzine and sometimes lorazepam. Pt reports that appetite is Much better with weight up to 131# as low as 120#.  Normal was 140#.Marland Kitchen Pt reports that energy is better and OK. Concentration is fair.. Suicidal thoughts:  None. Denies sig paranoid thought.  Plan: No med change except okay to use hydroxyzine 25 mg nightly for insomnia.  Per usual the patient tends to make multiple phone calls between appointments.  He is called about 7 times since March.  06/04/2020 appointment with the following noted: Seen with his wife. Reports hydroxyzine 25 mg is not helpful for sleep. Still moving tongue. No SE meds currently. Not depressed.  Is anxious persistent in the back ground and not even connected to thoughts. Little intrusive thougts.   Sleep concerns, to bed 830 an not sure when goes to sleep.  Sometimes can't go to sleep. Get OOB about 9 AM.  Usually goes  back to bed.  CO weakness for 18 mos. Needs Aortic valve replacement. He tried reducing lorazepam to 0.5 mg TID  and had a lot of restlessness but some improvement in energy.  Wonders if fatigue related to meds or heart valve problem.  Probably will have heart surgery. Plan: Try to reduce lorazepam to 3 and 1/2 tablets daily.  to see if energy is better.  Keep the lowest effective dose possible.   Do not exceed 4 Ativan a day. No new antipsychotics started today. Increase [name] for 1 week to 1 of 6 mg and 1 of the 9 mg tablets twice daily for 1 week, Then increase again to 12 mg tablets 1 and 1/2 tablets twice daily or 18 mg twice daily. Continue sertraline 75 mg daily for anxiety.  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  Recovering slowly.  Just started walking a week or 2 ago. Tor Netters handles the meds. [name] doesn't control tongue movements but it helps. Done fine without lorazepam and less drowsy per wife. Taking hydroxyzine 25 mg for sleep with Tylenol.  Sleeping well per wife.   Wife thinks overall he's doing well from mental health perspective and seems calmer.  Pt says the psychosis has lightened up and he's grateful for that.  No depression.   Wants to try to stop hydroxyzine. Wants to try to reduce sertraline to 50 mg to prevent cutting.   Plan: He wants to reduce sertraline to 50 daily.  He says he's less depressed and it has helped his anxiety. Disc the risk of increased depression and anxiety but since 3 mos out from surgery OK. DC hydroxyzine if possible.  11/05/2020 patient called for refill of hydroxyzine and was taking 12.5 mg.  Dosage reduced to 10 mg.  02/14/2021 phone call from his wife Vaughan Basta concerned about his tongue movements and his anxiety. MD response: The patient and his wife are elderly.  He seems to forget the purpose of the medication and have some difficulty comprehending the conditions for which he is treated as well as the purpose of the meds and side effect issues. He is also somewhat impatient and anxious wanting changes to be made  quickly. The answer to the question is the Renee Harder is  to treat the symptoms of his tongue movements.  If he stops the [name] the tongue movements will be worse.  The [name] is not causing the tongue movements.  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 He has an appointment with me in 3 weeks.  I do not want to make any med changes over the phone or even med adjustments.  I would urge him to come into the office at that visit.  I do not do not want a televisit.  I want to be able to see him so that I can observe his tongue movements.  03/26/2021 appointment with following noted: Taking [name] 18 mg BID. Claims compliance. Started to feel more fatigued and went to See Dr. Antony Contras 02/25/21 with normal labs including: Comprehensive metabolic panel normal except sodium 132, normal CBC, TSH 2.24, B12 538 normal Fatigue is some better 20%.  Wants to get back in bed after breakfast.  For unclear reasons hydroxyzine raised again to 25 mg nightly. Some initial insomnia and some awakening.  Nap 1 1/2 hour in AM and another hour in afternoon. Goes to bed 730PM and up 8 AM. Tongue movements persisted until 2 weeks ago and has gotten better. Reduced sertraline to 50 mg and anxiety is no worse. Not depressed. Reads about sE [name] and wonders if it is causing fatigue.  Plan: To address fatigue and cognition: Reduce hydroxyzine again to 10 mg and try to stop it. Stop hydroxyzine 25 mg at night Start hydroxyzine 10 mg tablets 1 at night Start Dayvigo 5 mg tablet 1 at night using samples.  If it helps then stop the hydroxyzine to help your fatigue and memory Spending way too much time in bed.  03/31/2021 phone call: Jarold Motto is not covered until Belsomra is tried.  Patient was to pick up samples of Belsomra. 04/02/2021 phone call: Complaining of too sleepy on trazodone 50 mg nightly.  Suggested they try half of tablet and if is still a problem stop it. 04/29/2021 phone call with questions and  concerns about the [name].  Wife saying that he had problems with rigidity in his arms and hands. MD response: The patient and his wife call fairly often with complaints that very between excessive tongue movements associated with tardive dyskinesia and on the alternative side the sort of complaints that could be related to parkinsonian side effects of [name].  So if he takes too little [name] his tongue movements will get worse but if he takes too much he may shuffle more than usual.  Even at his baseline before he took [name] he tended to shuffle his feet and have a reduced arm swing.  He is currently on 12 mg [name] 2 tablets twice daily.  If we reduce it we should reduce it only slightly to 12+9 mg tablets 1 of each twice daily.  My advice is that we not make the any med change until his next appointment.  At his last appointment he stated the tongue movements had only recently gotten better and if we reduce the dose they may get worse again.  06/25/2021 appointment with the following noted: He reduced [name] on his own to 1 and 1/4 of 9 mg tablets about a week ago bc sleepiness, rigidity of arms and legs and fatigue partly better.  Tongue movements have come back a little. No sleep meds needed.  Tired and lays in bed a lot. Minimal anxiety and depression. Has stopped hydroxyzine. Started OTC TremorSmoothe prn. Wants to come  off [name] bc sx noted.   Always tended to sleep a lot. Occ recurring disturbing dream. Plan: To address fatigue, sleepiness and cognition: After another week of [name] 9 mg tablets 1 and 1/4 tablets twice daily he can reduce it to 1 tablet twice daily but risk return of TD coming off of this as he wants to do. TD is managed.  09/25/2021 appointment with the following noted: He reduced [name] from 18 mg BID to 13.5 mg BID and feels it's better with less general tension in his body.  Lately energy better but not right after reducing the med. No worsening of tongue  movements which come and go. Depression wife says the weakness he feels is psychological bc likes to sleep a lot.  Not sure how long it's been going on.  To bed 7:30 and gets up about 830.  Walks and then back to bed until about 1130.  Sleeps some.  Chronic tiredness.   Still takes Zoloft 50.   Nervousness is there in his body but not worrying about anything.   pLan no med changes  10/06/2021 phone call asking to increase [name] to 12 mg twice daily.  That dosage was changed as requested.  10/15/2021 phone call from patient's wife Vaughan Basta stating bupropion ER 100 mg every morning made him too hyper and nervous and he stopped it.  10/21/2021 phone call from wife stating with the change in [name] dose the wife thinks his TD symptoms are worse.  On 12 mg twice daily versus 9 mg tablets 1-1/2 twice daily.  So the 9 mg dose was sent back in.  12/03/2021 appointment with the following noted: Taking [name] 9 mg TID, sertraline 50, no Wellbutrin. Fair.  Tiredness not as pronounced as it was.  Not sure why. Not sure about depression, I have something.  Can't describe it but not worrying a lot.  Has had a number of stressors.  Nerves on edge at times.  Trouble resting but still sleeps a lot.  It comes and goes.  Sleeps 10 hours. Not that productive and doesn't do much.  Enjoyed son and his family for Christmas. TD better not gone.  Worse under stress.   Not going to church DT fear of Covid and history of heart operation. Plan: Increase  sertraline to 75 mg bc of "nerves on edge".    02/09/22 appt noted: Increase apptetite and  wt from 145 to 159#.  In the last couple of months. Less anxiety with nerves less on edge. [name] workig fair but tongue still moves. Depression better not gone. Energy better but fair. Sleep with benadryl 4 nightly.  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  Son [name] living with them.  Become more committed [place].  He has schizophrenia.   Past Psychiatric  Medication Trials: Mirtazapine,  olanzapine 5 EPS, perphenazine started TD with mouth px,  Saphris 5 mg and stopped due to EPS complaints and feeling "funny" Caplyta side effects risperidone 2 tremors  Buspirone,   amitriptyline 25,  sertraline 50 Wellbutrin SR 100 nervous Hydroxyzine 10-20 Clonazepam 0.5 TID dizzy and sleepy Ativan 0.5 mg QID [place] and  [name] 18 mg BID  Review of Systems:  Review of Systems  Constitutional:  Negative for appetite change and fatigue.  Cardiovascular:  Negative for palpitations.  Gastrointestinal:  Negative for constipation.  Neurological:  Negative for dizziness, tremors, syncope and weakness.       No falls But complain of poor balance but not "bad"  Psychiatric/Behavioral:  Positive for decreased concentration. Negative for agitation, behavioral problems, confusion, dysphoric mood, hallucinations, self-injury and sleep disturbance. The patient is nervous/anxious. The patient is not hyperactive.        Sleepy CABG 07/19/20  Medications: I have reviewed the patient's current medications.  Current Outpatient Medications  Medication Sig Dispense Refill   acetaminophen (TYLENOL) 500 MG tablet Take 500 mg by mouth daily.     clopidogrel (PLAVIX) 75 MG tablet Take 1 tablet (75 mg total) by mouth daily. 90 tablet 2   Multiple Vitamin (MULTIVITAMIN WITH MINERALS) TABS tablet Take 2 tablets by mouth daily. 50 +     rosuvastatin (CRESTOR) 40 MG tablet Take 1 tablet (40 mg total) by mouth daily. 90 tablet 2   sennosides-docusate sodium (SENOKOT-S) 8.6-50 MG tablet Take 1 tablet by mouth daily with supper.     silodosin (RAPAFLO) 4 MG CAPS capsule Take 4 mg by mouth daily with breakfast.     VITAMIN D PO Take 1 capsule by mouth daily.     Deutetrabenazine ([name]) 9 MG TABS Take 1 tablet by mouth 3 (three) times daily. 90 tablet 5   metoprolol tartrate (LOPRESSOR) 25 MG tablet Take 1 tablet (25 mg total) by mouth 2 (two) times daily. (Patient not  taking: Reported on 12/03/2021) 180 tablet 2   sertraline (ZOLOFT) 50 MG tablet Take 1.5 tablets (75 mg total) by mouth daily. 135 tablet 0   No current facility-administered medications for this visit.    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   LIMA to LAD, Sequential SVG to OM1-OM3, SVG to RCA, EVH via left thigh   Schizophrenia ([place])    Severe aortic stenosis     Family History  Problem Relation Age of Onset   Hypertension Mother    Heart attack Father    Hypertension Sister    Hypertension Brother     Social History   Socioeconomic History   Marital status: Married    Spouse name: Not on file   Number of children: Not on file   Years of education: Not on file   Highest education level: Not on file  Occupational History   Not on file  Tobacco Use   Smoking status: Never   Smokeless tobacco: Never  Vaping Use   Vaping Use: Never used  Substance and Sexual Activity   Alcohol use: No   Drug use: Never   Sexual activity: Not on file  Other Topics Concern   Not on file  Social History Narrative   Not on file   Social Determinants of Health   Financial Resource Strain: Not on file  Food Insecurity: Not on file  Transportation Needs: Not on file  Physical Activity: Not on file  Stress: Not on file  Social  Connections:  Not on file  Intimate Partner Violence: Not on file    Past Medical History, Surgical history, Social history, and Family history were reviewed and updated as appropriate.   Please see review of systems for further details on the patient's review from today.   Objective:   Physical Exam:  Wt 159 lb (72.1 kg)    BMI 26.87 kg/m   Physical Exam Constitutional:      General: He is not in acute distress. Musculoskeletal:        General: No deformity.  Neurological:     Mental Status: He is alert and oriented to person, place, and time. Mental status is at baseline.     Cranial Nerves: No dysarthria.     Coordination: Coordination normal.     Comments: Mild tongue and buccal movements continuous. Mainly slow opening and closing of mouth and some tongue rolling mild-moderate with less distress from it than in the past  Psychiatric:        Attention and Perception: Attention and perception normal. He does not perceive auditory or visual hallucinations.        Mood and Affect: Mood is anxious. Mood is not depressed. Affect is not labile, blunt, angry or inappropriate.        Speech: Speech normal.        Behavior: Behavior is not slowed. Behavior is cooperative.        Thought Content: Thought content normal. Thought content is not paranoid or delusional. Thought content does not include homicidal or suicidal ideation. Thought content does not include suicidal plan.        Cognition and Memory: Cognition normal. He exhibits impaired recent memory.        Judgment: Judgment normal.     Comments: Insight and judgment fair.     Lab Review:     Component Value Date/Time   NA 132 (L) 07/24/2020 0400   NA 130 (L) 07/09/2020 1508   K 3.4 (L) 07/24/2020 0400   CL 96 (L) 07/24/2020 0400   CO2 24 07/24/2020 0400   GLUCOSE 115 (H) 07/24/2020 0400   BUN 20 07/24/2020 0400   BUN 16 07/09/2020 1508   CREATININE 0.74 07/24/2020 0400   CALCIUM 7.6 (L) 07/24/2020 0400   PROT 6.6 07/17/2020  0920   PROT 6.6 07/09/2020 1508   ALBUMIN 3.8 07/17/2020 0920   ALBUMIN 4.4 07/09/2020 1508   AST 18 07/17/2020 0920   ALT 22 07/17/2020 0920   ALKPHOS 54 07/17/2020 0920   BILITOT 0.7 07/17/2020 0920   BILITOT 0.4 07/09/2020 1508   GFRNONAA >60 07/24/2020 0400   GFRAA >60 07/24/2020 0400       Component Value Date/Time   WBC 14.0 (H) 07/23/2020 0645   RBC 3.78 (L) 07/23/2020 0645   HGB 11.9 (L) 07/23/2020 0645   HGB 13.5 06/18/2020 1056   HCT 35.1 (L) 07/23/2020 0645   HCT 38.6 06/18/2020 1056   PLT 136 (L) 07/23/2020 0645   PLT 243 06/18/2020 1056   MCV 92.9 07/23/2020 0645   MCV 90 06/18/2020 1056   MCH 31.5 07/23/2020 0645   MCHC 33.9 07/23/2020 0645   RDW 13.7 07/23/2020 0645   RDW 13.0 06/18/2020 1056   LYMPHSABS 1.7 06/18/2020 1056   EOSABS 0.4 06/18/2020 1056   BASOSABS 0.0 06/18/2020 1056   I will  No results found for: POCLITH, LITHIUM   No results found for: PHENYTOIN, PHENOBARB, VALPROATE, Anthony  Office Visit from 06/04/2020 in [facility]  AIMS Total Score 22        .res Assessment: Plan:    Rasul was seen today for follow-up, depression and anxiety.  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  Generalized anxiety disorder -     sertraline (ZOLOFT) 50 MG tablet; Take 1.5 tablets (75 mg total) by mouth daily.  Major depressive disorder, recurrent episode, moderate (HCC) -     sertraline (ZOLOFT) 50 MG tablet; Take 1.5 tablets (75 mg total) by mouth daily.  Tardive dyskinesia -     Deutetrabenazine ([name]) 9 MG TABS; Take 1 tablet by mouth 3 (three) times daily.  Fatigue, unspecified type  Insomnia due to mental condition  Severe recurrent major depression with psychotic features, mood-congruent (HCC)    Greater  than 50% of 30 min non-face to face time with patient was spent on counseling and coordination of care. We discussed the following.   We discussed Patient has history of severe recurrent major depression with psychotic features.  There are also elements of OCD.   His paranoid is improved.  General anxiety with some intrusive thoughts and Depression better with sertraline 75.    Taking too much benadryl for sleep.  Disc risk high dose and recommend reduction.  His son has schizophrenia so there may be a genetic predisposition to psychosis.  I am suspecting that he has paranoid schizophrenia that has been previously diagnosed as depression with psychosis but in either case his sx remitted.        He has benefited with reduction in anxiety from the use of sertraline .  Again his depression is better with the sertraline.  Less shame but that may be mild paranoia or anxiety and not depression. Better with  sertraline to 75 mg bc of "nerves on edge".    Disc SE in detail and SSRI withdrawal sx.  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  Discussed his tendency to change his medicines on his own.  There is significant risk in doing so.  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  There is a risk of recurrence if he goes too low or if he goes off of it which is his intention.  He was educated that TD tends to risk turn and recur if you stop the [name].   Continue [name] 9 mg tablets 1 TID TD is manageable but not gone  FU 3 mos   Lynder Parents, MD, DFAPA     Future Appointments  Date Time Provider [place]  03/25/2022  2:05 PM [id number]     No orders of the defined types were placed in this encounter.      -------------------------------

## 2022-02-25 ENCOUNTER — Telehealth: Payer: Self-pay | Admitting: Psychiatry

## 2022-02-25 NOTE — Telephone Encounter (Signed)
FYI

## 2022-02-25 NOTE — Telephone Encounter (Signed)
Good news off Benadryl ?

## 2022-02-25 NOTE — Telephone Encounter (Signed)
Wife lvm at 1:27 pm and said that Neil [name] has stopped taking bendryl and tylenol to help him sleep. He is doing well and sleeping great. Just wanted Dr. Remi Deter to know  ?

## 2022-03-04 ENCOUNTER — Telehealth: Payer: Self-pay | Admitting: Psychiatry

## 2022-03-04 NOTE — Telephone Encounter (Signed)
Pt LVM @ 10:52a.  He is experiencing nervousness and anxiousness and wants to know if you have anything to help. ? ?Next appt 6/26 ?

## 2022-03-04 NOTE — Telephone Encounter (Signed)
Please see message from patient. I called and he said he is anxious, nervous, and restless. There are no recent stressors. He said he sleeps all the time. He describes his appetite as fair, though I see in your last note that he had gained weight. He can't say how his depression is. His wife had called a couple of times since December and reported how he was doing. He is no longer taking Benadryl or Tylenol for sleep and the increase in Sertraline was helpful.   ?

## 2022-03-06 NOTE — Telephone Encounter (Signed)
Recommendation was given to the wife.  ?

## 2022-03-06 NOTE — Telephone Encounter (Signed)
If his anxiety persists into next week he can increase sertraline to 100 mg daily for anxiety. ?

## 2022-03-11 ENCOUNTER — Other Ambulatory Visit (HOSPITAL_COMMUNITY): Payer: Medicare HMO

## 2022-03-11 DIAGNOSIS — R3915 Urgency of urination: Secondary | ICD-10-CM | POA: Diagnosis not present

## 2022-03-11 DIAGNOSIS — R351 Nocturia: Secondary | ICD-10-CM | POA: Diagnosis not present

## 2022-03-11 DIAGNOSIS — R3914 Feeling of incomplete bladder emptying: Secondary | ICD-10-CM | POA: Diagnosis not present

## 2022-03-11 DIAGNOSIS — N401 Enlarged prostate with lower urinary tract symptoms: Secondary | ICD-10-CM | POA: Diagnosis not present

## 2022-03-18 ENCOUNTER — Telehealth: Payer: Self-pay | Admitting: Psychiatry

## 2022-03-18 ENCOUNTER — Other Ambulatory Visit: Payer: Self-pay | Admitting: Psychiatry

## 2022-03-18 DIAGNOSIS — F061 Catatonic disorder due to known physiological condition: Secondary | ICD-10-CM

## 2022-03-18 DIAGNOSIS — F411 Generalized anxiety disorder: Secondary | ICD-10-CM

## 2022-03-18 DIAGNOSIS — F331 Major depressive disorder, recurrent, moderate: Secondary | ICD-10-CM

## 2022-03-18 MED ORDER — SERTRALINE HCL 50 MG PO TABS: 75.0000 mg | ORAL_TABLET | Freq: Every day | ORAL | 0 refills | Status: DC

## 2022-03-18 NOTE — Telephone Encounter (Signed)
Vaughan Basta patient's spouse called to inform you, Neil [name] is doing much better on the Sertraline. Vaughan Basta is requesting the Sertraline refill sent to the [facility] on [address]. ?

## 2022-03-25 ENCOUNTER — Ambulatory Visit (HOSPITAL_COMMUNITY): Payer: Medicare HMO | Attending: Cardiovascular Disease

## 2022-03-25 DIAGNOSIS — Z953 Presence of xenogenic heart valve: Secondary | ICD-10-CM | POA: Insufficient documentation

## 2022-03-25 LAB — ECHOCARDIOGRAM COMPLETE
AV Mean grad: 9 mmHg
AV Peak grad: 18.8 mmHg
Ao pk vel: 2.17 m/s
Area-P 1/2: 1.7 cm2
MV M vel: 5.68 m/s
MV Peak grad: 129 mmHg
Radius: 0.6 cm
S' Lateral: 2.4 cm

## 2022-03-30 ENCOUNTER — Telehealth: Payer: Self-pay | Admitting: Internal Medicine

## 2022-03-30 NOTE — Telephone Encounter (Signed)
Patient wanting to know results of echo. Let wife know that she will be notified as soon as Dr. Debara Pickett makes his notation. ?

## 2022-03-30 NOTE — Telephone Encounter (Signed)
Patient's wife is requesting to discuss echo results. ?

## 2022-03-30 NOTE — Telephone Encounter (Signed)
Also routed to [facility] NP, who ordered study.  ?

## 2022-04-01 ENCOUNTER — Telehealth: Payer: Self-pay

## 2022-04-01 NOTE — Telephone Encounter (Signed)
Lendon Colonel, NP  ?03/31/2022  3:05 PM EDT   ?  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 No new recommendations. We will do follow up echos every few years. KL  ? ?Spoke with wife and notified of results ?She thought patient had visit on 5/11 @ 915 -- no appointment scheduled but went ahead and put patient in time slot. She is unsure if he would like to keep this, as echo stable and he is doing well and previously was seen every 1 year.  ? ?Advised to call back if they wish to cancel this 5/11 visit  ?

## 2022-04-01 NOTE — Telephone Encounter (Addendum)
Called patient regarding results. Spoke with spouse Vaughan Basta. 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 No new recommendations. We will do follow up echos every few years. KL ?

## 2022-04-22 ENCOUNTER — Telehealth: Payer: Self-pay | Admitting: Psychiatry

## 2022-04-22 NOTE — Telephone Encounter (Signed)
Pt's wife Vaughan Basta LVM @ 4:20p.  I had to call back because phone broke up and I couldn't hear the full message.  She said she wanted to advise Dr Clovis Pu that Carroll is taking 2 pills a day of Setraline and 4 pills a day of the [name].  She said she thought Dr Clovis Pu advised he was ok to increase to those dosages, but she wanted confirm that was ok. ? ?Next appt 6/26 ? ? ?

## 2022-04-23 ENCOUNTER — Ambulatory Visit: Payer: Medicare HMO | Admitting: Internal Medicine

## 2022-04-23 NOTE — Telephone Encounter (Signed)
LVM to RC to clarify dosing.  ?

## 2022-04-23 NOTE — Telephone Encounter (Signed)
Spoke with wife and she said patient is taking two 50 mg sertraline and is taking 4 tablets of 9 mg [name]. Please advise.  ?

## 2022-04-24 NOTE — Telephone Encounter (Signed)
That is fine to take [name] 9 mg tablets 2 twice daily and increase sertraline to 100 mg daily.  The insurance company will probably require he take one of the 100 mg tablets rather than 2 of the 50 mg tablets on future refills.  He's an elderly man and this will need to be explained clearly so he doesn't double up on 100 mg sertraline. ?

## 2022-04-28 NOTE — Telephone Encounter (Signed)
Message didn't get routed to me initially, but I found it and discussed with patient's wife.  ?

## 2022-04-28 NOTE — Telephone Encounter (Signed)
I'm not sure you got the previous note.  Please call him or hiw wife. ?

## 2022-04-29 ENCOUNTER — Telehealth: Payer: Self-pay | Admitting: Psychiatry

## 2022-04-29 NOTE — Telephone Encounter (Signed)
Pt's wife LVM @ 1:15p.  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  [name]' brother is going to start taking anti-depressant and they want to make sure he doesn't take that medicine.   ? ?Next appt 6/26 ?

## 2022-04-29 NOTE — Telephone Encounter (Signed)
Patients wife notified

## 2022-05-05 ENCOUNTER — Other Ambulatory Visit: Payer: Self-pay

## 2022-05-05 DIAGNOSIS — G2401 Drug induced subacute dyskinesia: Secondary | ICD-10-CM

## 2022-05-05 MED ORDER — AUSTEDO 9 MG PO TABS: 1.0000 | ORAL_TABLET | Freq: Four times a day (QID) | ORAL | 5 refills | Status: DC

## 2022-05-09 DIAGNOSIS — H6122 Impacted cerumen, left ear: Secondary | ICD-10-CM | POA: Diagnosis not present

## 2022-05-09 DIAGNOSIS — H6981 Other specified disorders of Eustachian tube, right ear: Secondary | ICD-10-CM | POA: Diagnosis not present

## 2022-05-14 DIAGNOSIS — R5383 Other fatigue: Secondary | ICD-10-CM | POA: Diagnosis not present

## 2022-05-14 DIAGNOSIS — I700 Atherosclerosis of aorta: Secondary | ICD-10-CM | POA: Diagnosis not present

## 2022-05-14 DIAGNOSIS — H9193 Unspecified hearing loss, bilateral: Secondary | ICD-10-CM | POA: Diagnosis not present

## 2022-05-14 DIAGNOSIS — I2510 Atherosclerotic heart disease of native coronary artery without angina pectoris: Secondary | ICD-10-CM | POA: Diagnosis not present

## 2022-05-14 DIAGNOSIS — R7303 Prediabetes: Secondary | ICD-10-CM | POA: Diagnosis not present

## 2022-05-14 DIAGNOSIS — E782 Mixed hyperlipidemia: Secondary | ICD-10-CM | POA: Diagnosis not present

## 2022-05-14 DIAGNOSIS — Z1331 Encounter for screening for depression: Secondary | ICD-10-CM | POA: Diagnosis not present

## 2022-05-14 DIAGNOSIS — N400 Enlarged prostate without lower urinary tract symptoms: Secondary | ICD-10-CM | POA: Diagnosis not present

## 2022-05-14 DIAGNOSIS — Z0000 Encounter for general adult medical examination without abnormal findings: Secondary | ICD-10-CM | POA: Diagnosis not present

## 2022-05-14 DIAGNOSIS — R69 Illness, unspecified: Secondary | ICD-10-CM | POA: Diagnosis not present

## 2022-05-14 DIAGNOSIS — G2401 Drug induced subacute dyskinesia: Secondary | ICD-10-CM | POA: Diagnosis not present

## 2022-05-14 DIAGNOSIS — Z951 Presence of aortocoronary bypass graft: Secondary | ICD-10-CM | POA: Diagnosis not present

## 2022-05-14 DIAGNOSIS — F429 Obsessive-compulsive disorder, unspecified: Secondary | ICD-10-CM | POA: Diagnosis not present

## 2022-06-08 ENCOUNTER — Ambulatory Visit: Payer: Medicare HMO | Admitting: Psychiatry

## 2022-06-08 ENCOUNTER — Encounter: Payer: Self-pay | Admitting: Psychiatry

## 2022-06-08 DIAGNOSIS — G2401 Drug induced subacute dyskinesia: Secondary | ICD-10-CM | POA: Diagnosis not present

## 2022-06-08 DIAGNOSIS — F5105 Insomnia due to other mental disorder: Secondary | ICD-10-CM | POA: Diagnosis not present

## 2022-06-08 DIAGNOSIS — R5383 Other fatigue: Secondary | ICD-10-CM | POA: Diagnosis not present

## 2022-06-08 DIAGNOSIS — F061 Catatonic disorder due to known physiological condition: Secondary | ICD-10-CM

## 2022-06-08 DIAGNOSIS — R69 Illness, unspecified: Secondary | ICD-10-CM | POA: Diagnosis not present

## 2022-06-08 DIAGNOSIS — F331 Major depressive disorder, recurrent, moderate: Secondary | ICD-10-CM

## 2022-06-08 DIAGNOSIS — F251 Schizoaffective disorder, depressive type: Secondary | ICD-10-CM | POA: Diagnosis not present

## 2022-06-08 DIAGNOSIS — F411 Generalized anxiety disorder: Secondary | ICD-10-CM

## 2022-06-08 MED ORDER — SERTRALINE HCL 100 MG PO TABS: 100.0000 mg | ORAL_TABLET | Freq: Every day | ORAL | 1 refills | Status: DC

## 2022-06-08 NOTE — Progress Notes (Signed)
ZEE VILL [id number] 28-Jul-1941 81 y.o.    Subjective:   Patient ID:  Neil [name] is a 81 y.o. (DOB 1941/01/12) male.  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  Anxiety Symptoms include nervous/anxious behavior. Patient reports no confusion, decreased concentration, dizziness or palpitations.    Depression        Associated symptoms include no decreased concentration, no fatigue and no appetite change.  Past medical history includes anxiety.     Neil [name] presents today for follow-up of psychosis, depression, and anxiety.  He has required an urgent appointment because of wanting further med changes and frequent phone calls with med changes since he was last here  When seen September 3 he was switched off olanzapine because of EPS and switched on to Saphris which was raised to 5 mg nightly but only took that dose 1 time.  The rest of the time he took only 2.5 mg nightly.   He called back 5 days later stating he wanted to stop it because it was causing him to hold his mouth open and he was having a worsening time with tongue movements and felt "funny".  He was encouraged to give it more time but he would not agree to do so.  He stopped it Friday 9/11 and still felt the funny tense feeling.    When  seen September 01, 2019.  The decision was made to switch from [place] to [place] because of difficulty getting an adequate response and limited side effects from [place].  We also discussed the antipsychotic and the plan was to start Caplyta but those samples were not available so he decided to restart Saphris 2.5 mg nightly.  At the visit September 15, 2019 [name] was increased to 12 mg twice daily, Saphris was discontinued and  Caplyta 42 mg daily was started because of his low EPS risk.  He still needed an antipsychotic.  He continued on sertraline 75 mg daily because he felt it helped his depression.  Wife called 10/8 and said the following: On Caplyta,  Thinks it is too strong.  His movements are slow, sleeping too much, drowsy during day ( doesn't really wake up until 4pm after sleeping all night). Takes him along time to think and respond. Had trouble with mouth and jaw before, and now he is holding his mouth open all the time.  Paranoia is better though. Was told to take it 3 days weekly.    October 16 [name] increased to 18 mg BID  Called again November 9 stating he couldn't tolerate either med.  He stopped the Caplyta and reduced the [name] to 9 mg BID a few days ago.  CO rigidity in lower back and arms.  Still bothered by constant tongue movements.  No change in worry, mood but paranoia is reduced since Caplyta.   visit October 25, 2019.  He had stopped Caplyta.  He had also reduced [name] to 9 mg twice daily on his own.  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  He is very impatient with med changes.  Therefore we did not start a new antipsychotic at the last visit.  He tends to make frequent phone calls and did call again on November 23 complaining of feeling very restless and difficulty sleeping.  Was prescribed clonazepam 0.5 mg twice daily.  He called back the next day stating he wanted to stop the [name] due to restlessness but he had never taken the prescribed clonazepam.  Using courage to follow the treatment plan and take the clonazepam.  He called again the next day stating he still wanted to stop [name] and the clonazepam was making his gait unsteady.  He was informed that if he stopped the [name] his mouth  movements would get worse which is been a leading complaint but he could stop it if he wanted to.  December 4 he reports clonazepam has helped the anxiety and less guilt and less rigidity.  Taking TID and only SE is a little slower movements.  1 fall, slipped on Ba floor where water was lying.  No injury.   Gained 3# and better appetite for the first time in a long time.  CO tongue movements.  Restlessness is now bearable.  Less anxiety overall.  No AH.    visit December 4.  His psychosis was not severe and the decision was made not to start another antipsychotic but instead to try to optimize treatment for tardive dyskinesia and [name] was increased to 12 mg twice daily. He called back immediately after the head visit admitting he had given false information during the visit and that he not actually been taking the [name] in the way he told me during the visit making it necessary to change the [name] to 9 mg twice daily.  He was confronted about his noncompliance and warned that he may be discharged from the practice if he continued to be misleading and noncompliant with the physician.  This is not only compromising care but it is increasing his own risk because he is giving false information.   visit December 21, 2019.  Because of his complaints about balance issues clonazepam was reduced and spread out to 0.25 mg 4 times daily.  Also for tardive dyskinesia symptoms [name] was increased to 12 mg twice daily.  Prior to that visit he admitted being generally noncompliant with the [name] making it difficult to judge what dose to prescribe.  He was continued on sertraline 75 mg daily as he felt that was helpful for his anxiety. Says he's compliant with med changes last visit.  Tongue movements are better but not gone. No problems with the [name].  Stiffness comes and goes and located in head and eyes.  Denies it's vertigo.  Intensity is less.  If shuts his eyes it goes right away.  visit January 04, 2020.  [name] remained 12 mg twice daily and sertraline 75 mg daily.  The following was changed: switch to lorazepam bc tiredness and balance problems to Ativan. Yes DC clonazepam and start lorazepam 0.5 mg 4 times daily  seen February 8 the patient reports the following.  Yes it did help.  Not as slowed and balance is good now.  Still feels tired with tendency to want to lay down and knows he needs to work on this.  Anxiety is pretty fair.  We decided to reduce the lorazepam to 0.5 mg 3 times daily in hopes of improving energy and balance further.  No other meds were changed.  He is still not on an antipsychotic.  Anxiety comes  and goes but sometimes able to get by with TID lorazepam and energy is better.  Wife says sometimes gets uptight and can't relax so needs QID.Pt reports that mood is anxiety aand not much depression and describes anxiety as worry. Anxiety symptoms include: worry not panic and moderate.   Sleep is better usually with hydroxyzine and sometimes lorazepam. Pt reports that appetite is Much better with weight up to 131# as low as 120#.  Normal was 140#.Marland Kitchen Pt reports that energy is better and OK. Concentration is fair.. Suicidal thoughts:  None. Denies sig paranoid thought.  Plan: No med change except okay to use hydroxyzine 25 mg nightly for insomnia.  Per usual the patient tends to make multiple phone calls between appointments.  He is called about 7 times since March.  06/04/2020 appointment with the following noted: Seen with his wife. Reports hydroxyzine 25 mg is not helpful for sleep. Still moving tongue. No SE meds currently. Not depressed.  Is anxious persistent in the back ground and not even connected to thoughts. Little intrusive thougts.   Sleep concerns, to bed 830 an not sure when goes to sleep.  Sometimes can't go to sleep. Get OOB about 9 AM.  Usually goes  back to bed.  CO weakness for 18 mos. Needs Aortic valve replacement. He tried reducing lorazepam to 0.5 mg TID  and had a lot of restlessness but some improvement in energy.  Wonders if fatigue related to meds or heart valve problem.  Probably will have heart surgery. Plan: Try to reduce lorazepam to 3 and 1/2 tablets daily.  to see if energy is better.  Keep the lowest effective dose possible.   Do not exceed 4 Ativan a day. No new antipsychotics started today. Increase [name] for 1 week to 1 of 6 mg and 1 of the 9 mg tablets twice daily for 1 week, Then increase again to 12 mg tablets 1 and 1/2 tablets twice daily or 18 mg twice daily. Continue sertraline 75 mg daily for anxiety.  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  Recovering slowly.  Just started walking a week or 2 ago. [place] Picking handles the meds. [name] doesn't control tongue movements but it helps. Done fine without lorazepam and less drowsy per wife. Taking hydroxyzine 25 mg for sleep with Tylenol.  Sleeping well per wife.   Wife thinks overall he's doing well from mental health perspective and seems calmer.  Pt says the psychosis has lightened up and he's grateful for that.  No depression.   Wants to try to stop hydroxyzine. Wants to try to reduce sertraline to 50 mg to prevent cutting.   Plan: He wants to reduce sertraline to 50 daily.  He says he's less depressed and it has helped his anxiety. Disc the risk of increased depression and anxiety but since 3 mos out from surgery OK. DC hydroxyzine if possible.  11/05/2020 patient called for refill of hydroxyzine and was taking 12.5 mg.  Dosage reduced to 10 mg.  02/14/2021 phone call from his wife Bonita Quin concerned about his tongue movements and his anxiety. MD response: The patient and his wife are elderly.  He seems to forget the purpose of the medication and have some difficulty comprehending the conditions for which he is treated as well as the purpose of the meds and side effect issues. He is also somewhat impatient and anxious wanting changes to be made  quickly. The answer to the question is the Neil [name] is  to treat the symptoms of his tongue movements.  If he stops the [name] the tongue movements will be worse.  The [name] is not causing the tongue movements.  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 He has an appointment with me in 3 weeks.  I do not want to make any med changes over the phone or even med adjustments.  I would urge him to come into the office at that visit.  I do not do not want a televisit.  I want to be able to see him so that I can observe his tongue movements.  03/26/2021 appointment with following noted: Taking [name] 18 mg BID. Claims compliance. Started to feel more fatigued and went to See Dr. Tally Joe 02/25/21 with normal labs including: Comprehensive metabolic panel normal except sodium 132, normal CBC, TSH 2.24, B12 538 normal Fatigue is some better 20%.  Wants to get back in bed after breakfast.  For unclear reasons hydroxyzine raised again to 25 mg nightly. Some initial insomnia and some awakening.  Nap 1 1/2 hour in AM and another hour in afternoon. Goes to bed 730PM and up 8 AM. Tongue movements persisted until 2 weeks ago and has gotten better. Reduced sertraline to 50 mg and anxiety is no worse. Not depressed. Reads about sE [name] and wonders if it is causing fatigue.  Plan: To address fatigue and cognition: Reduce hydroxyzine again to 10 mg and try to stop it. Stop hydroxyzine 25 mg at night Start hydroxyzine 10 mg tablets 1 at night Start Dayvigo 5 mg tablet 1 at night using samples.  If it helps then stop the hydroxyzine to help your fatigue and memory Spending way too much time in bed.  03/31/2021 phone call: Oliva Bustard is not covered until Belsomra is tried.  Patient was to pick up samples of Belsomra. 04/02/2021 phone call: Complaining of too sleepy on trazodone 50 mg nightly.  Suggested they try half of tablet and if is still a problem stop it. 04/29/2021 phone call with questions and  concerns about the [name].  Wife saying that he had problems with rigidity in his arms and hands. MD response: The patient and his wife call fairly often with complaints that very between excessive tongue movements associated with tardive dyskinesia and on the alternative side the sort of complaints that could be related to parkinsonian side effects of [name].  So if he takes too little [name] his tongue movements will get worse but if he takes too much he may shuffle more than usual.  Even at his baseline before he took [name] he tended to shuffle his feet and have a reduced arm swing.  He is currently on 12 mg [name] 2 tablets twice daily.  If we reduce it we should reduce it only slightly to 12+9 mg tablets 1 of each twice daily.  My advice is that we not make the any med change until his next appointment.  At his last appointment he stated the tongue movements had only recently gotten better and if we reduce the dose they may get worse again.  06/25/2021 appointment with the following noted: He reduced [name] on his own to 1 and 1/4 of 9 mg tablets about a week ago bc sleepiness, rigidity of arms and legs and fatigue partly better.  Tongue movements have come back a little. No sleep meds needed.  Tired and lays in bed a lot. Minimal anxiety and depression. Has stopped hydroxyzine. Started OTC TremorSmoothe prn. Wants to come  off [name] bc sx noted.   Always tended to sleep a lot. Occ recurring disturbing dream. Plan: To address fatigue, sleepiness and cognition: After another week of [name] 9 mg tablets 1 and 1/4 tablets twice daily he can reduce it to 1 tablet twice daily but risk return of TD coming off of this as he wants to do. TD is managed.  09/25/2021 appointment with the following noted: He reduced [name] from 18 mg BID to 13.5 mg BID and feels it's better with less general tension in his body.  Lately energy better but not right after reducing the med. No worsening of tongue  movements which come and go. Depression wife says the weakness he feels is psychological bc likes to sleep a lot.  Not sure how long it's been going on.  To bed 7:30 and gets up about 830.  Walks and then back to bed until about 1130.  Sleeps some.  Chronic tiredness.   Still takes Zoloft 50.   Nervousness is there in his body but not worrying about anything.   pLan no med changes  10/06/2021 phone call asking to increase [name] to 12 mg twice daily.  That dosage was changed as requested.  10/15/2021 phone call from patient's wife Bonita Quin stating bupropion ER 100 mg every morning made him too hyper and nervous and he stopped it.  10/21/2021 phone call from wife stating with the change in [name] dose the wife thinks his TD symptoms are worse.  On 12 mg twice daily versus 9 mg tablets 1-1/2 twice daily.  So the 9 mg dose was sent back in.  12/03/2021 appointment with the following noted: Taking [name] 9 mg TID, sertraline 50, no Wellbutrin. Fair.  Tiredness not as pronounced as it was.  Not sure why. Not sure about depression, I have something.  Can't describe it but not worrying a lot.  Has had a number of stressors.  Nerves on edge at times.  Trouble resting but still sleeps a lot.  It comes and goes.  Sleeps 10 hours. Not that productive and doesn't do much.  Enjoyed son and his family for Christmas. TD better not gone.  Worse under stress.   Not going to church DT fear of Covid and history of heart operation. Plan: Increase  sertraline to 75 mg bc of "nerves on edge".    02/09/22 appt noted: Increase apptetite and  wt from 145 to 159#.  In the last couple of months. Less anxiety with nerves less on edge. [name] workig fair but tongue still moves. Depression better not gone. Energy better but fair. Sleep with benadryl 4 nightly. Plan: continue sertraline 75 and [name] 9 mg TID-QID  TC 03/06/22  4:09 PM Note If his anxiety persists into next week he can increase sertraline to 100 mg  daily for anxiety.      06/08/22 appt noted: Continues sertraline 100 mg working better, [name] 9 mg 4 daily Tolerating meds.  Less SE  now than in the past but slow gait with short steps at times. Thinks tongue movements better with more [name]. Anxiety better controlled.  Depression not too bad. Went to church first time Sunday and that helped.  Had avoided bc Covid fears. Plans to attend person on Sunday mornings. Otherwise health is OK. Son helpful but fixated on rapture.  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  Son Loraine Leriche living with them.  Become more committed [place].  He has schizophrenia.   Past Psychiatric Medication Trials: Mirtazapine,  olanzapine 5 EPS, perphenazine started TD with mouth px,  Saphris 5 mg and stopped due to EPS complaints and feeling "funny" Caplyta side effects risperidone 2 tremors  Buspirone,   amitriptyline 25,  sertraline 50 Wellbutrin SR 100 nervous Hydroxyzine 10-20 Clonazepam 0.5 TID dizzy and sleepy Ativan 0.5 mg QID Ingrezza and  [name] 18 mg BID  Review of Systems:  Review of Systems  Constitutional:  Negative for appetite change and fatigue.  Cardiovascular:  Negative for palpitations.  Gastrointestinal:  Negative for constipation.  Neurological:  Negative for dizziness, tremors, syncope and weakness.       No falls.  Slow gait. But complain of poor balance but not "bad"  Psychiatric/Behavioral:  Negative for agitation, behavioral problems, confusion, decreased concentration, dysphoric mood, hallucinations, self-injury and sleep disturbance. The patient is nervous/anxious. The patient is not hyperactive.        Sleepy  CABG 07/19/20  Medications: I have reviewed the patient's current medications.  Current Outpatient Medications  Medication Sig Dispense Refill   acetaminophen (TYLENOL) 500 MG tablet Take 500 mg by mouth daily.     clopidogrel (PLAVIX) 75 MG tablet Take 1 tablet (75 mg total) by mouth daily. 90 tablet 2    Deutetrabenazine ([name]) 9 MG TABS Take 1 tablet by mouth 4 (four) times daily. 120 tablet 5   Multiple Vitamin (MULTIVITAMIN WITH MINERALS) TABS tablet Take 2 tablets by mouth daily. 50 +     rosuvastatin (CRESTOR) 40 MG tablet Take 1 tablet (40 mg total) by mouth daily. 90 tablet 2   sennosides-docusate sodium (SENOKOT-S) 8.6-50 MG tablet Take 1 tablet by mouth daily with supper.     sertraline (ZOLOFT) 50 MG tablet Take 1.5 tablets (75 mg total) by mouth daily. (Patient taking differently: Take 75 mg by mouth daily. 2 tabs daily) 135 tablet 0   silodosin (RAPAFLO) 4 MG CAPS capsule Take 4 mg by mouth daily with breakfast.     VITAMIN D PO Take 1 capsule by mouth daily.     metoprolol tartrate (LOPRESSOR) 25 MG tablet Take 1 tablet (25 mg total) by mouth 2 (two) times daily. (Patient not taking: Reported on 12/03/2021) 180 tablet 2   No current facility-administered medications for this visit.    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   LIMA to LAD, Sequential SVG to OM1-OM3, SVG to RCA, EVH via left thigh   Schizophrenia (HCC)    Severe aortic stenosis     Family History  Problem Relation Age of Onset   Hypertension Mother    Heart attack Father    Hypertension Sister    Hypertension Brother     Social History   Socioeconomic History    Marital status: Married    Spouse name: Not on file  Number of children: Not on file   Years of education: Not on file   Highest education level: Not on file  Occupational History   Not on file  Tobacco Use   Smoking status: Never   Smokeless tobacco: Never  Vaping Use   Vaping Use: Never used  Substance and Sexual Activity   Alcohol use: No   Drug use: Never   Sexual activity: Not on file  Other Topics Concern   Not on file  Social History Narrative   Not on file   Social Determinants of Health   Financial Resource Strain: Not on file  Food Insecurity: Not on file  Transportation Needs: Not on file  Physical Activity: Not on file  Stress: Not on file  Social Connections: Not on file  Intimate Partner Violence: Not on file    Past Medical History, Surgical history, Social history, and Family history were reviewed and updated as appropriate.   Please see review of systems for further details on the patient's review from today.   Objective:   Physical Exam:  There were no vitals taken for this visit.  Physical Exam Constitutional:      General: He is not in acute distress. Musculoskeletal:        General: No deformity.  Neurological:     Mental Status: He is alert and oriented to person, place, and time. Mental status is at baseline.     Cranial Nerves: No dysarthria.     Coordination: Coordination normal.     Comments: Mild tongue and buccal movements continuous. Mainly slow opening and closing of mouth and some tongue rolling.  milder with less distress from it than in the past  Psychiatric:        Attention and Perception: Attention and perception normal. He does not perceive auditory or visual hallucinations.        Mood and Affect: Mood is anxious. Mood is not depressed. Affect is not labile, blunt, angry or inappropriate.        Speech: Speech normal.        Behavior: Behavior is slowed. Behavior is cooperative.        Thought Content: Thought content  normal. Thought content is not paranoid or delusional. Thought content does not include homicidal or suicidal ideation. Thought content does not include suicidal plan.        Cognition and Memory: Cognition normal. He exhibits impaired recent memory.        Judgment: Judgment normal.     Comments: Insight and judgment fair.      Lab Review:     Component Value Date/Time   NA 132 (L) 07/24/2020 0400   NA 130 (L) 07/09/2020 1508   K 3.4 (L) 07/24/2020 0400   CL 96 (L) 07/24/2020 0400   CO2 24 07/24/2020 0400   GLUCOSE 115 (H) 07/24/2020 0400   BUN 20 07/24/2020 0400   BUN 16 07/09/2020 1508   CREATININE 0.74 07/24/2020 0400   CALCIUM 7.6 (L) 07/24/2020 0400   PROT 6.6 07/17/2020 0920   PROT 6.6 07/09/2020 1508   ALBUMIN 3.8 07/17/2020 0920   ALBUMIN 4.4 07/09/2020 1508   AST 18 07/17/2020 0920   ALT 22 07/17/2020 0920   ALKPHOS 54 07/17/2020 0920   BILITOT 0.7 07/17/2020 0920   BILITOT 0.4 07/09/2020 1508   GFRNONAA >60 07/24/2020 0400   GFRAA >60 07/24/2020 0400       Component Value Date/Time   WBC 14.0 (H) 07/23/2020 0645  RBC 3.78 (L) 07/23/2020 0645   HGB 11.9 (L) 07/23/2020 0645   HGB 13.5 06/18/2020 1056   HCT 35.1 (L) 07/23/2020 0645   HCT 38.6 06/18/2020 1056   PLT 136 (L) 07/23/2020 0645   PLT 243 06/18/2020 1056   MCV 92.9 07/23/2020 0645   MCV 90 06/18/2020 1056   MCH 31.5 07/23/2020 0645   MCHC 33.9 07/23/2020 0645   RDW 13.7 07/23/2020 0645   RDW 13.0 06/18/2020 1056   LYMPHSABS 1.7 06/18/2020 1056   EOSABS 0.4 06/18/2020 1056   BASOSABS 0.0 06/18/2020 1056   I will  No results found for: "POCLITH", "LITHIUM"   No results found for: "PHENYTOIN", "PHENOBARB", "VALPROATE", "CBMZ"   AIMS    Flowsheet Row Office Visit from 06/04/2020 in [facility]  AIMS Total Score 22        .res Assessment: Plan:    Jacere was seen today for follow-up, depression, anxiety and medication reaction.  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 We discussed the following.   We discussed Patient has history of severe recurrent major depression with psychotic features.  There are also elements of OCD.   His paranoid is improved.  General anxiety with some intrusive thoughts and Depression better with sertraline 100   Taking too much benadryl for sleep.  Disc risk high dose and recommend reduction.  His son has schizophrenia so there may be a genetic predisposition to psychosis.  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  He has benefited with reduction in anxiety from the use of sertraline .  Again his depression is better with the sertraline.  Less shame but that may be mild paranoia or anxiety and not depression. Better with  sertraline to 100 mg bc of "nerves on edge".    Disc SE in detail and SSRI withdrawal sx.   Discussed his tendency to change his medicines on his own.  There is significant risk in doing so.  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  There is a risk of recurrence if he goes too low or if he goes off of it which is his intention.  He was educated that TD tends to risk turn and recur if you stop the [name].   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  FU 3 mos   Meredith Staggers, MD, DFAPA     Future Appointments  Date Time Provider Department Center   10/26/2022  2:00 PM Hilty, Lisette Abu, MD [facility]     No orders of the defined types were placed in this encounter.      -------------------------------

## 2022-07-16 ENCOUNTER — Telehealth: Payer: Self-pay | Admitting: Psychiatry

## 2022-07-16 NOTE — Telephone Encounter (Signed)
Wife lvm that dal is taking three a day of the austedo per dr. Clovis Pu request. So they have just received another bottle of austedo in the mail. It is to much medicine. Thinks the script needs to be changed. Please give her a call at [phone]

## 2022-07-17 NOTE — Telephone Encounter (Signed)
Wife said they have an abundance of [name]. Patient is taking 3 tablets now and not 4 as prescribed. Asked pharmacy to not send automatic refills and to not fill again until requested by patient.

## 2022-07-21 DIAGNOSIS — H52223 Regular astigmatism, bilateral: Secondary | ICD-10-CM | POA: Diagnosis not present

## 2022-07-21 DIAGNOSIS — H5203 Hypermetropia, bilateral: Secondary | ICD-10-CM | POA: Diagnosis not present

## 2022-07-21 DIAGNOSIS — H524 Presbyopia: Secondary | ICD-10-CM | POA: Diagnosis not present

## 2022-07-21 DIAGNOSIS — H2513 Age-related nuclear cataract, bilateral: Secondary | ICD-10-CM | POA: Diagnosis not present

## 2022-07-21 DIAGNOSIS — Z135 Encounter for screening for eye and ear disorders: Secondary | ICD-10-CM | POA: Diagnosis not present

## 2022-08-15 ENCOUNTER — Other Ambulatory Visit: Payer: Self-pay | Admitting: Psychiatry

## 2022-08-15 ENCOUNTER — Other Ambulatory Visit: Payer: Self-pay | Admitting: Adult Health

## 2022-08-15 DIAGNOSIS — F061 Catatonic disorder due to known physiological condition: Secondary | ICD-10-CM

## 2022-08-15 DIAGNOSIS — F331 Major depressive disorder, recurrent, moderate: Secondary | ICD-10-CM

## 2022-08-15 DIAGNOSIS — F411 Generalized anxiety disorder: Secondary | ICD-10-CM

## 2022-08-18 ENCOUNTER — Telehealth: Payer: Self-pay | Admitting: Psychiatry

## 2022-08-18 NOTE — Telephone Encounter (Signed)
Called wife again and she said she was told there was no RF. ? If trying to refill an older Rx. Called pharmacy and they said Rx was filled in July and was not picked up.  They will refill it. Notified wife and she said they would pick it up tomorrow.

## 2022-08-18 NOTE — Telephone Encounter (Signed)
LVM to RC. Patient has a RF available at pharmacy.

## 2022-08-18 NOTE — Telephone Encounter (Signed)
Next visit is 10/13/22. Neil [name] wife called requesting a refill on his Sertraline. She states that he has none left. Pharmacy is:  [id number]- [place], [place] [address] Phone:  [phone] Fax:  [phone]

## 2022-09-11 ENCOUNTER — Telehealth: Payer: Self-pay | Admitting: Internal Medicine

## 2022-09-11 NOTE — Telephone Encounter (Signed)
Pt c/o medication issue:  1. Name of Medication: rosuvastatin (CRESTOR) 40 MG tablet  2. How are you currently taking this medication (dosage and times per day)? As prescribed   3. Are you having a reaction (difficulty breathing--STAT)?   4. What is your medication issue?  Pt's wife would like a call back to discuss of this medication could be the cause of pt's weakness and fatigue. Requesting call back.

## 2022-09-11 NOTE — Telephone Encounter (Signed)
Pt's wife called stating pt has been experiencing weakness and fatigue for the past year (no vitals to report) and questioning if it could be related to rosuvastatin. Wife report she recently seen a commercial that indicated statins can cause those symptoms.  Chart noted pt is not taking metoprolol, however wife report pt is currently taking.   Will forward to MD for recommendations.

## 2022-09-13 NOTE — Telephone Encounter (Signed)
Hard to know - would advise a 2 week statin holiday to see if symptoms improve - he should advise US of the outcome either way and restart the cholesterol medicine if he does not feel any different.  Dr. Lemmie Evens

## 2022-09-14 NOTE — Telephone Encounter (Signed)
Wife updated and verbalized understanding.  

## 2022-09-26 ENCOUNTER — Other Ambulatory Visit: Payer: Self-pay | Admitting: Psychiatry

## 2022-09-26 ENCOUNTER — Other Ambulatory Visit: Payer: Self-pay | Admitting: Internal Medicine

## 2022-09-26 DIAGNOSIS — F333 Major depressive disorder, recurrent, severe with psychotic symptoms: Secondary | ICD-10-CM

## 2022-09-26 DIAGNOSIS — F411 Generalized anxiety disorder: Secondary | ICD-10-CM

## 2022-09-28 ENCOUNTER — Telehealth: Payer: Self-pay | Admitting: Internal Medicine

## 2022-09-28 NOTE — Telephone Encounter (Signed)
Pt c/o medication issue:  1. Name of Medication: Clopidogrel  2. How are you currently taking this medication (dosage and times per day)? 1 time a day  3. Are you having a reaction (difficulty breathing--STAT)?   4. What is your medication issue? Patient feels much better, not taking the Plavix

## 2022-09-28 NOTE — Telephone Encounter (Signed)
Spoke with wife. She said she misspoke; it was the rosuvastatin the patient stopped taking for 2 weeks, not Plavix. She said patient is less tired and more alert being off rosuvastatin. Please advise.

## 2022-09-30 ENCOUNTER — Other Ambulatory Visit: Payer: Self-pay | Admitting: Internal Medicine

## 2022-10-01 NOTE — Telephone Encounter (Signed)
Spouse advised that patient can be off statin until seen at his November appointment. Spouse thanked me for the call.

## 2022-10-13 ENCOUNTER — Ambulatory Visit: Payer: Medicare HMO | Admitting: Psychiatry

## 2022-10-13 ENCOUNTER — Other Ambulatory Visit: Payer: Self-pay | Admitting: Psychiatry

## 2022-10-13 ENCOUNTER — Encounter: Payer: Self-pay | Admitting: Psychiatry

## 2022-10-13 DIAGNOSIS — F061 Catatonic disorder due to known physiological condition: Secondary | ICD-10-CM | POA: Diagnosis not present

## 2022-10-13 DIAGNOSIS — G2401 Drug induced subacute dyskinesia: Secondary | ICD-10-CM | POA: Diagnosis not present

## 2022-10-13 DIAGNOSIS — R5383 Other fatigue: Secondary | ICD-10-CM

## 2022-10-13 DIAGNOSIS — F251 Schizoaffective disorder, depressive type: Secondary | ICD-10-CM | POA: Diagnosis not present

## 2022-10-13 DIAGNOSIS — F411 Generalized anxiety disorder: Secondary | ICD-10-CM | POA: Diagnosis not present

## 2022-10-13 DIAGNOSIS — F5105 Insomnia due to other mental disorder: Secondary | ICD-10-CM | POA: Diagnosis not present

## 2022-10-13 DIAGNOSIS — F331 Major depressive disorder, recurrent, moderate: Secondary | ICD-10-CM | POA: Diagnosis not present

## 2022-10-13 DIAGNOSIS — R69 Illness, unspecified: Secondary | ICD-10-CM | POA: Diagnosis not present

## 2022-10-13 MED ORDER — SERTRALINE HCL 100 MG PO TABS: 100.0000 mg | ORAL_TABLET | Freq: Every day | ORAL | 1 refills | Status: DC

## 2022-10-13 NOTE — Progress Notes (Signed)
Neil [name] [id number] November 09, 1941 81 y.o.    Subjective:   Patient ID:  Neil [name] is a 81 y.o. (DOB 1941-08-16) male.  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  Anxiety Symptoms include nervous/anxious behavior. Patient reports no confusion, decreased concentration, dizziness or palpitations.    Depression        Associated symptoms include no decreased concentration, no fatigue and no appetite change.  Past medical history includes anxiety.     Neil [name] presents today for follow-up of psychosis, depression, and anxiety.  He has required an urgent appointment because of wanting further med changes and frequent phone calls with med changes since he was last here  When seen September 3 he was switched off olanzapine because of EPS and switched on to Saphris which was raised to 5 mg nightly but only took that dose 1 time.  The rest of the time he took only 2.5 mg nightly.   He called back 5 days later stating he wanted to stop it because it was causing him to hold his mouth open and he was having a worsening time with tongue movements and felt "funny".  He was encouraged to give it more time but he would not agree to do so.  He stopped it Friday 9/11 and still felt the funny tense feeling.    When  seen September 01, 2019.  The decision was made to switch from [place] to [place] because of difficulty getting an adequate response and limited side effects from [place].  We also discussed the antipsychotic and the plan was to start [place] but those samples were not available so he decided to restart Saphris 2.5 mg nightly.  At the visit September 15, 2019 [name] was increased to 12 mg twice daily, Saphris was discontinued and Caplyta 42 mg  daily was started because of his low EPS risk.  He still needed an antipsychotic.  He continued on sertraline 75 mg daily because he felt it helped his depression.  Wife called 10/8 and said the following: On Caplyta,  Thinks it is too strong.  His movements are slow, sleeping too much, drowsy during day ( doesn't really wake up until 4pm after sleeping all night). Takes him along time to think and respond. Had trouble with mouth and jaw before, and now he is holding his mouth open all the time.  Paranoia is better though. Was told to take it 3 days weekly.    October 16 [name] increased to 18 mg BID  Called again November 9 stating he couldn't tolerate either med.  He stopped the Caplyta and reduced the [name] to 9 mg BID a few days ago.  CO rigidity in lower back and arms.  Still bothered by constant tongue movements.  No change in worry, mood but paranoia is reduced since Caplyta.   visit October 25, 2019.  He had stopped Caplyta.  He had also reduced [name] to 9 mg twice daily on his own.  He has been making frequent med changes [place] which is made it extremely difficult to get an antipsychotic to deal with his psychotic symptoms as well as adjust the [name] today with tardive dyskinesia symptoms.  He is very impatient with med changes.  Therefore  we did not start a new antipsychotic at the last visit.  He tends to make frequent phone calls and did call again on November 23 complaining of feeling very restless and difficulty sleeping.  Was prescribed clonazepam 0.5 mg twice daily.  He called back the next day stating he wanted to stop the [name] due to restlessness but he had never taken the prescribed clonazepam.  Using courage to follow the treatment plan and take the clonazepam.  He called again the next day stating he still wanted to stop [name] and the clonazepam was making his gait unsteady.  He was informed that if he stopped the [name] his mouth movements would get  worse which is been a leading complaint but he could stop it if he wanted to.  December 4 he reports clonazepam has helped the anxiety and less guilt and less rigidity.  Taking TID and only SE is a little slower movements.  1 fall, slipped on Ba floor where water was lying.  No injury.   Gained 3# and better appetite for the first time in a long time.  CO tongue movements.  Restlessness is now bearable.  Less anxiety overall.  No AH.    visit December 4.  His psychosis was not severe and the decision was made not to start another antipsychotic but instead to try to optimize treatment for tardive dyskinesia and [name] was increased to 12 mg twice daily. He called back immediately after the head visit admitting he had given false information during the visit and that he not actually been taking the [name] in the way he told me during the visit making it necessary to change the [name] to 9 mg twice daily.  He was confronted about his noncompliance and warned that he may be discharged from the practice if he continued to be misleading and noncompliant with the physician.  This is not only compromising care but it is increasing his own risk because he is giving false information.   visit December 21, 2019.  Because of his complaints about balance issues clonazepam was reduced and spread out to 0.25 mg 4 times daily.  Also for tardive dyskinesia symptoms [name] was increased to 12 mg twice daily.  Prior to that visit he admitted being generally noncompliant with the [name] making it difficult to judge what dose to prescribe.  He was continued on sertraline 75 mg daily as he felt that was helpful for his anxiety. Says he's compliant with med changes last visit.  Tongue movements are better but not gone. No problems with the [name].  Stiffness comes and goes and located in head and eyes.  Denies it's vertigo.  Intensity is less.  If shuts his eyes it goes right away.  visit January 04, 2020.  [name] remained  12 mg twice daily and sertraline 75 mg daily.  The following was changed: switch to lorazepam bc tiredness and balance problems to Ativan. Yes DC clonazepam and start lorazepam 0.5 mg 4 times daily  seen February 8 the patient reports the following.  Yes it did help.  Not as slowed and balance is good now.  Still feels tired with tendency to want to lay down and knows he needs to work on this.  Anxiety is pretty fair.  We decided to reduce the lorazepam to 0.5 mg 3 times daily in hopes of improving energy and balance further.  No other meds were changed.  He is still not on an antipsychotic.  Anxiety comes and  goes but sometimes able to get by with TID lorazepam and energy is better.  Wife says sometimes gets uptight and can't relax so needs QID.Pt reports that mood is anxiety aand not much depression and describes anxiety as worry. Anxiety symptoms include: worry not panic and moderate.   Sleep is better usually with hydroxyzine and sometimes lorazepam. Pt reports that appetite is Much better with weight up to 131# as low as 120#.  Normal was 140#.Marland Kitchen Pt reports that energy is better and OK. Concentration is fair.. Suicidal thoughts:  None. Denies sig paranoid thought.  Plan: No med change except okay to use hydroxyzine 25 mg nightly for insomnia.  Per usual the patient tends to make multiple phone calls between appointments.  He is called about 7 times since March.  06/04/2020 appointment with the following noted: Seen with his wife. Reports hydroxyzine 25 mg is not helpful for sleep. Still moving tongue. No SE meds currently. Not depressed.  Is anxious persistent in the back ground and not even connected to thoughts. Little intrusive thougts.   Sleep concerns, to bed 830 an not sure when goes to sleep.  Sometimes can't go to sleep. Get OOB about 9 AM.  Usually goes  back to bed.  CO weakness for 18 mos. Needs Aortic valve replacement. He tried reducing lorazepam to 0.5 mg TID and had a lot of  restlessness but some improvement in energy.  Wonders if fatigue related to meds or heart valve problem.  Probably will have heart surgery. Plan: Try to reduce lorazepam to 3 and 1/2 tablets daily.  to see if energy is better.  Keep the lowest effective dose possible.   Do not exceed 4 Ativan a day. No new antipsychotics started today. Increase [name] for 1 week to 1 of 6 mg and 1 of the 9 mg tablets twice daily for 1 week, Then increase again to 12 mg tablets 1 and 1/2 tablets twice daily or 18 mg twice daily. Continue sertraline 75 mg daily for anxiety.  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  Recovering slowly.  Just started walking a week or 2 ago. Tor Netters handles the meds. [name] doesn't control tongue movements but it helps. Done fine without lorazepam and less drowsy per wife. Taking hydroxyzine 25 mg for sleep with Tylenol.  Sleeping well per wife.   Wife thinks overall he's doing well from mental health perspective and seems calmer.  Pt says the psychosis has lightened up and he's grateful for that.  No depression.   Wants to try to stop hydroxyzine. Wants to try to reduce sertraline to 50 mg to prevent cutting.   Plan: He wants to reduce sertraline to 50 daily.  He says he's less depressed and it has helped his anxiety. Disc the risk of increased depression and anxiety but since 3 mos out from surgery OK. DC hydroxyzine if possible.  11/05/2020 patient called for refill of hydroxyzine and was taking 12.5 mg.  Dosage reduced to 10 mg.  02/14/2021 phone call from his wife Vaughan Basta concerned about his tongue movements and his anxiety. MD response: The patient and his wife are elderly.  He seems to forget the purpose of the medication and have some difficulty comprehending the conditions for which he is treated as well as the purpose of the meds and side effect issues. He is also somewhat impatient and anxious wanting changes to be made quickly. The  answer to the question is the Renee Harder is to  treat the symptoms of his tongue movements.  If he stops the [name] the tongue movements will be worse.  The [name] is not causing the tongue movements.  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 He has an appointment with me in 3 weeks.  I do not want to make any med changes over the phone or even med adjustments.  I would urge him to come into the office at that visit.  I do not do not want a televisit.  I want to be able to see him so that I can observe his tongue movements.  03/26/2021 appointment with following noted: Taking [name] 18 mg BID. Claims compliance. Started to feel more fatigued and went to See Dr. Antony Contras 02/25/21 with normal labs including: Comprehensive metabolic panel normal except sodium 132, normal CBC, TSH 2.24, B12 538 normal Fatigue is some better 20%.  Wants to get back in bed after breakfast.  For unclear reasons hydroxyzine raised again to 25 mg nightly. Some initial insomnia and some awakening.  Nap 1 1/2 hour in AM and another hour in afternoon. Goes to bed 730PM and up 8 AM. Tongue movements persisted until 2 weeks ago and has gotten better. Reduced sertraline to 50 mg and anxiety is no worse. Not depressed. Reads about sE [name] and wonders if it is causing fatigue.  Plan: To address fatigue and cognition: Reduce hydroxyzine again to 10 mg and try to stop it. Stop hydroxyzine 25 mg at night Start hydroxyzine 10 mg tablets 1 at night Start Dayvigo 5 mg tablet 1 at night using samples.  If it helps then stop the hydroxyzine to help your fatigue and memory Spending way too much time in bed.  03/31/2021 phone call: Jarold Motto is not covered until Belsomra is tried.  Patient was to pick up samples of Belsomra. 04/02/2021 phone call: Complaining of too sleepy on trazodone 50 mg nightly.  Suggested they try half of tablet and if is still a problem stop it. 04/29/2021 phone call with questions and concerns  about the [name].  Wife saying that he had problems with rigidity in his arms and hands. MD response: The patient and his wife call fairly often with complaints that very between excessive tongue movements associated with tardive dyskinesia and on the alternative side the sort of complaints that could be related to parkinsonian side effects of [name].  So if he takes too little [name] his tongue movements will get worse but if he takes too much he may shuffle more than usual.  Even at his baseline before he took [name] he tended to shuffle his feet and have a reduced arm swing.  He is currently on 12 mg [name] 2 tablets twice daily.  If we reduce it we should reduce it only slightly to 12+9 mg tablets 1 of each twice daily.  My advice is that we not make the any med change until his next appointment.  At his last appointment he stated the tongue movements had only recently gotten better and if we reduce the dose they may get worse again.  06/25/2021 appointment with the following noted: He reduced [name] on his own to 1 and 1/4 of 9 mg tablets about a week ago bc sleepiness, rigidity of arms and legs and fatigue partly better.  Tongue movements have come back a little. No sleep meds needed.  Tired and lays in bed a lot. Minimal anxiety and depression. Has stopped hydroxyzine. Started OTC TremorSmoothe prn. Wants to come off  [name] bc sx noted.   Always tended to sleep a lot. Occ recurring disturbing dream. Plan: To address fatigue, sleepiness and cognition: After another week of [name] 9 mg tablets 1 and 1/4 tablets twice daily he can reduce it to 1 tablet twice daily but risk return of TD coming off of this as he wants to do. TD is managed.  09/25/2021 appointment with the following noted: He reduced [name] from 18 mg BID to 13.5 mg BID and feels it's better with less general tension in his body.  Lately energy better but not right after reducing the med. No worsening of tongue movements  which come and go. Depression wife says the weakness he feels is psychological bc likes to sleep a lot.  Not sure how long it's been going on.  To bed 7:30 and gets up about 830.  Walks and then back to bed until about 1130.  Sleeps some.  Chronic tiredness.   Still takes Zoloft 50.   Nervousness is there in his body but not worrying about anything.   pLan no med changes  10/06/2021 phone call asking to increase [name] to 12 mg twice daily.  That dosage was changed as requested.  10/15/2021 phone call from patient's wife Vaughan Basta stating bupropion ER 100 mg every morning made him too hyper and nervous and he stopped it.  10/21/2021 phone call from wife stating with the change in [name] dose the wife thinks his TD symptoms are worse.  On 12 mg twice daily versus 9 mg tablets 1-1/2 twice daily.  So the 9 mg dose was sent back in.  12/03/2021 appointment with the following noted: Taking [name] 9 mg TID, sertraline 50, no Wellbutrin. Fair.  Tiredness not as pronounced as it was.  Not sure why. Not sure about depression, I have something.  Can't describe it but not worrying a lot.  Has had a number of stressors.  Nerves on edge at times.  Trouble resting but still sleeps a lot.  It comes and goes.  Sleeps 10 hours. Not that productive and doesn't do much.  Enjoyed son and his family for Christmas. TD better not gone.  Worse under stress.   Not going to church DT fear of Covid and history of heart operation. Plan: Increase  sertraline to 75 mg bc of "nerves on edge".    02/09/22 appt noted: Increase apptetite and  wt from 145 to 159#.  In the last couple of months. Less anxiety with nerves less on edge. [name] workig fair but tongue still moves. Depression better not gone. Energy better but fair. Sleep with benadryl 4 nightly. Plan: continue sertraline 75 and [name] 9 mg TID-QID  TC 03/06/22  4:09 PM Note If his anxiety persists into next week he can increase sertraline to 100 mg daily for  anxiety.     06/08/22 appt noted: Continues sertraline 100 mg working better, [name] 9 mg 4 daily Tolerating meds.  Less SE  now than in the past but slow gait with short steps at times. Thinks tongue movements better with more [name]. Anxiety better controlled.  Depression not too bad. Went to church first time Sunday and that helped.  Had avoided bc Covid fears. Plans to attend person on Sunday mornings. Otherwise health is OK. Son helpful but fixated on rapture. Plan: Continue [name] 9 mg tablets 1 QID and sertraline 100   10/13/22 appt noted: Continues sertraline 100 mg working better, [name] 9 mg 4 daily Fair  with walking as  the only real problem.  Sometimes short steps and not much endurance.  Has had PT in the past. Tongue movmements generally controlled.  When cut back to [name] TID he complained of more tongue movements. He also tried stopping it all together also.   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  Son [name] living with them.  Become more committed [place].  He has schizophrenia.   Past Psychiatric Medication Trials: Mirtazapine,  olanzapine 5 EPS, perphenazine started TD with mouth px,  Saphris 5 mg and stopped due to EPS complaints and feeling "funny" Caplyta side effects risperidone 2 tremors  Buspirone,   amitriptyline 25,  sertraline 50 Wellbutrin SR 100 nervous Hydroxyzine 10-20 Clonazepam 0.5 TID dizzy and sleepy Ativan 0.5 mg QID Ingrezza and  [name] 18 mg BID  Review of Systems:  Review of Systems  Constitutional:  Negative for appetite change and fatigue.  Cardiovascular:  Negative for palpitations.  Gastrointestinal:  Negative for constipation.  Neurological:  Negative for dizziness, tremors and syncope.       No falls.  Slow gait.   Psychiatric/Behavioral:  Negative for agitation, behavioral problems, confusion, decreased concentration, dysphoric mood, hallucinations, self-injury and sleep disturbance. The patient is  nervous/anxious. The patient is not hyperactive.        Sleepy  CABG 07/19/20  Medications: I have reviewed the patient's current medications.  Current Outpatient Medications  Medication Sig Dispense Refill   acetaminophen (TYLENOL) 500 MG tablet Take 500 mg by mouth daily.     clopidogrel (PLAVIX) 75 MG tablet TAKE 1 TABLET BY MOUTH EVERY DAY 90 tablet 3   Deutetrabenazine ([name]) 9 MG TABS Take 1 tablet by mouth 4 (four) times daily. 120 tablet 5   metoprolol tartrate (LOPRESSOR) 25 MG tablet Take 1 tablet (25 mg total) by mouth 2 (two) times daily. Schedule an appointment for further refills, 1st attempt 60 tablet 0   Multiple Vitamin (MULTIVITAMIN WITH MINERALS) TABS tablet Take 2 tablets by mouth daily. 50 +     rosuvastatin (CRESTOR) 40 MG tablet TAKE 1 TABLET BY MOUTH EVERY DAY 90 tablet 3   sennosides-docusate sodium (SENOKOT-S) 8.6-50 MG tablet Take 1 tablet by mouth daily with supper.     sertraline (ZOLOFT) 100 MG tablet Take 1 tablet (100 mg total) by mouth daily. 90 tablet 1   silodosin (RAPAFLO) 4 MG CAPS capsule Take 4 mg by mouth daily with breakfast.     VITAMIN D PO Take 1 capsule by mouth daily.     No current facility-administered medications for this visit.    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   LIMA to LAD, Sequential  SVG to OM1-OM3, SVG to RCA, EVH via left thigh   Schizophrenia (HCC)    Severe aortic stenosis  Family History  Problem Relation Age of Onset   Hypertension Mother    Heart attack Father    Hypertension Sister    Hypertension Brother     Social History   Socioeconomic History   Marital status: Married    Spouse name: Not on file   Number of children: Not on file   Years of education: Not on file   Highest education level: Not on file  Occupational History   Not on file  Tobacco Use   Smoking status: Never   Smokeless tobacco: Never  Vaping Use   Vaping Use: Never used  Substance and Sexual Activity   Alcohol use: No   Drug use: Never   Sexual activity: Not on file  Other Topics Concern   Not on file  Social History Narrative   Not on file   Social Determinants of Health   Financial Resource Strain: Not on file  Food Insecurity: Not on file  Transportation Needs: Not on file  Physical Activity: Not on file  Stress: Not on file  Social Connections: Not on file  Intimate Partner Violence: Not on file    Past Medical History, Surgical history, Social history, and Family history were reviewed and updated as appropriate.   Please see review of systems for further details on the patient's review from today.   Objective:   Physical Exam:  There were no vitals taken for this visit.  Physical Exam Constitutional:      General: He is not in acute distress. Musculoskeletal:        General: No deformity.  Neurological:     Mental Status: He is alert and oriented to person, place, and time. Mental status is at baseline.     Cranial Nerves: No dysarthria.     Comments: Mild tongue and buccal movements minimal. Mainly slow opening and closing of mouth and less tongue rolling.  milder with less distress from it than in the past Chronically slow gait with short steps and reduced arm swing that predates antipsychotic but might be a bit worse over the last year   Psychiatric:        Attention and Perception: Attention and perception normal. He does not perceive auditory or visual hallucinations.        Mood and Affect: Mood is anxious. Mood is not depressed. Affect is not labile, blunt, angry or inappropriate.        Speech: Speech normal.        Behavior: Behavior is slowed. Behavior is cooperative.        Thought Content: Thought content normal. Thought content is not paranoid or delusional. Thought content does not include homicidal or suicidal ideation. Thought content does not include suicidal plan.        Cognition and Memory: Cognition normal. He exhibits impaired recent memory.        Judgment: Judgment normal.     Comments: Insight and judgment fair.      Lab Review:     Component Value Date/Time   NA 132 (L) 07/24/2020 0400   NA 130 (L) 07/09/2020 1508   K 3.4 (L) 07/24/2020 0400   CL 96 (L) 07/24/2020 0400   CO2 24 07/24/2020 0400   GLUCOSE 115 (H) 07/24/2020 0400   BUN 20 07/24/2020 0400   BUN 16 07/09/2020 1508   CREATININE 0.74 07/24/2020 0400   CALCIUM 7.6 (L) 07/24/2020 0400   PROT 6.6 07/17/2020 0920   PROT 6.6 07/09/2020 1508   ALBUMIN  3.8 07/17/2020 0920   ALBUMIN 4.4 07/09/2020 1508   AST 18 07/17/2020 0920   ALT 22 07/17/2020 0920   ALKPHOS 54 07/17/2020 0920   BILITOT 0.7 07/17/2020 0920   BILITOT 0.4 07/09/2020 1508   GFRNONAA >60 07/24/2020 0400   GFRAA >60 07/24/2020 0400       Component Value Date/Time   WBC 14.0 (H) 07/23/2020 0645   RBC 3.78 (L) 07/23/2020 0645   HGB 11.9 (L) 07/23/2020 0645   HGB 13.5 06/18/2020 1056   HCT 35.1 (L) 07/23/2020 0645   HCT 38.6 06/18/2020 1056   PLT 136 (L) 07/23/2020 0645   PLT 243 06/18/2020 1056   MCV 92.9 07/23/2020 0645   MCV 90 06/18/2020 1056   MCH 31.5 07/23/2020 0645   MCHC 33.9 07/23/2020 0645   RDW 13.7 07/23/2020 0645   RDW 13.0 06/18/2020 1056   LYMPHSABS 1.7 06/18/2020 1056   EOSABS 0.4 06/18/2020 1056   BASOSABS 0.0 06/18/2020 1056   I will   No results found for: "POCLITH", "LITHIUM"   No results found for: "PHENYTOIN", "PHENOBARB", "VALPROATE", "CBMZ"   [facility] Office Visit from 06/04/2020 in [facility] Total Score 22        .res Assessment: Plan:    Ector was seen today for follow-up, depression, anxiety and medication reaction.  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 We discussed the following.   We discussed Patient has history of severe recurrent major depression with psychotic features.  There are also elements of OCD.   His paranoid is improved.  General anxiety with some intrusive thoughts and Depression better with sertraline 100   Stopped Benadryl.Marland Kitchen  His son has schizophrenia so there may be a genetic predisposition to psychosis.  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  Both son and pt have history of slowness of gait, reduced arm swing , short steps that predate any history of antipsychotic.  He has benefited with reduction in anxiety from the use of sertraline . Still has some but it is manageable. Again his depression is better with the sertraline.  Less shame but that may be mild paranoia or anxiety and not depression. Better with  sertraline to 100 mg bc of "nerves on edge".    Disc SE in detail and SSRI withdrawal sx.   Discussed his tendency to change his medicines on his own.  There is significant risk in doing so.   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  There is a risk of recurrence if he goes too low or if he goes off of it which is his intention.  He was educated that TD tends to risk turn and recur if you stop the [name].   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  He has not really tried TID recommend trial to  see if gait is better.  He's been taking '18mg'$  BID  FU 4 mos   Lynder Parents, MD, DFAPA     Future Appointments  Date Time Provider [place]  10/26/2022  2:00 PM Hilty, Nadean Corwin, MD [facility] None     No orders of the defined types were placed in this encounter.      -------------------------------

## 2022-10-13 NOTE — Patient Instructions (Signed)
Reduce [name] to 1 tablet with breakfast, lunch, supper to see if walking is better.

## 2022-10-22 ENCOUNTER — Other Ambulatory Visit: Payer: Self-pay | Admitting: Psychiatry

## 2022-10-22 DIAGNOSIS — F411 Generalized anxiety disorder: Secondary | ICD-10-CM

## 2022-10-22 DIAGNOSIS — F333 Major depressive disorder, recurrent, severe with psychotic symptoms: Secondary | ICD-10-CM

## 2022-10-23 ENCOUNTER — Other Ambulatory Visit: Payer: Self-pay | Admitting: Adult Health

## 2022-10-26 ENCOUNTER — Encounter: Payer: Self-pay | Admitting: Internal Medicine

## 2022-10-26 ENCOUNTER — Ambulatory Visit: Payer: Medicare HMO | Attending: Internal Medicine | Admitting: Internal Medicine

## 2022-10-26 VITALS — BP 100/61 | HR 52 | Ht 65.0 in | Wt 161.0 lb

## 2022-10-26 DIAGNOSIS — I2510 Atherosclerotic heart disease of native coronary artery without angina pectoris: Secondary | ICD-10-CM | POA: Diagnosis not present

## 2022-10-26 DIAGNOSIS — E785 Hyperlipidemia, unspecified: Secondary | ICD-10-CM

## 2022-10-26 MED ORDER — ATORVASTATIN CALCIUM 40 MG PO TABS: 40.0000 mg | ORAL_TABLET | Freq: Every day | ORAL | 3 refills | Status: DC

## 2022-10-26 NOTE — Progress Notes (Signed)
OFFICE CONSULT NOTE  Chief Complaint:  Follow-up severe aortic stenosis  Primary Care Physician: Antony Contras, MD  HPI:  Neil [name] is a 81 y.o. male who is being seen today for the evaluation of severe aortic stenosis at the request of Antony Contras, MD.  This is a pleasant 81 year old male kindly referred for evaluation and management of severe aortic stenosis.  Apparently he has been followed for an abnormal heart murmur which she says he is had for a long time, possibly since he was a child.  He recalls having had an echo with [facility] about 5 or 6 years ago and was told that he had aortic valve disease but clearly has not had follow-up closely until recently.  His PCP noted a change in the quality of his murmur and a repeat echo was ordered.  This was performed at [facility] with outside reading, and I personally reviewed the report but did not have access to the images.  This demonstrated an LVEF of 60 to 65% with mild LVH, normal left atrial size, moderate calcification of the mitral valve leaflets and minimal mitral regurgitation without mitral stenosis.  The aortic valve was moderately calcified and there was severe aortic stenosis.  The mean gradient across the aortic valve was 64 mmHg and a peak gradient of 90 mmHg.  The aortic root diameter was normal.  This was discussed with the patient and he was felt to be asymptomatic, flatly denying any chest pain, worsening shortness of breath with exertion, presyncope or syncopal episodes.  10/25/2020  Neil [name] returns today for follow-up.  This is the first time I have seen him since his initial office visit which time he was found to have critical aortic stenosis and sent fairly urgently for cardiac catheterization.  He subsequently underwent bioprosthetic AVR with Edwards Inspriris Resilia pericardial tissue valve (21 mm) and aortic root enlargement using bovine pericardial patch as well as CABG x4 (LIMA to LAD, SVG to distal right coronary,  SVG to OM1 and sequential SVG to OM 3).  While this went well there was some postoperative bleeding and he was taken back to the operating room later for reexploration which demonstrated diffuse coagulopathy and bleeding from the chest wall. He was ultimately discharged.  In follow-up he was noted to have complaints of increasing lower extremity edema and dyspnea.  Chest x-ray was performed and demonstrated interval development of moderate left pleural effusion.  He subsequently underwent thoracentesis on the left on August 26 with marked improvement in his symptoms.  No recurrence was noted thereafter.  He was last seen by Dr. Roxy Manns on September 23, 2020 and felt to be doing well.  He had deferred management of several medications and a repeat echo to me.  This is following an echo postoperatively which showed some reduction in LVEF down to 45 to 50% with incoordinate septal motion.  Is possible this degree of heart failure may have been related to his postoperative pleural effusion.  His wife reports he has been doing well with physical therapy.  He is now walking without a walker.  The only other concern was that he had low blood pressure.  His PCP took him off of his lisinopril due to this.  10/26/2022  Neil [name] is seen today in follow-up.  Overall he seems to be doing well and has recovered from aortic root and valve surgery as well as bypass grafting in 2021.  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  He had some difficulty in expressing himself today.  Cardiac standpoint he denies any chest pain or worsening shortness of breath.  He had a repeat echo in April of this year which showed no abnormalities of the aortic valve and normal LV function.  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   LIMA to LAD, Sequential SVG to OM1-OM3, SVG to RCA, EVH via left thigh   Schizophrenia ([place])    Severe aortic stenosis     Past Surgical Neil:  Procedure Laterality Date   AORTIC ROOT ENLARGEMENT N/A 07/19/2020   Procedure: AORTIC ROOT ENLARGEMENT USING 6CM x 8CM PERI-GUARD BOVINE PERICARDIUM PATCH;  Surgeon: Rexene Alberts, MD;  Location: [place];  Service: Open Heart Surgery;  Laterality: N/A;   AORTIC VALVE REPLACEMENT N/A 07/19/2020   Procedure: AORTIC VALVE REPLACEMENT (AVR) USING INSPIRIS RESILIA 21MM AORTIC VALVE;  Surgeon: Rexene Alberts, MD;  Location: [place];  Service: Open Heart Surgery;  Laterality: N/A;   CORONARY ARTERY BYPASS GRAFT N/A 07/19/2020   Procedure: CORONARY ARTERY BYPASS GRAFTING (CABG), ON PUMP, TIMES FOUR, USING LEFT INTERNAL MAMMARY ARTERY AND ENDOSCOPICALLY HARVESTED LEFT GREATER SAPHENOUS VEIN;  Surgeon: Rexene Alberts, MD;  Location: [place];  Service: Open Heart Surgery;  Laterality: N/A;   EXPLORATION POST OPERATIVE OPEN HEART N/A 07/19/2020   Procedure: EXPLORATION POST OPERATIVE OPEN HEART;  Surgeon: Rexene Alberts, MD;  Location: [place];  Service: Open Heart Surgery;  Laterality: N/A;   EYE SURGERY     Bialteral lower eye lids   FOOT SURGERY Left    IR THORACENTESIS ASP PLEURAL SPACE W/IMG GUIDE  08/08/2020   LIPOMA EXCISION     RIGHT AND LEFT HEART CATH N/A 06/26/2020   Procedure: RIGHT AND LEFT HEART CATH;  Surgeon: Sherren Mocha, MD;  Location: [place] [facility];  Service: Cardiovascular;  Laterality: N/A;   TEE WITHOUT CARDIOVERSION N/A 07/19/2020   Procedure: TRANSESOPHAGEAL ECHOCARDIOGRAM (TEE);  Surgeon: Rexene Alberts, MD;  Location: [place];  Service: Open Heart Surgery;  Laterality: N/A;    FAMHx:  Family Neil  Problem Relation Age of Onset   Hypertension Mother    Heart attack Father    Hypertension Sister     Hypertension Brother     SOCHx:   reports that he has never smoked. He has never used smokeless tobacco. He reports that he does not drink alcohol and does not use drugs.  ALLERGIES:  Allergies  Allergen Reactions   Amitriptyline Hcl Other (See Comments)   Aspirin Swelling and Other (See Comments)    Tongue swelling    Elemental Sulfur Other (See Comments)    Reaction:  Unknown    Perphenazine Other (See Comments)   Tetanus Toxoids Swelling and Other (See Comments)    Arm swelling   Tetanus-Diphtheria Toxoids Td Other (See Comments)    ROS: Pertinent items noted in HPI and remainder of comprehensive ROS otherwise negative.  HOME MEDS: Current Outpatient Medications on File Prior to Visit  Medication Sig Dispense Refill   acetaminophen (TYLENOL) 500 MG tablet Take 500 mg by mouth daily.  [name] 9 MG TABS TAKE 1 TABLET BY MOUTH FOUR TIMES A DAY 120 tablet 5   clopidogrel (PLAVIX) 75 MG tablet TAKE 1 TABLET BY MOUTH EVERY DAY 90 tablet 3   metoprolol tartrate (LOPRESSOR) 25 MG tablet Take 1 tablet (25 mg total) by mouth daily. 180 tablet 0   Multiple Vitamin (MULTIVITAMIN WITH MINERALS) TABS tablet Take 2 tablets by mouth daily. 50 +     sennosides-docusate sodium (SENOKOT-S) 8.6-50 MG tablet Take 1 tablet by mouth daily with supper.     sertraline (ZOLOFT) 100 MG tablet Take 1 tablet (100 mg total) by mouth daily. 90 tablet 1   silodosin (RAPAFLO) 4 MG CAPS capsule Take 4 mg by mouth daily with breakfast.     VITAMIN D PO Take 1 capsule by mouth daily.     rosuvastatin (CRESTOR) 40 MG tablet TAKE 1 TABLET BY MOUTH EVERY DAY (Patient not taking: Reported on 10/26/2022) 90 tablet 3   No current facility-administered medications on file prior to visit.    LABS/IMAGING: No results found for this or any previous visit (from the past 48 hour(s)). No results found.  LIPID PANEL:    Component Value Date/Time   CHOL 148 01/23/2021 1049   TRIG 50 01/23/2021 1049   HDL 58  01/23/2021 1049   CHOLHDL 2.6 01/23/2021 1049   LDLCALC 79 01/23/2021 1049    WEIGHTS: Wt Readings from Last 3 Encounters:  10/26/22 161 lb (73 kg)  10/24/21 147 lb 12.8 oz (67 kg)  10/25/20 128 lb 9.6 oz (58.3 kg)    VITALS: BP 100/61 (BP Location: Left Arm, Patient Position: Sitting)   Pulse (!) 52   Ht '5\' 5"'$  (1.651 m)   Wt 161 lb (73 kg)   SpO2 96%   BMI 26.79 kg/m   EXAM: General appearance: alert and no distress Neck: no carotid bruit, no JVD and thyroid not enlarged, symmetric, no tenderness/mass/nodules Lungs: clear to auscultation bilaterally Heart: regular rate and rhythm, S1: normal, S2: decreased intensity and systolic murmur: systolic ejection 3/6, harsh at 2nd right intercostal space Abdomen: soft, non-tender; bowel sounds normal; no masses,  no organomegaly Extremities: extremities normal, atraumatic, no cyanosis or edema Pulses: 2+ and symmetric Skin: Skin color, texture, turgor normal. No rashes or lesions Neurologic: Grossly normal Psych: Pleasant  EKG: Sinus bradycardia 52, minimal voltage criteria for LVH-personally reviewed  ASSESSMENT: Severe asymptomatic aortic stenosis-mean gradient 64 mmHg Status post bioprosthetic AVR (Edwards Inspiris Resilia 21 mm), aortic root patch enlargment (07/2020) Status post CABG x4 (LIMA to LAD, SVG to distal RCA, SVG to OM1 and sequential SVG to OM 3) - (07/2020) Dyslipidemia Depression with TD  PLAN: 1.   Mr. Kuyper seems to be doing well from a cardiac standpoint.  He had a repeat echo in April 2023 which showed normal systolic function and stable aortic valve gradient.  He denies any chest pain.  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  He has had some difficulty ambulating as well.  From a cardiac standpoint would recommend follow-up annually and will consider repeat imaging every few years or sooner as necessary.  Pixie Casino, MD, [facility], [place] Director of the Advanced Lipid Disorders &  Cardiovascular Risk Reduction Clinic Diplomate of the American Board of Clinical Lipidology Attending Cardiologist  Direct Dial: [phone]  Fax: [phone]  Website:  [email or web address] 10/26/2022, 2:34 PM

## 2022-10-26 NOTE — Patient Instructions (Addendum)
Medication Instructions:  START Lipitor 40 mg daily STOP Crestor   *If you need a refill on your cardiac medications before your next appointment, please call your pharmacy*   Lab Work: LIPID in 3 months   If you have labs (blood work) drawn today and your tests are completely normal, you will receive your results only by: [place] (if you have MyChart) OR A paper copy in the mail If you have any lab test that is abnormal or we need to change your treatment, we will call you to review the results.   Follow-Up: At [facility], you and your health needs are our priority.  As part of our continuing mission to provide you with exceptional heart care, we have created designated Provider Care Teams.  These Care Teams include your primary Cardiologist (physician) and Advanced Practice Providers (APPs -  Physician Assistants and Nurse Practitioners) who all work together to provide you with the care you need, when you need it.  We recommend signing up for the patient portal called "MyChart".  Sign up information is provided on this After Visit Summary.  MyChart is used to connect with patients for Virtual Visits (Telemedicine).  Patients are able to view lab/test results, encounter notes, upcoming appointments, etc.  Non-urgent messages can be sent to your provider as well.   To learn more about what you can do with MyChart, go to [email or web address].    Your next appointment:   12 month(s)  The format for your next appointment:   In Person  Provider:   Pixie Casino, MD

## 2022-11-02 NOTE — Addendum Note (Signed)
Addended by: Hillery Aldo A on: 11/02/2022 04:06 PM   Modules accepted: Orders

## 2022-11-27 ENCOUNTER — Telehealth: Payer: Self-pay | Admitting: Internal Medicine

## 2022-11-27 MED ORDER — ATORVASTATIN CALCIUM 20 MG PO TABS: 20.0000 mg | ORAL_TABLET | Freq: Every day | ORAL | 3 refills | Status: DC

## 2022-11-27 NOTE — Telephone Encounter (Signed)
Stop the statin - will consider some other alterative in the future.  Dr. Debara Pickett

## 2022-11-27 NOTE — Addendum Note (Signed)
Addended by: Betha Loa F on: 11/27/2022 03:23 PM   Modules accepted: Orders

## 2022-11-27 NOTE — Telephone Encounter (Signed)
  Pt c/o medication issue:  1. Name of Medication: atorvastatin (LIPITOR) 40 MG tablet   2. How are you currently taking this medication (dosage and times per day)?   Take 1 tablet (40 mg total) by mouth daily    3. Are you having a reaction (difficulty breathing--STAT)? No   4. What is your medication issue? Pt's wife said, pt is still having the same side effect taking this new static drug. She would like to speak with a nurse to discuss

## 2022-11-27 NOTE — Telephone Encounter (Signed)
Spoke with patient informed to stop taking atorvastatin. He stated he stopped taking it 3 days ago and he has no more diarrhea. I told him that is not what his wife told me earlier. He said she was wrong. Patient is now asking if he can take '20mg'$  atorvastatin instead of '40mg'$ . He said the diarrhea "wasn't that bad." Please advise.

## 2022-11-27 NOTE — Telephone Encounter (Signed)
LMTCB

## 2022-11-27 NOTE — Telephone Encounter (Signed)
Pt's wife returning call.

## 2022-11-27 NOTE — Telephone Encounter (Signed)
Wife stated that after changing statins, patient is still having diarrhea. Please advise.

## 2022-11-27 NOTE — Telephone Encounter (Signed)
Patient napping. Spoke with wife to inform her that patient can take 1/2 of atorvastatin ('20mg'$ ). Wife stated they will cut the pill in half. They will contact clinic next week to advise [place] on how patient is doing on the lower dose. Med list updated.

## 2022-11-30 ENCOUNTER — Other Ambulatory Visit: Payer: Self-pay | Admitting: Psychiatry

## 2022-11-30 DIAGNOSIS — F333 Major depressive disorder, recurrent, severe with psychotic symptoms: Secondary | ICD-10-CM

## 2022-11-30 DIAGNOSIS — F411 Generalized anxiety disorder: Secondary | ICD-10-CM

## 2022-12-15 ENCOUNTER — Telehealth: Payer: Self-pay | Admitting: Internal Medicine

## 2022-12-15 NOTE — Telephone Encounter (Signed)
Wife stated patient is nearly out of metoprolol tartrate 51m. According to spouse, patient was taking it twice daily since 2021 on d/c from hospital. Lately, she noticed on the bottle that patient was to take it once daily. I checked with patient's pharmacy. Met tart was changed to once daily between the dates of 09/28/22 and 10/26/22. There are no orders for the change to daily. While on phone, BP 128/78, P 84. Wife stated patient has been taking a daily dose for the past week and feels less tired. Should patient be on metoprolol tartrate 263mtwice daily or daily?

## 2022-12-15 NOTE — Telephone Encounter (Signed)
Pt c/o medication issue:  1. Name of Medication:   metoprolol tartrate (LOPRESSOR) 25 MG tablet    2. How are you currently taking this medication (dosage and times per day)?   Take 1 tablet (25 mg total) by mouth daily.    3. Are you having a reaction (difficulty breathing--STAT)? No  4. What is your medication issue? Pt's wife states that she has been giving pt 2 tablets daily. She states that pt is running low on medication due to this. She would like a callback regarding this matter

## 2022-12-18 ENCOUNTER — Other Ambulatory Visit: Payer: Self-pay

## 2022-12-18 ENCOUNTER — Telehealth: Payer: Self-pay | Admitting: Internal Medicine

## 2022-12-18 MED ORDER — METOPROLOL SUCCINATE ER 25 MG PO TB24: 25.0000 mg | ORAL_TABLET | Freq: Every day | ORAL | 3 refills | Status: DC

## 2022-12-18 MED ORDER — METOPROLOL TARTRATE 25 MG PO TABS: 25.0000 mg | ORAL_TABLET | Freq: Every day | ORAL | 1 refills | Status: DC

## 2022-12-18 NOTE — Telephone Encounter (Signed)
Spoke with wife who wanted to verify if patient should be on metoprolol tartrate '25mg'$  daily or twice daily. Refill sent to patient's pharmacy for daily (until further notice).

## 2022-12-18 NOTE — Telephone Encounter (Signed)
Patient's wife advised that Dr. Debara Pickett approved met tart '25mg'$  daily.

## 2022-12-18 NOTE — Telephone Encounter (Signed)
Patient's wife called and wanted to talk with Dr. Debara Pickett or nurse in regards to patients medication

## 2022-12-18 NOTE — Telephone Encounter (Addendum)
LMTCB regarding changing met tart to Toprol '25mg'$  daily. Prescription sent to patient's pharmacy. At 10 AM, wife informed of medication changes. She will keep BP/P diary and let our clinic know if patient has any issues with the Toprol.

## 2022-12-24 MED ORDER — ATORVASTATIN CALCIUM 20 MG PO TABS: 20.0000 mg | ORAL_TABLET | Freq: Every day | ORAL | 3 refills | Status: DC

## 2022-12-24 NOTE — Telephone Encounter (Signed)
Spoke with spouse who wanted to make sure the correct medications were sent to patient's pharmacy. Explained to spouse that Toprol '25mg'$  daily was sent in and that pateint's med list has been updated for Toprol and atorvastatin '20mg'$  tablets. She has enough of the '40mg'$  to cut, but prefers to have the '20mg'$  tablets. Changed from no print to print so she can get the atorvastatin '20mg'$  tablets.

## 2022-12-24 NOTE — Addendum Note (Signed)
Addended by: Betha Loa F on: 12/24/2022 09:09 AM   Modules accepted: Orders

## 2022-12-24 NOTE — Telephone Encounter (Signed)
Wife is calling back about the medication. Please advise

## 2023-02-10 ENCOUNTER — Ambulatory Visit (INDEPENDENT_AMBULATORY_CARE_PROVIDER_SITE_OTHER): Payer: Medicare HMO | Admitting: Psychiatry

## 2023-02-10 ENCOUNTER — Encounter: Payer: Self-pay | Admitting: Psychiatry

## 2023-02-10 DIAGNOSIS — R69 Illness, unspecified: Secondary | ICD-10-CM | POA: Diagnosis not present

## 2023-02-10 DIAGNOSIS — F061 Catatonic disorder due to known physiological condition: Secondary | ICD-10-CM | POA: Diagnosis not present

## 2023-02-10 DIAGNOSIS — R5383 Other fatigue: Secondary | ICD-10-CM

## 2023-02-10 DIAGNOSIS — F411 Generalized anxiety disorder: Secondary | ICD-10-CM

## 2023-02-10 DIAGNOSIS — G2401 Drug induced subacute dyskinesia: Secondary | ICD-10-CM | POA: Diagnosis not present

## 2023-02-10 DIAGNOSIS — F251 Schizoaffective disorder, depressive type: Secondary | ICD-10-CM | POA: Diagnosis not present

## 2023-02-10 DIAGNOSIS — F331 Major depressive disorder, recurrent, moderate: Secondary | ICD-10-CM

## 2023-02-10 MED ORDER — SERTRALINE HCL 100 MG PO TABS: 150.0000 mg | ORAL_TABLET | Freq: Every day | ORAL | 0 refills | Status: DC

## 2023-02-10 MED ORDER — AUSTEDO 9 MG PO TABS: 1.0000 | ORAL_TABLET | Freq: Four times a day (QID) | ORAL | 5 refills | Status: DC

## 2023-02-10 NOTE — Progress Notes (Addendum)
Neil [name] [id number] 1941-06-16 82 y.o.   Virtual Visit via Telephone Note  I connected with pt by telephone and verified that I am speaking with the correct person using two identifiers.   I discussed the limitations, risks, security and privacy concerns of performing an evaluation and management service by telephone and the availability of in person appointments. I also discussed with the patient that there may be a patient responsible charge related to this service. The patient expressed understanding and agreed to proceed.  I discussed the assessment and treatment plan with the patient. The patient was provided an opportunity to ask questions and all were answered. The patient agreed with the plan and demonstrated an understanding of the instructions.   The patient was advised to call back or seek an in-person evaluation if the symptoms worsen or if the condition fails to improve as anticipated.  I provided 30 minutes of non-face-to-face time during this encounter. The call started at 100 and ended at 130. The patient was located at home and the provider was located office.   Subjective:   Patient ID:  Neil [name] is a 82 y.o. (DOB 1941/01/21) male.  Chief Complaint:  Chief Complaint  Patient presents with   Follow-up   Depression   Anxiety   Medication Reaction    Medication Refill Pertinent negatives include no fatigue.  Anxiety Symptoms include nervous/anxious behavior. Patient reports no confusion, decreased concentration, dizziness or palpitations.    Depression        Associated symptoms include no decreased concentration, no fatigue and no appetite change.  Past medical history includes anxiety.     Neil [name] presents today for follow-up of psychosis, depression, and anxiety.  He has required an urgent appointment because of wanting further med changes and frequent phone calls with med changes since he was last here  When seen September 3 he was  switched off olanzapine because of EPS and switched on to Saphris which was raised to 5 mg nightly but only took that dose 1 time.  The rest of the time he took only 2.5 mg nightly.   He called back 5 days later stating he wanted to stop it because it was causing him to hold his mouth open and he was having a worsening time with tongue movements and felt "funny".  He was encouraged to give it more time but he would not agree to do so.  He stopped it Friday 9/11 and still felt the funny tense feeling.    When  seen September 01, 2019.  The decision was made to switch from [place] to [place] because of difficulty getting an adequate response and limited side effects from [place].  We also discussed the antipsychotic and the plan was to start Caplyta but those samples were not available so he decided to restart Saphris 2.5 mg nightly.  At the visit September 15, 2019 [name] was increased to 12 mg twice daily, Saphris was discontinued and Caplyta 42 mg daily was started because of his low EPS risk.  He still needed an antipsychotic.  He continued on sertraline 75 mg daily because he felt it helped his depression.  Wife called 10/8 and said the following: On Caplyta,  Thinks it is too strong.  His movements are slow, sleeping too much, drowsy during day ( doesn't really wake up until 4pm after sleeping all night). Takes him along time to think and respond. Had trouble with mouth and jaw before, and now he  is holding his mouth open all the time.  Paranoia is better though. Was told to take it 3 days weekly.    October 16 [name] increased to 18 mg BID  Called again November 9 stating he couldn't tolerate either med.  He stopped the Caplyta and reduced the [name] to 9 mg BID a few days ago.  CO rigidity in lower back and arms.  Still bothered by constant tongue movements.  No change in worry, mood but paranoia is reduced since Caplyta.   visit October 25, 2019.  He had stopped Caplyta.  He had also reduced  [name] to 9 mg twice daily on his own.  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  He is very impatient with med changes.  Therefore we did not start a new antipsychotic at the last visit.  He tends to make frequent phone calls and did call again on November 23 complaining of feeling very restless and difficulty sleeping.  Was prescribed clonazepam 0.5 mg twice daily.  He called back the next day stating he wanted to stop the [name] due to restlessness but he had never taken the prescribed clonazepam.  Using courage to follow the treatment plan and take the clonazepam.  He called again the next day stating he still wanted to stop [name] and the clonazepam was making his gait unsteady.  He was informed that if he stopped the [name] his mouth movements would get worse which is been a leading complaint but he could stop it if he wanted to.  December 4 he reports clonazepam has helped the anxiety and less guilt and less rigidity.  Taking TID and only SE is a little slower movements.  1 fall, slipped on Ba floor where water was lying.  No injury.   Gained 3# and better appetite for the first time in a long time.  CO tongue movements.  Restlessness is now bearable.  Less anxiety overall.  No AH.    visit December 4.  His psychosis was not severe and the decision was made not to start another antipsychotic but instead to try to optimize treatment for tardive dyskinesia and [name] was increased to 12 mg twice daily. He called back immediately after the head visit admitting he had given false information during the visit and that he not actually been taking the [name] in the way he told me during the visit making it necessary to change the [name] to 9 mg twice daily.  He was confronted about his noncompliance and warned that  he may be discharged from the practice if he continued to be misleading and noncompliant with the physician.  This is not only compromising care but it is increasing his own risk because he is giving false information.   visit December 21, 2019.  Because of his complaints about balance issues clonazepam was reduced and spread out to 0.25 mg 4 times daily.  Also for tardive dyskinesia symptoms [name] was increased to 12 mg twice daily.  Prior to that visit he admitted being generally noncompliant with the [name] making it difficult to judge what dose to prescribe.  He was continued on sertraline 75 mg daily as he felt that was helpful for his anxiety. Says he's compliant with med changes last visit.  Tongue movements are better but not gone. No problems  with the [name].  Stiffness comes and goes and located in head and eyes.  Denies it's vertigo.  Intensity is less.  If shuts his eyes it goes right away.  visit January 04, 2020.  [name] remained 12 mg twice daily and sertraline 75 mg daily.  The following was changed: switch to lorazepam bc tiredness and balance problems to Ativan. Yes DC clonazepam and start lorazepam 0.5 mg 4 times daily  seen February 8 the patient reports the following.  Yes it did help.  Not as slowed and balance is good now.  Still feels tired with tendency to want to lay down and knows he needs to work on this.  Anxiety is pretty fair.  We decided to reduce the lorazepam to 0.5 mg 3 times daily in hopes of improving energy and balance further.  No other meds were changed.  He is still not on an antipsychotic.  Anxiety comes and goes but sometimes able to get by with TID lorazepam and energy is better.  Wife says sometimes gets uptight and can't relax so needs QID.Pt reports that mood is anxiety aand not much depression and describes anxiety as worry. Anxiety symptoms include: worry not panic and moderate.   Sleep is better usually with hydroxyzine and sometimes lorazepam. Pt reports  that appetite is Much better with weight up to 131# as low as 120#.  Normal was 140#.Marland Kitchen Pt reports that energy is better and OK. Concentration is fair.. Suicidal thoughts:  None. Denies sig paranoid thought.  Plan: No med change except okay to use hydroxyzine 25 mg nightly for insomnia.  Per usual the patient tends to make multiple phone calls between appointments.  He is called about 7 times since March.  06/04/2020 appointment with the following noted: Seen with his wife. Reports hydroxyzine 25 mg is not helpful for sleep. Still moving tongue. No SE meds currently. Not depressed.  Is anxious persistent in the back ground and not even connected to thoughts. Little intrusive thougts.   Sleep concerns, to bed 830 an not sure when goes to sleep.  Sometimes can't go to sleep. Get OOB about 9 AM.  Usually goes  back to bed.  CO weakness for 18 mos. Needs Aortic valve replacement. He tried reducing lorazepam to 0.5 mg TID and had a lot of restlessness but some improvement in energy.  Wonders if fatigue related to meds or heart valve problem.  Probably will have heart surgery. Plan: Try to reduce lorazepam to 3 and 1/2 tablets daily.  to see if energy is better.  Keep the lowest effective dose possible.   Do not exceed 4 Ativan a day. No new antipsychotics started today. Increase [name] for 1 week to 1 of 6 mg and 1 of the 9 mg tablets twice daily for 1 week, Then increase again to 12 mg tablets 1 and 1/2 tablets twice daily or 18 mg twice daily. Continue sertraline 75 mg daily for anxiety.  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  Recovering slowly.  Just started walking a week or 2 ago. [place] P[place] handles the meds. [name] doesn't control tongue movements but it helps. Done fine without lorazepam and less drowsy per wife. Taking hydroxyzine 25 mg for sleep with Tylenol.  Sleeping well per wife.   Wife thinks overall he's doing well from mental health perspective and  seems calmer.  Pt says the psychosis has lightened up and he's grateful for that.  No depression.   Wants to  try to stop hydroxyzine. Wants to try to reduce sertraline to 50 mg to prevent cutting.   Plan: He wants to reduce sertraline to 50 daily.  He says he's less depressed and it has helped his anxiety. Disc the risk of increased depression and anxiety but since 3 mos out from surgery OK. DC hydroxyzine if possible.  11/05/2020 patient called for refill of hydroxyzine and was taking 12.5 mg.  Dosage reduced to 10 mg.  02/14/2021 phone call from his wife Bonita Quin concerned about his tongue movements and his anxiety. MD response: The patient and his wife are elderly.  He seems to forget the purpose of the medication and have some difficulty comprehending the conditions for which he is treated as well as the purpose of the meds and side effect issues. He is also somewhat impatient and anxious wanting changes to be made quickly. The answer to the question is the [name] is to treat the symptoms of his tongue movements.  If he stops the [name] the tongue movements will be worse.  The [name] is not causing the tongue movements.  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 He has an appointment with me in 3 weeks.  I do not want to make any med changes over the phone or even med adjustments.  I would urge him to come into the office at that visit.  I do not do not want a televisit.  I want to be able to see him so that I can observe his tongue movements.  03/26/2021 appointment with following noted: Taking [name] 18 mg BID. Claims compliance. Started to feel more fatigued and went to See Dr. Tally Joe 02/25/21 with normal labs including: Comprehensive metabolic panel normal except sodium 132, normal CBC, TSH 2.24, B12 538 normal Fatigue is some better 20%.  Wants to get back in bed after breakfast.  For unclear reasons hydroxyzine raised again to 25 mg nightly. Some initial insomnia and  some awakening.  Nap 1 1/2 hour in AM and another hour in afternoon. Goes to bed 730PM and up 8 AM. Tongue movements persisted until 2 weeks ago and has gotten better. Reduced sertraline to 50 mg and anxiety is no worse. Not depressed. Reads about sE [name] and wonders if it is causing fatigue.  Plan: To address fatigue and cognition: Reduce hydroxyzine again to 10 mg and try to stop it. Stop hydroxyzine 25 mg at night Start hydroxyzine 10 mg tablets 1 at night Start Dayvigo 5 mg tablet 1 at night using samples.  If it helps then stop the hydroxyzine to help your fatigue and memory Spending way too much time in bed.  03/31/2021 phone call: Oliva Bustard is not covered until Belsomra is tried.  Patient was to pick up samples of Belsomra. 04/02/2021 phone call: Complaining of too sleepy on trazodone 50 mg nightly.  Suggested they try half of tablet and if is still a problem stop it. 04/29/2021 phone call with questions and concerns about the [name].  Wife saying that he had problems with rigidity in his arms and hands. MD response: The patient and his wife call fairly often with complaints that very between excessive tongue movements associated with tardive dyskinesia and on the alternative side the sort of complaints that could be related to parkinsonian side effects of [name].  So if he takes too little [name] his tongue movements will get worse but if he takes too much he may shuffle more than usual.  Even at his baseline before  he took [name] he tended to shuffle his feet and have a reduced arm swing.  He is currently on 12 mg [name] 2 tablets twice daily.  If we reduce it we should reduce it only slightly to 12+9 mg tablets 1 of each twice daily.  My advice is that we not make the any med change until his next appointment.  At his last appointment he stated the tongue movements had only recently gotten better and if we reduce the dose they may get worse again.  06/25/2021 appointment with the  following noted: He reduced [name] on his own to 1 and 1/4 of 9 mg tablets about a week ago bc sleepiness, rigidity of arms and legs and fatigue partly better.  Tongue movements have come back a little. No sleep meds needed.  Tired and lays in bed a lot. Minimal anxiety and depression. Has stopped hydroxyzine. Started OTC TremorSmoothe prn. Wants to come off [name] bc sx noted.   Always tended to sleep a lot. Occ recurring disturbing dream. Plan: To address fatigue, sleepiness and cognition: After another week of [name] 9 mg tablets 1 and 1/4 tablets twice daily he can reduce it to 1 tablet twice daily but risk return of TD coming off of this as he wants to do. TD is managed.  09/25/2021 appointment with the following noted: He reduced [name] from 18 mg BID to 13.5 mg BID and feels it's better with less general tension in his body.  Lately energy better but not right after reducing the med. No worsening of tongue movements which come and go. Depression wife says the weakness he feels is psychological bc likes to sleep a lot.  Not sure how long it's been going on.  To bed 7:30 and gets up about 830.  Walks and then back to bed until about 1130.  Sleeps some.  Chronic tiredness.   Still takes Zoloft 50.   Nervousness is there in his body but not worrying about anything.   pLan no med changes  10/06/2021 phone call asking to increase [name] to 12 mg twice daily.  That dosage was changed as requested.  10/15/2021 phone call from patient's wife Bonita Quin stating bupropion ER 100 mg every morning made him too hyper and nervous and he stopped it.  10/21/2021 phone call from wife stating with the change in [name] dose the wife thinks his TD symptoms are worse.  On 12 mg twice daily versus 9 mg tablets 1-1/2 twice daily.  So the 9 mg dose was sent back in.  12/03/2021 appointment with the following noted: Taking [name] 9 mg TID, sertraline 50, no Wellbutrin. Fair.  Tiredness not as pronounced as  it was.  Not sure why. Not sure about depression, I have something.  Can't describe it but not worrying a lot.  Has had a number of stressors.  Nerves on edge at times.  Trouble resting but still sleeps a lot.  It comes and goes.  Sleeps 10 hours. Not that productive and doesn't do much.  Enjoyed son and his family for Christmas. TD better not gone.  Worse under stress.   Not going to church DT fear of Covid and history of heart operation. Plan: Increase  sertraline to 75 mg bc of "nerves on edge".    02/09/22 appt noted: Increase apptetite and  wt from 145 to 159#.  In the last couple of months. Less anxiety with nerves less on edge. [name] workig fair but tongue still moves. Depression better  not gone. Energy better but fair. Sleep with benadryl 4 nightly. Plan: continue sertraline 75 and [name] 9 mg TID-QID  TC 03/06/22  4:09 PM Note If his anxiety persists into next week he can increase sertraline to 100 mg daily for anxiety.     06/08/22 appt noted: Continues sertraline 100 mg working better, [name] 9 mg 4 daily Tolerating meds.  Less SE  now than in the past but slow gait with short steps at times. Thinks tongue movements better with more [name]. Anxiety better controlled.  Depression not too bad. Went to church first time Sunday and that helped.  Had avoided bc Covid fears. Plans to attend person on Sunday mornings. Otherwise health is OK. Son helpful but fixated on rapture. Plan: Continue [name] 9 mg tablets 1 QID and sertraline 100   10/13/22 appt noted: Continues sertraline 100 mg working better, [name] 9 mg 4 daily Fair  with walking as the only real problem.  Sometimes short steps and not much endurance.  Has had PT in the past. Tongue movmements generally controlled.  When cut back to [name] TID he complained of more tongue movements. He also tried stopping it all together also. 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  He has not really tried TID recommend trial to see if gait is better.    02/10/23 appt noted: Dropped [name] to TID and worsening TD including jaw and tongue movementso increased back to QID.  Took a while to notice any difference, but better now. Overall now doing better but "ill at ease at times" and will call for Bonita Quin almost unconsciously with some anxiety. It comes and goes daily.  There most of the time and can occur without triggers.   Not much depression.  Denies sign avoidance.   Sleep is better lately.  Taking melatonin but sleep is irregular.  Not drowsy in the day.  Some napping.  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  Son Loraine Leriche living with them.  Become more committed [place].  He has schizophrenia.   Past Psychiatric Medication Trials: Mirtazapine,  olanzapine 5 EPS, perphenazine started TD with mouth px,  Saphris 5 mg and stopped due to EPS complaints and feeling "funny" Caplyta side effects risperidone 2 tremors  Buspirone,   amitriptyline 25,  sertraline 50 Wellbutrin SR 100 nervous Hydroxyzine 10-20 Clonazepam 0.5 TID dizzy and sleepy Ativan 0.5 mg QID Ingrezza and  [name] 18 mg BID  Review of Systems:  Review of Systems  Constitutional:  Negative for appetite change and fatigue.  Cardiovascular:  Negative for palpitations.  Gastrointestinal:  Negative for constipation.  Neurological:  Negative for dizziness and syncope.       No falls.  Slow gait.   Psychiatric/Behavioral:  Positive for depression. Negative for agitation, behavioral problems, confusion, decreased concentration, dysphoric mood, hallucinations, self-injury and sleep disturbance. The patient is nervous/anxious. The patient is not hyperactive.        Sleepy  CABG 07/19/20  Medications: I have reviewed the patient's current medications.  Current Outpatient Medications  Medication Sig Dispense Refill   acetaminophen (TYLENOL) 500  MG tablet Take 500 mg by mouth daily.     atorvastatin (LIPITOR) 20 MG tablet Take 1 tablet (20 mg total) by mouth daily. 90 tablet 3   clopidogrel (PLAVIX) 75 MG tablet TAKE 1 TABLET BY MOUTH EVERY DAY 90 tablet 3   metoprolol succinate (  TOPROL XL) 25 MG 24 hr tablet Take 1 tablet (25 mg total) by mouth daily. 90 tablet 3   Multiple Vitamin (MULTIVITAMIN WITH MINERALS) TABS tablet Take 2 tablets by mouth daily. 50 +     sennosides-docusate sodium (SENOKOT-S) 8.6-50 MG tablet Take 1 tablet by mouth daily with supper.     silodosin (RAPAFLO) 4 MG CAPS capsule Take 4 mg by mouth daily with breakfast.     VITAMIN D PO Take 1 capsule by mouth daily.     Deutetrabenazine ([name]) 9 MG TABS Take 1 tablet by mouth 4 (four) times daily. 120 tablet 5   sertraline (ZOLOFT) 100 MG tablet Take 1.5 tablets (150 mg total) by mouth daily. 135 tablet 0   No current facility-administered medications for this visit.    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   LIMA to LAD, Sequential SVG to OM1-OM3, SVG to RCA, EVH via left thigh   Schizophrenia (HCC)    Severe aortic stenosis     Family History  Problem Relation Age of Onset   Hypertension Mother    Heart attack Father    Hypertension Sister    Hypertension Brother     Social History   Socioeconomic  History   Marital status: Married    Spouse name: Not on file   Number of children: Not on file   Years of education: Not on file   Highest education level: Not on file  Occupational History   Not on file  Tobacco Use   Smoking status: Never   Smokeless tobacco: Never  Vaping Use   Vaping Use: Never used  Substance and Sexual Activity   Alcohol use: No   Drug use: Never   Sexual activity: Not on file  Other Topics Concern   Not on file  Social History Narrative   Not on file   Social Determinants of Health   Financial Resource Strain: Not on file  Food Insecurity: Not on file  Transportation Needs: Not on file  Physical Activity: Not on file  Stress: Not on file  Social Connections: Not on file  Intimate Partner Violence: Not on file    Past Medical History, Surgical history, Social history, and Family history were reviewed and updated as appropriate.   Please see review of systems for further details on the patient's review from today.   Objective:   Physical Exam:  There were no vitals taken for this visit.  Physical Exam HENT:     Right Ear: Decreased hearing noted.  Neurological:     Mental Status: He is alert and oriented to person, place, and time.     Cranial Nerves: No dysarthria.  Psychiatric:        Attention and Perception: Attention and perception normal.  Mood and Affect: Mood is anxious. Mood is not depressed.        Speech: Speech normal.        Behavior: Behavior is cooperative.        Thought Content: Thought content normal. Thought content is not paranoid or delusional. Thought content does not include homicidal or suicidal ideation. Thought content does not include suicidal plan.        Cognition and Memory: Cognition and memory normal.        Judgment: Judgment normal.     Comments: Insight intact     Lab Review:     Component Value Date/Time   NA 132 (L) 07/24/2020 0400   NA 130 (L) 07/09/2020 1508   K 3.4 (L) 07/24/2020 0400    CL 96 (L) 07/24/2020 0400   CO2 24 07/24/2020 0400   GLUCOSE 115 (H) 07/24/2020 0400   BUN 20 07/24/2020 0400   BUN 16 07/09/2020 1508   CREATININE 0.74 07/24/2020 0400   CALCIUM 7.6 (L) 07/24/2020 0400   PROT 6.6 07/17/2020 0920   PROT 6.6 07/09/2020 1508   ALBUMIN 3.8 07/17/2020 0920   ALBUMIN 4.4 07/09/2020 1508   AST 18 07/17/2020 0920   ALT 22 07/17/2020 0920   ALKPHOS 54 07/17/2020 0920   BILITOT 0.7 07/17/2020 0920   BILITOT 0.4 07/09/2020 1508   GFRNONAA >60 07/24/2020 0400   GFRAA >60 07/24/2020 0400       Component Value Date/Time   WBC 14.0 (H) 07/23/2020 0645   RBC 3.78 (L) 07/23/2020 0645   HGB 11.9 (L) 07/23/2020 0645   HGB 13.5 06/18/2020 1056   HCT 35.1 (L) 07/23/2020 0645   HCT 38.6 06/18/2020 1056   PLT 136 (L) 07/23/2020 0645   PLT 243 06/18/2020 1056   MCV 92.9 07/23/2020 0645   MCV 90 06/18/2020 1056   MCH 31.5 07/23/2020 0645   MCHC 33.9 07/23/2020 0645   RDW 13.7 07/23/2020 0645   RDW 13.0 06/18/2020 1056   LYMPHSABS 1.7 06/18/2020 1056   EOSABS 0.4 06/18/2020 1056   BASOSABS 0.0 06/18/2020 1056   I will  No results found for: "POCLITH", "LITHIUM"   No results found for: "PHENYTOIN", "PHENOBARB", "VALPROATE", "CBMZ"   AIMS    Flowsheet Row Office Visit from 06/04/2020 in [facility]  AIMS Total Score 22        .res Assessment: Plan:    Nabeel was seen today for follow-up, depression, anxiety and medication reaction.  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  Generalized anxiety disorder -     sertraline (ZOLOFT) 100 MG tablet; Take 1.5 tablets (150 mg total) by mouth daily.  Tardive dyskinesia -     Deutetrabenazine ([name]) 9 MG TABS; Take 1 tablet by mouth 4 (four) times daily.  Fatigue, unspecified  type    Greater than 50% of 30 min non-face to face time with patient was spent on counseling and coordination of care. We discussed the following.   We discussed Patient has history of severe recurrent major depression with psychotic features.  There are also elements of OCD.   His paranoid is improved.    Stopped Benadryl.  Taking melatonin.  Disc sleep hygiene.  His son has schizophrenia so there may be a genetic predisposition to psychosis.  I  had suspected  that he has paranoid schizophrenia that has been previously diagnosed as depression with psychosis but in either case his sx remitted without currrent use of antipsychotic.  Both son and pt have history of slowness of gait, reduced arm swing , short steps that predate any history of antipsychotic.  He has benefited with reduction in anxiety from the use of sertraline . Still has frequent anxiey daily and is probably worse since the last visit. Again his depression is better with the sertraline.  Less shame but that may be mild paranoia or anxiety and not depression. increase sertraline to 150 mg bc anxiety     Disc SE in detail and SSRI withdrawal sx. Call if a problem  Discussed his tendency to change his medicines on his own.  There is significant risk in doing so.  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  He was educated that TD tends to risk turn and recur if you stop the [name].   Continue [name] 9 mg tablets 1 QID TD is manageable but not gone  Next appt needs to be in person FU 3 mos   Meredith Staggers, MD, DFAPA     Future Appointments  Date Time Provider Department Center  05/12/2023  1:00 PM Cottle, Steva Ready., MD [facility] None      No orders of the defined types were placed in this encounter.      -------------------------------

## 2023-03-02 ENCOUNTER — Telehealth: Payer: Self-pay | Admitting: Psychiatry

## 2023-03-02 NOTE — Telephone Encounter (Signed)
Sertraline was increased 2/28. Wife said the patient does not know how to describe what he is feeling, just that he is uncomfortable. She said he is calling for her frequently.  Next appt 5/29.

## 2023-03-02 NOTE — Telephone Encounter (Signed)
This sounds the same as his complaints at the visit 2/28 "Overall now doing better but "ill at ease at times" and will call for Neil [name] almost unconsciously with some anxiety. It comes and goes daily. There most of the time and can occur without triggers. "  We raised the dose to 150 mg sertraline at that time.  Give it 2 more weeks bc most of the benefit will probably happen in the next 2 weeks.  If he is not improving by then we will reduce sertraline to 1 daily.

## 2023-03-02 NOTE — Telephone Encounter (Signed)
Wife-Linda lvm. Increase in Sertraline, Pt states he feels uncomfortable. This has not gotten better. RTC [phone]

## 2023-03-02 NOTE — Telephone Encounter (Signed)
Called wife to let her know recommendations and she had not increased dose from 100 to 150 mg. She thought one tablet was 150 mg.  I told her to cut pill in half and give 1 whole pill and 1 half pill. Asked that she let us know if 2 weeks how he was doing, sooner if needed.

## 2023-03-30 ENCOUNTER — Telehealth: Payer: Self-pay | Admitting: Psychiatry

## 2023-03-30 NOTE — Telephone Encounter (Signed)
For what purpose does he want to reduce the dose?

## 2023-03-30 NOTE — Telephone Encounter (Signed)
Pt's wife, Bonita Quin LVM :55p.  Wife is on [place]. She said Dr Jennelle Human wants the pt to cut back on the [name]. They need instructions on what to do.  Next appt 5/29

## 2023-03-30 NOTE — Telephone Encounter (Signed)
Called wife back. She thought you wanted him to reduce it. She said patient doesn't hear well. Your note said to continue dosing at 9 mg QID. Wife said she knows that when he has reduced it in the past the tongue issue increased. Will continue as prescribed.

## 2023-03-30 NOTE — Telephone Encounter (Signed)
Wife called to say that you wanted patient to reduce his [name] dose. I verified that he is taking 9 mg QID. Wife wants to know what to do going forward. I didn't see a reduction mentioned in your February note. F/U 5/29.   Discussed his tendency to change his medicines on his own.  There is significant risk in doing so.  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  He was educated that TD tends to risk turn and recur if you stop the [name].    Continue [name] 9 mg tablets 1 QID TD is manageable but not gone

## 2023-04-07 DIAGNOSIS — N401 Enlarged prostate with lower urinary tract symptoms: Secondary | ICD-10-CM | POA: Diagnosis not present

## 2023-04-07 DIAGNOSIS — R351 Nocturia: Secondary | ICD-10-CM | POA: Diagnosis not present

## 2023-04-07 DIAGNOSIS — N3941 Urge incontinence: Secondary | ICD-10-CM | POA: Diagnosis not present

## 2023-04-26 DIAGNOSIS — D1801 Hemangioma of skin and subcutaneous tissue: Secondary | ICD-10-CM | POA: Diagnosis not present

## 2023-04-26 DIAGNOSIS — L814 Other melanin hyperpigmentation: Secondary | ICD-10-CM | POA: Diagnosis not present

## 2023-04-26 DIAGNOSIS — L570 Actinic keratosis: Secondary | ICD-10-CM | POA: Diagnosis not present

## 2023-04-26 DIAGNOSIS — Z85828 Personal history of other malignant neoplasm of skin: Secondary | ICD-10-CM | POA: Diagnosis not present

## 2023-04-26 DIAGNOSIS — S20461A Insect bite (nonvenomous) of right back wall of thorax, initial encounter: Secondary | ICD-10-CM | POA: Diagnosis not present

## 2023-04-26 DIAGNOSIS — L821 Other seborrheic keratosis: Secondary | ICD-10-CM | POA: Diagnosis not present

## 2023-05-12 ENCOUNTER — Ambulatory Visit: Payer: Medicare HMO | Admitting: Psychiatry

## 2023-05-12 ENCOUNTER — Other Ambulatory Visit: Payer: Self-pay | Admitting: Psychiatry

## 2023-05-12 ENCOUNTER — Encounter: Payer: Self-pay | Admitting: Psychiatry

## 2023-05-12 DIAGNOSIS — F411 Generalized anxiety disorder: Secondary | ICD-10-CM

## 2023-05-12 DIAGNOSIS — R5383 Other fatigue: Secondary | ICD-10-CM | POA: Diagnosis not present

## 2023-05-12 DIAGNOSIS — F061 Catatonic disorder due to known physiological condition: Secondary | ICD-10-CM

## 2023-05-12 DIAGNOSIS — F5105 Insomnia due to other mental disorder: Secondary | ICD-10-CM

## 2023-05-12 DIAGNOSIS — G2119 Other drug induced secondary parkinsonism: Secondary | ICD-10-CM

## 2023-05-12 DIAGNOSIS — F251 Schizoaffective disorder, depressive type: Secondary | ICD-10-CM

## 2023-05-12 DIAGNOSIS — G2401 Drug induced subacute dyskinesia: Secondary | ICD-10-CM

## 2023-05-12 MED ORDER — SERTRALINE HCL 50 MG PO TABS: 150.0000 mg | ORAL_TABLET | Freq: Every day | ORAL | 1 refills | Status: DC

## 2023-05-12 MED ORDER — AUSTEDO 9 MG PO TABS: 1.0000 | ORAL_TABLET | Freq: Four times a day (QID) | ORAL | 5 refills | Status: DC

## 2023-05-12 NOTE — Progress Notes (Signed)
Neil [name] [id number] 11/15/1941 82 y.o.     Subjective:   Patient ID:  Neil [name] is a 82 y.o. (DOB [birth year]) male.  Chief Complaint:  Chief Complaint  Patient presents with   Follow-up   Depression   Anxiety   Other    TD    Medication Refill Pertinent negatives include no fatigue.  Anxiety Symptoms include nervous/anxious behavior. Patient reports no confusion, decreased concentration, dizziness or palpitations.    Depression        Associated symptoms include no decreased concentration, no fatigue and no appetite change.  Past medical history includes anxiety.     Neil [name] presents today for follow-up of psychosis, depression, and anxiety.  He has required an urgent appointment because of wanting further med changes and frequent phone calls with med changes since he was last here  When seen September 3 he was switched off olanzapine because of EPS and switched on to Saphris which was raised to 5 mg nightly but only took that dose 1 time.  The rest of the time he took only 2.5 mg nightly.   He called back 5 days later stating he wanted to stop it because it was causing him to hold his mouth open and he was having a worsening time with tongue movements and felt "funny".  He was encouraged to give it more time but he would not agree to do so.  He stopped it Friday 9/11 and still felt the funny tense feeling.    When  seen September 01, 2019.  The decision was made to switch from [place] to [place] because of difficulty getting an adequate response and limited side effects from [place].  We also discussed the antipsychotic and the plan was to start Caplyta but those samples were not available so he decided to restart Saphris 2.5 mg nightly.  At the visit September 15, 2019 [name] was increased to 12 mg twice daily, Saphris was discontinued and Caplyta 42 mg daily was started because of his low EPS risk.  He still needed an antipsychotic.  He continued on sertraline  75 mg daily because he felt it helped his depression.  Wife called 10/8 and said the following: On Caplyta,  Thinks it is too strong.  His movements are slow, sleeping too much, drowsy during day ( doesn't really wake up until 4pm after sleeping all night). Takes him along time to think and respond. Had trouble with mouth and jaw before, and now he is holding his mouth open all the time.  Paranoia is better though. Was told to take it 3 days weekly.    October 16 [name] increased to 18 mg BID  Called again November 9 stating he couldn't tolerate either med.  He stopped the Caplyta and reduced the [name] to 9 mg BID a few days ago.  CO rigidity in lower back and arms.  Still bothered by constant tongue movements.  No change in worry, mood but paranoia is reduced since Caplyta.   visit October 25, 2019.  He had stopped Caplyta.  He had also reduced [name] to 9 mg twice daily on his own.  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  He is very impatient with med changes.  Therefore we did not start a new antipsychotic at the last visit.  He tends to make frequent phone calls and did call again on November 23 complaining of feeling very restless and difficulty sleeping.  Was prescribed clonazepam 0.5 mg twice daily.  He called back the next day stating he wanted to stop the [name] due to restlessness but he had never taken the prescribed clonazepam.  Using courage to follow the treatment plan and take the clonazepam.  He called again the next day stating he still wanted to stop [name] and the clonazepam was making his gait unsteady.  He was informed that if he stopped the [name] his mouth movements would get worse which is been a leading complaint but he could stop it if he wanted to.  December 4 he reports  clonazepam has helped the anxiety and less guilt and less rigidity.  Taking TID and only SE is a little slower movements.  1 fall, slipped on Ba floor where water was lying.  No injury.   Gained 3# and better appetite for the first time in a long time.  CO tongue movements.  Restlessness is now bearable.  Less anxiety overall.  No AH.    visit December 4.  His psychosis was not severe and the decision was made not to start another antipsychotic but instead to try to optimize treatment for tardive dyskinesia and [name] was increased to 12 mg twice daily. He called back immediately after the head visit admitting he had given false information during the visit and that he not actually been taking the [name] in the way he told me during the visit making it necessary to change the [name] to 9 mg twice daily.  He was confronted about his noncompliance and warned that he may be discharged from the practice if he continued to be misleading and noncompliant with the physician.  This is not only compromising care but it is increasing his own risk because he is giving false information.   visit December 21, 2019.  Because of his complaints about balance issues clonazepam was reduced and spread out to 0.25 mg 4 times daily.  Also for tardive dyskinesia symptoms [name] was increased to 12 mg twice daily.  Prior to that visit he admitted being generally noncompliant with the [name] making it difficult to judge what dose to prescribe.  He was continued on sertraline 75 mg daily as he felt that was helpful for his anxiety. Says he's compliant with med changes last visit.  Tongue movements are better but not gone. No problems with the [name].  Stiffness comes and goes and located in head and eyes.  Denies it's vertigo.  Intensity is less.  If shuts his eyes it goes right away.  visit January 04, 2020.  [name] remained 12 mg twice daily and sertraline 75 mg daily.  The following was changed: switch to lorazepam bc  tiredness and balance problems to Ativan. Yes DC clonazepam and start lorazepam 0.5 mg 4 times daily  seen February 8 the patient reports the following.  Yes it did help.  Not as slowed and balance is good now.  Still feels tired with tendency to want to lay down and knows he needs to work on this.  Anxiety is pretty fair.  We decided to reduce the lorazepam to 0.5 mg 3 times daily in hopes of improving energy and balance further.  No other meds were changed.  He is still not on an antipsychotic.  Anxiety comes and goes but sometimes able to get by with TID lorazepam and energy  is better.  Wife says sometimes gets uptight and can't relax so needs QID.Pt reports that mood is anxiety aand not much depression and describes anxiety as worry. Anxiety symptoms include: worry not panic and moderate.   Sleep is better usually with hydroxyzine and sometimes lorazepam. Pt reports that appetite is Much better with weight up to 131# as low as 120#.  Normal was 140#.Marland Kitchen Pt reports that energy is better and OK. Concentration is fair.. Suicidal thoughts:  None. Denies sig paranoid thought.  Plan: No med change except okay to use hydroxyzine 25 mg nightly for insomnia.  Per usual the patient tends to make multiple phone calls between appointments.  He is called about 7 times since March.  06/04/2020 appointment with the following noted: Seen with his wife. Reports hydroxyzine 25 mg is not helpful for sleep. Still moving tongue. No SE meds currently. Not depressed.  Is anxious persistent in the back ground and not even connected to thoughts. Little intrusive thougts.   Sleep concerns, to bed 830 an not sure when goes to sleep.  Sometimes can't go to sleep. Get OOB about 9 AM.  Usually goes  back to bed.  CO weakness for 18 mos. Needs Aortic valve replacement. He tried reducing lorazepam to 0.5 mg TID and had a lot of restlessness but some improvement in energy.  Wonders if fatigue related to meds or heart valve problem.   Probably will have heart surgery. Plan: Try to reduce lorazepam to 3 and 1/2 tablets daily.  to see if energy is better.  Keep the lowest effective dose possible.   Do not exceed 4 Ativan a day. No new antipsychotics started today. Increase [name] for 1 week to 1 of 6 mg and 1 of the 9 mg tablets twice daily for 1 week, Then increase again to 12 mg tablets 1 and 1/2 tablets twice daily or 18 mg twice daily. Continue sertraline 75 mg daily for anxiety.  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  Recovering slowly.  Just started walking a week or 2 ago. [place] Picking handles the meds. [name] doesn't control tongue movements but it helps. Done fine without lorazepam and less drowsy per wife. Taking hydroxyzine 25 mg for sleep with Tylenol.  Sleeping well per wife.   Wife thinks overall he's doing well from mental health perspective and seems calmer.  Pt says the psychosis has lightened up and he's grateful for that.  No depression.   Wants to try to stop hydroxyzine. Wants to try to reduce sertraline to 50 mg to prevent cutting.   Plan: He wants to reduce sertraline to 50 daily.  He says he's less depressed and it has helped his anxiety. Disc the risk of increased depression and anxiety but since 3 mos out from surgery OK. DC hydroxyzine if possible.  11/05/2020 patient called for refill of hydroxyzine and was taking 12.5 mg.  Dosage reduced to 10 mg.  02/14/2021 phone call from his wife Bonita Quin concerned about his tongue movements and his anxiety. MD response: The patient and his wife are elderly.  He seems to forget the purpose of the medication and have some difficulty comprehending the conditions for which he is treated as well as the purpose of the meds and side effect issues. He is also somewhat impatient and anxious wanting changes to be made quickly. The answer to the question is the [name] is to treat the symptoms of his tongue movements.  If he stops the  [name] the tongue movements will be worse.  The [name] is not causing the tongue movements.  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 He has an appointment with me in 3 weeks.  I do not want to make any med changes over the phone or even med adjustments.  I would urge him to come into the office at that visit.  I do not do not want a televisit.  I want to be able to see him so that I can observe his tongue movements.  03/26/2021 appointment with following noted: Taking [name] 18 mg BID. Claims compliance. Started to feel more fatigued and went to See Dr. Tally Joe 02/25/21 with normal labs including: Comprehensive metabolic panel normal except sodium 132, normal CBC, TSH 2.24, B12 538 normal Fatigue is some better 20%.  Wants to get back in bed after breakfast.  For unclear reasons hydroxyzine raised again to 25 mg nightly. Some initial insomnia and some awakening.  Nap 1 1/2 hour in AM and another hour in afternoon. Goes to bed 730PM and up 8 AM. Tongue movements persisted until 2 weeks ago and has gotten better. Reduced sertraline to 50 mg and anxiety is no worse. Not depressed. Reads about sE [name] and wonders if it is causing fatigue.  Plan: To address fatigue and cognition: Reduce hydroxyzine again to 10 mg and try to stop it. Stop hydroxyzine 25 mg at night Start hydroxyzine 10 mg tablets 1 at night Start Dayvigo 5 mg tablet 1 at night using samples.  If it helps then stop the hydroxyzine to help your fatigue and memory Spending way too much time in bed.  03/31/2021 phone call: Oliva Bustard is not covered until Belsomra is tried.  Patient was to pick up samples of Belsomra. 04/02/2021 phone call: Complaining of too sleepy on trazodone 50 mg nightly.  Suggested they try half of tablet and if is still a problem stop it. 04/29/2021 phone call with questions and concerns about the [name].  Wife saying that he had problems with rigidity in his arms and hands. MD response: The  patient and his wife call fairly often with complaints that very between excessive tongue movements associated with tardive dyskinesia and on the alternative side the sort of complaints that could be related to parkinsonian side effects of [name].  So if he takes too little [name] his tongue movements will get worse but if he takes too much he may shuffle more than usual.  Even at his baseline before he took [name] he tended to shuffle his feet and have a reduced arm swing.  He is currently on 12 mg [name] 2 tablets twice daily.  If we reduce it we should reduce it only slightly to 12+9 mg tablets 1 of each twice daily.  My advice is that we not make the any med change until his next appointment.  At his last appointment he stated the tongue movements had only recently gotten better and if we reduce the dose they may get worse again.  06/25/2021 appointment with the following noted: He reduced [name] on his own to 1 and 1/4 of 9 mg tablets about a week ago bc sleepiness, rigidity of arms and legs and fatigue partly better.  Tongue movements have come back a little. No sleep meds needed.  Tired and lays in bed a lot. Minimal anxiety and depression. Has stopped hydroxyzine. Started OTC TremorSmoothe prn. Wants to come off [name] bc sx noted.   Always tended to sleep a lot.  Occ recurring disturbing dream. Plan: To address fatigue, sleepiness and cognition: After another week of [name] 9 mg tablets 1 and 1/4 tablets twice daily he can reduce it to 1 tablet twice daily but risk return of TD coming off of this as he wants to do. TD is managed.  09/25/2021 appointment with the following noted: He reduced [name] from 18 mg BID to 13.5 mg BID and feels it's better with less general tension in his body.  Lately energy better but not right after reducing the med. No worsening of tongue movements which come and go. Depression wife says the weakness he feels is psychological bc likes to sleep a lot.   Not sure how long it's been going on.  To bed 7:30 and gets up about 830.  Walks and then back to bed until about 1130.  Sleeps some.  Chronic tiredness.   Still takes Zoloft 50.   Nervousness is there in his body but not worrying about anything.   pLan no med changes  10/06/2021 phone call asking to increase [name] to 12 mg twice daily.  That dosage was changed as requested.  10/15/2021 phone call from patient's wife Bonita Quin stating bupropion ER 100 mg every morning made him too hyper and nervous and he stopped it.  10/21/2021 phone call from wife stating with the change in [name] dose the wife thinks his TD symptoms are worse.  On 12 mg twice daily versus 9 mg tablets 1-1/2 twice daily.  So the 9 mg dose was sent back in.  12/03/2021 appointment with the following noted: Taking [name] 9 mg TID, sertraline 50, no Wellbutrin. Fair.  Tiredness not as pronounced as it was.  Not sure why. Not sure about depression, I have something.  Can't describe it but not worrying a lot.  Has had a number of stressors.  Nerves on edge at times.  Trouble resting but still sleeps a lot.  It comes and goes.  Sleeps 10 hours. Not that productive and doesn't do much.  Enjoyed son and his family for Christmas. TD better not gone.  Worse under stress.   Not going to church DT fear of Covid and history of heart operation. Plan: Increase  sertraline to 75 mg bc of "nerves on edge".    02/09/22 appt noted: Increase apptetite and  wt from 145 to 159#.  In the last couple of months. Less anxiety with nerves less on edge. [name] workig fair but tongue still moves. Depression better not gone. Energy better but fair. Sleep with benadryl 4 nightly. Plan: continue sertraline 75 and [name] 9 mg TID-QID  TC 03/06/22  4:09 PM Note If his anxiety persists into next week he can increase sertraline to 100 mg daily for anxiety.     06/08/22 appt noted: Continues sertraline 100 mg working better, [name] 9 mg 4  daily Tolerating meds.  Less SE  now than in the past but slow gait with short steps at times. Thinks tongue movements better with more [name]. Anxiety better controlled.  Depression not too bad. Went to church first time Sunday and that helped.  Had avoided bc Covid fears. Plans to attend person on Sunday mornings. Otherwise health is OK. Son helpful but fixated on rapture. Plan: Continue [name] 9 mg tablets 1 QID and sertraline 100   10/13/22 appt noted: Continues sertraline 100 mg working better, [name] 9 mg 4 daily Fair  with walking as the only real problem.  Sometimes short steps and not much endurance.  Has had PT in the past. Tongue movmements generally controlled.  When cut back to [name] TID he complained of more tongue movements. He also tried stopping it all together also. 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  He has not really tried TID recommend trial to see if gait is better.    02/10/23 appt noted: Dropped [name] to TID and worsening TD including jaw and tongue movementso increased back to QID.  Took a while to notice any difference, but better now. Overall now doing better but "ill at ease at times" and will call for Bonita Quin almost unconsciously with some anxiety. It comes and goes daily.  There most of the time and can occur without triggers.   Not much depression.  Denies sign avoidance.   Sleep is better lately.  Taking melatonin but sleep is irregular.  Not drowsy in the day.  Some napping.  03/02/23 TC pt complaining of vague feeling discomfort since here. MD resp:  This sounds the same as his complaints at the visit 2/28 "Overall now doing better but "ill at ease at times" and will call for Bonita Quin almost unconsciously with some anxiety. It comes and goes daily. There most of the time and can occur without triggers. "   We raised the dose to 150 mg sertraline  at that time.  Give it 2 more weeks bc most of the benefit will probably happen in the next 2 weeks.  If he is not improving by then we will reduce sertraline to 1 daily.    CMA TC with wife: Called wife to let her know recommendations and she had not increased dose from 100 to 150 mg. She thought one tablet was 150 mg.  I told her to cut pill in half and give 1 whole pill and 1 half pill. Asked that she let us know if 2 weeks how he was doing, sooner if needed.       05/12/23 appt noted:  seen in office Meds : incr sertraline to 150 mg mid March Better since of well being since increase sertaline.   No SE noted. More generally anxious without reason when goes to bed.  Sleep 10 to 8.   Feels he needs current dose Austed 9 mg QID bc uncontrollable tongue movements with less.  They are manageable with this dose. No par.  No fear.  Satisfied with current meds.  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  Son Loraine Leriche living with them.  Become more committed [place].  He has schizophrenia.   Past Psychiatric Medication Trials: Mirtazapine,  olanzapine 5 EPS, perphenazine started TD with mouth px,  Saphris 5 mg and stopped due to EPS complaints and feeling "funny" Caplyta side effects risperidone 2 tremors  Buspirone,   amitriptyline 25,  sertraline 50 Wellbutrin SR 100 nervous Hydroxyzine 10-20 Clonazepam 0.5 TID dizzy and sleepy Ativan 0.5 mg QID Ingrezza and  [name] 18 mg BID  Review of Systems:  Review of Systems  Constitutional:  Negative for appetite change and fatigue.  Cardiovascular:  Negative for palpitations.  Gastrointestinal:  Negative for constipation.  Musculoskeletal:  Positive for gait problem.  Neurological:  Negative for dizziness, tremors and syncope.       No falls.  Slow gait.   Psychiatric/Behavioral:  Negative for agitation, behavioral problems, confusion, decreased concentration, dysphoric mood, hallucinations, self-injury and sleep disturbance. The  patient is nervous/anxious.  The patient is not hyperactive.        Sleepy  CABG 07/19/20  Medications: I have reviewed the patient's current medications.  Current Outpatient Medications  Medication Sig Dispense Refill   acetaminophen (TYLENOL) 500 MG tablet Take 500 mg by mouth daily.     atorvastatin (LIPITOR) 20 MG tablet Take 1 tablet (20 mg total) by mouth daily. 90 tablet 3   clopidogrel (PLAVIX) 75 MG tablet TAKE 1 TABLET BY MOUTH EVERY DAY 90 tablet 3   Deutetrabenazine ([name]) 9 MG TABS Take 1 tablet by mouth 4 (four) times daily. 120 tablet 5   metoprolol succinate (TOPROL XL) 25 MG 24 hr tablet Take 1 tablet (25 mg total) by mouth daily. 90 tablet 3   Multiple Vitamin (MULTIVITAMIN WITH MINERALS) TABS tablet Take 2 tablets by mouth daily. 50 +     sennosides-docusate sodium (SENOKOT-S) 8.6-50 MG tablet Take 1 tablet by mouth daily with supper.     sertraline (ZOLOFT) 100 MG tablet Take 1.5 tablets (150 mg total) by mouth daily. 135 tablet 0   silodosin (RAPAFLO) 4 MG CAPS capsule Take 4 mg by mouth daily with breakfast.     VITAMIN D PO Take 1 capsule by mouth daily.     No current facility-administered medications for this visit.    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   LIMA to LAD, Sequential SVG to OM1-OM3, SVG to RCA, EVH via  left thigh   Schizophrenia (HCC)    Severe aortic stenosis     Family History  Problem Relation Age of Onset   Hypertension Mother    Heart attack Father    Hypertension Sister    Hypertension Brother     Social History   Socioeconomic History   Marital status: Married    Spouse name: Not on file   Number of children: Not on file   Years of education: Not on file   Highest education level: Not on file  Occupational History   Not on file  Tobacco Use   Smoking status: Never   Smokeless tobacco: Never  Vaping Use   Vaping Use: Never used  Substance and Sexual Activity   Alcohol use: No   Drug use: Never   Sexual activity: Not on file  Other Topics Concern   Not on file  Social History Narrative   Not on file   Social Determinants of Health   Financial Resource Strain: Not on file  Food Insecurity: Not on file  Transportation Needs: Not on file  Physical Activity: Not on file  Stress: Not on file  Social Connections: Not on file  Intimate Partner Violence: Not on file    Past Medical History, Surgical history, Social history, and Family history were reviewed and updated  as appropriate.   Please see review of systems for further details on the patient's review from today.   Objective:   Physical Exam:  There were no vitals taken for this visit.  Physical Exam HENT:     Right Ear: Decreased hearing noted.  Neurological:     Mental Status: He is alert and oriented to person, place, and time.     Cranial Nerves: No dysarthria.     Gait: Gait abnormal.     Comments: Reduced arm swing and shortened gait Min incr motor tone UE bilat  Psychiatric:        Attention and Perception: Attention and perception normal.        Mood and Affect: Mood is anxious. Mood is not depressed.        Speech: Speech normal.        Behavior: Behavior is cooperative.        Thought Content: Thought content normal. Thought content is not paranoid or delusional. Thought content  does not include homicidal or suicidal ideation. Thought content does not include suicidal plan.        Cognition and Memory: Cognition and memory normal.        Judgment: Judgment normal.     Comments: Insight intact     Lab Review:     Component Value Date/Time   NA 132 (L) 07/24/2020 0400   NA 130 (L) 07/09/2020 1508   K 3.4 (L) 07/24/2020 0400   CL 96 (L) 07/24/2020 0400   CO2 24 07/24/2020 0400   GLUCOSE 115 (H) 07/24/2020 0400   BUN 20 07/24/2020 0400   BUN 16 07/09/2020 1508   CREATININE 0.74 07/24/2020 0400   CALCIUM 7.6 (L) 07/24/2020 0400   PROT 6.6 07/17/2020 0920   PROT 6.6 07/09/2020 1508   ALBUMIN 3.8 07/17/2020 0920   ALBUMIN 4.4 07/09/2020 1508   AST 18 07/17/2020 0920   ALT 22 07/17/2020 0920   ALKPHOS 54 07/17/2020 0920   BILITOT 0.7 07/17/2020 0920   BILITOT 0.4 07/09/2020 1508   GFRNONAA >60 07/24/2020 0400   GFRAA >60 07/24/2020 0400       Component Value Date/Time   WBC 14.0 (H) 07/23/2020 0645   RBC 3.78 (L) 07/23/2020 0645   HGB 11.9 (L) 07/23/2020 0645   HGB 13.5 06/18/2020 1056   HCT 35.1 (L) 07/23/2020 0645   HCT 38.6 06/18/2020 1056   PLT 136 (L) 07/23/2020 0645   PLT 243 06/18/2020 1056   MCV 92.9 07/23/2020 0645   MCV 90 06/18/2020 1056   MCH 31.5 07/23/2020 0645   MCHC 33.9 07/23/2020 0645   RDW 13.7 07/23/2020 0645   RDW 13.0 06/18/2020 1056   LYMPHSABS 1.7 06/18/2020 1056   EOSABS 0.4 06/18/2020 1056   BASOSABS 0.0 06/18/2020 1056   I will  No results found for: "POCLITH", "LITHIUM"   No results found for: "PHENYTOIN", "PHENOBARB", "VALPROATE", "CBMZ"   AIMS    Flowsheet Row Office Visit from 06/04/2020 in [facility]  AIMS Total Score 22        .res Assessment: Plan:    Kairee was seen today for follow-up, depression, anxiety and other.  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 We discussed the following.   We discussed Patient has history of severe recurrent major depression with psychotic features.  There are also elements of OCD.   His paranoid is improved.    Stopped Benadryl.  Taking melatonin.  Disc sleep hygiene.  His son has schizophrenia so there may be a genetic predisposition to psychosis.  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  Both son and pt have history of slowness of gait, reduced arm swing , short steps that predate any history of antipsychotic.  This is ongoing and ? Worse with [name].  He does not have concerned.  He has benefited with reduction in anxiety from the use of sertraline . Still has frequent anxiey daily abut is better Again his depression is better with the sertraline.  Less shame but that may be mild paranoia or anxiety and not depression. Better with increase sertraline to 150 mg bc anxiety     Disc SE in detail and SSRI withdrawal sx. Call if a problem  Discussed his tendency to change his medicines on his own.  There is significant risk in doing so.  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  He was educated that TD tends to risk turn and recur if you stop the [name].   Continue [name] 9 mg tablets 1 QID TD is manageable but not gone.   Meds help a lot and are tolerated.  FU 4 mos   Meredith Staggers, MD, DFAPA     No future appointments.     No orders of the defined types were placed in this encounter.       -------------------------------

## 2023-05-16 ENCOUNTER — Other Ambulatory Visit: Payer: Self-pay | Admitting: Psychiatry

## 2023-05-16 DIAGNOSIS — F411 Generalized anxiety disorder: Secondary | ICD-10-CM

## 2023-05-16 DIAGNOSIS — F331 Major depressive disorder, recurrent, moderate: Secondary | ICD-10-CM

## 2023-05-16 DIAGNOSIS — F061 Catatonic disorder due to known physiological condition: Secondary | ICD-10-CM

## 2023-05-26 ENCOUNTER — Telehealth: Payer: Self-pay | Admitting: Psychiatry

## 2023-05-26 NOTE — Telephone Encounter (Signed)
Contacted [employer] [phone] to get an approval for quantity per day of 3 total, Sertraline 50 mg tablets 3/daily, should fax response with determination/approval

## 2023-05-27 NOTE — Telephone Encounter (Signed)
PA approved for Sertralline 3 daily

## 2023-05-27 NOTE — Telephone Encounter (Signed)
Prior approval received for Quantity Limit effective 12/14/2022-12/14/2023

## 2023-06-18 ENCOUNTER — Other Ambulatory Visit: Payer: Self-pay | Admitting: Psychiatry

## 2023-06-18 DIAGNOSIS — F411 Generalized anxiety disorder: Secondary | ICD-10-CM

## 2023-06-18 DIAGNOSIS — F061 Catatonic disorder due to known physiological condition: Secondary | ICD-10-CM

## 2023-06-18 DIAGNOSIS — F331 Major depressive disorder, recurrent, moderate: Secondary | ICD-10-CM

## 2023-07-01 DIAGNOSIS — R031 Nonspecific low blood-pressure reading: Secondary | ICD-10-CM | POA: Diagnosis not present

## 2023-07-01 DIAGNOSIS — F3341 Major depressive disorder, recurrent, in partial remission: Secondary | ICD-10-CM | POA: Diagnosis not present

## 2023-07-01 DIAGNOSIS — F429 Obsessive-compulsive disorder, unspecified: Secondary | ICD-10-CM | POA: Diagnosis not present

## 2023-07-01 DIAGNOSIS — H9193 Unspecified hearing loss, bilateral: Secondary | ICD-10-CM | POA: Diagnosis not present

## 2023-07-01 DIAGNOSIS — Z0000 Encounter for general adult medical examination without abnormal findings: Secondary | ICD-10-CM | POA: Diagnosis not present

## 2023-07-01 DIAGNOSIS — R7303 Prediabetes: Secondary | ICD-10-CM | POA: Diagnosis not present

## 2023-07-01 DIAGNOSIS — G2401 Drug induced subacute dyskinesia: Secondary | ICD-10-CM | POA: Diagnosis not present

## 2023-07-01 DIAGNOSIS — E782 Mixed hyperlipidemia: Secondary | ICD-10-CM | POA: Diagnosis not present

## 2023-07-01 DIAGNOSIS — Z23 Encounter for immunization: Secondary | ICD-10-CM | POA: Diagnosis not present

## 2023-07-01 DIAGNOSIS — Z951 Presence of aortocoronary bypass graft: Secondary | ICD-10-CM | POA: Diagnosis not present

## 2023-07-01 DIAGNOSIS — I700 Atherosclerosis of aorta: Secondary | ICD-10-CM | POA: Diagnosis not present

## 2023-07-01 LAB — LAB REPORT - SCANNED
A1c: 5.6
EGFR: 87

## 2023-08-02 DIAGNOSIS — D72829 Elevated white blood cell count, unspecified: Secondary | ICD-10-CM | POA: Diagnosis not present

## 2023-09-10 ENCOUNTER — Other Ambulatory Visit: Payer: Self-pay | Admitting: Internal Medicine

## 2023-09-15 ENCOUNTER — Encounter: Payer: Self-pay | Admitting: Psychiatry

## 2023-09-15 ENCOUNTER — Ambulatory Visit: Payer: Medicare HMO | Admitting: Psychiatry

## 2023-09-15 DIAGNOSIS — G2401 Drug induced subacute dyskinesia: Secondary | ICD-10-CM

## 2023-09-15 DIAGNOSIS — F411 Generalized anxiety disorder: Secondary | ICD-10-CM | POA: Diagnosis not present

## 2023-09-15 DIAGNOSIS — F5105 Insomnia due to other mental disorder: Secondary | ICD-10-CM

## 2023-09-15 DIAGNOSIS — W19XXXA Unspecified fall, initial encounter: Secondary | ICD-10-CM | POA: Diagnosis not present

## 2023-09-15 DIAGNOSIS — F061 Catatonic disorder due to known physiological condition: Secondary | ICD-10-CM | POA: Diagnosis not present

## 2023-09-15 DIAGNOSIS — F251 Schizoaffective disorder, depressive type: Secondary | ICD-10-CM

## 2023-09-15 DIAGNOSIS — R5383 Other fatigue: Secondary | ICD-10-CM

## 2023-09-15 DIAGNOSIS — S2231XA Fracture of one rib, right side, initial encounter for closed fracture: Secondary | ICD-10-CM | POA: Diagnosis not present

## 2023-09-15 MED ORDER — AUSTEDO XR 30 MG PO TB24: 1.0000 | ORAL_TABLET | Freq: Every morning | ORAL | 1 refills | Status: DC

## 2023-09-15 MED ORDER — SERTRALINE HCL 50 MG PO TABS
150.0000 mg | ORAL_TABLET | Freq: Every day | ORAL | 1 refills | Status: DC
Start: 2023-09-15 — End: 2024-01-24

## 2023-09-15 NOTE — Progress Notes (Signed)
Neil [name] [id number] 20-Jul-1941 82 y.o.     Subjective:   Patient ID:  Neil [name] is a 82 y.o. (DOB 1941/02/04) male.  Chief Complaint:  Chief Complaint  Patient presents with   Follow-up   Depression   Anxiety    Medication Refill Pertinent negatives include no fatigue.  Anxiety Symptoms include nervous/anxious behavior. Patient reports no confusion, decreased concentration, dizziness or palpitations.    Depression        Associated symptoms include no decreased concentration, no fatigue and no appetite change.  Past medical history includes anxiety.     Neil [name] presents today for follow-up of psychosis, depression, and anxiety.  He has required an urgent appointment because of wanting further med changes and frequent phone calls with med changes since he was last here  When seen September 3 he was switched off olanzapine because of EPS and switched on to Saphris which was raised to 5 mg nightly but only took that dose 1 time.  The rest of the time he took only 2.5 mg nightly.   He called back 5 days later stating he wanted to stop it because it was causing him to hold his mouth open and he was having a worsening time with tongue movements and felt "funny".  He was encouraged to give it more time but he would not agree to do so.  He stopped it Friday 9/11 and still felt the funny tense feeling.    When  seen September 01, 2019.  The decision was made to switch from [place] to [place] because of difficulty getting an adequate response and limited side effects from [place].  We also discussed the antipsychotic and the plan was to start Caplyta but those samples were not available so he decided to restart Saphris 2.5 mg nightly.  At the visit September 15, 2019 [name] was increased to 12 mg twice daily, Saphris was discontinued and Caplyta 42 mg daily was started because of his low EPS risk.  He still needed an antipsychotic.  He continued on sertraline 75 mg daily  because he felt it helped his depression.  Wife called 10/8 and said the following: On Caplyta,  Thinks it is too strong.  His movements are slow, sleeping too much, drowsy during day ( doesn't really wake up until 4pm after sleeping all night). Takes him along time to think and respond. Had trouble with mouth and jaw before, and now he is holding his mouth open all the time.  Paranoia is better though. Was told to take it 3 days weekly.    October 16 [name] increased to 18 mg BID  Called again November 9 stating he couldn't tolerate either med.  He stopped the Caplyta and reduced the [name] to 9 mg BID a few days ago.  CO rigidity in lower back and arms.  Still bothered by constant tongue movements.  No change in worry, mood but paranoia is reduced since Caplyta.   visit October 25, 2019.  He had stopped Caplyta.  He had also reduced [name] to 9 mg twice daily on his own.  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  He is very impatient with med changes.  Therefore we did not start a new antipsychotic at the last visit.  He tends to make frequent phone  calls and did call again on November 23 complaining of feeling very restless and difficulty sleeping.  Was prescribed clonazepam 0.5 mg twice daily.  He called back the next day stating he wanted to stop the [name] due to restlessness but he had never taken the prescribed clonazepam.  Using courage to follow the treatment plan and take the clonazepam.  He called again the next day stating he still wanted to stop [name] and the clonazepam was making his gait unsteady.  He was informed that if he stopped the [name] his mouth movements would get worse which is been a leading complaint but he could stop it if he wanted to.  December 4 he reports clonazepam has helped  the anxiety and less guilt and less rigidity.  Taking TID and only SE is a little slower movements.  1 fall, slipped on Ba floor where water was lying.  No injury.   Gained 3# and better appetite for the first time in a long time.  CO tongue movements.  Restlessness is now bearable.  Less anxiety overall.  No AH.    visit December 4.  His psychosis was not severe and the decision was made not to start another antipsychotic but instead to try to optimize treatment for tardive dyskinesia and [name] was increased to 12 mg twice daily. He called back immediately after the head visit admitting he had given false information during the visit and that he not actually been taking the [name] in the way he told me during the visit making it necessary to change the [name] to 9 mg twice daily.  He was confronted about his noncompliance and warned that he may be discharged from the practice if he continued to be misleading and noncompliant with the physician.  This is not only compromising care but it is increasing his own risk because he is giving false information.   visit December 21, 2019.  Because of his complaints about balance issues clonazepam was reduced and spread out to 0.25 mg 4 times daily.  Also for tardive dyskinesia symptoms [name] was increased to 12 mg twice daily.  Prior to that visit he admitted being generally noncompliant with the [name] making it difficult to judge what dose to prescribe.  He was continued on sertraline 75 mg daily as he felt that was helpful for his anxiety. Says he's compliant with med changes last visit.  Tongue movements are better but not gone. No problems with the [name].  Stiffness comes and goes and located in head and eyes.  Denies it's vertigo.  Intensity is less.  If shuts his eyes it goes right away.  visit January 04, 2020.  [name] remained 12 mg twice daily and sertraline 75 mg daily.  The following was changed: switch to lorazepam bc tiredness and balance  problems to Ativan. Yes DC clonazepam and start lorazepam 0.5 mg 4 times daily  seen February 8 the patient reports the following.  Yes it did help.  Not as slowed and balance is good now.  Still feels tired with tendency to want to lay down and knows he needs to work on this.  Anxiety is pretty fair.  We decided to reduce the lorazepam to 0.5 mg 3 times daily in hopes of improving energy and balance further.  No other meds were changed.  He is still not on an antipsychotic.  Anxiety comes and goes but sometimes able to get by with TID lorazepam and energy is better.  Wife says sometimes  gets uptight and can't relax so needs QID.Pt reports that mood is anxiety aand not much depression and describes anxiety as worry. Anxiety symptoms include: worry not panic and moderate.   Sleep is better usually with hydroxyzine and sometimes lorazepam. Pt reports that appetite is Much better with weight up to 131# as low as 120#.  Normal was 140#.Marland Kitchen Pt reports that energy is better and OK. Concentration is fair.. Suicidal thoughts:  None. Denies sig paranoid thought.  Plan: No med change except okay to use hydroxyzine 25 mg nightly for insomnia.  Per usual the patient tends to make multiple phone calls between appointments.  He is called about 7 times since March.  06/04/2020 appointment with the following noted: Seen with his wife. Reports hydroxyzine 25 mg is not helpful for sleep. Still moving tongue. No SE meds currently. Not depressed.  Is anxious persistent in the back ground and not even connected to thoughts. Little intrusive thougts.   Sleep concerns, to bed 830 an not sure when goes to sleep.  Sometimes can't go to sleep. Get OOB about 9 AM.  Usually goes  back to bed.  CO weakness for 18 mos. Needs Aortic valve replacement. He tried reducing lorazepam to 0.5 mg TID and had a lot of restlessness but some improvement in energy.  Wonders if fatigue related to meds or heart valve problem.  Probably will have  heart surgery. Plan: Try to reduce lorazepam to 3 and 1/2 tablets daily.  to see if energy is better.  Keep the lowest effective dose possible.   Do not exceed 4 Ativan a day. No new antipsychotics started today. Increase [name] for 1 week to 1 of 6 mg and 1 of the 9 mg tablets twice daily for 1 week, Then increase again to 12 mg tablets 1 and 1/2 tablets twice daily or 18 mg twice daily. Continue sertraline 75 mg daily for anxiety.  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  Recovering slowly.  Just started walking a week or 2 ago. [place] Picking handles the meds. [name] doesn't control tongue movements but it helps. Done fine without lorazepam and less drowsy per wife. Taking hydroxyzine 25 mg for sleep with Tylenol.  Sleeping well per wife.   Wife thinks overall he's doing well from mental health perspective and seems calmer.  Pt says the psychosis has lightened up and he's grateful for that.  No depression.   Wants to try to stop hydroxyzine. Wants to try to reduce sertraline to 50 mg to prevent cutting.   Plan: He wants to reduce sertraline to 50 daily.  He says he's less depressed and it has helped his anxiety. Disc the risk of increased depression and anxiety but since 3 mos out from surgery OK. DC hydroxyzine if possible.  11/05/2020 patient called for refill of hydroxyzine and was taking 12.5 mg.  Dosage reduced to 10 mg.  02/14/2021 phone call from his wife Bonita Quin concerned about his tongue movements and his anxiety. MD response: The patient and his wife are elderly.  He seems to forget the purpose of the medication and have some difficulty comprehending the conditions for which he is treated as well as the purpose of the meds and side effect issues. He is also somewhat impatient and anxious wanting changes to be made quickly. The answer to the question is the [name] is to treat the symptoms of his tongue movements.  If he stops the [name] the tongue  movements will be  worse.  The [name] is not causing the tongue movements.  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 He has an appointment with me in 3 weeks.  I do not want to make any med changes over the phone or even med adjustments.  I would urge him to come into the office at that visit.  I do not do not want a televisit.  I want to be able to see him so that I can observe his tongue movements.  03/26/2021 appointment with following noted: Taking [name] 18 mg BID. Claims compliance. Started to feel more fatigued and went to See Dr. Tally Joe 02/25/21 with normal labs including: Comprehensive metabolic panel normal except sodium 132, normal CBC, TSH 2.24, B12 538 normal Fatigue is some better 20%.  Wants to get back in bed after breakfast.  For unclear reasons hydroxyzine raised again to 25 mg nightly. Some initial insomnia and some awakening.  Nap 1 1/2 hour in AM and another hour in afternoon. Goes to bed 730PM and up 8 AM. Tongue movements persisted until 2 weeks ago and has gotten better. Reduced sertraline to 50 mg and anxiety is no worse. Not depressed. Reads about sE [name] and wonders if it is causing fatigue.  Plan: To address fatigue and cognition: Reduce hydroxyzine again to 10 mg and try to stop it. Stop hydroxyzine 25 mg at night Start hydroxyzine 10 mg tablets 1 at night Start Dayvigo 5 mg tablet 1 at night using samples.  If it helps then stop the hydroxyzine to help your fatigue and memory Spending way too much time in bed.  03/31/2021 phone call: Oliva Bustard is not covered until Belsomra is tried.  Patient was to pick up samples of Belsomra. 04/02/2021 phone call: Complaining of too sleepy on trazodone 50 mg nightly.  Suggested they try half of tablet and if is still a problem stop it. 04/29/2021 phone call with questions and concerns about the [name].  Wife saying that he had problems with rigidity in his arms and hands. MD response: The patient and his wife  call fairly often with complaints that very between excessive tongue movements associated with tardive dyskinesia and on the alternative side the sort of complaints that could be related to parkinsonian side effects of [name].  So if he takes too little [name] his tongue movements will get worse but if he takes too much he may shuffle more than usual.  Even at his baseline before he took [name] he tended to shuffle his feet and have a reduced arm swing.  He is currently on 12 mg [name] 2 tablets twice daily.  If we reduce it we should reduce it only slightly to 12+9 mg tablets 1 of each twice daily.  My advice is that we not make the any med change until his next appointment.  At his last appointment he stated the tongue movements had only recently gotten better and if we reduce the dose they may get worse again.  06/25/2021 appointment with the following noted: He reduced [name] on his own to 1 and 1/4 of 9 mg tablets about a week ago bc sleepiness, rigidity of arms and legs and fatigue partly better.  Tongue movements have come back a little. No sleep meds needed.  Tired and lays in bed a lot. Minimal anxiety and depression. Has stopped hydroxyzine. Started OTC TremorSmoothe prn. Wants to come off [name] bc sx noted.   Always tended to sleep a lot. Occ recurring disturbing dream. Plan: To  address fatigue, sleepiness and cognition: After another week of [name] 9 mg tablets 1 and 1/4 tablets twice daily he can reduce it to 1 tablet twice daily but risk return of TD coming off of this as he wants to do. TD is managed.  09/25/2021 appointment with the following noted: He reduced [name] from 18 mg BID to 13.5 mg BID and feels it's better with less general tension in his body.  Lately energy better but not right after reducing the med. No worsening of tongue movements which come and go. Depression wife says the weakness he feels is psychological bc likes to sleep a lot.  Not sure how long it's  been going on.  To bed 7:30 and gets up about 830.  Walks and then back to bed until about 1130.  Sleeps some.  Chronic tiredness.   Still takes Zoloft 50.   Nervousness is there in his body but not worrying about anything.   pLan no med changes  10/06/2021 phone call asking to increase [name] to 12 mg twice daily.  That dosage was changed as requested.  10/15/2021 phone call from patient's wife Bonita Quin stating bupropion ER 100 mg every morning made him too hyper and nervous and he stopped it.  10/21/2021 phone call from wife stating with the change in [name] dose the wife thinks his TD symptoms are worse.  On 12 mg twice daily versus 9 mg tablets 1-1/2 twice daily.  So the 9 mg dose was sent back in.  12/03/2021 appointment with the following noted: Taking [name] 9 mg TID, sertraline 50, no Wellbutrin. Fair.  Tiredness not as pronounced as it was.  Not sure why. Not sure about depression, I have something.  Can't describe it but not worrying a lot.  Has had a number of stressors.  Nerves on edge at times.  Trouble resting but still sleeps a lot.  It comes and goes.  Sleeps 10 hours. Not that productive and doesn't do much.  Enjoyed son and his family for Christmas. TD better not gone.  Worse under stress.   Not going to church DT fear of Covid and history of heart operation. Plan: Increase  sertraline to 75 mg bc of "nerves on edge".    02/09/22 appt noted: Increase apptetite and  wt from 145 to 159#.  In the last couple of months. Less anxiety with nerves less on edge. [name] workig fair but tongue still moves. Depression better not gone. Energy better but fair. Sleep with benadryl 4 nightly. Plan: continue sertraline 75 and [name] 9 mg TID-QID  TC 03/06/22  4:09 PM Note If his anxiety persists into next week he can increase sertraline to 100 mg daily for anxiety.     06/08/22 appt noted: Continues sertraline 100 mg working better, [name] 9 mg 4 daily Tolerating meds.  Less SE   now than in the past but slow gait with short steps at times. Thinks tongue movements better with more [name]. Anxiety better controlled.  Depression not too bad. Went to church first time Sunday and that helped.  Had avoided bc Covid fears. Plans to attend person on Sunday mornings. Otherwise health is OK. Son helpful but fixated on rapture. Plan: Continue [name] 9 mg tablets 1 QID and sertraline 100   10/13/22 appt noted: Continues sertraline 100 mg working better, [name] 9 mg 4 daily Fair  with walking as the only real problem.  Sometimes short steps and not much endurance.  Has had PT in the  past. Tongue movmements generally controlled.  When cut back to [name] TID he complained of more tongue movements. He also tried stopping it all together also. 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  He has not really tried TID recommend trial to see if gait is better.    02/10/23 appt noted: Dropped [name] to TID and worsening TD including jaw and tongue movementso increased back to QID.  Took a while to notice any difference, but better now. Overall now doing better but "ill at ease at times" and will call for Bonita Quin almost unconsciously with some anxiety. It comes and goes daily.  There most of the time and can occur without triggers.   Not much depression.  Denies sign avoidance.   Sleep is better lately.  Taking melatonin but sleep is irregular.  Not drowsy in the day.  Some napping.  03/02/23 TC pt complaining of vague feeling discomfort since here. MD resp:  This sounds the same as his complaints at the visit 2/28 "Overall now doing better but "ill at ease at times" and will call for Bonita Quin almost unconsciously with some anxiety. It comes and goes daily. There most of the time and can occur without triggers. "   We raised the dose to 150 mg sertraline at that time.  Give it 2 more weeks  bc most of the benefit will probably happen in the next 2 weeks.  If he is not improving by then we will reduce sertraline to 1 daily.    CMA TC with wife: Called wife to let her know recommendations and she had not increased dose from 100 to 150 mg. She thought one tablet was 150 mg.  I told her to cut pill in half and give 1 whole pill and 1 half pill. Asked that she let us know if 2 weeks how he was doing, sooner if needed.       05/12/23 appt noted:  seen in office Meds : incr sertraline to 150 mg mid March Better since of well being since increase sertaline.   No SE noted. More generally anxious without reason when goes to bed.  Sleep 10 to 8.   Feels he needs current dose Austed 9 mg QID bc uncontrollable tongue movements with less.  They are manageable with this dose. No par.  No fear.  Satisfied with current meds.  09/15/23 appt noted: Meds: sertraline 150, [name] 9 QID SE slow gait Been fair overall.  Anxiety is a little better.  Not dep. Not much trouble with tongue movments anymore.   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  Son Loraine Leriche living with them.  Become more committed [place].  He has schizophrenia.   Past Psychiatric Medication Trials: Mirtazapine,  olanzapine 5 EPS, perphenazine started TD with mouth px,  Saphris 5 mg and stopped due to EPS complaints and feeling "funny" Caplyta side effects risperidone 2 tremors  Buspirone,   amitriptyline 25,  sertraline 50 Wellbutrin SR 100 nervous Hydroxyzine 10-20 Clonazepam 0.5 TID dizzy and sleepy Ativan 0.5 mg QID Ingrezza and  [name] 18 mg BID  Review of Systems:  Review of Systems  Constitutional:  Negative for appetite change and fatigue.  Cardiovascular:  Negative for palpitations.  Gastrointestinal:  Negative for constipation.  Musculoskeletal:  Positive for gait problem.  Neurological:  Negative for dizziness, tremors and syncope.       No  falls.  Slow gait.   Psychiatric/Behavioral:  Negative  for agitation, behavioral problems, confusion, decreased concentration, dysphoric mood, hallucinations, self-injury and sleep disturbance. The patient is nervous/anxious. The patient is not hyperactive.        Sleepy  CABG 07/19/20  Medications: I have reviewed the patient's current medications.  Current Outpatient Medications  Medication Sig Dispense Refill   acetaminophen (TYLENOL) 500 MG tablet Take 500 mg by mouth daily.     atorvastatin (LIPITOR) 20 MG tablet TAKE 1 TABLET BY MOUTH EVERY DAY 90 tablet 0   clopidogrel (PLAVIX) 75 MG tablet TAKE 1 TABLET BY MOUTH EVERY DAY 90 tablet 3   Deutetrabenazine ER ([name] XR) 30 MG TB24 Take 1 tablet by mouth every morning. 30 tablet 1   metoprolol succinate (TOPROL XL) 25 MG 24 hr tablet Take 1 tablet (25 mg total) by mouth daily. 90 tablet 3   Multiple Vitamin (MULTIVITAMIN WITH MINERALS) TABS tablet Take 2 tablets by mouth daily. 50 +     sennosides-docusate sodium (SENOKOT-S) 8.6-50 MG tablet Take 1 tablet by mouth daily with supper.     silodosin (RAPAFLO) 4 MG CAPS capsule Take 4 mg by mouth daily with breakfast.     VITAMIN D PO Take 1 capsule by mouth daily.     sertraline (ZOLOFT) 50 MG tablet Take 3 tablets (150 mg total) by mouth daily. 270 tablet 1   No current facility-administered medications for this visit.    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   21 mm Edwards Inspiris Resilia  stented bovine pericardial tissue valve   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   LIMA to LAD, Sequential SVG to OM1-OM3, SVG to RCA, EVH via left thigh   Schizophrenia (HCC)    Severe aortic stenosis     Family History  Problem Relation Age of Onset   Hypertension Mother    Heart attack Father    Hypertension Sister    Hypertension Brother     Social History   Socioeconomic History   Marital status: Married    Spouse name: Not on file   Number of children: Not on file   Years of education: Not on file   Highest education level: Not on file  Occupational History   Not on file  Tobacco Use   Smoking status: Never   Smokeless tobacco: Never  Vaping Use   Vaping status: Never Used  Substance and Sexual Activity   Alcohol use: No   Drug use: Never   Sexual activity: Not on file  Other Topics Concern   Not on file  Social History Narrative   Not on file   Social Determinants of Health   Financial Resource Strain: Not on file  Food Insecurity: Not on file  Transportation Needs: Not on file  Physical Activity: Not on file  Stress: Not on file  Social Connections: Not  on file  Intimate Partner Violence: Not on file    Past Medical History, Surgical history, Social history, and Family history were reviewed and updated as appropriate.   Please see review of systems for further details on the patient's review from today.   Objective:   Physical Exam:  There were no vitals taken for this visit.  Physical Exam HENT:     Right Ear: Decreased hearing noted.  Neurological:     Mental Status: He is alert and oriented to person, place, and time.     Cranial Nerves: No dysarthria.     Gait: Gait abnormal.     Comments: Reduced arm swing and shortened gait maybe worse Mild tongue rolling is better but not gone Min incr motor tone UE bilat  Psychiatric:        Attention and Perception: Attention and perception normal.        Mood and Affect: Mood is anxious. Mood is not depressed.         Speech: Speech normal.        Behavior: Behavior is cooperative.        Thought Content: Thought content normal. Thought content is not paranoid or delusional. Thought content does not include homicidal or suicidal ideation. Thought content does not include suicidal plan.        Cognition and Memory: Cognition and memory normal.        Judgment: Judgment normal.     Comments: Insight intact     Lab Review:     Component Value Date/Time   NA 132 (L) 07/24/2020 0400   NA 130 (L) 07/09/2020 1508   K 3.4 (L) 07/24/2020 0400   CL 96 (L) 07/24/2020 0400   CO2 24 07/24/2020 0400   GLUCOSE 115 (H) 07/24/2020 0400   BUN 20 07/24/2020 0400   BUN 16 07/09/2020 1508   CREATININE 0.74 07/24/2020 0400   CALCIUM 7.6 (L) 07/24/2020 0400   PROT 6.6 07/17/2020 0920   PROT 6.6 07/09/2020 1508   ALBUMIN 3.8 07/17/2020 0920   ALBUMIN 4.4 07/09/2020 1508   AST 18 07/17/2020 0920   ALT 22 07/17/2020 0920   ALKPHOS 54 07/17/2020 0920   BILITOT 0.7 07/17/2020 0920   BILITOT 0.4 07/09/2020 1508   GFRNONAA >60 07/24/2020 0400   GFRAA >60 07/24/2020 0400       Component Value Date/Time   WBC 14.0 (H) 07/23/2020 0645   RBC 3.78 (L) 07/23/2020 0645   HGB 11.9 (L) 07/23/2020 0645   HGB 13.5 06/18/2020 1056   HCT 35.1 (L) 07/23/2020 0645   HCT 38.6 06/18/2020 1056   PLT 136 (L) 07/23/2020 0645   PLT 243 06/18/2020 1056   MCV 92.9 07/23/2020 0645   MCV 90 06/18/2020 1056   MCH 31.5 07/23/2020 0645   MCHC 33.9 07/23/2020 0645   RDW 13.7 07/23/2020 0645   RDW 13.0 06/18/2020 1056   LYMPHSABS 1.7 06/18/2020 1056   EOSABS 0.4 06/18/2020 1056   BASOSABS 0.0 06/18/2020 1056   I will  No results found for: "POCLITH", "LITHIUM"   No results found for: "PHENYTOIN", "PHENOBARB", "VALPROATE", "CBMZ"   AIMS    Flowsheet Row Office Visit from 06/04/2020 in [facility]  AIMS Total Score 22        .res Assessment: Plan:    Mehmet was seen today for follow-up,  depression and anxiety.  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  Generalized anxiety disorder -     sertraline (ZOLOFT) 50 MG tablet; Take 3 tablets (150 mg total) by mouth daily.  Tardive dyskinesia  Fatigue, unspecified type  Insomnia due to mental condition  Other orders -     Deutetrabenazine ER ([name] XR) 30 MG TB24; Take 1 tablet by mouth every morning.     30 min non-face to face time with patient was spent on counseling and coordination of care. We discussed the following.   We discussed Patient has history of severe recurrent major depression with psychotic features.  There are also elements of OCD.   His paranoid is improved.    Stopped Benadryl.  Taking melatonin.  Disc sleep hygiene.  His son has schizophrenia so there may be a genetic predisposition to psychosis.  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  Both son and pt have history of slowness of gait, reduced arm swing , short steps that predate any history of antipsychotic.  This is ongoing and ? Worse with [name].  He does not have concerned.  He has benefited with reduction in anxiety from the use of sertraline . Still has frequent anxiey daily abut is better Again his depression is better with the sertraline.  Less shame but that may be mild paranoia or anxiety and not depression. Better with increase sertraline to 150 mg bc anxiety     Disc SE in detail and SSRI withdrawal sx. Call if a problem  Discussed his tendency to change his medicines on his own.  There is significant risk in doing so.    reduce [name] by switching to XR 30 mg  to see if gait is freer. TD is  manageable but not gone.   Meds help a lot and are tolerated.  FU 2 mos   Meredith Staggers, MD, DFAPA     Future Appointments  Date Time Provider Department Center  10/22/2023  1:30 PM Jodelle Gross, NP [facility] None       No orders of the defined types were placed in this encounter.      -------------------------------

## 2023-09-23 ENCOUNTER — Other Ambulatory Visit: Payer: Self-pay | Admitting: Psychiatry

## 2023-09-23 DIAGNOSIS — F061 Catatonic disorder due to known physiological condition: Secondary | ICD-10-CM

## 2023-09-23 DIAGNOSIS — F331 Major depressive disorder, recurrent, moderate: Secondary | ICD-10-CM

## 2023-09-23 DIAGNOSIS — F411 Generalized anxiety disorder: Secondary | ICD-10-CM

## 2023-10-05 ENCOUNTER — Telehealth: Payer: Self-pay | Admitting: Psychiatry

## 2023-10-05 NOTE — Telephone Encounter (Signed)
His wife is not explaining this very clearly.  When at his last appointment he was taking [name] 9 mg tablets, 1 tablet 4 times a day.  They should not have reduced to to one of the 9 mg tablet.  Please check to make sure exactly what he is taking.  I changed it to one of the [name] ER 30 mg tablets a day.  So he should have reduced the dose from 36 mg a day of the regular [name] to one of the [name] ER 30 mg daily.  That was not as big a change as it sounds like his wife is suggesting.  However please make sure that he received the [facility] ER 30 mg tablet and that is exactly what he is taking.  If it is what he is taking I do not want to change it until I see him in the office and can evaluate his tongue movements and is walking. The reason really we reduced the dose was because the patient was complaining that the [name] was making him was too stiff which is a possible side effect if the dose is too high.

## 2023-10-05 NOTE — Telephone Encounter (Signed)
Pt wife LVM@ 4:36 on 10/21.  She said when pt saw dr Jennelle Human on 10/2, they decided to cut down on the [name] from 4 pills a day to 1 because he was having trouble walking.  He fell off the porch on the way to coming to the appt with Dr Jennelle Human that day but he still wanted to come to his appt here.  After they left the appt, they went to [facility] walk-in and found out he broke a rib.  Pt's wife states that he is walking a little better.  But she said even though he's been taking 1 pill a day, he has tongue movement.  She wants to know if the pt should go back to taking the 4 pills a day.  Next appt 12/3

## 2023-10-05 NOTE — Telephone Encounter (Signed)
Please see message from patient's wife and advise.

## 2023-10-06 NOTE — Telephone Encounter (Signed)
Per conversation yesterday with Dr. Jennelle Human, patient should go back to previous dosing, but hold onto the 30 mg. Wife notified.

## 2023-10-11 ENCOUNTER — Other Ambulatory Visit: Payer: Self-pay | Admitting: Psychiatry

## 2023-10-11 NOTE — Telephone Encounter (Signed)
Pt refusing [name] XR.  Wants to stay on [name] 6 mg QiD.

## 2023-10-11 NOTE — Telephone Encounter (Signed)
RF INAPPROPRIATE; LF TODAY.

## 2023-10-19 NOTE — Progress Notes (Signed)
Cardiology Office Note:  .   Date:  10/22/2023  ID:  Neil [name], DOB 10-30-1941, MRN [id number] PCP: Tally Joe, MD  [facility] Providers Cardiologist:  Chrystie Nose, MD }   History of Present Illness: .   Neil [name] is a 82 y.o. 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 Last seen by Dr. Rennis Golden on 10/26/2022.   He comes today with his wife and his son sitting in a wheelchair.  He states the medication is on to help him with tardive dyskinesia is also significantly impairing his muscle strength.  He is not very active as a result of this.  He denies any discomfort in his chest, dyspnea on exertion, fatigue, or racing heart rate.  He is medically compliant.  His wife states that she notices that he sometimes feels dizzy when he stands or is unsteady on his feet.  He denies any presyncope or syncopal episodes.  ROS: As above otherwise negative.  Studies Reviewed: .   Echocardiogram 03/26/2023 1. Left ventricular ejection fraction, by estimation, is 60 to 65%. The  left ventricle has hyperdynamic function. The left ventricle demonstrates  regional wall motion abnormalities (see scoring diagram/findings for  description). There is mild concentric   left ventricular hypertrophy. Left ventricular diastolic parameters are  consistent with Grade I diastolic dysfunction (impaired relaxation).  Elevated left atrial pressure. There is moderate hypokinesis of the left  ventricular, basal inferior wall.   2. Right ventricular systolic function is normal. The right ventricular  size is normal. Tricuspid regurgitation signal is inadequate for assessing  PA  pressure.   3. Left atrial size was mildly dilated.   4. The mitral valve is myxomatous. Moderate mitral valve regurgitation.  No evidence of mitral stenosis. 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   5. The aortic valve has been repaired/replaced. Aortic valve  regurgitation is not visualized. No aortic stenosis is present. There is a  21 mm Edwards Inspiris Resilia bioprosthetic valve present in the aortic  position. Procedure Date: 2021. Aortic valve   mean gradient measures 9.0 mmHg. Aortic valve Vmax measures 2.17 m/s.   6. Aortic root/ascending aorta has been repaired/replaced.   7. The inferior vena cava is normal in size with greater than 50%  respiratory variability, suggesting right atrial pressure of 3 mmHg.   EKG Interpretation Date/Time:  Friday October 22 2023 13:39:32 EST Ventricular Rate:  53 PR Interval:  114 QRS Duration:  108 QT Interval:  456 QTC Calculation: 427 R Axis:   -23  Text Interpretation: Sinus bradycardia When compared with ECG of 25-Jul-2020 13:19, Sinus rhythm has replaced Junctional rhythm Vent. rate has decreased BY  42 BPM T wave inversion less evident in Inferior leads T wave amplitude has increased in Lateral leads Confirmed by Joni Reining [phone]) on 10/22/2023 1:52:32 PM     Physical Exam:   VS:  BP 100/60   Pulse (!) 53   Ht 5\' 5"  (1.651 m)   Wt 156 lb (70.8 kg)   SpO2 97%   BMI 25.96 kg/m    Wt Readings from Last 3 Encounters:  10/22/23 156 lb (70.8 kg)  10/26/22 161 lb (73 kg)  10/24/21 147 lb 12.8 oz (67 kg)    GEN: Well nourished, well developed in no acute distress sitting in a wheelchair.  Very pale. NECK: No JVD; No carotid bruits CARDIAC: RRR, bradycardic, 2/6 systolic murmur, crisp click in the aortic valve position on the left sternal border, no rubs, gallops RESPIRATORY:  Clear to auscultation without rales, wheezing or rhonchi  ABDOMEN: Soft,  non-tender, non-distended EXTREMITIES:  No edema; No deformity   ASSESSMENT AND PLAN: .    Coronary artery disease: History of CABG x 4, 2021.  He is without any anginal symptoms.  He is not very active.  He has significant muscular skeletal weakness as result of psychiatric medications and treat his tardive dyskinesia.  He remains on secondary prevention with blood pressure control, statin therapy, and low-cholesterol diet.  2.  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  His wife states that she feels like he might be lightheaded and dizzy at times.  It may be possible to decrease metoprolol to 12.5 mg daily to allow for better heart rate and blood pressure if necessary.  Right now the patient does not feel badly with a lower heart rate and blood pressure.  As stated above he is an active which may be contributing to hypotension.  3.  Severe aortic stenosis: Status post pericardial tissue valve during coronary artery bypass grafting.  He denies any issues concerning shortness of breath.  Will probably need repeat echocardiogram on next office visit to have ongoing surveillance of the aortic valve.  4.  Dyslipidemia: He remains on statin therapy with atorvastatin 20 mg daily.  Lipids and LFTs are drawn by primary care who is due to see next month.  Goal of LDL less than 70.         Signed, Bettey Mare. Liborio Nixon, ANP, AACC

## 2023-10-20 ENCOUNTER — Other Ambulatory Visit: Payer: Self-pay | Admitting: Internal Medicine

## 2023-10-22 ENCOUNTER — Other Ambulatory Visit: Payer: Self-pay | Admitting: Internal Medicine

## 2023-10-22 ENCOUNTER — Encounter: Payer: Self-pay | Admitting: Adult Health

## 2023-10-22 ENCOUNTER — Ambulatory Visit: Payer: Medicare HMO | Attending: Adult Health | Admitting: Adult Health

## 2023-10-22 VITALS — BP 100/60 | HR 53 | Ht 65.0 in | Wt 156.0 lb

## 2023-10-22 DIAGNOSIS — Z951 Presence of aortocoronary bypass graft: Secondary | ICD-10-CM | POA: Diagnosis not present

## 2023-10-22 DIAGNOSIS — E7800 Pure hypercholesterolemia, unspecified: Secondary | ICD-10-CM

## 2023-10-22 DIAGNOSIS — I2510 Atherosclerotic heart disease of native coronary artery without angina pectoris: Secondary | ICD-10-CM

## 2023-10-22 DIAGNOSIS — I10 Essential (primary) hypertension: Secondary | ICD-10-CM

## 2023-10-22 DIAGNOSIS — Z953 Presence of xenogenic heart valve: Secondary | ICD-10-CM

## 2023-10-22 NOTE — Patient Instructions (Signed)
Medication Instructions:  No Changes  *If you need a refill on your cardiac medications before your next appointment, please call your pharmacy*   Lab Work: No Labs If you have labs (blood work) drawn today and your tests are completely normal, you will receive your results only by: MyChart Message (if you have MyChart) OR A paper copy in the mail If you have any lab test that is abnormal or we need to change your treatment, we will call you to review the results.   Testing/Procedures: No Testing   Follow-Up: At [facility], you and your health needs are our priority.  As part of our continuing mission to provide you with exceptional heart care, we have created designated Provider Care Teams.  These Care Teams include your primary Cardiologist (physician) and Advanced Practice Providers (APPs -  Physician Assistants and Nurse Practitioners) who all work together to provide you with the care you need, when you need it.  We recommend signing up for the patient portal called "MyChart".  Sign up information is provided on this After Visit Summary.  MyChart is used to connect with patients for Virtual Visits (Telemedicine).  Patients are able to view lab/test results, encounter notes, upcoming appointments, etc.  Non-urgent messages can be sent to your provider as well.   To learn more about what you can do with MyChart, go to [email or web address].    Your next appointment:   1 year(s)  Provider:   Kenneth C Hilty, MD  

## 2023-10-25 DIAGNOSIS — R5381 Other malaise: Secondary | ICD-10-CM | POA: Diagnosis not present

## 2023-10-25 DIAGNOSIS — G2401 Drug induced subacute dyskinesia: Secondary | ICD-10-CM | POA: Diagnosis not present

## 2023-10-25 DIAGNOSIS — Z9181 History of falling: Secondary | ICD-10-CM | POA: Diagnosis not present

## 2023-10-25 DIAGNOSIS — R2689 Other abnormalities of gait and mobility: Secondary | ICD-10-CM | POA: Diagnosis not present

## 2023-10-26 ENCOUNTER — Other Ambulatory Visit: Payer: Self-pay | Admitting: Internal Medicine

## 2023-11-04 ENCOUNTER — Other Ambulatory Visit: Payer: Self-pay | Admitting: Psychiatry

## 2023-11-05 ENCOUNTER — Telehealth: Payer: Self-pay | Admitting: Internal Medicine

## 2023-11-05 NOTE — Telephone Encounter (Signed)
Patient's wife called to talk with Dr. Rennis Golden or nurse in regards to patient's statin medication. Please call back

## 2023-11-05 NOTE — Telephone Encounter (Signed)
Patient identification verified by 2 forms. Neil Rail, RN    Called and spoke to patients wife Neil [name] states:   -patient is taking atorvastatin 20mg  daily   -patient is feeling fatigue, unsure if related to medication   -previously patient was on rosuvastatin and was changed to current Rx atorvastatin due to developing fatigue as well   -would like to know if Patient can discontinue Rx to see if fatigue symptoms resolve  Informed Neil [name] message sent to Dr. Rennis Golden for input/advisement  Neil [name] verbalized understanding, no further questions at this time

## 2023-11-10 NOTE — Telephone Encounter (Signed)
  Ok to stop atorvastatin for 2 weeks - contact us if symptoms improve, otherwise, if not, then restart the atorvastatin.  Dr. Rennis Golden

## 2023-11-10 NOTE — Telephone Encounter (Signed)
Patient identification verified by 2 forms. Marilynn Rail, RN    Received call from patients wife Isaac Bliss provider message below  Lowry agrees with plan, no questions at this time

## 2023-11-10 NOTE — Telephone Encounter (Signed)
 Attempted to call patient, no answer left message requesting a call back.

## 2023-11-16 ENCOUNTER — Encounter: Payer: Self-pay | Admitting: Psychiatry

## 2023-11-16 ENCOUNTER — Ambulatory Visit: Payer: Medicare HMO | Admitting: Psychiatry

## 2023-11-16 DIAGNOSIS — G2401 Drug induced subacute dyskinesia: Secondary | ICD-10-CM | POA: Diagnosis not present

## 2023-11-16 DIAGNOSIS — F061 Catatonic disorder due to known physiological condition: Secondary | ICD-10-CM

## 2023-11-16 DIAGNOSIS — F251 Schizoaffective disorder, depressive type: Secondary | ICD-10-CM | POA: Diagnosis not present

## 2023-11-16 DIAGNOSIS — F411 Generalized anxiety disorder: Secondary | ICD-10-CM | POA: Diagnosis not present

## 2023-11-16 DIAGNOSIS — F5105 Insomnia due to other mental disorder: Secondary | ICD-10-CM

## 2023-11-16 DIAGNOSIS — R5383 Other fatigue: Secondary | ICD-10-CM | POA: Diagnosis not present

## 2023-11-16 MED ORDER — AUSTEDO XR 24 MG PO TB24: 1.0000 | ORAL_TABLET | Freq: Every day | ORAL | 2 refills | Status: DC

## 2023-11-16 NOTE — Progress Notes (Signed)
Neil [name] [id number] 1941/03/27 82 y.o.     Subjective:   Patient ID:  Neil [name] is a 82 y.o. (DOB Oct 12, 1941) male.  Chief Complaint:  Chief Complaint  Patient presents with   Follow-up   Medication Reaction    Medication Refill Pertinent negatives include no fatigue.  Anxiety Symptoms include nervous/anxious behavior. Patient reports no confusion, decreased concentration, dizziness or palpitations.    Depression        Associated symptoms include no decreased concentration, no fatigue and no appetite change.  Past medical history includes anxiety.     Neil [name] presents today for follow-up of psychosis, depression, and anxiety.  He has required an urgent appointment because of wanting further med changes and frequent phone calls with med changes since he was last here  When seen September 3 he was switched off olanzapine because of EPS and switched on to Saphris which was raised to 5 mg nightly but only took that dose 1 time.  The rest of the time he took only 2.5 mg nightly.   He called back 5 days later stating he wanted to stop it because it was causing him to hold his mouth open and he was having a worsening time with tongue movements and felt "funny".  He was encouraged to give it more time but he would not agree to do so.  He stopped it Friday 9/11 and still felt the funny tense feeling.    When  seen September 01, 2019.  The decision was made to switch from [place] to [place] because of difficulty getting an adequate response and limited side effects from [place].  We also discussed the antipsychotic and the plan was to start Caplyta but those samples were not available so he decided to restart Saphris 2.5 mg nightly.  At the visit September 15, 2019 [name] was increased to 12 mg twice daily, Saphris was discontinued and Caplyta 42 mg daily was started because of his low EPS risk.  He still needed an antipsychotic.  He continued on sertraline 75 mg daily  because he felt it helped his depression.  Wife called 10/8 and said the following: On Caplyta,  Thinks it is too strong.  His movements are slow, sleeping too much, drowsy during day ( doesn't really wake up until 4pm after sleeping all night). Takes him along time to think and respond. Had trouble with mouth and jaw before, and now he is holding his mouth open all the time.  Paranoia is better though. Was told to take it 3 days weekly.    October 16 [name] increased to 18 mg BID  Called again November 9 stating he couldn't tolerate either med.  He stopped the Caplyta and reduced the [name] to 9 mg BID a few days ago.  CO rigidity in lower back and arms.  Still bothered by constant tongue movements.  No change in worry, mood but paranoia is reduced since Caplyta.   visit October 25, 2019.  He had stopped Caplyta.  He had also reduced [name] to 9 mg twice daily on his own.  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  He is very impatient with med changes.  Therefore we did not start a new antipsychotic at the last visit.  He tends to make frequent phone calls and  did call again on November 23 complaining of feeling very restless and difficulty sleeping.  Was prescribed clonazepam 0.5 mg twice daily.  He called back the next day stating he wanted to stop the [name] due to restlessness but he had never taken the prescribed clonazepam.  Using courage to follow the treatment plan and take the clonazepam.  He called again the next day stating he still wanted to stop [name] and the clonazepam was making his gait unsteady.  He was informed that if he stopped the [name] his mouth movements would get worse which is been a leading complaint but he could stop it if he wanted to.  December 4 he reports clonazepam has helped  the anxiety and less guilt and less rigidity.  Taking TID and only SE is a little slower movements.  1 fall, slipped on Ba floor where water was lying.  No injury.   Gained 3# and better appetite for the first time in a long time.  CO tongue movements.  Restlessness is now bearable.  Less anxiety overall.  No AH.    visit December 4.  His psychosis was not severe and the decision was made not to start another antipsychotic but instead to try to optimize treatment for tardive dyskinesia and [name] was increased to 12 mg twice daily. He called back immediately after the head visit admitting he had given false information during the visit and that he not actually been taking the [name] in the way he told me during the visit making it necessary to change the [name] to 9 mg twice daily.  He was confronted about his noncompliance and warned that he may be discharged from the practice if he continued to be misleading and noncompliant with the physician.  This is not only compromising care but it is increasing his own risk because he is giving false information.   visit December 21, 2019.  Because of his complaints about balance issues clonazepam was reduced and spread out to 0.25 mg 4 times daily.  Also for tardive dyskinesia symptoms [name] was increased to 12 mg twice daily.  Prior to that visit he admitted being generally noncompliant with the [name] making it difficult to judge what dose to prescribe.  He was continued on sertraline 75 mg daily as he felt that was helpful for his anxiety. Says he's compliant with med changes last visit.  Tongue movements are better but not gone. No problems with the [name].  Stiffness comes and goes and located in head and eyes.  Denies it's vertigo.  Intensity is less.  If shuts his eyes it goes right away.  visit January 04, 2020.  [name] remained 12 mg twice daily and sertraline 75 mg daily.  The following was changed: switch to lorazepam bc tiredness and balance  problems to Ativan. Yes DC clonazepam and start lorazepam 0.5 mg 4 times daily  seen February 8 the patient reports the following.  Yes it did help.  Not as slowed and balance is good now.  Still feels tired with tendency to want to lay down and knows he needs to work on this.  Anxiety is pretty fair.  We decided to reduce the lorazepam to 0.5 mg 3 times daily in hopes of improving energy and balance further.  No other meds were changed.  He is still not on an antipsychotic.  Anxiety comes and goes but sometimes able to get by with TID lorazepam and energy is better.  Wife says sometimes gets uptight  and can't relax so needs QID.Pt reports that mood is anxiety aand not much depression and describes anxiety as worry. Anxiety symptoms include: worry not panic and moderate.   Sleep is better usually with hydroxyzine and sometimes lorazepam. Pt reports that appetite is Much better with weight up to 131# as low as 120#.  Normal was 140#.Marland Kitchen Pt reports that energy is better and OK. Concentration is fair.. Suicidal thoughts:  None. Denies sig paranoid thought.  Plan: No med change except okay to use hydroxyzine 25 mg nightly for insomnia.  Per usual the patient tends to make multiple phone calls between appointments.  He is called about 7 times since March.  06/04/2020 appointment with the following noted: Seen with his wife. Reports hydroxyzine 25 mg is not helpful for sleep. Still moving tongue. No SE meds currently. Not depressed.  Is anxious persistent in the back ground and not even connected to thoughts. Little intrusive thougts.   Sleep concerns, to bed 830 an not sure when goes to sleep.  Sometimes can't go to sleep. Get OOB about 9 AM.  Usually goes  back to bed.  CO weakness for 18 mos. Needs Aortic valve replacement. He tried reducing lorazepam to 0.5 mg TID and had a lot of restlessness but some improvement in energy.  Wonders if fatigue related to meds or heart valve problem.  Probably will have  heart surgery. Plan: Try to reduce lorazepam to 3 and 1/2 tablets daily.  to see if energy is better.  Keep the lowest effective dose possible.   Do not exceed 4 Ativan a day. No new antipsychotics started today. Increase [name] for 1 week to 1 of 6 mg and 1 of the 9 mg tablets twice daily for 1 week, Then increase again to 12 mg tablets 1 and 1/2 tablets twice daily or 18 mg twice daily. Continue sertraline 75 mg daily for anxiety.  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  Recovering slowly.  Just started walking a week or 2 ago. Neil [name] handles the meds. [name] doesn't control tongue movements but it helps. Done fine without lorazepam and less drowsy per wife. Taking hydroxyzine 25 mg for sleep with Tylenol.  Sleeping well per wife.   Wife thinks overall he's doing well from mental health perspective and seems calmer.  Pt says the psychosis has lightened up and he's grateful for that.  No depression.   Wants to try to stop hydroxyzine. Wants to try to reduce sertraline to 50 mg to prevent cutting.   Plan: He wants to reduce sertraline to 50 daily.  He says he's less depressed and it has helped his anxiety. Disc the risk of increased depression and anxiety but since 3 mos out from surgery OK. DC hydroxyzine if possible.  11/05/2020 patient called for refill of hydroxyzine and was taking 12.5 mg.  Dosage reduced to 10 mg.  02/14/2021 phone call from his wife Neil [name] concerned about his tongue movements and his anxiety. MD response: The patient and his wife are elderly.  He seems to forget the purpose of the medication and have some difficulty comprehending the conditions for which he is treated as well as the purpose of the meds and side effect issues. He is also somewhat impatient and anxious wanting changes to be made quickly. The answer to the question is the [name] is to treat the symptoms of his tongue movements.  If he stops the [name] the tongue  movements will be worse.  The [name] is not causing the tongue movements.  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 He has an appointment with me in 3 weeks.  I do not want to make any med changes over the phone or even med adjustments.  I would urge him to come into the office at that visit.  I do not do not want a televisit.  I want to be able to see him so that I can observe his tongue movements.  03/26/2021 appointment with following noted: Taking [name] 18 mg BID. Claims compliance. Started to feel more fatigued and went to See Dr. Tally Joe 02/25/21 with normal labs including: Comprehensive metabolic panel normal except sodium 132, normal CBC, TSH 2.24, B12 538 normal Fatigue is some better 20%.  Wants to get back in bed after breakfast.  For unclear reasons hydroxyzine raised again to 25 mg nightly. Some initial insomnia and some awakening.  Nap 1 1/2 hour in AM and another hour in afternoon. Goes to bed 730PM and up 8 AM. Tongue movements persisted until 2 weeks ago and has gotten better. Reduced sertraline to 50 mg and anxiety is no worse. Not depressed. Reads about sE [name] and wonders if it is causing fatigue.  Plan: To address fatigue and cognition: Reduce hydroxyzine again to 10 mg and try to stop it. Stop hydroxyzine 25 mg at night Start hydroxyzine 10 mg tablets 1 at night Start Dayvigo 5 mg tablet 1 at night using samples.  If it helps then stop the hydroxyzine to help your fatigue and memory Spending way too much time in bed.  03/31/2021 phone call: Oliva Bustard is not covered until Belsomra is tried.  Patient was to pick up samples of Belsomra. 04/02/2021 phone call: Complaining of too sleepy on trazodone 50 mg nightly.  Suggested they try half of tablet and if is still a problem stop it. 04/29/2021 phone call with questions and concerns about the [name].  Wife saying that he had problems with rigidity in his arms and hands. MD response: The patient and his wife  call fairly often with complaints that very between excessive tongue movements associated with tardive dyskinesia and on the alternative side the sort of complaints that could be related to parkinsonian side effects of [name].  So if he takes too little [name] his tongue movements will get worse but if he takes too much he may shuffle more than usual.  Even at his baseline before he took [name] he tended to shuffle his feet and have a reduced arm swing.  He is currently on 12 mg [name] 2 tablets twice daily.  If we reduce it we should reduce it only slightly to 12+9 mg tablets 1 of each twice daily.  My advice is that we not make the any med change until his next appointment.  At his last appointment he stated the tongue movements had only recently gotten better and if we reduce the dose they may get worse again.  06/25/2021 appointment with the following noted: He reduced [name] on his own to 1 and 1/4 of 9 mg tablets about a week ago bc sleepiness, rigidity of arms and legs and fatigue partly better.  Tongue movements have come back a little. No sleep meds needed.  Tired and lays in bed a lot. Minimal anxiety and depression. Has stopped hydroxyzine. Started OTC TremorSmoothe prn. Wants to come off [name] bc sx noted.   Always tended to sleep a lot. Occ recurring disturbing dream. Plan: To address fatigue,  sleepiness and cognition: After another week of [name] 9 mg tablets 1 and 1/4 tablets twice daily he can reduce it to 1 tablet twice daily but risk return of TD coming off of this as he wants to do. TD is managed.  09/25/2021 appointment with the following noted: He reduced [name] from 18 mg BID to 13.5 mg BID and feels it's better with less general tension in his body.  Lately energy better but not right after reducing the med. No worsening of tongue movements which come and go. Depression wife says the weakness he feels is psychological bc likes to sleep a lot.  Not sure how long it's  been going on.  To bed 7:30 and gets up about 830.  Walks and then back to bed until about 1130.  Sleeps some.  Chronic tiredness.   Still takes Zoloft 50.   Nervousness is there in his body but not worrying about anything.   pLan no med changes  10/06/2021 phone call asking to increase [name] to 12 mg twice daily.  That dosage was changed as requested.  10/15/2021 phone call from patient's wife Neil [name] stating bupropion ER 100 mg every morning made him too hyper and nervous and he stopped it.  10/21/2021 phone call from wife stating with the change in [name] dose the wife thinks his TD symptoms are worse.  On 12 mg twice daily versus 9 mg tablets 1-1/2 twice daily.  So the 9 mg dose was sent back in.  12/03/2021 appointment with the following noted: Taking [name] 9 mg TID, sertraline 50, no Wellbutrin. Fair.  Tiredness not as pronounced as it was.  Not sure why. Not sure about depression, I have something.  Can't describe it but not worrying a lot.  Has had a number of stressors.  Nerves on edge at times.  Trouble resting but still sleeps a lot.  It comes and goes.  Sleeps 10 hours. Not that productive and doesn't do much.  Enjoyed son and his family for Christmas. TD better not gone.  Worse under stress.   Not going to church DT fear of Covid and history of heart operation. Plan: Increase  sertraline to 75 mg bc of "nerves on edge".    02/09/22 appt noted: Increase apptetite and  wt from 145 to 159#.  In the last couple of months. Less anxiety with nerves less on edge. [name] workig fair but tongue still moves. Depression better not gone. Energy better but fair. Sleep with benadryl 4 nightly. Plan: continue sertraline 75 and [name] 9 mg TID-QID  TC 03/06/22  4:09 PM Note If his anxiety persists into next week he can increase sertraline to 100 mg daily for anxiety.     06/08/22 appt noted: Continues sertraline 100 mg working better, [name] 9 mg 4 daily Tolerating meds.  Less SE   now than in the past but slow gait with short steps at times. Thinks tongue movements better with more [name]. Anxiety better controlled.  Depression not too bad. Went to church first time Sunday and that helped.  Had avoided bc Covid fears. Plans to attend person on Sunday mornings. Otherwise health is OK. Son helpful but fixated on rapture. Plan: Continue [name] 9 mg tablets 1 QID and sertraline 100   10/13/22 appt noted: Continues sertraline 100 mg working better, [name] 9 mg 4 daily Fair  with walking as the only real problem.  Sometimes short steps and not much endurance.  Has had PT in the past. Tongue  movmements generally controlled.  When cut back to [name] TID he complained of more tongue movements. He also tried stopping it all together also. 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  He has not really tried TID recommend trial to see if gait is better.    02/10/23 appt noted: Dropped [name] to TID and worsening TD including jaw and tongue movementso increased back to QID.  Took a while to notice any difference, but better now. Overall now doing better but "ill at ease at times" and will call for Neil [name] almost unconsciously with some anxiety. It comes and goes daily.  There most of the time and can occur without triggers.   Not much depression.  Denies sign avoidance.   Sleep is better lately.  Taking melatonin but sleep is irregular.  Not drowsy in the day.  Some napping.  03/02/23 TC pt complaining of vague feeling discomfort since here. MD resp:  This sounds the same as his complaints at the visit 2/28 "Overall now doing better but "ill at ease at times" and will call for Neil [name] almost unconsciously with some anxiety. It comes and goes daily. There most of the time and can occur without triggers. "   We raised the dose to 150 mg sertraline at that time.  Give it 2 more weeks  bc most of the benefit will probably happen in the next 2 weeks.  If he is not improving by then we will reduce sertraline to 1 daily.    CMA TC with wife: Called wife to let her know recommendations and she had not increased dose from 100 to 150 mg. She thought one tablet was 150 mg.  I told her to cut pill in half and give 1 whole pill and 1 half pill. Asked that she let us know if 2 weeks how he was doing, sooner if needed.       05/12/23 appt noted:  seen in office Meds : incr sertraline to 150 mg mid March Better since of well being since increase sertaline.   No SE noted. More generally anxious without reason when goes to bed.  Sleep 10 to 8.   Feels he needs current dose Austed 9 mg QID bc uncontrollable tongue movements with less.  They are manageable with this dose. No par.  No fear.  Satisfied with current meds.  09/15/23 appt noted: Meds: sertraline 150, [name] 9 QID SE slow gait Been fair overall.  Anxiety is a little better.  Not dep. Not much trouble with tongue movments anymore. Plan: reduce [name] by switching to XR 30 mg  to see if gait is freer.  11/16/23 appt noted: 2-3 weeks after switch movements worsened but able to stay on [name] XR 30 mg daily. Tongue movements not gone but wax and wane and manageable. Feels looser in legs and gait better with less [name] but took until recently to get better.   Thinks hands stiffer from [name] also. Wants to try reducing [name] again if possible. Meds: sertraline 150, [name] XR 30 mg daily.   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  Son Neil [name] living with them.  Become more committed [place].  He has schizophrenia.   Past Psychiatric Medication Trials: Mirtazapine,  olanzapine 5 EPS, perphenazine started TD with mouth px,  Saphris 5 mg and stopped due to EPS complaints and feeling "funny" Caplyta side effects risperidone 2  tremors  Buspirone,   amitriptyline 25,  sertraline 50 Wellbutrin SR 100  nervous Hydroxyzine 10-20 Clonazepam 0.5 TID dizzy and sleepy Ativan 0.5 mg QID Ingrezza and  [name] 18 mg BID  Review of Systems:  Review of Systems  Constitutional:  Negative for appetite change and fatigue.  Cardiovascular:  Negative for palpitations.  Gastrointestinal:  Negative for constipation.  Musculoskeletal:  Positive for gait problem.  Neurological:  Negative for dizziness, tremors and syncope.       No falls.  Slow gait.   Psychiatric/Behavioral:  Negative for agitation, behavioral problems, confusion, decreased concentration, dysphoric mood, hallucinations, self-injury and sleep disturbance. The patient is nervous/anxious. The patient is not hyperactive.        Sleepy  CABG 07/19/20  Medications: I have reviewed the patient's current medications.  Current Outpatient Medications  Medication Sig Dispense Refill   acetaminophen (TYLENOL) 500 MG tablet Take 500 mg by mouth daily.     atorvastatin (LIPITOR) 20 MG tablet TAKE 1 TABLET BY MOUTH EVERY DAY 90 tablet 0   clopidogrel (PLAVIX) 75 MG tablet TAKE 1 TABLET BY MOUTH EVERY DAY 90 tablet 3   Deutetrabenazine ER ([name] XR) 24 MG TB24 Take 1 tablet by mouth daily. 30 tablet 2   metoprolol succinate (TOPROL XL) 25 MG 24 hr tablet Take 1 tablet (25 mg total) by mouth daily. 90 tablet 3   Multiple Vitamin (MULTIVITAMIN WITH MINERALS) TABS tablet Take 2 tablets by mouth daily. 50 +     sennosides-docusate sodium (SENOKOT-S) 8.6-50 MG tablet Take 1 tablet by mouth daily with supper.     sertraline (ZOLOFT) 50 MG tablet Take 3 tablets (150 mg total) by mouth daily. 270 tablet 1   silodosin (RAPAFLO) 4 MG CAPS capsule Take 4 mg by mouth daily with breakfast.     VITAMIN D PO Take 1 capsule by mouth daily.     No current facility-administered medications for this visit.    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   LIMA to LAD, Sequential SVG to OM1-OM3, SVG to RCA, EVH via left thigh   Schizophrenia (HCC)    Severe aortic stenosis     Family History  Problem Relation Age of Onset   Hypertension Mother    Heart attack Father    Hypertension Sister    Hypertension Brother     Social History   Socioeconomic History   Marital status: Married    Spouse name: Not on file   Number of children: Not on file   Years of education: Not on file   Highest education level: Not on file  Occupational History   Not on file  Tobacco Use   Smoking status: Never   Smokeless tobacco: Never  Vaping Use   Vaping  status: Never Used  Substance and Sexual Activity   Alcohol use: No   Drug use: Never   Sexual activity: Not on file  Other Topics Concern   Not on file  Social History Narrative   Not on file   Social Determinants of Health   Financial Resource Strain: Not on file  Food Insecurity: Not on file  Transportation Needs: Not on file  Physical Activity: Not on file  Stress: Not on file  Social Connections: Not on file  Intimate Partner Violence: Not on file    Past Medical History, Surgical history, Social history, and Family history were reviewed and updated as appropriate.   Please see review of systems for further details on the patient's review from today.   Objective:   Physical Exam:  There were no vitals taken for this visit.  Physical  Exam HENT:     Right Ear: Decreased hearing noted.  Neurological:     Mental Status: He is alert and oriented to person, place, and time.     Cranial Nerves: No dysarthria.     Gait: Gait abnormal.     Comments: Reduced arm swing and shortened gait maybe worse Mild tongue rolling is better but not gone Min incr motor tone UE bilat  Psychiatric:        Attention and Perception: Attention and perception normal.        Mood and Affect: Mood is anxious. Mood is not depressed.        Speech: Speech normal.        Behavior: Behavior is cooperative.        Thought Content: Thought content normal. Thought content is not paranoid or delusional. Thought content does not include homicidal or suicidal ideation. Thought content does not include suicidal plan.        Cognition and Memory: Cognition and memory normal.        Judgment: Judgment normal.     Comments: Insight intact     Lab Review:     Component Value Date/Time   NA 132 (L) 07/24/2020 0400   NA 130 (L) 07/09/2020 1508   K 3.4 (L) 07/24/2020 0400   CL 96 (L) 07/24/2020 0400   CO2 24 07/24/2020 0400   GLUCOSE 115 (H) 07/24/2020 0400   BUN 20 07/24/2020 0400   BUN 16 07/09/2020 1508   CREATININE 0.74 07/24/2020 0400   CALCIUM 7.6 (L) 07/24/2020 0400   PROT 6.6 07/17/2020 0920   PROT 6.6 07/09/2020 1508   ALBUMIN 3.8 07/17/2020 0920   ALBUMIN 4.4 07/09/2020 1508   AST 18 07/17/2020 0920   ALT 22 07/17/2020 0920   ALKPHOS 54 07/17/2020 0920   BILITOT 0.7 07/17/2020 0920   BILITOT 0.4 07/09/2020 1508   GFRNONAA >60 07/24/2020 0400   GFRAA >60 07/24/2020 0400       Component Value Date/Time   WBC 14.0 (H) 07/23/2020 0645   RBC 3.78 (L) 07/23/2020 0645   HGB 11.9 (L) 07/23/2020 0645   HGB 13.5 06/18/2020 1056   HCT 35.1 (L) 07/23/2020 0645   HCT 38.6 06/18/2020 1056   PLT 136 (L) 07/23/2020 0645   PLT 243 06/18/2020 1056   MCV 92.9 07/23/2020 0645   MCV 90 06/18/2020 1056   MCH 31.5 07/23/2020 0645   MCHC 33.9  07/23/2020 0645   RDW 13.7 07/23/2020 0645   RDW 13.0 06/18/2020 1056   LYMPHSABS 1.7 06/18/2020 1056   EOSABS 0.4 06/18/2020 1056   BASOSABS 0.0 06/18/2020 1056  I will  No results found for: "POCLITH", "LITHIUM"   No results found for: "PHENYTOIN", "PHENOBARB", "VALPROATE", "CBMZ"   AIMS    Flowsheet Row Office Visit from 06/04/2020 in [facility]  AIMS Total Score 22        .res Assessment: Plan:    Kordell was seen today for follow-up and medication reaction.  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  Fatigue, unspecified type  Insomnia due to mental condition      30 min non-face to face time with patient was spent on counseling and coordination of care. We discussed the following.   We discussed Patient has history of severe recurrent major depression with psychotic features.  There are also elements of OCD.   His paranoid is improved.    Stopped Benadryl.  Taking melatonin.  Disc sleep hygiene.  His son has schizophrenia so there may be a genetic predisposition to psychosis.  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  Both son and pt have history of slowness of gait, reduced arm swing , short steps that predate any history of antipsychotic.  This is ongoing and ? Worse with [name].  He does not have concerned.  He has benefited with reduction in anxiety from the use of sertraline . Still has frequent anxiey daily abut is better Again his depression is better with the sertraline.  Less shame but that may be mild paranoia or anxiety and not  depression. Better with increase sertraline to 150 mg bc anxiety     Disc SE in detail and SSRI withdrawal sx. Call if a problem  Discussed his tendency to change his medicines on his own.  There is significant risk in doing so.    Per his request reduce [name] by switching to XR 24 mg to see if gait is freer. TD is manageable but not gone.   Meds help a lot and are tolerated.  FU 2 mos   Meredith Staggers, MD, DFAPA     Future Appointments  Date Time Provider Department Center  01/18/2024  1:00 PM Cottle, Steva Ready., MD [facility] None        No orders of the defined types were placed in this encounter.      -------------------------------

## 2023-11-22 ENCOUNTER — Telehealth: Payer: Self-pay | Admitting: Psychiatry

## 2023-11-22 NOTE — Telephone Encounter (Signed)
Neil [name] called in to report that Neil [name] took the increased dose of [name] and it is just not working. His tongue and jaw are moving too much and its uncomfortable and he cannot sleep or rest. Should they reduce back down to 1 pll per day?

## 2023-11-22 NOTE — Telephone Encounter (Signed)
Please see message from patient's wife.   Last visit:  Per his request reduce [name] by switching to XR 24 mg to see if gait is freer.

## 2023-11-23 ENCOUNTER — Other Ambulatory Visit: Payer: Self-pay

## 2023-11-23 MED ORDER — AUSTEDO XR 30 MG PO TB24: 1.0000 | ORAL_TABLET | Freq: Every day | ORAL | 0 refills | Status: DC

## 2023-11-23 NOTE — Telephone Encounter (Signed)
To be clear, we did not increase the dose of the [name].  We reduced the dose of the [name] from XR 30 mg XR 24 mg per his request.  He was aware that this could lead to worsening of his tongue movements which is apparently what happened.  He may have some 30 mg tablets left and if so he should use them.  If needed please send an [name] 1 month prescription of the XR 30 mg tablets into the appropriate pharmacy.

## 2023-11-23 NOTE — Telephone Encounter (Signed)
LVM to RC 

## 2023-11-23 NOTE — Telephone Encounter (Signed)
Wife notified of recommendations. Requested Rx of [name] 30 ER to [facility]. Rx sent.

## 2023-11-25 ENCOUNTER — Telehealth: Payer: Self-pay | Admitting: Internal Medicine

## 2023-11-25 NOTE — Telephone Encounter (Signed)
Spoke with pt wife, she reports he is feeling a lot better. She does report he was not able to tolerate rosuvastatin either. Aware dr Rennis Golden is not in the office this afternoon but will forward for his recommendations.

## 2023-11-25 NOTE — Telephone Encounter (Signed)
Pt has been off the Atorvastatin for two weeks per Dr Rennis Golden. He is feeling better and not so weak feeling. Now wife wants to know if there is something else he can take for his cholesterol

## 2023-11-25 NOTE — Telephone Encounter (Signed)
Left message for pt to call.

## 2023-11-26 NOTE — Telephone Encounter (Signed)
Called Pt no answer.

## 2023-11-29 NOTE — Telephone Encounter (Signed)
Patient identification verified by 2 forms. Marilynn Rail, RN    Called and spoke to patients wife Bonita Quin  Patient scheduled for Mychart virtual visit 1/7 at 4:20pm  Bonita Quin agrees, no questions at this time

## 2023-11-29 NOTE — Telephone Encounter (Signed)
Wife Bonita Quin) returned RN's call.

## 2023-11-29 NOTE — Telephone Encounter (Signed)
Chrystie Nose, MD  Freddi Starr, RN4 days ago    He will probably need a PCSK9 inhibitor - will need to discuss risk/benefit and administration to get prior authorization - can you schedule a virtual visit with me to do this?  Thanks,  Dr. Rexene Edison

## 2023-12-06 ENCOUNTER — Other Ambulatory Visit: Payer: Self-pay | Admitting: Internal Medicine

## 2023-12-13 ENCOUNTER — Other Ambulatory Visit: Payer: Self-pay | Admitting: Psychiatry

## 2023-12-21 ENCOUNTER — Encounter: Payer: Self-pay | Admitting: Internal Medicine

## 2023-12-21 ENCOUNTER — Ambulatory Visit: Payer: Medicare HMO | Attending: Internal Medicine | Admitting: Internal Medicine

## 2023-12-21 VITALS — BP 132/70 | HR 53 | Ht 64.5 in | Wt 160.0 lb

## 2023-12-21 DIAGNOSIS — I350 Nonrheumatic aortic (valve) stenosis: Secondary | ICD-10-CM

## 2023-12-21 DIAGNOSIS — E785 Hyperlipidemia, unspecified: Secondary | ICD-10-CM

## 2023-12-21 DIAGNOSIS — Z951 Presence of aortocoronary bypass graft: Secondary | ICD-10-CM

## 2023-12-21 DIAGNOSIS — M791 Myalgia, unspecified site: Secondary | ICD-10-CM

## 2023-12-21 NOTE — Progress Notes (Signed)
 Virtual Visit via Video Note   Because of Neil [name]'s co-morbid illnesses, he is at least at moderate risk for complications without adequate follow up.  This format is felt to be most appropriate for this patient at this time.  All issues noted in this document were discussed and addressed.  A limited physical exam was performed with this format.  Please refer to the patient's chart for his consent to telehealth for [facility].      Date:  12/21/2023   ID:  Neil [name], DOB Aug 25, 1941, MRN [id number] The patient was identified using 2 identifiers.  Evaluation Performed:  Follow-Up Visit  Patient Location:  [address] [place] [address]  Provider location:   [address], [address] [place], [place] [address]  PCP:  Seabron Lenis, MD  Cardiologist:  Vinie JAYSON Maxcy, MD Electrophysiologist:  None   Chief Complaint:  Statin intolerance  History of Present Illness:    Neil [name] is a 83 y.o. male who presents via audio/video conferencing for a telehealth visit today.  this is a pleasant male kindly referred for evaluation and management of severe aortic stenosis.  Apparently he has been followed for an abnormal heart murmur which she says he is had for a long time, possibly since he was a child.  He recalls having had an echo with [facility] about 5 or 6 years ago and was told that he had aortic valve disease but clearly has not had follow-up closely until recently.  His PCP noted a change in the quality of his murmur and a repeat echo was ordered.  This was performed at [facility] with outside reading, and I personally reviewed the report but did not have access to the images.  This demonstrated an LVEF of 60 to 65% with mild LVH, normal left atrial size, moderate calcification of the mitral valve leaflets and minimal mitral regurgitation without mitral stenosis.  The aortic valve was moderately calcified and there was severe aortic stenosis.  The mean  gradient across the aortic valve was 64 mmHg and a peak gradient of 90 mmHg.  The aortic root diameter was normal.  This was discussed with the patient and he was felt to be asymptomatic, flatly denying any chest pain, worsening shortness of breath with exertion, presyncope or syncopal episodes.   10/25/2020   Neil [name] returns today for follow-up.  This is the first time I have seen him since his initial office visit which time he was found to have critical aortic stenosis and sent fairly urgently for cardiac catheterization.  He subsequently underwent bioprosthetic AVR with Edwards Inspriris Resilia pericardial tissue valve (21 mm) and aortic root enlargement using bovine pericardial patch as well as CABG x4 (LIMA to LAD, SVG to distal right coronary, SVG to OM1 and sequential SVG to OM 3).  While this went well there was some postoperative bleeding and he was taken back to the operating room later for reexploration which demonstrated diffuse coagulopathy and bleeding from the chest wall. He was ultimately discharged.  In follow-up he was noted to have complaints of increasing lower extremity edema and dyspnea.  Chest x-ray was performed and demonstrated interval development of moderate left pleural effusion.  He subsequently underwent thoracentesis on the left on August 26 with marked improvement in his symptoms.  No recurrence was noted thereafter.  He was last seen by Dr. Dusty on September 23, 2020 and felt to be doing well.  He had deferred management of  several medications and a repeat echo to me.  This is following an echo postoperatively which showed some reduction in LVEF down to 45 to 50% with incoordinate septal motion.  Is possible this degree of heart failure may have been related to his postoperative pleural effusion.  His wife reports he has been doing well with physical therapy.  He is now walking without a walker.  The only other concern was that he had low blood pressure.  His PCP took him off  of his lisinopril  due to this.   10/26/2022   Neil [name] is seen today in follow-up.  Overall he seems to be doing well and has recovered from aortic root and valve surgery as well as bypass grafting in 2021.  Per his report he struggled more with the effects of depression and tardive dyskinesia which he developed as a result of his medications.  He had some difficulty in expressing himself today.  Cardiac standpoint he denies any chest pain or worsening shortness of breath.  He had a repeat echo in April of this year which showed no abnormalities of the aortic valve and normal LV function.  12/21/2023  Neil [name] is seen today for virtual follow-up.  Recently he called and noted that he had significant muscle aches and weakness on his statin therapy.  I advise discontinuing that.  He notes that he has improved substantially off medication.  Unfortunately his cholesterol was still suboptimally controlled on the statin.  Total cholesterol in July 2024 was 159, HDL 35, triglycerides 141 and LDL 99.  Given his history of prior coronary bypass grafting, his target LDL should be less than 70.  Today we are discussing alternatives and I proposed a PCSK9 inhibitor.  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   LIMA to LAD, Sequential SVG to OM1-OM3, SVG to RCA, EVH via left thigh   Schizophrenia (HCC)    Severe aortic stenosis     Past Surgical History:  Procedure Laterality Date   AORTIC ROOT ENLARGEMENT N/A 07/19/2020   Procedure: AORTIC ROOT ENLARGEMENT USING 6CM x 8CM PERI-GUARD BOVINE PERICARDIUM PATCH;  Surgeon: Dusty Sudie DEL, MD;  Location: [facility] OR;  Service: Open  Heart Surgery;  Laterality: N/A;   AORTIC VALVE REPLACEMENT N/A 07/19/2020   Procedure: AORTIC VALVE REPLACEMENT (AVR) USING INSPIRIS RESILIA AORTIC VALVE;  Surgeon: Dusty Sudie DEL, MD;  Location: [facility] OR;  Service: Open Heart Surgery;  Laterality: N/A;   CORONARY ARTERY BYPASS GRAFT N/A 07/19/2020   Procedure: CORONARY ARTERY BYPASS GRAFTING (CABG), ON PUMP, TIMES FOUR, USING LEFT INTERNAL MAMMARY ARTERY AND ENDOSCOPICALLY HARVESTED LEFT GREATER SAPHENOUS VEIN;  Surgeon: Dusty Sudie DEL, MD;  Location: [facility] OR;  Service: Open Heart Surgery;  Laterality: N/A;   EXPLORATION POST OPERATIVE OPEN HEART N/A 07/19/2020   Procedure: EXPLORATION POST OPERATIVE OPEN HEART;  Surgeon: Dusty Sudie DEL, MD;  Location: [facility] OR;  Service: Open Heart Surgery;  Laterality: N/A;   EYE SURGERY     Bialteral lower eye lids   FOOT SURGERY Left    IR THORACENTESIS ASP PLEURAL SPACE W/IMG GUIDE  08/08/2020  LIPOMA EXCISION     RIGHT AND LEFT HEART CATH N/A 06/26/2020   Procedure: RIGHT AND LEFT HEART CATH;  Surgeon: Wonda Sharper, MD;  Location: [facility] INVAS[facility] CV LAB;  Service: Cardiovascular;  Laterality: N/A;   TEE WITHOUT CARDIOVERSION N/A 07/19/2020   Procedure: TRANSESOPHAGEAL ECHOCARDIOGRAM (TEE);  Surgeon: Dusty Sudie DEL, MD;  Location: [facility] OR;  Service: Open Heart Surgery;  Laterality: N/A;    FAMHx:  Family History  Problem Relation Age of Onset   Hypertension Mother    Heart attack Father    Hypertension Sister    Hypertension Brother     SOCHx:   reports that he has never smoked. He has never used smokeless tobacco. He reports that he does not drink alcohol and does not use drugs.  ALLERGIES:  Allergies  Allergen Reactions   Amitriptyline Hcl Other (See Comments)   Aspirin Swelling and Other (See Comments)    Tongue swelling    Elemental Sulfur Other (See Comments)    Reaction:  Unknown    Perphenazine  Other (See Comments)   Tetanus Toxoids Swelling and Other (See Comments)    Arm swelling    Tetanus-Diphtheria Toxoids Td Other (See Comments)    MEDS:  No outpatient medications have been marked as taking for the 12/21/23 encounter (Video Visit) with Mona Vinie BROCKS, MD.     ROS: Pertinent items noted in HPI and remainder of comprehensive ROS otherwise negative.  Labs/Other Tests and Data Reviewed:    Recent Labs: No results found for requested labs within last 365 days.   Recent Lipid Panel Lab Results  Component Value Date/Time   CHOL 148 01/23/2021 10:49 AM   TRIG 50 01/23/2021 10:49 AM   HDL 58 01/23/2021 10:49 AM   CHOLHDL 2.6 01/23/2021 10:49 AM   LDLCALC 79 01/23/2021 10:49 AM    Wt Readings from Last 3 Encounters:  12/21/23 160 lb (72.6 kg)  10/22/23 156 lb (70.8 kg)  10/26/22 161 lb (73 kg)     Exam:    Vital Signs:  BP 132/70 (BP Location: Left Arm, Patient Position: Sitting, Cuff Size: Normal)   Pulse (!) 53   Ht 5' 4.5 (1.638 m)   Wt 160 lb (72.6 kg)   BMI 27.04 kg/m    General appearance: alert and no distress Lungs: No visual respiratory difficulty Abdomen: Mildly overweight Extremities: extremities normal, atraumatic, no cyanosis or edema Neurologic: Mental status: Alert, oriented, thought content appropriate, symptoms of tardive dyskinesia  ASSESSMENT & PLAN:    Severe asymptomatic aortic stenosis-mean gradient 64 mmHg Status post bioprosthetic AVR (Edwards Inspiris Resilia 21 mm), aortic root patch enlargment (07/2020) Status post CABG x4 (LIMA to LAD, SVG to distal RCA, SVG to OM1 and sequential SVG to OM 3) - (07/2020) Dyslipidemia, goal LDL less than 70 Statin intolerance-myalgias, fatigue Depression with TD  Neil [name] had recent myalgias and fatigue on a statin but notes marked improvement after discontinuing it.  I think he will likely need an alternative for lipid-lowering to try to reach his target LDL less than 70 given a prior history of coronary artery disease and bypass surgery.  I am recommending a PCSK9 inhibitor.  I did  discuss the mechanism of action, risks, benefits and alternatives as well as a demonstration of an autoinjector pen over video today and he is willing to proceed. We will reach out for prior authorization for this.  Plan repeat labs including NMR and LP(a) in about 3 to 4 months and follow-up at  that time.  Patient Risk:   After full review of this patients clinical status, I feel that they are at least moderate risk at this time.  Time:   Today, I have spent 25 minutes with the patient with telehealth technology discussing dyslipidemia.     Medication Adjustments/Labs and Tests Ordered: Current medicines are reviewed at length with the patient today.  Concerns regarding medicines are outlined above.   Tests Ordered: No orders of the defined types were placed in this encounter.   Medication Changes: No orders of the defined types were placed in this encounter.   Disposition:  in 4 month(s)  Vinie KYM Maxcy, MD, [facility], FACP  [place]  [facility]  Medical Director of the Advanced Lipid Disorders &  Cardiovascular Risk Reduction Clinic Diplomate of the American Board of Clinical Lipidology Attending Cardiologist  Direct Dial: [phone]  Fax: [phone]  Website:  [email or web address]  Vinie JAYSON Maxcy, MD  12/21/2023 4:34 PM

## 2023-12-21 NOTE — Patient Instructions (Signed)
 Medication Instructions:  Dr. Mona recommends Repatha Sureclick or Praluent 150mg /mL (PCSK9). This is an injectable cholesterol medication self-administered once every 14 days. This medication will likely need prior approval with your insurance company, which we will work on. If the medication is not approved initially, we may need to do an appeal with your insurance.   Administer medication in area of fatty tissue such as abdomen, outer thigh, back of upper arm - and rotate site with each injection Store medication in refrigerator until ready to administer - allow to sit at room temp for 30 mins - 1 hour prior to injection Dispose of medication in a SHARPS container - your pharmacy should be able to direct you on this and proper disposal   If you need a co-pay card for Repatha: [email or web address] If you need a co-pay card for Praluent: [email or web address]  Patient Assistance:    These foundations have funds at various times.   The [employer]: https://www.panfoundation.org/disease-funds/hypercholesterolemia/ -- can sign up for wait list  The [facility] offers assistance to help pay for medication copays.  They will cover copays for all cholesterol lowering meds, including statins, fibrates, omega-3 fish oils like Vascepa, ezetimibe, Repatha, Praluent, Nexletol, Nexlizet .  The cards are usually good for $2,500 or 12 months, whichever comes first. Our fax # is [phone] (you will need this to apply) Go to healthwellfoundation.org Click on "Apply Now" Answer questions as to whom is applying (patient or representative) Your disease fund will be "hypercholesterolemia - Medicare access" They will ask questions about finances and which medications you are taking for cholesterol When you submit, the approval is usually within minutes.  You will need to print the card information from the site You will need to show this information to your  pharmacy, they will bill your Medicare Part D plan first -then bill Health Well --for the copay.   You can also call them at [phone], although the hold times can be quite long.     *If you need a refill on your cardiac medications before your next appointment, please call your pharmacy*   Lab Work: FASTING lab work to check cholesterol in 3-4 months  If you have labs (blood work) drawn today and your tests are completely normal, you will receive your results only by: MyChart Message (if you have MyChart) OR A paper copy in the mail If you have any lab test that is abnormal or we need to change your treatment, we will call you to review the results.    Follow-Up: At [facility], you and your health needs are our priority.  As part of our continuing mission to provide you with exceptional heart care, we have created designated Provider Care Teams.  These Care Teams include your primary Cardiologist (physician) and Advanced Practice Providers (APPs -  Physician Assistants and Nurse Practitioners) who all work together to provide you with the care you need, when you need it.  We recommend signing up for the patient portal called MyChart.  Sign up information is provided on this After Visit Summary.  MyChart is used to connect with patients for Virtual Visits (Telemedicine).  Patients are able to view lab/test results, encounter notes, upcoming appointments, etc.  Non-urgent messages can be sent to your provider as well.   To learn more about what you can do with MyChart, go to [email or web address].    Your next appointment:    3-4 months with Dr. Mona

## 2023-12-22 ENCOUNTER — Other Ambulatory Visit (HOSPITAL_COMMUNITY): Payer: Self-pay

## 2023-12-22 ENCOUNTER — Telehealth: Payer: Self-pay | Admitting: Pharmacy Technician

## 2023-12-22 ENCOUNTER — Telehealth: Payer: Self-pay | Admitting: Internal Medicine

## 2023-12-22 NOTE — Telephone Encounter (Signed)
 Patient identification verified by 2 forms. Bertina Cooks, RN    Called and spoke to patients wife Rock Rock states:   -concerned about cost of Repatha   -Husband would like update regarding copay and grant   -would like RX sent to [facility] so it can be mailed to home  Informed Rock:   -At this time no updates available, PA has no been submitted   -once PA completed they will be updated regarding cost   -RN noted preferred pharmacy  [facility] verbalized understanding, no questions at this time

## 2023-12-22 NOTE — Telephone Encounter (Signed)
 Pt spouse called in regarding pt's appt yesterday. They asked if Dr. Blanchie Dessert nurse will call to let them know the price/copay of the medication Dr. Rennis Golden wants to start him on. Please advise.

## 2023-12-22 NOTE — Telephone Encounter (Signed)
 Pharmacy Patient Advocate Encounter  Received notification from [employer] that Prior Authorization for repatha has been APPROVED from 12/15/23 to 12/13/24. Ran test claim, Copay is $141.42 one month. This test claim was processed through [facility]- copay amounts may vary at other pharmacies due to pharmacy/plan contracts, or as the patient moves through the different stages of their insurance plan.   PA #/Case ID/Reference #: [id number]

## 2023-12-22 NOTE — Telephone Encounter (Signed)
 Pharmacy Patient Advocate Encounter   Received notification from Physician's Office that prior authorization for repatha is required/requested.   Insurance verification completed.   The patient is insured through [employer] .   Per test claim: PA required; PA submitted to above mentioned insurance via CoverMyMeds Key/confirmation #/[id number] Status is pending

## 2023-12-22 NOTE — Telephone Encounter (Signed)
-----   Message from Nurse Eileen Stanford E sent at 12/21/2023  4:46 PM EST ----- Regarding: patient needs PA for Repatha -- thanks Patient of Dr. Rennis Golden who needs PA for Repatha   Dyslipidemia LDL goal less than 70 CABG/CAD Statin intolerant - myalgias

## 2023-12-23 NOTE — Telephone Encounter (Signed)
 Left message for patient to call back on [phone] [facility]) *Preferred*   Patient will likely want to apply for a copay grant from the [facility].  The [facility] offers assistance to help pay for medication copays.  They will cover copays for all cholesterol lowering meds, including statins, fibrates, omega-3 fish oils like Vascepa, ezetimibe, Repatha, Praluent, Nexletol, Nexlizet .  The cards are usually good for $2,500 or 12 months, whichever comes first. Our fax # is [phone] (you will need this to apply) Go to [email or web address] Click on "Apply Now" Answer questions as to whom is applying (patient or representative) Your disease fund will be "hypercholesterolemia - Medicare access" They will ask questions about finances and which medications you are taking for cholesterol When you submit, the approval is usually within minutes.  You will need to print the card information from the site You will need to show this information to your pharmacy, they will bill your Medicare Part D plan first -then bill [employer] Well --for the copay.   You can also call them at [phone], although the hold times can be quite long.

## 2023-12-23 NOTE — Telephone Encounter (Signed)
 Spoke with patient's wife. Explained that he should be able to get a healthwell grant, as wife states he already has one for [name] . She said [facility] coordinated him getting the grant. She said she wants to talk to her husband about this and will follow up. Informed her that I am out of the office after today until Tuesday. She will plan to call back Tuesday 1/14

## 2023-12-23 NOTE — Telephone Encounter (Signed)
 Left message for patient to call back to discuss Repatha and possible copay assistance

## 2023-12-28 ENCOUNTER — Telehealth: Payer: Self-pay | Admitting: Internal Medicine

## 2023-12-28 NOTE — Telephone Encounter (Signed)
 Chrystie Nose, MD  Lindell Spar, RN Caller: Unspecified (Today,  8:42 AM) His cholesterol is too high and should be treated - would they consider Nexlizet 180/10?  Otherwise, not many options.

## 2023-12-28 NOTE — Telephone Encounter (Signed)
 Patient called to follow-up with RN Eileen Stanford regarding Repatha medication.

## 2023-12-28 NOTE — Telephone Encounter (Signed)
 Spoke with patient's wife. Advised her of option of Nexlizet. Explained that LDL is too high given cardiac history and lipids need to be treated. She will discuss with patient and call back today or tomorrow.

## 2023-12-28 NOTE — Telephone Encounter (Signed)
 Spoke with patient's wife. Patient has decided that he does NOT want to take Repatha/injections. She said he has tried 2 statins. Will defer to MD for further recommendations.

## 2023-12-29 MED ORDER — NEXLIZET 180-10 MG PO TABS: 1.0000 | ORAL_TABLET | Freq: Every day | ORAL | 11 refills | Status: DC

## 2023-12-29 NOTE — Telephone Encounter (Signed)
 Spoke with patient's wife. Patient would like to try nexlizet . Explained that I'll send Rx to [place] and make comment that he wants to be enrolled for a healthwell foundation grant -- he already gets one for [name]  and wife said [facility]PH at this pharmacy enrolled him for this.   Notified PA team that prior auth is needed.

## 2023-12-29 NOTE — Telephone Encounter (Signed)
 Wife Stana Ear) wants a call back from RN Jenna to discuss patient starting Nexlizet .

## 2023-12-30 ENCOUNTER — Telehealth: Payer: Self-pay | Admitting: Pharmacy Technician

## 2023-12-30 ENCOUNTER — Other Ambulatory Visit (HOSPITAL_COMMUNITY): Payer: Self-pay

## 2023-12-30 NOTE — Telephone Encounter (Signed)
Pharmacy Patient Advocate Encounter   Received notification from Pt Calls Messages that prior authorization for Nexlizet is required/requested.   Insurance verification completed.   The patient is insured through [employer] .   Per test claim: Refill too soon. PA is not needed at this time. Medication was filled 12/29/23. Next eligible fill date is 01/21/24. It is filled at [facility]  I messaged back in the other message

## 2023-12-30 NOTE — Telephone Encounter (Signed)
Called pt's wife, Bonita Quin (ok per [facility]) regarding need for PA for Nexlizet is not required per test claim. Wife will still confirm with [place] that there is not a large co-pay accociated with this medication. Wife verbalizes understanding.

## 2023-12-30 NOTE — Telephone Encounter (Signed)
Pharmacy Patient Advocate Encounter   Received notification from Pt Calls Messages that prior authorization for Nexlizet is required/requested.   Insurance verification completed.   The patient is insured through [employer] .   Per test claim: Refill too soon. PA is not needed at this time. Medication was filled 12/29/23. Next eligible fill date is 01/21/24. It is filled at [facility]

## 2024-01-11 ENCOUNTER — Telehealth: Payer: Self-pay

## 2024-01-11 NOTE — Telephone Encounter (Signed)
Prior Authorization submitted for [name] XR 30 mg with [employer] Medicare Part D.

## 2024-01-14 NOTE — Telephone Encounter (Signed)
Prior Approval received effective through 12/13/2024 for [name] XR 30 mg with [employer]

## 2024-01-18 ENCOUNTER — Ambulatory Visit: Payer: Medicare HMO | Admitting: Psychiatry

## 2024-01-18 ENCOUNTER — Encounter: Payer: Self-pay | Admitting: Psychiatry

## 2024-01-18 DIAGNOSIS — R5383 Other fatigue: Secondary | ICD-10-CM

## 2024-01-18 DIAGNOSIS — F411 Generalized anxiety disorder: Secondary | ICD-10-CM

## 2024-01-18 DIAGNOSIS — G2401 Drug induced subacute dyskinesia: Secondary | ICD-10-CM | POA: Diagnosis not present

## 2024-01-18 DIAGNOSIS — F061 Catatonic disorder due to known physiological condition: Secondary | ICD-10-CM | POA: Diagnosis not present

## 2024-01-18 DIAGNOSIS — F251 Schizoaffective disorder, depressive type: Secondary | ICD-10-CM | POA: Diagnosis not present

## 2024-01-18 DIAGNOSIS — F5105 Insomnia due to other mental disorder: Secondary | ICD-10-CM | POA: Diagnosis not present

## 2024-01-18 MED ORDER — AUSTEDO XR 36 MG PO TB24: 1.0000 | ORAL_TABLET | Freq: Every day | ORAL | 2 refills | Status: DC

## 2024-01-18 NOTE — Progress Notes (Signed)
 [name] [id number] [birth year] 83 y.o.     Subjective:   Patient ID:  Neil [name] is a 83 y.o. (DOB 1941/08/09) male.  Chief Complaint:  Chief Complaint  Patient presents with   Follow-up   Depression   Anxiety   Medication Reaction    Medication Refill Pertinent negatives include no fatigue.  Anxiety Symptoms include nervous/anxious behavior. Patient reports no confusion, decreased concentration, dizziness or palpitations.    Depression        Associated symptoms include no decreased concentration, no fatigue and no appetite change.  Past medical history includes anxiety.     Neil [name] presents today for follow-up of psychosis, depression, and anxiety.  He has required an urgent appointment because of wanting further med changes and frequent phone calls with med changes since he was last here  When seen September 3 he was switched off olanzapine  because of EPS and switched on to Saphris which was raised to 5 mg nightly but only took that dose 1 time.  The rest of the time he took only 2.5 mg nightly.   He called back 5 days later stating he wanted to stop it because it was causing him to hold his mouth open and he was having a worsening time with tongue movements and felt funny.  He was encouraged to give it more time but he would not agree to do so.  He stopped it Friday 9/11 and still felt the funny tense feeling.    When  seen September 01, 2019.  The decision was made to switch from Ingrezza  to [name]  because of difficulty getting an adequate response and limited side effects from Ingrezza .  We also discussed the antipsychotic and the plan was to start Caplyta but those samples were not available so he decided to restart Saphris 2.5 mg nightly.  At the visit September 15, 2019 [name]  was increased to 12 mg twice daily, Saphris was discontinued and Caplyta 42 mg daily was started because of his low EPS risk.  He still needed an antipsychotic.  He continued on  sertraline  75 mg daily because he felt it helped his depression.  Wife called 10/8 and said the following: On Caplyta,  Thinks it is too strong.  His movements are slow, sleeping too much, drowsy during day ( doesn't really wake up until 4pm after sleeping all night). Takes him along time to think and respond. Had trouble with mouth and jaw before, and now he is holding his mouth open all the time.  Paranoia is better though. Was told to take it 3 days weekly.    October 16 [name]  increased to 18 mg BID  Called again November 9 stating he couldn't tolerate either med.  He stopped the Caplyta and reduced the [name]  to 9 mg BID a few days ago.  CO rigidity in lower back and arms.  Still bothered by constant tongue movements.  No change in worry, mood but paranoia is reduced since Caplyta.   visit October 25, 2019.  He had stopped Caplyta.  He had also reduced [name]  to 9 mg twice daily on his own.  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  He is very impatient with med changes.  Therefore we did not start a new antipsychotic at the last visit.  He tends  to make frequent phone calls and did call again on November 23 complaining of feeling very restless and difficulty sleeping.  Was prescribed clonazepam  0.5 mg twice daily.  He called back the next day stating he wanted to stop the [name]  due to restlessness but he had never taken the prescribed clonazepam .  Using courage to follow the treatment plan and take the clonazepam .  He called again the next day stating he still wanted to stop [name]  and the clonazepam  was making his gait unsteady.  He was informed that if he stopped the [name]  his mouth movements would get worse which is been a leading complaint but he could stop it if he wanted to.  December 4 he  reports clonazepam  has helped the anxiety and less guilt and less rigidity.  Taking TID and only SE is a little slower movements.  1 fall, slipped on Ba floor where water was lying.  No injury.   Gained 3# and better appetite for the first time in a long time.  CO tongue movements.  Restlessness is now bearable.  Less anxiety overall.  No AH.    visit December 4.  His psychosis was not severe and the decision was made not to start another antipsychotic but instead to try to optimize treatment for tardive dyskinesia and [name]  was increased to 12 mg twice daily. He called back immediately after the head visit admitting he had given false information during the visit and that he not actually been taking the [name]  in the way he told me during the visit making it necessary to change the [name]  to 9 mg twice daily.  He was confronted about his noncompliance and warned that he may be discharged from the practice if he continued to be misleading and noncompliant with the physician.  This is not only compromising care but it is increasing his own risk because he is giving false information.   visit December 21, 2019.  Because of his complaints about balance issues clonazepam  was reduced and spread out to 0.25 mg 4 times daily.  Also for tardive dyskinesia symptoms [name]  was increased to 12 mg twice daily.  Prior to that visit he admitted being generally noncompliant with the [name]  making it difficult to judge what dose to prescribe.  He was continued on sertraline  75 mg daily as he felt that was helpful for his anxiety. Says he's compliant with med changes last visit.  Tongue movements are better but not gone. No problems with the [name] .  Stiffness comes and goes and located in head and eyes.  Denies it's vertigo.  Intensity is less.  If shuts his eyes it goes right away.  visit January 04, 2020.  [name]  remained 12 mg twice daily and sertraline  75 mg daily.  The following was changed: switch to lorazepam   bc tiredness and balance problems to Ativan . Yes DC clonazepam  and start lorazepam  0.5 mg 4 times daily  seen February 8 the patient reports the following.  Yes it did help.  Not as slowed and balance is good now.  Still feels tired with tendency to want to lay down and knows he needs to work on this.  Anxiety is pretty fair.  We decided to reduce the lorazepam  to 0.5 mg 3 times daily in hopes of improving energy and balance further.  No other meds were changed.  He is still not on an antipsychotic.  Anxiety comes and goes but sometimes able to get by with TID lorazepam  and energy is better.  Wife says sometimes gets uptight and can't relax so needs QID.Pt reports that mood is anxiety aand not much depression and describes anxiety as worry. Anxiety symptoms include: worry not panic and moderate.   Sleep is better usually with hydroxyzine  and sometimes lorazepam . Pt reports that appetite is Much better with weight up to 131# as low as 120#.  Normal was 140#.SABRA Pt reports that energy is better and OK. Concentration is fair.. Suicidal thoughts:  None. Denies sig paranoid thought.  Plan: No med change except okay to use hydroxyzine  25 mg nightly for insomnia.  Per usual the patient tends to make multiple phone calls between appointments.  He is called about 7 times since March.  06/04/2020 appointment with the following noted: Seen with his wife. Reports hydroxyzine  25 mg is not helpful for sleep. Still moving tongue. No SE meds currently. Not depressed.  Is anxious persistent in the back ground and not even connected to thoughts. Little intrusive thougts.   Sleep concerns, to bed 830 an not sure when goes to sleep.  Sometimes can't go to sleep. Get OOB about 9 AM.  Usually goes  back to bed.  CO weakness for 18 mos. Needs Aortic valve replacement. He tried reducing lorazepam  to 0.5 mg TID and had a lot of restlessness but some improvement in energy.  Wonders if fatigue related to meds or heart valve  problem.  Probably will have heart surgery. Plan: Try to reduce lorazepam  to 3 and 1/2 tablets daily.  to see if energy is better.  Keep the lowest effective dose possible.   Do not exceed 4 Ativan  a day. No new antipsychotics started today. Increase [name]  for 1 week to 1 of 6 mg and 1 of the 9 mg tablets twice daily for 1 week, Then increase again to 12 mg tablets 1 and 1/2 tablets twice daily or 18 mg twice daily. Continue sertraline  75 mg daily for anxiety.  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  Recovering slowly.  Just started walking a week or 2 ago. LELON Handing handles the meds. [name]  doesn't control tongue movements but it helps. Done fine without lorazepam  and less drowsy per wife. Taking hydroxyzine  25 mg for sleep with Tylenol .  Sleeping well per wife.   Wife thinks overall he's doing well from mental health perspective and seems calmer.  Pt says the psychosis has lightened up and he's grateful for that.  No depression.   Wants to try to stop hydroxyzine . Wants to try to reduce sertraline  to 50 mg to prevent cutting.   Plan: He wants to reduce sertraline  to 50 daily.  He says he's less depressed and it has helped his anxiety. Disc the risk of increased depression and anxiety but since 3 mos out from surgery OK. DC hydroxyzine  if possible.  11/05/2020 patient called for refill of hydroxyzine  and was taking 12.5 mg.  Dosage reduced to 10 mg.  02/14/2021 phone call from his wife Handing concerned about his tongue movements and his anxiety. MD response: The patient and his wife are elderly.  He seems to forget the purpose of the medication and have some difficulty comprehending the conditions for which he is treated as well as the purpose of the meds and side effect issues. He is also somewhat impatient and anxious wanting changes to be made quickly. The answer to the question is the [name]  is to treat the symptoms of his tongue movements.  If he stops  the [name]  the tongue  movements will be worse.  The [name]  is not causing the tongue movements.  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 He has an appointment with me in 3 weeks.  I do not want to make any med changes over the phone or even med adjustments.  I would urge him to come into the office at that visit.  I do not do not want a televisit.  I want to be able to see him so that I can observe his tongue movements.  03/26/2021 appointment with following noted: Taking [name]  18 mg BID. Claims compliance. Started to feel more fatigued and went to See Dr. Alm Rav 02/25/21 with normal labs including: Comprehensive metabolic panel normal except sodium 132, normal CBC, TSH 2.24, B12 538 normal Fatigue is some better 20%.  Wants to get back in bed after breakfast.  For unclear reasons hydroxyzine  raised again to 25 mg nightly. Some initial insomnia and some awakening.  Nap 1 1/2 hour in AM and another hour in afternoon. Goes to bed 730PM and up 8 AM. Tongue movements persisted until 2 weeks ago and has gotten better. Reduced sertraline  to 50 mg and anxiety is no worse. Not depressed. Reads about sE [name]  and wonders if it is causing fatigue.  Plan: To address fatigue and cognition: Reduce hydroxyzine  again to 10 mg and try to stop it. Stop hydroxyzine  25 mg at night Start hydroxyzine  10 mg tablets 1 at night Start Dayvigo 5 mg tablet 1 at night using samples.  If it helps then stop the hydroxyzine  to help your fatigue and memory Spending way too much time in bed.  03/31/2021 phone call: Dayvigo is not covered until Belsomra  is tried.  Patient was to pick up samples of Belsomra . 04/02/2021 phone call: Complaining of too sleepy on trazodone  50 mg nightly.  Suggested they try half of tablet and if is still a problem stop it. 04/29/2021 phone call with questions and concerns about the [name] .  Wife saying that he had problems with rigidity in his arms and hands. MD response:  The patient and his wife call fairly often with complaints that very between excessive tongue movements associated with tardive dyskinesia and on the alternative side the sort of complaints that could be related to parkinsonian side effects of [name] .  So if he takes too little [name]  his tongue movements will get worse but if he takes too much he may shuffle more than usual.  Even at his baseline before he took [name]  he tended to shuffle his feet and have a reduced arm swing.  He is currently on 12 mg [name]  2 tablets twice daily.  If we reduce it we should reduce it only slightly to 12+9 mg tablets 1 of each twice daily.  My advice is that we not make the any med change until his next appointment.  At his last appointment he stated the tongue movements had only recently gotten better and if we reduce the dose they may get worse again.  06/25/2021 appointment with the following noted: He reduced [name]  on his own to 1 and 1/4 of 9 mg tablets about a week ago bc sleepiness, rigidity of arms and legs and fatigue partly better.  Tongue movements have come back a little. No sleep meds needed.  Tired and lays in bed a lot. Minimal anxiety and depression. Has stopped hydroxyzine . Started OTC TremorSmoothe prn. Wants to come off [name]  bc sx noted.   Always tended to sleep a lot. Occ recurring disturbing  dream. Plan: To address fatigue, sleepiness and cognition: After another week of [name]  9 mg tablets 1 and 1/4 tablets twice daily he can reduce it to 1 tablet twice daily but risk return of TD coming off of this as he wants to do. TD is managed.  09/25/2021 appointment with the following noted: He reduced [name]  from 18 mg BID to 13.5 mg BID and feels it's better with less general tension in his body.  Lately energy better but not right after reducing the med. No worsening of tongue movements which come and go. Depression wife says the weakness he feels is psychological bc likes to sleep a lot.   Not sure how long it's been going on.  To bed 7:30 and gets up about 830.  Walks and then back to bed until about 1130.  Sleeps some.  Chronic tiredness.   Still takes Zoloft  50.   Nervousness is there in his body but not worrying about anything.   pLan no med changes  10/06/2021 phone call asking to increase [name]  to 12 mg twice daily.  That dosage was changed as requested.  10/15/2021 phone call from patient's wife Rock stating bupropion  ER 100 mg every morning made him too hyper and nervous and he stopped it.  10/21/2021 phone call from wife stating with the change in [name]  dose the wife thinks his TD symptoms are worse.  On 12 mg twice daily versus 9 mg tablets 1-1/2 twice daily.  So the 9 mg dose was sent back in.  12/03/2021 appointment with the following noted: Taking [name]  9 mg TID, sertraline  50, no Wellbutrin . Fair.  Tiredness not as pronounced as it was.  Not sure why. Not sure about depression, I have something.  Can't describe it but not worrying a lot.  Has had a number of stressors.  Nerves on edge at times.  Trouble resting but still sleeps a lot.  It comes and goes.  Sleeps 10 hours. Not that productive and doesn't do much.  Enjoyed son and his family for Christmas. TD better not gone.  Worse under stress.   Not going to church DT fear of Covid and history of heart operation. Plan: Increase  sertraline  to 75 mg bc of nerves on edge.    02/09/22 appt noted: Increase apptetite and  wt from 145 to 159#.  In the last couple of months. Less anxiety with nerves less on edge. [name]  workig fair but tongue still moves. Depression better not gone. Energy better but fair. Sleep with benadryl 4 nightly. Plan: continue sertraline  75 and [name]  9 mg TID-QID  TC 03/06/22  4:09 PM Note If his anxiety persists into next week he can increase sertraline  to 100 mg daily for anxiety.     06/08/22 appt noted: Continues sertraline  100 mg working better, [name]  9 mg 4  daily Tolerating meds.  Less SE  now than in the past but slow gait with short steps at times. Thinks tongue movements better with more [name] . Anxiety better controlled.  Depression not too bad. Went to church first time Sunday and that helped.  Had avoided bc Covid fears. Plans to attend person on Sunday mornings. Otherwise health is OK. Son helpful but fixated on rapture. Plan: Continue [name]  9 mg tablets 1 QID and sertraline  100   10/13/22 appt noted: Continues sertraline  100 mg working better, [name]  9 mg 4 daily Fair  with walking as the only real problem.  Sometimes short steps and not much endurance.  Has had  PT in the past. Tongue movmements generally controlled.  When cut back to [name]  TID he complained of more tongue movements. He also tried stopping it all together also. 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  He has not really tried TID recommend trial to see if gait is better.    02/10/23 appt noted: Dropped [name]  to TID and worsening TD including jaw and tongue movementso increased back to QID.  Took a while to notice any difference, but better now. Overall now doing better but ill at ease at times and will call for [facility] almost unconsciously with some anxiety. It comes and goes daily.  There most of the time and can occur without triggers.   Not much depression.  Denies sign avoidance.   Sleep is better lately.  Taking melatonin but sleep is irregular.  Not drowsy in the day.  Some napping.  03/02/23 TC pt complaining of vague feeling discomfort since here. MD resp:  This sounds the same as his complaints at the visit 2/28 Overall now doing better but ill at ease at times and will call for [facility] almost unconsciously with some anxiety. It comes and goes daily. There most of the time and can occur without triggers.    We raised the dose to 150 mg sertraline   at that time.  Give it 2 more weeks bc most of the benefit will probably happen in the next 2 weeks.  If he is not improving by then we will reduce sertraline  to 1 daily.    CMA TC with wife: Called wife to let her know recommendations and she had not increased dose from 100 to 150 mg. She thought one tablet was 150 mg.  I told her to cut pill in half and give 1 whole pill and 1 half pill. Asked that she let us  know if 2 weeks how he was doing, sooner if needed.       05/12/23 appt noted:  seen in office Meds : incr sertraline  to 150 mg mid March Better since of well being since increase sertaline.   No SE noted. More generally anxious without reason when goes to bed.  Sleep 10 to 8.   Feels he needs current dose Austed 9 mg QID bc uncontrollable tongue movements with less.  They are manageable with this dose. No par.  No fear.  Satisfied with current meds.  09/15/23 appt noted: Meds: sertraline  150, [name]  9 QID SE slow gait Been fair overall.  Anxiety is a little better.  Not dep. Not much trouble with tongue movments anymore. Plan: reduce [name]  by switching to XR 30 mg  to see if gait is freer.  11/16/23 appt noted: 2-3 weeks after switch movements worsened but able to stay on [name]  XR 30 mg daily. Tongue movements not gone but wax and wane and manageable. Feels looser in legs and gait better with less [name]  but took until recently to get better.   Thinks hands stiffer from [name]  also. Wants to try reducing [name]  again if possible. Meds: sertraline  150, [name]  XR 30 mg daily. Plan: Per his request reduce [name]  by switching to XR 24 mg to see if gait is freer.  11/22/23 TC: Rock called in to report that Jessiah took the increased dose of [name]  and it is just not working. His tongue and jaw are moving too much and its uncomfortable and he cannot  sleep or rest. Should they reduce back down to 1 pll per day?  MD resp:  To be clear, we did not increase the dose of the  [name] .  We reduced the dose of the [name]  from XR 30 mg XR 24 mg per his request.  He was aware that this could lead to worsening of his tongue movements which is apparently what happened.  He may have some 30 mg tablets left and if so he should use them.  If needed please send an [name]  1 month prescription of the XR 30 mg tablets into the appropriate pharmacy.     01/18/24 appt noted: Psych med: [name]  XR 30 mg , sertraline  50 mg 3 daily. Did notice easier gait and less stiffness with less [name]  XR 24 mg daily but tongue movements got worse and went back up in the dose. XR 30 mg daily worked awhile and then more tongue and jaw movements.  So then started cutting 9 mg [name]  into quarters and then started taking 9 mg 1/4 QID in addition to XR 30 mg and helped the tongue movements.  Took awhile to help.  Made his gait a little stiffer but not a whole lot worse.  Times in afternoon when feels ill at ease and that will make tongue movements worse.  Feeling in his body and mind.  Not sure it's anxiety.  Will get real dependent when it happens.   Will call out to her.  Can happen often at night if can't go to sleep. No fearful thoughts.  Maybe some dep at times.  And some anxiety at times.    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  Son Oneil living with them.  Become more committed Christian.  He has schizophrenia.   Past Psychiatric Medication Trials: Mirtazapine ,  olanzapine  5 EPS, perphenazine  started TD with mouth px,  Saphris 5 mg and stopped due to EPS complaints and feeling funny Caplyta side effects risperidone 2 tremors  Buspirone ,   amitriptyline 25,  sertraline  50 Wellbutrin  SR 100 nervous Hydroxyzine  10-20 Clonazepam  0.5 TID dizzy and sleepy Ativan  0.5 mg QID Ingrezza    [name]  18 mg BID or XR 30  Review of Systems:  Review of Systems  Constitutional:  Negative for appetite change and fatigue.  Cardiovascular:  Negative for palpitations.  Gastrointestinal:   Negative for constipation.  Musculoskeletal:  Positive for gait problem.  Neurological:  Negative for dizziness, tremors and syncope.       No falls.  Slow gait.   Psychiatric/Behavioral:  Negative for agitation, behavioral problems, confusion, decreased concentration, dysphoric mood, hallucinations, self-injury and sleep disturbance. The patient is nervous/anxious. The patient is not hyperactive.        Sleepy  CABG 07/19/20  Medications: I have reviewed the patient's current medications.  Current Outpatient Medications  Medication Sig Dispense Refill   acetaminophen  (TYLENOL ) 500 MG tablet Take 500 mg by mouth daily.     Bempedoic Acid-Ezetimibe (NEXLIZET ) 180-10 MG TABS Take 1 tablet by mouth daily. 30 tablet 11   clopidogrel  (PLAVIX ) 75 MG tablet TAKE 1 TABLET BY MOUTH EVERY DAY 90 tablet 3   Deutetrabenazine  ([name] ) 9 MG TABS Take 0.25 tablets by mouth in the morning, at noon, in the evening, and at bedtime. In addition to XR 30 mg daily.     Deutetrabenazine  ER ([name]  XR) 36 MG TB24 Take 1 capsule by mouth daily. 30 tablet 2   MELATONIN PO Take 1 tablet by mouth at bedtime.  metoprolol  succinate (TOPROL -XL) 25 MG 24 hr tablet TAKE 1 TABLET (25 MG TOTAL) BY MOUTH DAILY. 90 tablet 2   Multiple Vitamin (MULTIVITAMIN WITH MINERALS) TABS tablet Take 2 tablets by mouth daily. 50 +     sennosides-docusate sodium  (SENOKOT-S) 8.6-50 MG tablet Take 1 tablet by mouth daily with supper.     sertraline  (ZOLOFT ) 50 MG tablet Take 3 tablets (150 mg total) by mouth daily. 270 tablet 1   silodosin (RAPAFLO) 4 MG CAPS capsule Take 4 mg by mouth daily with breakfast.     VITAMIN D PO Take 1 capsule by mouth daily.     No current facility-administered medications for this visit.    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   LIMA to LAD, Sequential SVG to OM1-OM3, SVG to RCA, EVH via left thigh   Schizophrenia (HCC)    Severe aortic stenosis     Family History  Problem Relation Age of Onset   Hypertension Mother    Heart attack Father    Hypertension Sister    Hypertension Brother     Social History   Socioeconomic History   Marital status: Married    Spouse name: Not on file   Number of children: Not on file   Years of education: Not on file   Highest education level: Not on file  Occupational History   Not on file  Tobacco Use   Smoking status: Never   Smokeless tobacco: Never  Vaping Use   Vaping status: Never Used  Substance and Sexual Activity   Alcohol use: No   Drug use: Never   Sexual activity: Not on file  Other Topics Concern   Not on file  Social History Narrative   Not on file   Social Drivers of Health   Financial Resource Strain: Not on file  Food Insecurity: Not on file  Transportation Needs: Not on file  Physical Activity: Not on file  Stress: Not on file  Social Connections: Not on file  Intimate Partner Violence: Not on file    Past Medical History, Surgical history, Social history, and Family history were reviewed and updated as appropriate.   Please see review of systems for further details on the patient's review from today.   Objective:   Physical Exam:  There were no vitals taken for this visit.  Physical Exam HENT:     Right Ear: Decreased hearing noted.  Neurological:      Mental Status: He is alert and oriented to person, place, and time.     Cranial Nerves: No dysarthria.     Gait: Gait abnormal.     Comments: Reduced arm swing and shortened gait maybe worse Mild tongue rolling is a little worse Min incr motor tone UE bilat  Psychiatric:        Attention and Perception: Attention and perception normal.        Mood and Affect: Mood is anxious. Mood is not depressed.        Speech: Speech normal.        Behavior: Behavior is cooperative.        Thought Content: Thought content normal. Thought content is not paranoid or delusional. Thought content does not include homicidal or suicidal ideation. Thought content does not include suicidal plan.        Cognition and Memory: Cognition and memory normal.        Judgment: Judgment normal.     Comments: Insight intact     Lab Review:     Component Value Date/Time   NA 132 (L) 07/24/2020 0400   NA 130 (L) 07/09/2020 1508   K 3.4 (L) 07/24/2020 0400   CL 96 (L) 07/24/2020 0400   CO2 24 07/24/2020 0400   GLUCOSE 115 (H) 07/24/2020 0400   BUN 20 07/24/2020 0400   BUN 16 07/09/2020 1508   CREATININE 0.74 07/24/2020 0400   CALCIUM  7.6 (L) 07/24/2020 0400   PROT 6.6 07/17/2020 0920   PROT 6.6 07/09/2020 1508   ALBUMIN  3.8 07/17/2020 0920   ALBUMIN  4.4 07/09/2020 1508   AST 18 07/17/2020 0920   ALT 22 07/17/2020 0920   ALKPHOS 54 07/17/2020 0920   BILITOT 0.7 07/17/2020 0920   BILITOT 0.4 07/09/2020 1508   GFRNONAA >60 07/24/2020 0400   GFRAA >60 07/24/2020 0400       Component Value Date/Time   WBC 14.0 (H) 07/23/2020 0645   RBC 3.78 (L) 07/23/2020 0645   HGB 11.9 (L) 07/23/2020 0645   HGB 13.5 06/18/2020 1056   HCT 35.1 (L) 07/23/2020 0645   HCT 38.6 06/18/2020 1056   PLT 136 (L) 07/23/2020 0645   PLT 243 06/18/2020 1056   MCV 92.9 07/23/2020 0645   MCV 90 06/18/2020 1056   MCH 31.5 07/23/2020 0645   MCHC 33.9 07/23/2020 0645   RDW 13.7 07/23/2020 0645   RDW 13.0 06/18/2020 1056    LYMPHSABS 1.7 06/18/2020 1056   EOSABS 0.4 06/18/2020 1056   BASOSABS 0.0 06/18/2020 1056   I will  No results found for: POCLITH, LITHIUM   No results found for: PHENYTOIN, PHENOBARB, VALPROATE, CBMZ   AIMS    Flowsheet Row Office Visit from 06/04/2020 in [facility]  AIMS Total Score 22        .res Assessment: Plan:    Ryzen was seen today for follow-up, depression, anxiety and medication reaction.  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  Fatigue, unspecified type  Insomnia due to mental condition       35 min non-face to face time with patient was spent on counseling and coordination of care. We discussed the following.   We discussed Patient has history of severe recurrent major depression with psychotic features.  There are also elements of OCD.   His paranoid is improved.    He restarted Benadryl. Cautioned about it.  Taking melatonin.  Disc sleep hygiene.  His son has schizophrenia so there may be a genetic predisposition to psychosis.  I had suspected  that he has paranoid schizophrenia that  has been previously diagnosed as depression with psychosis but in either case his sx remitted without currrent use of antipsychotic.  Both son and pt have history of slowness of gait, reduced arm swing , short steps that predate any history of antipsychotic.  This is ongoing and ? Worse with [name] .  He does not have concerned.  He has benefited with reduction in anxiety from the use of sertraline  . Still has frequent anxiey daily abut is better Again his depression is better with the sertraline .  Less shame but that may be mild paranoia or anxiety and not depression. Better with increase sertraline   to 150 mg bc anxiety     Disc SE in detail and SSRI withdrawal sx. Call if a problem  Discussed his tendency to change his medicines on his own.  There is significant risk in doing so.    Gingko biloba 120 mg twice daily.  Make sure the dose of what you buy is correct as it can help [name]  to work better and should not cause side effects. Change dosing [name]  XR to 36 mg daily and stop IR versions.  TD is fairly manageable but not gone.  Mildy Parkinsonian..  FU 2 mos   Lorene Macintosh, MD, DFAPA     Future Appointments  Date Time Provider Department Center  04/11/2024  1:40 PM Hilty, Vinie BROCKS, MD [facility] None        No orders of the defined types were placed in this encounter.      -------------------------------

## 2024-01-18 NOTE — Patient Instructions (Addendum)
 Gingko biloba 120 mg twice daily.  Make sure the dose of what you buy is correct as it can help [name]  to work better and should not cause side effects.  This may take 2-3 months to help much.  Increase [name]  XR to 36 mg daily and stop the other [name]  for now.

## 2024-01-23 ENCOUNTER — Other Ambulatory Visit: Payer: Self-pay | Admitting: Psychiatry

## 2024-01-23 DIAGNOSIS — F061 Catatonic disorder due to known physiological condition: Secondary | ICD-10-CM

## 2024-01-23 DIAGNOSIS — F411 Generalized anxiety disorder: Secondary | ICD-10-CM

## 2024-01-26 ENCOUNTER — Other Ambulatory Visit: Payer: Self-pay | Admitting: Psychiatry

## 2024-01-26 DIAGNOSIS — F411 Generalized anxiety disorder: Secondary | ICD-10-CM

## 2024-01-26 DIAGNOSIS — F331 Major depressive disorder, recurrent, moderate: Secondary | ICD-10-CM

## 2024-01-26 DIAGNOSIS — F251 Schizoaffective disorder, depressive type: Secondary | ICD-10-CM

## 2024-03-05 DIAGNOSIS — S3992XA Unspecified injury of lower back, initial encounter: Secondary | ICD-10-CM | POA: Diagnosis not present

## 2024-03-13 ENCOUNTER — Other Ambulatory Visit: Payer: Self-pay | Admitting: Psychiatry

## 2024-03-13 DIAGNOSIS — F411 Generalized anxiety disorder: Secondary | ICD-10-CM

## 2024-03-13 DIAGNOSIS — F331 Major depressive disorder, recurrent, moderate: Secondary | ICD-10-CM

## 2024-03-13 DIAGNOSIS — F061 Catatonic disorder due to known physiological condition: Secondary | ICD-10-CM

## 2024-03-22 ENCOUNTER — Encounter: Payer: Self-pay | Admitting: Psychiatry

## 2024-03-22 ENCOUNTER — Ambulatory Visit: Payer: Medicare HMO | Admitting: Psychiatry

## 2024-03-22 DIAGNOSIS — F061 Catatonic disorder due to known physiological condition: Secondary | ICD-10-CM | POA: Diagnosis not present

## 2024-03-22 DIAGNOSIS — G2119 Other drug induced secondary parkinsonism: Secondary | ICD-10-CM | POA: Diagnosis not present

## 2024-03-22 DIAGNOSIS — F5105 Insomnia due to other mental disorder: Secondary | ICD-10-CM

## 2024-03-22 DIAGNOSIS — F251 Schizoaffective disorder, depressive type: Secondary | ICD-10-CM

## 2024-03-22 DIAGNOSIS — R5383 Other fatigue: Secondary | ICD-10-CM

## 2024-03-22 DIAGNOSIS — F411 Generalized anxiety disorder: Secondary | ICD-10-CM | POA: Diagnosis not present

## 2024-03-22 DIAGNOSIS — G2401 Drug induced subacute dyskinesia: Secondary | ICD-10-CM | POA: Diagnosis not present

## 2024-03-22 NOTE — Progress Notes (Signed)
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     VITAMIN D PO Take 1 capsule by mouth daily.     No current facility-administered medications for this visit.    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   LIMA to LAD, Sequential SVG to OM1-OM3, SVG to RCA, EVH via left thigh   Schizophrenia (HCC)    Severe aortic stenosis     Family History  Problem Relation Age of Onset   Hypertension Mother    Heart attack Father    Hypertension Sister    Hypertension Brother     Social History   Socioeconomic  History   Marital status: Married    Spouse name: Not on file   Number of children: Not on file   Years of education: Not on file   Highest education level: Not on file  Occupational History   Not on file  Tobacco Use   Smoking status: Never   Smokeless tobacco: Never  Vaping Use   Vaping status: Never Used  Substance and Sexual Activity   Alcohol use: No  Drug use: Never   Sexual activity: Not on file  Other Topics Concern   Not on file  Social History Narrative   Not on file   Social Drivers of Health   Financial Resource Strain: Not on file  Food Insecurity: Not on file  Transportation Needs: Not on file  Physical Activity: Not on file  Stress: Not on file  Social Connections: Not on file  Intimate Partner Violence: Not on file    Past Medical History, Surgical history, Social history, and Family history were reviewed and updated as appropriate.   Please see review of systems for further details on the patient's review from today.   Objective:   Physical Exam:  There were no vitals taken for this visit.  Physical Exam HENT:     Right Ear: Decreased hearing noted.  Neurological:     Mental Status: He is alert and oriented to person, place, and time.     Cranial Nerves: No dysarthria.  Psychiatric:        Attention and Perception: Attention and perception normal.        Mood and Affect: Mood is anxious. Mood is not depressed.        Speech: Speech normal.        Behavior: Behavior is cooperative.        Thought Content: Thought content normal. Thought content is not paranoid or delusional. Thought content does not include homicidal or suicidal ideation. Thought content does not include suicidal plan.        Cognition and Memory: Cognition and memory normal.        Judgment: Judgment normal.     Comments: Insight intact     Lab Review:     Component Value Date/Time   NA 132 (L) 07/24/2020 0400   NA 130 (L) 07/09/2020 1508   K 3.4 (L) 07/24/2020 0400    CL 96 (L) 07/24/2020 0400   CO2 24 07/24/2020 0400   GLUCOSE 115 (H) 07/24/2020 0400   BUN 20 07/24/2020 0400   BUN 16 07/09/2020 1508   CREATININE 0.74 07/24/2020 0400   CALCIUM 7.6 (L) 07/24/2020 0400   PROT 6.6 07/17/2020 0920   PROT 6.6 07/09/2020 1508   ALBUMIN 3.8 07/17/2020 0920   ALBUMIN 4.4 07/09/2020 1508   AST 18 07/17/2020 0920   ALT 22 07/17/2020 0920   ALKPHOS 54 07/17/2020 0920   BILITOT 0.7 07/17/2020 0920   BILITOT 0.4 07/09/2020 1508   GFRNONAA >60 07/24/2020 0400   GFRAA >60 07/24/2020 0400       Component Value Date/Time   WBC 14.0 (H) 07/23/2020 0645   RBC 3.78 (L) 07/23/2020 0645   HGB 11.9 (L) 07/23/2020 0645   HGB 13.5 06/18/2020 1056   HCT 35.1 (L) 07/23/2020 0645   HCT 38.6 06/18/2020 1056   PLT 136 (L) 07/23/2020 0645   PLT 243 06/18/2020 1056   MCV 92.9 07/23/2020 0645   MCV 90 06/18/2020 1056   MCH 31.5 07/23/2020 0645   MCHC 33.9 07/23/2020 0645   RDW 13.7 07/23/2020 0645   RDW 13.0 06/18/2020 1056   LYMPHSABS 1.7 06/18/2020 1056   EOSABS 0.4 06/18/2020 1056   BASOSABS 0.0 06/18/2020 1056   I will  No results found for: "POCLITH", "LITHIUM"   No results found for: "PHENYTOIN", "PHENOBARB", "VALPROATE", "CBMZ"   AIMS    Flowsheet Row Office Visit from 06/04/2020 in [facility]  AIMS Total Score 22        .  res Assessment: Plan:    Phinehas was seen today for follow-up, depression, anxiety, medication reaction and other.  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 We discussed the following.   We discussed Patient has history of  severe recurrent major depression with psychotic features.  There are also elements of OCD.   His paranoid is improved.    He restarted Benadryl. Cautioned about it.  Taking melatonin.  Disc sleep hygiene.  His son has schizophrenia so there may be a genetic predisposition to psychosis.  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  Both son and pt have history of slowness of gait, reduced arm swing , short steps that predate any history of antipsychotic.  This is ongoing and ? Worse with [name].  He does not have concerned.  He has benefited with reduction in anxiety from the use of sertraline . Still has frequent anxiey daily abut is better Again his depression is better with the sertraline.  Less shame but that may be mild paranoia or anxiety and not depression. Better with increase sertraline to 150 mg bc anxiety     Disc SE in detail and SSRI withdrawal sx. Call if a problem  Discussed his tendency to change his medicines on his own.  There is significant risk in doing so.    Gingko biloba 120 mg twice daily.  Make sure the dose of what you buy is correct as it can help [name] to work better and should not cause side effects. Continue [name] XR to 36 mg daily and stop IR versions. No med changes  TD is fairly manageable but not gone.  Mildy Parkinsonian..  FU 2 mos   Meredith Staggers, MD, DFAPA     Future Appointments  Date Time Provider Department Center  04/11/2024  1:40 PM Hilty, Lisette Abu, MD [facility] None        No orders of the defined types were placed in this encounter.      -------------------------------

## 2024-03-23 ENCOUNTER — Ambulatory Visit: Payer: Medicare HMO | Admitting: Psychiatry

## 2024-03-28 ENCOUNTER — Other Ambulatory Visit: Payer: Self-pay | Admitting: Psychiatry

## 2024-03-28 DIAGNOSIS — G2401 Drug induced subacute dyskinesia: Secondary | ICD-10-CM

## 2024-04-04 ENCOUNTER — Telehealth: Payer: Self-pay | Admitting: Internal Medicine

## 2024-04-04 NOTE — Telephone Encounter (Signed)
 Pt spouse called in stating she is not able to bring pt for labs and then for the appt. She asked if he can have is labs drawn and change his lipid f/u 4/29 to virtual. Please advise.

## 2024-04-04 NOTE — Telephone Encounter (Signed)
 I will c/b to r/s Neil [name], thank you!

## 2024-04-11 ENCOUNTER — Ambulatory Visit: Payer: Medicare HMO | Admitting: Internal Medicine

## 2024-04-19 DIAGNOSIS — R3915 Urgency of urination: Secondary | ICD-10-CM | POA: Diagnosis not present

## 2024-04-19 DIAGNOSIS — R351 Nocturia: Secondary | ICD-10-CM | POA: Diagnosis not present

## 2024-04-19 DIAGNOSIS — N401 Enlarged prostate with lower urinary tract symptoms: Secondary | ICD-10-CM | POA: Diagnosis not present

## 2024-04-19 DIAGNOSIS — R3914 Feeling of incomplete bladder emptying: Secondary | ICD-10-CM | POA: Diagnosis not present

## 2024-04-25 NOTE — Telephone Encounter (Signed)
 OLD MESSAGE- Completed

## 2024-05-23 ENCOUNTER — Telehealth: Payer: Self-pay | Admitting: Psychiatry

## 2024-05-23 NOTE — Telephone Encounter (Signed)
 LVM to [facility]

## 2024-05-23 NOTE — Telephone Encounter (Signed)
 I called pt to r/s an appt.  His wife answered the phone.  She is on the [facility].  She would like a call back.  She said pt is restless and she would like to know if there's something she can do between now and his next appt.   Next appt 8/18

## 2024-05-24 NOTE — Telephone Encounter (Signed)
 Wife reports patient is restless and wants her with him at all times. She asked me to talk to him and he describes not having a feeling of well-being and needing frequent reassurance. Wife reported they recently had their roof replaced and that made him anxious, but no other changes.    Better with increase sertraline  to 150 mg bc anxiety     Disc SE in detail and SSRI withdrawal sx. Call if a problem   Discussed his tendency to change his medicines on his own.  There is significant risk in doing so.     Gingko biloba 120 mg twice daily.  Make sure the dose of what you buy is correct as it can help [name]  to work better and should not cause side effects. Continue [name]  XR to 36 mg daily and stop IR versions. No med changes

## 2024-05-24 NOTE — Telephone Encounter (Signed)
 Left second VM to RC.

## 2024-05-24 NOTE — Telephone Encounter (Signed)
 BC of his age and unsteadiness it is preferable to avoid sedatives; so the safest option is to increase sertraline  to 200 mg daily.  Make sure he's not missing doses.

## 2024-05-25 NOTE — Telephone Encounter (Signed)
 Pt and wife informed of the recommendation to increase the sertraline  from 150 mg to 200 mg. Pt is taking 50 mg tablets. Wife asked if he could spread out dose rather than taking all at once.  After asking Traci to be sure, told he could take 2 QAM and 2 QPM.

## 2024-06-09 ENCOUNTER — Telehealth: Payer: Self-pay | Admitting: Internal Medicine

## 2024-06-09 NOTE — Telephone Encounter (Signed)
 Spoke with Rock per DPR. Rock stated her son has a procedure the same day as his appointment with Dr Mona. Asked if we could move appointment. We moved appointment to after his appointment with PCP so that he could get labs with them and have them ready for his appointment.

## 2024-06-09 NOTE — Telephone Encounter (Signed)
 Pt's wife Rock is requesting a callback regarding pt's upcoming appt before making any changes. Please advise

## 2024-06-26 ENCOUNTER — Ambulatory Visit: Admitting: Psychiatry

## 2024-07-05 ENCOUNTER — Encounter: Admitting: Internal Medicine

## 2024-07-14 ENCOUNTER — Other Ambulatory Visit: Payer: Self-pay | Admitting: Psychiatry

## 2024-07-14 DIAGNOSIS — G2401 Drug induced subacute dyskinesia: Secondary | ICD-10-CM

## 2024-07-17 ENCOUNTER — Telehealth: Payer: Self-pay | Admitting: Psychiatry

## 2024-07-17 DIAGNOSIS — F411 Generalized anxiety disorder: Secondary | ICD-10-CM

## 2024-07-17 DIAGNOSIS — F061 Catatonic disorder due to known physiological condition: Secondary | ICD-10-CM

## 2024-07-17 MED ORDER — SERTRALINE HCL 100 MG PO TABS: 200.0000 mg | ORAL_TABLET | Freq: Every day | ORAL | 0 refills | Status: DC

## 2024-07-17 NOTE — Telephone Encounter (Signed)
[id number] [place], [place] [facility] [address] [place] [address] Phone: [phone]  Fax: [phone]

## 2024-07-17 NOTE — Telephone Encounter (Signed)
 Rx sent for 200 mg

## 2024-07-17 NOTE — Telephone Encounter (Signed)
 Pt's wife called at 9:20a.  She is on D[facility].  She is requesting refill of Sertraline .  She said Dr Geoffry advised pt he could take either 3 or 4 pills a day, therefore he needs refill now.  Send script to

## 2024-07-31 ENCOUNTER — Encounter: Payer: Self-pay | Admitting: Psychiatry

## 2024-07-31 ENCOUNTER — Ambulatory Visit (INDEPENDENT_AMBULATORY_CARE_PROVIDER_SITE_OTHER): Admitting: Psychiatry

## 2024-07-31 DIAGNOSIS — F251 Schizoaffective disorder, depressive type: Secondary | ICD-10-CM | POA: Diagnosis not present

## 2024-07-31 DIAGNOSIS — F411 Generalized anxiety disorder: Secondary | ICD-10-CM | POA: Diagnosis not present

## 2024-07-31 DIAGNOSIS — F061 Catatonic disorder due to known physiological condition: Secondary | ICD-10-CM | POA: Diagnosis not present

## 2024-07-31 DIAGNOSIS — R5383 Other fatigue: Secondary | ICD-10-CM

## 2024-07-31 DIAGNOSIS — G2401 Drug induced subacute dyskinesia: Secondary | ICD-10-CM | POA: Diagnosis not present

## 2024-07-31 DIAGNOSIS — F5105 Insomnia due to other mental disorder: Secondary | ICD-10-CM | POA: Diagnosis not present

## 2024-07-31 MED ORDER — AUSTEDO XR 36 MG PO TB24: 1.0000 | ORAL_TABLET | Freq: Every day | ORAL | 5 refills | Status: AC

## 2024-07-31 MED ORDER — SERTRALINE HCL 100 MG PO TABS: 200.0000 mg | ORAL_TABLET | Freq: Every day | ORAL | 1 refills | Status: AC

## 2024-07-31 NOTE — Progress Notes (Signed)
 Neil [name] [id number] Jan 28, 1941 83 y.o.     Subjective:   Patient ID:  Neil [name] is a 83 y.o. (DOB [birth year]) male.  Chief Complaint:  Chief Complaint  Patient presents with   Follow-up   Depression   Anxiety   Medication Reaction    Neil [name] presents today for follow-up of psychosis, depression, and anxiety.  He has required an urgent appointment because of wanting further med changes and frequent phone calls with med changes since he was last here  When seen September 3 he was switched off olanzapine  because of EPS and switched on to Saphris which was raised to 5 mg nightly but only took that dose 1 time.  The rest of the time he took only 2.5 mg nightly.   He called back 5 days later stating he wanted to stop it because it was causing him to hold his mouth open and he was having a worsening time with tongue movements and felt funny.  He was encouraged to give it more time but he would not agree to do so.  He stopped it Friday 9/11 and still felt the funny tense feeling.    When  seen September 01, 2019.  The decision was made to switch from Ingrezza  to [name]  because of difficulty getting an adequate response and limited side effects from Ingrezza .  We also discussed the antipsychotic and the plan was to start Caplyta but those samples were not available so he decided to restart Saphris 2.5 mg nightly.  At the visit September 15, 2019 [name]  was increased to 12 mg twice daily, Saphris was discontinued and Caplyta 42 mg daily was started because of his low EPS risk.  He still needed an antipsychotic.  He continued on sertraline  75 mg daily because he felt it helped his depression.  Wife called 10/8 and said the following: On Caplyta,  Thinks it is too strong.  His movements are slow, sleeping too much, drowsy during day ( doesn't really wake up until 4pm after sleeping all night). Takes him along time to think and respond. Had trouble with mouth and jaw before, and  now he is holding his mouth open all the time.  Paranoia is better though. Was told to take it 3 days weekly.    October 16 [name]  increased to 18 mg BID  Called again November 9 stating he couldn't tolerate either med.  He stopped the Caplyta and reduced the [name]  to 9 mg BID a few days ago.  CO rigidity in lower back and arms.  Still bothered by constant tongue movements.  No change in worry, mood but paranoia is reduced since Caplyta.   visit October 25, 2019.  He had stopped Caplyta.  He had also reduced [name]  to 9 mg twice daily on his own.  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  He is very impatient with med changes.  Therefore we did not start a new antipsychotic at the last visit.  He tends to make frequent phone calls and did call again on November 23 complaining of feeling very restless and difficulty sleeping.  Was prescribed clonazepam  0.5 mg twice daily.  He called back the next day stating he wanted to stop the [name]  due to restlessness but he had never taken the prescribed clonazepam .  Using  courage to follow the treatment plan and take the clonazepam .  He called again the next day stating he still wanted to stop [name]  and the clonazepam  was making his gait unsteady.  He was informed that if he stopped the [name]  his mouth movements would get worse which is been a leading complaint but he could stop it if he wanted to.  December 4 he reports clonazepam  has helped the anxiety and less guilt and less rigidity.  Taking TID and only SE is a little slower movements.  1 fall, slipped on Ba floor where water was lying.  No injury.   Gained 3# and better appetite for the first time in a long time.  CO tongue movements.  Restlessness is now bearable.  Less anxiety overall.  No AH.    visit  December 4.  His psychosis was not severe and the decision was made not to start another antipsychotic but instead to try to optimize treatment for tardive dyskinesia and [name]  was increased to 12 mg twice daily. He called back immediately after the head visit admitting he had given false information during the visit and that he not actually been taking the [name]  in the way he told me during the visit making it necessary to change the [name]  to 9 mg twice daily.  He was confronted about his noncompliance and warned that he may be discharged from the practice if he continued to be misleading and noncompliant with the physician.  This is not only compromising care but it is increasing his own risk because he is giving false information.   visit December 21, 2019.  Because of his complaints about balance issues clonazepam  was reduced and spread out to 0.25 mg 4 times daily.  Also for tardive dyskinesia symptoms [name]  was increased to 12 mg twice daily.  Prior to that visit he admitted being generally noncompliant with the [name]  making it difficult to judge what dose to prescribe.  He was continued on sertraline  75 mg daily as he felt that was helpful for his anxiety. Says he's compliant with med changes last visit.  Tongue movements are better but not gone. No problems with the [name] .  Stiffness comes and goes and located in head and eyes.  Denies it's vertigo.  Intensity is less.  If shuts his eyes it goes right away.  visit January 04, 2020.  [name]  remained 12 mg twice daily and sertraline  75 mg daily.  The following was changed: switch to lorazepam  bc tiredness and balance problems to Ativan . Yes DC clonazepam  and start lorazepam  0.5 mg 4 times daily  seen February 8 the patient reports the following.  Yes it did help.  Not as slowed and balance is good now.  Still feels tired with tendency to want to lay down and knows he needs to work on this.  Anxiety is pretty fair.  We decided to reduce the  lorazepam  to 0.5 mg 3 times daily in hopes of improving energy and balance further.  No other meds were changed.  He is still not on an antipsychotic.  Anxiety comes and goes but sometimes able to get by with TID lorazepam  and energy is better.  Wife says sometimes gets uptight and can't relax so needs QID.Pt reports that mood is anxiety aand not much depression and describes anxiety as worry. Anxiety symptoms include: worry not panic and moderate.   Sleep is better usually with hydroxyzine  and sometimes lorazepam . Pt reports that appetite is Much better with weight up  to 131# as low as 120#.  Normal was 140#.Neil [name] Pt reports that energy is better and OK. Concentration is fair.. Suicidal thoughts:  None. Denies sig paranoid thought.  Plan: No med change except okay to use hydroxyzine  25 mg nightly for insomnia.  Per usual the patient tends to make multiple phone calls between appointments.  He is called about 7 times since March.  06/04/2020 appointment with the following noted: Seen with his wife. Reports hydroxyzine  25 mg is not helpful for sleep. Still moving tongue. No SE meds currently. Not depressed.  Is anxious persistent in the back ground and not even connected to thoughts. Little intrusive thougts.   Sleep concerns, to bed 830 an not sure when goes to sleep.  Sometimes can't go to sleep. Get OOB about 9 AM.  Usually goes  back to bed.  CO weakness for 18 mos. Needs Aortic valve replacement. He tried reducing lorazepam  to 0.5 mg TID and had a lot of restlessness but some improvement in energy.  Wonders if fatigue related to meds or heart valve problem.  Probably will have heart surgery. Plan: Try to reduce lorazepam  to 3 and 1/2 tablets daily.  to see if energy is better.  Keep the lowest effective dose possible.   Do not exceed 4 Ativan  a day. No new antipsychotics started today. Increase [name]  for 1 week to 1 of 6 mg and 1 of the 9 mg tablets twice daily for 1 week, Then increase again to  12 mg tablets 1 and 1/2 tablets twice daily or 18 mg twice daily. Continue sertraline  75 mg daily for anxiety.  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  Recovering slowly.  Just started walking a week or 2 ago. Neil [name] Handing handles the meds. [name]  doesn't control tongue movements but it helps. Done fine without lorazepam  and less drowsy per wife. Taking hydroxyzine  25 mg for sleep with Tylenol .  Sleeping well per wife.   Wife thinks overall he's doing well from mental health perspective and seems calmer.  Pt says the psychosis has lightened up and he's grateful for that.  No depression.   Wants to try to stop hydroxyzine . Wants to try to reduce sertraline  to 50 mg to prevent cutting.   Plan: He wants to reduce sertraline  to 50 daily.  He says he's less depressed and it has helped his anxiety. Disc the risk of increased depression and anxiety but since 3 mos out from surgery OK. DC hydroxyzine  if possible.  11/05/2020 patient called for refill of hydroxyzine  and was taking 12.5 mg.  Dosage reduced to 10 mg.  02/14/2021 phone call from his wife Handing concerned about his tongue movements and his anxiety. MD response: The patient and his wife are elderly.  He seems to forget the purpose of the medication and have some difficulty comprehending the conditions for which he is treated as well as the purpose of the meds and side effect issues. He is also somewhat impatient and anxious wanting changes to be made quickly. The answer to the question is the [name]  is to treat the symptoms of his tongue movements.  If he stops the [name]  the tongue movements will be worse.  The [name]  is not causing the tongue movements.  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 He has an appointment with me in 3 weeks.  I do not want to make any med changes over the phone or even med adjustments.  I would urge him to come  into the office at that visit.  I do not do not want a  televisit.  I want to be able to see him so that I can observe his tongue movements.  03/26/2021 appointment with following noted: Taking [name]  18 mg BID. Claims compliance. Started to feel more fatigued and went to See Dr. Alm Rav 02/25/21 with normal labs including: Comprehensive metabolic panel normal except sodium 132, normal CBC, TSH 2.24, B12 538 normal Fatigue is some better 20%.  Wants to get back in bed after breakfast.  For unclear reasons hydroxyzine  raised again to 25 mg nightly. Some initial insomnia and some awakening.  Nap 1 1/2 hour in AM and another hour in afternoon. Goes to bed 730PM and up 8 AM. Tongue movements persisted until 2 weeks ago and has gotten better. Reduced sertraline  to 50 mg and anxiety is no worse. Not depressed. Reads about sE [name]  and wonders if it is causing fatigue.  Plan: To address fatigue and cognition: Reduce hydroxyzine  again to 10 mg and try to stop it. Stop hydroxyzine  25 mg at night Start hydroxyzine  10 mg tablets 1 at night Start Dayvigo 5 mg tablet 1 at night using samples.  If it helps then stop the hydroxyzine  to help your fatigue and memory Spending way too much time in bed.  03/31/2021 phone call: Dayvigo is not covered until Belsomra  is tried.  Patient was to pick up samples of Belsomra . 04/02/2021 phone call: Complaining of too sleepy on trazodone  50 mg nightly.  Suggested they try half of tablet and if is still a problem stop it. 04/29/2021 phone call with questions and concerns about the [name] .  Wife saying that he had problems with rigidity in his arms and hands. MD response: The patient and his wife call fairly often with complaints that very between excessive tongue movements associated with tardive dyskinesia and on the alternative side the sort of complaints that could be related to parkinsonian side effects of [name] .  So if he takes too little [name]  his tongue movements will get worse but if he takes too much he may  shuffle more than usual.  Even at his baseline before he took [name]  he tended to shuffle his feet and have a reduced arm swing.  He is currently on 12 mg [name]  2 tablets twice daily.  If we reduce it we should reduce it only slightly to 12+9 mg tablets 1 of each twice daily.  My advice is that we not make the any med change until his next appointment.  At his last appointment he stated the tongue movements had only recently gotten better and if we reduce the dose they may get worse again.  06/25/2021 appointment with the following noted: He reduced [name]  on his own to 1 and 1/4 of 9 mg tablets about a week ago bc sleepiness, rigidity of arms and legs and fatigue partly better.  Tongue movements have come back a little. No sleep meds needed.  Tired and lays in bed a lot. Minimal anxiety and depression. Has stopped hydroxyzine . Started OTC TremorSmoothe prn. Wants to come off [name]  bc sx noted.   Always tended to sleep a lot. Occ recurring disturbing dream. Plan: To address fatigue, sleepiness and cognition: After another week of [name]  9 mg tablets 1 and 1/4 tablets twice daily he can reduce it to 1 tablet twice daily but risk return of TD coming off of this as he wants to do. TD is managed.  09/25/2021 appointment with the following  noted: He reduced [name]  from 18 mg BID to 13.5 mg BID and feels it's better with less general tension in his body.  Lately energy better but not right after reducing the med. No worsening of tongue movements which come and go. Depression wife says the weakness he feels is psychological bc likes to sleep a lot.  Not sure how long it's been going on.  To bed 7:30 and gets up about 830.  Walks and then back to bed until about 1130.  Sleeps some.  Chronic tiredness.   Still takes Zoloft  50.   Nervousness is there in his body but not worrying about anything.   pLan no med changes  10/06/2021 phone call asking to increase [name]  to 12 mg twice daily.  That  dosage was changed as requested.  10/15/2021 phone call from patient's wife Neil [name] stating bupropion  ER 100 mg every morning made him too hyper and nervous and he stopped it.  10/21/2021 phone call from wife stating with the change in [name]  dose the wife thinks his TD symptoms are worse.  On 12 mg twice daily versus 9 mg tablets 1-1/2 twice daily.  So the 9 mg dose was sent back in.  12/03/2021 appointment with the following noted: Taking [name]  9 mg TID, sertraline  50, no Wellbutrin . Fair.  Tiredness not as pronounced as it was.  Not sure why. Not sure about depression, I have something.  Can't describe it but not worrying a lot.  Has had a number of stressors.  Nerves on edge at times.  Trouble resting but still sleeps a lot.  It comes and goes.  Sleeps 10 hours. Not that productive and doesn't do much.  Enjoyed son and his family for Christmas. TD better not gone.  Worse under stress.   Not going to church DT fear of Covid and history of heart operation. Plan: Increase  sertraline  to 75 mg bc of nerves on edge.    02/09/22 appt noted: Increase apptetite and  wt from 145 to 159#.  In the last couple of months. Less anxiety with nerves less on edge. [name]  workig fair but tongue still moves. Depression better not gone. Energy better but fair. Sleep with benadryl 4 nightly. Plan: continue sertraline  75 and [name]  9 mg TID-QID  TC 03/06/22  4:09 PM Note If his anxiety persists into next week he can increase sertraline  to 100 mg daily for anxiety.     06/08/22 appt noted: Continues sertraline  100 mg working better, [name]  9 mg 4 daily Tolerating meds.  Less SE  now than in the past but slow gait with short steps at times. Thinks tongue movements better with more [name] . Anxiety better controlled.  Depression not too bad. Went to church first time Sunday and that helped.  Had avoided bc Covid fears. Plans to attend person on Sunday mornings. Otherwise health is OK. Son helpful  but fixated on rapture. Plan: Continue [name]  9 mg tablets 1 QID and sertraline  100   10/13/22 appt noted: Continues sertraline  100 mg working better, [name]  9 mg 4 daily Fair  with walking as the only real problem.  Sometimes short steps and not much endurance.  Has had PT in the past. Tongue movmements generally controlled.  When cut back to [name]  TID he complained of more tongue movements. He also tried stopping it all together also. 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  He has not really tried TID recommend trial to see if gait is better.    02/10/23 appt noted: Dropped [name]  to TID and worsening TD including jaw and tongue movementso increased back to QID.  Took a while to notice any difference, but better now. Overall now doing better but ill at ease at times and will call for [facility] almost unconsciously with some anxiety. It comes and goes daily.  There most of the time and can occur without triggers.   Not much depression.  Denies sign avoidance.   Sleep is better lately.  Taking melatonin but sleep is irregular.  Not drowsy in the day.  Some napping.  03/02/23 TC pt complaining of vague feeling discomfort since here. MD resp:  This sounds the same as his complaints at the visit 2/28 Overall now doing better but ill at ease at times and will call for [facility] almost unconsciously with some anxiety. It comes and goes daily. There most of the time and can occur without triggers.    We raised the dose to 150 mg sertraline  at that time.  Give it 2 more weeks bc most of the benefit will probably happen in the next 2 weeks.  If he is not improving by then we will reduce sertraline  to 1 daily.    CMA TC with wife: Called wife to let her know recommendations and she had not increased dose from 100 to 150 mg. She thought one tablet was 150 mg.  I told her to cut pill in half and  give 1 whole pill and 1 half pill. Asked that she let us  know if 2 weeks how he was doing, sooner if needed.       05/12/23 appt noted:  seen in office Meds : incr sertraline  to 150 mg mid March Better since of well being since increase sertaline.   No SE noted. More generally anxious without reason when goes to bed.  Sleep 10 to 8.   Feels he needs current dose Austed 9 mg QID bc uncontrollable tongue movements with less.  They are manageable with this dose. No par.  No fear.  Satisfied with current meds.  09/15/23 appt noted: Meds: sertraline  150, [name]  9 QID SE slow gait Been fair overall.  Anxiety is a little better.  Not dep. Not much trouble with tongue movments anymore. Plan: reduce [name]  by switching to XR 30 mg  to see if gait is freer.  11/16/23 appt noted: 2-3 weeks after switch movements worsened but able to stay on [name]  XR 30 mg daily. Tongue movements not gone but wax and wane and manageable. Feels looser in legs and gait better with less [name]  but took until recently to get better.   Thinks hands stiffer from [name]  also. Wants to try reducing [name]  again if possible. Meds: sertraline  150, [name]  XR 30 mg daily. Plan: Per his request reduce [name]  by switching to XR 24 mg to see if gait is freer.  11/22/23 TC: Neil [name] called in to report that Neil [name] took the increased dose of [name]  and it is just not working. His tongue and jaw are moving too much and its uncomfortable and he cannot sleep or rest. Should they reduce back down to 1 pll per day?  MD resp:  To be clear, we did not increase the dose of the [name] .  We reduced the dose of the [name]  from XR 30 mg XR 24 mg per his request.  He was aware that this could lead  to worsening of his tongue movements which is apparently what happened.  He may have some 30 mg tablets left and if so he should use them.  If needed please send an [name]  1 month prescription of the XR 30 mg tablets into the appropriate  pharmacy.     01/18/24 appt noted: Psych med: [name]  XR 30 mg , sertraline  50 mg 3 daily. Did notice easier gait and less stiffness with less [name]  XR 24 mg daily but tongue movements got worse and went back up in the dose. XR 30 mg daily worked awhile and then more tongue and jaw movements.  So then started cutting 9 mg [name]  into quarters and then started taking 9 mg 1/4 QID in addition to XR 30 mg and helped the tongue movements.  Took awhile to help.  Made his gait a little stiffer but not a whole lot worse.  Times in afternoon when feels ill at ease and that will make tongue movements worse.  Feeling in his body and mind.  Not sure it's anxiety.  Will get real dependent when it happens.   Will call out to her.  Can happen often at night if can't go to sleep. No fearful thoughts.  Maybe some dep at times.  And some anxiety at times.  Plan no changes except trial [name]  9 mg   03/22/24 appt noted: Med:  Sertraline  150, [name]  XR 36 mg only, gingko biloba Gingko 2 BID so taking 1 BID TD much better.  Will flare under stress but other wise satisfied with [name]  XR. Had a fall but is OK. No much dep.  Some chronic anxiety is greater. Generally tense.  No longer sure sertraline  is helping.   GI px with Gingko 2 60 mg BID so taking 1 BID.  Will try to inccrease again soon. Overall wife thinks anxiety is at baseline with recent stress storm damage. Wonders abt reducing [name]  again. Plan no changes  07/31/24 appt noted:  Med:  Sertraline  200 since mid June, [name]  XR 36 mg only, gingko biloba Gingko 2 BID so taking 1 BID.  Thinks latter helps.   Better than in the past.  Less anxious and depressed and more confidence. Increase sertaline might have helped. Tends to have more anxiety than dep.   General anxiety can attach itself to almost anything.   [name]  XR seems better.  No SE of significance.  TD well controlled. Wife say he is better.   Neil [name] is helpful cooking, cleaning,  yard, and shops.  No AH.  Not much paranoia evident.    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  Son Neil [name] living with them.  Become more committed [place].  He has schizophrenia.   Past Psychiatric Medication Trials: Mirtazapine ,  olanzapine  5 EPS, perphenazine  started TD with mouth px,  Saphris 5 mg and stopped due to EPS complaints and feeling funny Caplyta side effects risperidone 2 tremors  Buspirone ,   amitriptyline 25,  sertraline  50 Wellbutrin  SR 100 nervous Hydroxyzine  10-20 Clonazepam  0.5 TID dizzy and sleepy Ativan  0.5 mg QID Ingrezza    [name]  18 mg BID or XR 30  Review of Systems:  Review of Systems  Constitutional:  Negative for appetite change and fatigue.  Cardiovascular:  Negative for palpitations.  Gastrointestinal:  Negative for constipation.  Musculoskeletal:  Positive for gait problem.  Neurological:  Negative for dizziness, tremors and syncope.       No falls.  Slow gait.   Psychiatric/Behavioral:  Negative for agitation, behavioral  problems, confusion, decreased concentration, dysphoric mood, hallucinations, self-injury and sleep disturbance. The patient is nervous/anxious. The patient is not hyperactive.        Sleepy  CABG 07/19/20  Medications: I have reviewed the patient's current medications.  Current Outpatient Medications  Medication Sig Dispense Refill   acetaminophen  (TYLENOL ) 500 MG tablet Take 500 mg by mouth daily.     Bempedoic Acid-Ezetimibe (NEXLIZET ) 180-10 MG TABS Take 1 tablet by mouth daily. 30 tablet 11   clopidogrel  (PLAVIX ) 75 MG tablet TAKE 1 TABLET BY MOUTH EVERY DAY 90 tablet 3   MELATONIN PO Take 1 tablet by mouth at bedtime.     metoprolol  succinate (TOPROL -XL) 25 MG 24 hr tablet TAKE 1 TABLET (25 MG TOTAL) BY MOUTH DAILY. 90 tablet 2   Multiple Vitamin (MULTIVITAMIN WITH MINERALS) TABS tablet Take 2 tablets by mouth daily. 50 +     sennosides-docusate sodium  (SENOKOT-S) 8.6-50 MG tablet Take 1 tablet by mouth daily  with supper.     silodosin (RAPAFLO) 4 MG CAPS capsule Take 4 mg by mouth daily with breakfast.     VITAMIN D PO Take 1 capsule by mouth daily.     Deutetrabenazine  ER ([name]  XR) 36 MG TB24 Take 1 tablet by mouth daily. 30 tablet 5   sertraline  (ZOLOFT ) 100 MG tablet Take 2 tablets (200 mg total) by mouth daily. 180 tablet 1   No current facility-administered medications for this visit.    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   LIMA to LAD, Sequential SVG to OM1-OM3, SVG to RCA, EVH via left thigh   Schizophrenia (HCC)    Severe aortic stenosis     Family History  Problem Relation Age of Onset   Hypertension Mother    Heart attack Father    Hypertension Sister    Hypertension Brother     Social History   Socioeconomic History   Marital status: Married    Spouse name: Not on file   Number of children: Not on file   Years of education: Not on file   Highest education level: Not on file  Occupational History   Not on file  Tobacco Use   Smoking status: Never   Smokeless tobacco: Never  Vaping Use   Vaping status: Never Used  Substance and Sexual Activity   Alcohol use: No   Drug use: Never   Sexual activity: Not on file  Other Topics Concern   Not on file  Social History Narrative   Not on file   Social Drivers of Health    Financial Resource Strain: Not on file  Food Insecurity: Not on file  Transportation Needs: Not on file  Physical Activity: Not on file  Stress: Not on file  Social Connections: Not on  file  Intimate Partner Violence: Not on file    Past Medical History, Surgical history, Social history, and Family history were reviewed and updated as appropriate.   Please see review of systems for further details on the patient's review from today.   Objective:   Physical Exam:  There were no vitals taken for this visit.  Physical Exam HENT:     Right Ear: Decreased hearing noted.  Neurological:     Mental Status: He is alert and oriented to person, place, and time.     Cranial Nerves: No dysarthria.     Motor: Weakness present.     Comments: walker  Psychiatric:        Attention and Perception: Attention and perception normal.        Mood and Affect: Mood is anxious. Mood is not depressed.        Speech: Speech normal.        Behavior: Behavior is cooperative.        Thought Content: Thought content normal. Thought content is not paranoid or delusional. Thought content does not include homicidal or suicidal ideation. Thought content does not include suicidal plan.        Cognition and Memory: Cognition and memory normal.        Judgment: Judgment normal.     Comments: Insight intact     Lab Review:     Component Value Date/Time   NA 132 (L) 07/24/2020 0400   NA 130 (L) 07/09/2020 1508   K 3.4 (L) 07/24/2020 0400   CL 96 (L) 07/24/2020 0400   CO2 24 07/24/2020 0400   GLUCOSE 115 (H) 07/24/2020 0400   BUN 20 07/24/2020 0400   BUN 16 07/09/2020 1508   CREATININE 0.74 07/24/2020 0400   CALCIUM  7.6 (L) 07/24/2020 0400   PROT 6.6 07/17/2020 0920   PROT 6.6 07/09/2020 1508   ALBUMIN  3.8 07/17/2020 0920   ALBUMIN  4.4 07/09/2020 1508   AST 18 07/17/2020 0920   ALT 22 07/17/2020 0920   ALKPHOS 54 07/17/2020 0920   BILITOT 0.7 07/17/2020 0920   BILITOT 0.4 07/09/2020 1508    GFRNONAA >60 07/24/2020 0400   GFRAA >60 07/24/2020 0400       Component Value Date/Time   WBC 14.0 (H) 07/23/2020 0645   RBC 3.78 (L) 07/23/2020 0645   HGB 11.9 (L) 07/23/2020 0645   HGB 13.5 06/18/2020 1056   HCT 35.1 (L) 07/23/2020 0645   HCT 38.6 06/18/2020 1056   PLT 136 (L) 07/23/2020 0645   PLT 243 06/18/2020 1056   MCV 92.9 07/23/2020 0645   MCV 90 06/18/2020 1056   MCH 31.5 07/23/2020 0645   MCHC 33.9 07/23/2020 0645   RDW 13.7 07/23/2020 0645   RDW 13.0 06/18/2020 1056   LYMPHSABS 1.7 06/18/2020 1056   EOSABS 0.4 06/18/2020 1056   BASOSABS 0.0 06/18/2020 1056   I will  No results found for: POCLITH, LITHIUM   No results found for: PHENYTOIN, PHENOBARB, VALPROATE, CBMZ   AIMS    Flowsheet Row Office Visit from 06/04/2020 in [facility]  AIMS Total Score 22     .res Assessment: Plan:    Neil [name] was seen today for follow-up, depression, anxiety and medication reaction.  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  Generalized  anxiety disorder -     sertraline  (ZOLOFT ) 100 MG tablet; Take 2 tablets (200 mg total) by mouth daily.  Tardive dyskinesia -     Deutetrabenazine  ER ([name]  XR) 36 MG TB24; Take 1 tablet by mouth daily.  Fatigue, unspecified type  Insomnia due to mental condition    35 min -face to face time with patient was spent on counseling and coordination of care. We discussed the following.   We discussed Patient has history of severe recurrent major depression with psychotic features.  There are also elements of OCD.   His paranoid is improved.    He restarted Benadryl. Cautioned about it.  Taking melatonin.  Disc sleep hygiene.  His son has schizophrenia so there may be a genetic predisposition to psychosis.  I  had suspected  that he has paranoid schizophrenia that has been previously diagnosed as depression with psychosis but in either case his sx remitted without currrent use of antipsychotic.  Both son and pt have history of slowness of gait, reduced arm swing , short steps that predate any history of antipsychotic.  This is ongoing and ? Worse with [name] .  He does not have concerned.  He has benefited with reduction in anxiety from the use of sertraline  . Still has frequent anxiey daily abut is better Again his depression is better with the sertraline .  Less shame but that may be mild paranoia or anxiety and not depression. Some Better with increase sertraline  to 200 mg for anxiety      Disc SE in detail and SSRI withdrawal sx. Call if a problem  Discussed his tendency to change his medicines on his own.  There is significant risk in doing so.    Gingko biloba 120 mg twice daily.  Make sure the dose of what you buy is correct as it can help [name]  to work better and should not cause side effects. Continue [name]  XR to 36 mg daily and stop IR versions. No med changes  TD is better manageable and gone.  Mildy Parkinsonian..  FU 4-6  mos   Lorene Macintosh, MD, DFAPA     Future Appointments  Date Time Provider Department Center  09/11/2024  4:00 PM [place], Vinie BROCKS, MD [facility]        No orders of the defined types were placed in this encounter.      -------------------------------

## 2024-09-01 ENCOUNTER — Ambulatory Visit: Payer: Self-pay | Admitting: Internal Medicine

## 2024-09-02 ENCOUNTER — Other Ambulatory Visit: Payer: Self-pay | Admitting: Internal Medicine

## 2024-09-06 ENCOUNTER — Other Ambulatory Visit: Payer: Self-pay | Admitting: Internal Medicine

## 2024-09-11 ENCOUNTER — Encounter: Payer: Self-pay | Admitting: Internal Medicine

## 2024-09-11 ENCOUNTER — Ambulatory Visit: Attending: Internal Medicine | Admitting: Internal Medicine

## 2024-09-11 VITALS — BP 105/69 | HR 55 | Ht 64.5 in | Wt 156.0 lb

## 2024-09-11 DIAGNOSIS — E785 Hyperlipidemia, unspecified: Secondary | ICD-10-CM

## 2024-09-11 DIAGNOSIS — I10 Essential (primary) hypertension: Secondary | ICD-10-CM | POA: Diagnosis not present

## 2024-09-11 DIAGNOSIS — Z951 Presence of aortocoronary bypass graft: Secondary | ICD-10-CM

## 2024-09-11 DIAGNOSIS — Z953 Presence of xenogenic heart valve: Secondary | ICD-10-CM

## 2024-09-11 DIAGNOSIS — M791 Myalgia, unspecified site: Secondary | ICD-10-CM

## 2024-09-11 DIAGNOSIS — T466X5D Adverse effect of antihyperlipidemic and antiarteriosclerotic drugs, subsequent encounter: Secondary | ICD-10-CM | POA: Diagnosis not present

## 2024-09-11 NOTE — Progress Notes (Signed)
 OFFICE CONSULT NOTE  Chief Complaint:  Follow-up severe aortic stenosis  Primary Care Physician: Seabron Lenis, MD  HPI:  Neil [name] is a 83 y.o. male who is being seen today for the evaluation of severe aortic stenosis at the request of Seabron Lenis, MD.  This is a pleasant 83 year old male kindly referred for evaluation and management of severe aortic stenosis.  Apparently he has been followed for an abnormal heart murmur which she says he is had for a long time, possibly since he was a child.  He recalls having had an echo with [facility] about 5 or 6 years ago and was told that he had aortic valve disease but clearly has not had follow-up closely until recently.  His PCP noted a change in the quality of his murmur and a repeat echo was ordered.  This was performed at [facility] with outside reading, and I personally reviewed the report but did not have access to the images.  This demonstrated an LVEF of 60 to 65% with mild LVH, normal left atrial size, moderate calcification of the mitral valve leaflets and minimal mitral regurgitation without mitral stenosis.  The aortic valve was moderately calcified and there was severe aortic stenosis.  The mean gradient across the aortic valve was 64 mmHg and a peak gradient of 90 mmHg.  The aortic root diameter was normal.  This was discussed with the patient and he was felt to be asymptomatic, flatly denying any chest pain, worsening shortness of breath with exertion, presyncope or syncopal episodes.  10/25/2020  Neil [name] returns today for follow-up.  This is the first time I have seen him since his initial office visit which time he was found to have critical aortic stenosis and sent fairly urgently for cardiac catheterization.  He subsequently underwent bioprosthetic AVR with Edwards Inspriris Resilia pericardial tissue valve (21 mm) and aortic root enlargement using bovine pericardial patch as well as CABG x4 (LIMA to LAD, SVG to distal right coronary,  SVG to OM1 and sequential SVG to OM 3).  While this went well there was some postoperative bleeding and he was taken back to the operating room later for reexploration which demonstrated diffuse coagulopathy and bleeding from the chest wall. He was ultimately discharged.  In follow-up he was noted to have complaints of increasing lower extremity edema and dyspnea.  Chest x-ray was performed and demonstrated interval development of moderate left pleural effusion.  He subsequently underwent thoracentesis on the left on August 26 with marked improvement in his symptoms.  No recurrence was noted thereafter.  He was last seen by Dr. Dusty on September 23, 2020 and felt to be doing well.  He had deferred management of several medications and a repeat echo to me.  This is following an echo postoperatively which showed some reduction in LVEF down to 45 to 50% with incoordinate septal motion.  Is possible this degree of heart failure may have been related to his postoperative pleural effusion.  His wife reports he has been doing well with physical therapy.  He is now walking without a walker.  The only other concern was that he had low blood pressure.  His PCP took him off of his lisinopril  due to this.  10/26/2022  Neil [name] is seen today in follow-up.  Overall he seems to be doing well and has recovered from aortic root and valve surgery as well as bypass grafting in 2021.  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  He had some difficulty in expressing himself today.  Cardiac standpoint he denies any chest pain or worsening shortness of breath.  He had a repeat echo in April of this year which showed no abnormalities of the aortic valve and normal LV function.  09/11/2024  Neil [name] is seen today in follow-up.  He is doing fairly well with regards to his aortic valve replacement.  He denies any shortness of breath or  chest pain.  His wife says that he remains fairly active.  In general his mood control has been pretty good but he did have significant tardive dyskinesia.  He is now on a combination of medications including ginkgo biloba which apparently has helped with some of his TD symptoms.  His last echo was in 2023 which showed a stable normal gradient across the aortic valve.  He denies any anginal symptoms.  Finally, he apparently was intolerant to statins.  We ultimately switched him to Nexlizet .  He seems to tolerate this well.  He had repeat lipids recently showing total cholesterol 135, triglycerides 142, HDL 33 and LDL 77.  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   LIMA to LAD, Sequential SVG to OM1-OM3, SVG to RCA, EVH via left thigh   Schizophrenia (HCC)    Severe aortic stenosis     Past Surgical History:  Procedure Laterality Date   AORTIC ROOT ENLARGEMENT N/A 07/19/2020   Procedure: AORTIC ROOT ENLARGEMENT USING 6CM x 8CM PERI-GUARD BOVINE PERICARDIUM PATCH;  Surgeon: Dusty Sudie DEL, MD;  Location: [facility] OR;  Service: Open Heart Surgery;  Laterality: N/A;   AORTIC VALVE REPLACEMENT N/A 07/19/2020   Procedure: AORTIC VALVE REPLACEMENT (AVR) USING INSPIRIS RESILIA AORTIC VALVE;  Surgeon: Dusty Sudie DEL, MD;  [facility] OR;  Service: Open Heart Surgery;  Laterality: N/A;   CORONARY ARTERY BYPASS GRAFT N/A 07/19/2020   Procedure: CORONARY ARTERY BYPASS GRAFTING (CABG), ON PUMP, TIMES FOUR, USING LEFT INTERNAL MAMMARY ARTERY AND ENDOSCOPICALLY HARVESTED LEFT GREATER SAPHENOUS VEIN;  Surgeon: Dusty Sudie DEL, MD;  Location: [facility] OR;  Service: Open Heart Surgery;  Laterality: N/A;   EXPLORATION POST OPERATIVE OPEN HEART N/A 07/19/2020   Procedure: EXPLORATION  POST OPERATIVE OPEN HEART;  Surgeon: Dusty Sudie DEL, MD;  Location: [facility] OR;  Service: Open Heart Surgery;  Laterality: N/A;   EYE SURGERY     Bialteral lower eye lids   FOOT SURGERY Left    IR THORACENTESIS ASP PLEURAL SPACE W/IMG GUIDE  08/08/2020   LIPOMA EXCISION     RIGHT AND LEFT HEART CATH N/A 06/26/2020   Procedure: RIGHT AND LEFT HEART CATH;  Surgeon: Wonda Sharper, MD;  Location: [facility] INVASIVE CV LAB;  Service: Cardiovascular;  Laterality: N/A;   TEE WITHOUT CARDIOVERSION N/A 07/19/2020   Procedure: TRANSESOPHAGEAL ECHOCARDIOGRAM (TEE);  Surgeon: Dusty Sudie DEL, MD;  Location: [facility] OR;  Service: Open Heart Surgery;  Laterality: N/A;    FAMHx:  Family History  Problem Relation Age of Onset   Hypertension Mother    Heart attack Father    Hypertension Sister    Hypertension Brother     SOCHx:   reports  that he has never smoked. He has never used smokeless tobacco. He reports that he does not drink alcohol and does not use drugs.  ALLERGIES:  Allergies  Allergen Reactions   Amitriptyline Hcl Other (See Comments)   Aspirin Swelling and Other (See Comments)    Tongue swelling    Elemental Sulfur Other (See Comments)    Reaction:  Unknown    Perphenazine  Other (See Comments)   Tetanus Toxoid-Containing Vaccines Swelling and Other (See Comments)    Arm swelling   Tetanus-Diphtheria Toxoids Td Other (See Comments)    ROS: Pertinent items noted in HPI and remainder of comprehensive ROS otherwise negative.  HOME MEDS: Current Outpatient Medications on File Prior to Visit  Medication Sig Dispense Refill   acetaminophen  (TYLENOL ) 500 MG tablet Take 500 mg by mouth daily.     Bempedoic Acid-Ezetimibe (NEXLIZET ) 180-10 MG TABS Take 1 tablet by mouth daily. 30 tablet 11   clopidogrel  (PLAVIX ) 75 MG tablet TAKE 1 TABLET BY MOUTH EVERY DAY 90 tablet 0   Deutetrabenazine  ER ([name]  XR) 36 MG TB24 Take 1 tablet by mouth daily. 30 tablet 5   MELATONIN PO Take 1 tablet by mouth at  bedtime.     metoprolol  succinate (TOPROL -XL) 25 MG 24 hr tablet TAKE 1 TABLET (25 MG TOTAL) BY MOUTH DAILY. 90 tablet 1   Multiple Vitamin (MULTIVITAMIN WITH MINERALS) TABS tablet Take 2 tablets by mouth daily. 50 +     sennosides-docusate sodium  (SENOKOT-S) 8.6-50 MG tablet Take 1 tablet by mouth daily with supper.     sertraline  (ZOLOFT ) 100 MG tablet Take 2 tablets (200 mg total) by mouth daily. 180 tablet 1   silodosin (RAPAFLO) 4 MG CAPS capsule Take 4 mg by mouth daily with breakfast.     VITAMIN D PO Take 1 capsule by mouth daily.     No current facility-administered medications on file prior to visit.    LABS/IMAGING: No results found for this or any previous visit (from the past 48 hours). No results found.  LIPID PANEL:    Component Value Date/Time   CHOL 148 01/23/2021 1049   TRIG 50 01/23/2021 1049   HDL 58 01/23/2021 1049   CHOLHDL 2.6 01/23/2021 1049   LDLCALC 79 01/23/2021 1049    WEIGHTS: Wt Readings from Last 3 Encounters:  09/11/24 156 lb (70.8 kg)  12/21/23 160 lb (72.6 kg)  10/22/23 156 lb (70.8 kg)    VITALS: BP 105/69 (Cuff Size: Normal)   Pulse (!) 55   Ht 5' 4.5 (1.638 m)   Wt 156 lb (70.8 kg)   SpO2 95%   BMI 26.36 kg/m   EXAM: General appearance: alert and no distress Neck: no carotid bruit, no JVD and thyroid  not enlarged, symmetric, no tenderness/mass/nodules Lungs: clear to auscultation bilaterally Heart: regular rate and rhythm, S1: normal, S2: decreased intensity and systolic murmur: systolic ejection 3/6, harsh at 2nd right intercostal space Abdomen: soft, non-tender; bowel sounds normal; no masses,  no organomegaly Extremities: extremities normal, atraumatic, no cyanosis or edema Pulses: 2+ and symmetric Skin: Skin color, texture, turgor normal. No rashes or lesions Neurologic: Grossly normal Psych: Pleasant  EKG: EKG Interpretation Date/Time:  Monday September 11 2024 16:17:17 EDT Ventricular Rate:  53 PR Interval:  144 QRS  Duration:  106 QT Interval:  450 QTC Calculation: 422 R Axis:   -26  Text Interpretation: Sinus bradycardia Minimal voltage criteria for LVH, may be normal variant ( Cornell product ) Nonspecific ST abnormality When compared  with ECG of 22-Oct-2023 13:39, No significant change was found Confirmed by Mona Kent [phone]) on 09/11/2024 4:34:36 PM    ASSESSMENT: Severe asymptomatic aortic stenosis-mean gradient 64 mmHg Status post bioprosthetic AVR (Edwards Inspiris Resilia 21 mm), aortic root patch enlargment (07/2020) Status post CABG x4 (LIMA to LAD, SVG to distal RCA, SVG to OM1 and sequential SVG to OM 3) - (07/2020) Dyslipidemia, goal LDL less than 70 Depression with TD  PLAN: 1.   Mr. Shughart seems to be doing well now 4 years out from bypass surgery and aortic valve replacement with patch enlargement.  His last echo in 2023 showed normal valve gradient.  He remains overall asymptomatic.  Will plan a repeat echo next year prior to his follow-up visit.  He was intolerant to statins and ultimately switched to Nexlizet .  He seems to tolerate this well.  No changes to his medicines today.  Follow-up with me annually or sooner as necessary.  Kent KYM Mona, MD, [facility], FNLA, FACP  [place]  [facility]  Medical Director of the [facility] Lipid Disorders &  Cardiovascular Risk Reduction Clinic Diplomate of the American Board of Clinical Lipidology Attending Cardiologist  Direct Dial: [phone]  Fax: [phone]  Website:  [email or web address] Mona 09/11/2024, 4:34 PM

## 2024-09-11 NOTE — Patient Instructions (Signed)
 Medication Instructions:  NO CHANGES  *If you need a refill on your cardiac medications before your next appointment, please call your pharmacy*   Testing/Procedures: Your physician has requested that you have an echocardiogram in 1 year. Echocardiography is a painless test that uses sound waves to create images of your heart. It provides your doctor with information about the size and shape of your heart and how well your heart's chambers and valves are working. This procedure takes approximately one hour. There are no restrictions for this procedure. Please do NOT wear cologne, perfume, aftershave, or lotions (deodorant is allowed). Please arrive 15 minutes prior to your appointment time.  Please note: We ask at that you not bring children with you during ultrasound (echo/ vascular) testing. Due to room size and safety concerns, children are not allowed in the ultrasound rooms during exams. Our front office staff cannot provide observation of children in our lobby area while testing is being conducted. An adult accompanying a patient to their appointment will only be allowed in the ultrasound room at the discretion of the ultrasound technician under special circumstances. We apologize for any inconvenience.    Follow-Up: At [facility], you and your health needs are our priority.  As part of our continuing mission to provide you with exceptional heart care, our providers are all part of one team.  This team includes your primary Cardiologist (physician) and Advanced Practice Providers or APPs (Physician Assistants and Nurse Practitioners) who all work together to provide you with the care you need, when you need it.  Your next appointment:   12 months  We recommend signing up for the patient portal called MyChart.  Sign up information is provided on this After Visit Summary.  MyChart is used to connect with patients for Virtual Visits (Telemedicine).  Patients are able to view  lab/test results, encounter notes, upcoming appointments, etc.  Non-urgent messages can be sent to your provider as well.   To learn more about what you can do with MyChart, go to [email or web address].

## 2024-10-17 ENCOUNTER — Telehealth: Payer: Self-pay | Admitting: Internal Medicine

## 2024-10-17 NOTE — Telephone Encounter (Signed)
 Wife Joya) returned RN's call and stated best time to reach her would be after 4:00 pm

## 2024-10-17 NOTE — Telephone Encounter (Signed)
 Wife Joya) wants a call back to address patient seeing red spots when he's asleep (patient stated they appear as though he is dreaming) which disappear when he wakes up.

## 2024-10-17 NOTE — Telephone Encounter (Signed)
 Received call back from patient's wife Dr.Hilty's advice given.

## 2024-10-17 NOTE — Telephone Encounter (Signed)
 Left message to call back.

## 2024-10-17 NOTE — Telephone Encounter (Signed)
 Spoke to patient and his wife.Stated he has been seeing red spots for the past several months.He sees these spots at night.No headache.No dizziness.He wanted Dr.Hilty's advice.

## 2024-10-17 NOTE — Telephone Encounter (Signed)
 Called patient left message on personal voice mail to call back.

## 2024-12-05 ENCOUNTER — Other Ambulatory Visit: Payer: Self-pay | Admitting: Internal Medicine

## 2024-12-05 MED ORDER — NEXLIZET 180-10 MG PO TABS: 1.0000 | ORAL_TABLET | Freq: Every day | ORAL | 8 refills | Status: AC

## 2024-12-09 ENCOUNTER — Other Ambulatory Visit: Payer: Self-pay | Admitting: Internal Medicine

## 2024-12-11 ENCOUNTER — Telehealth: Payer: Self-pay | Admitting: Internal Medicine

## 2024-12-11 MED ORDER — CLOPIDOGREL BISULFATE 75 MG PO TABS: 75.0000 mg | ORAL_TABLET | Freq: Every day | ORAL | 3 refills | Status: AC

## 2024-12-11 NOTE — Telephone Encounter (Signed)
" °*  STAT* If patient is at the pharmacy, call can be transferred to refill team.   1. Which medications need to be refilled? (please list name of each medication and dose if known)  clopidogrel  (PLAVIX ) 75 MG tablet   2. Would you like to learn more about the convenience, safety, & potential cost savings by using the [facility]? no   3. Are you open to using the [facility] (Type [facility]. no   4. Which pharmacy/location (including street and city if local pharmacy) is medication to be sent to? [id number] [place], [place] - [address]      5. Do they need a 30 day or 90 day supply? 90 day  "

## 2024-12-11 NOTE — Telephone Encounter (Signed)
 Requested Prescriptions   Signed Prescriptions Disp Refills   clopidogrel  (PLAVIX ) 75 MG tablet 90 tablet 3    Sig: Take 1 tablet (75 mg total) by mouth daily.    Authorizing Provider: MONA VINIE BROCKS    Ordering User: Aizley Stenseth  C

## 2024-12-28 ENCOUNTER — Telehealth: Payer: Self-pay | Admitting: Internal Medicine

## 2024-12-28 NOTE — Telephone Encounter (Signed)
 Spoke with Pts wife Rock. They are ok with sending to Med assist team.

## 2024-12-28 NOTE — Telephone Encounter (Signed)
 Pt c/o medication issue:  1. Name of Medication:   Bempedoic Acid-Ezetimibe (NEXLIZET ) 180-10 MG TABS    2. How are you currently taking this medication (dosage and times per day)?  Take 1 tablet by mouth daily.      3. Are you having a reaction (difficulty breathing--STAT)? No  4. What is your medication issue? Pt's spouse is requesting a callback regarding this medication costing $400.00 plus. She stated they'd like to discuss other options. Please advise

## 2024-12-29 ENCOUNTER — Telehealth: Payer: Self-pay | Admitting: Pharmacy Technician

## 2024-12-29 ENCOUNTER — Other Ambulatory Visit (HOSPITAL_COMMUNITY): Payer: Self-pay

## 2024-12-29 NOTE — Telephone Encounter (Signed)
 Grant approved for nexlizet . Information provided to pharmacy and message left for patient

## 2024-12-29 NOTE — Telephone Encounter (Signed)
 Patient Advocate Encounter   The patient was approved for a [employer] grant that will help cover the cost of NEXLIZET  Total amount awarded, 2500.  Effective: 11/29/24 - 11/28/25   APW:[id number] ERW:[id number] Hmnle:[id number] PI:[id number] [employer] ID: [id number]   Pharmacy provided with approval and processing information. Spoke to wife via telephone

## 2025-01-31 ENCOUNTER — Telehealth: Admitting: Psychiatry
